# Patient Record
Sex: Female | Born: 1935 | Race: White | Hispanic: No | Smoking: Never smoker
Health system: Southern US, Community
[De-identification: ages and names within clinical notes are randomized; demographics above are authoritative.]

## PROBLEM LIST (undated history)

## (undated) DIAGNOSIS — I4891 Unspecified atrial fibrillation: Secondary | ICD-10-CM

## (undated) DIAGNOSIS — M1990 Unspecified osteoarthritis, unspecified site: Secondary | ICD-10-CM

## (undated) DIAGNOSIS — I499 Cardiac arrhythmia, unspecified: Secondary | ICD-10-CM

## (undated) DIAGNOSIS — Z96649 Presence of unspecified artificial hip joint: Secondary | ICD-10-CM

## (undated) DIAGNOSIS — E7800 Pure hypercholesterolemia, unspecified: Secondary | ICD-10-CM

## (undated) DIAGNOSIS — I890 Lymphedema, not elsewhere classified: Secondary | ICD-10-CM

## (undated) DIAGNOSIS — I639 Cerebral infarction, unspecified: Secondary | ICD-10-CM

## (undated) DIAGNOSIS — I509 Heart failure, unspecified: Secondary | ICD-10-CM

## (undated) DIAGNOSIS — B029 Zoster without complications: Secondary | ICD-10-CM

## (undated) DIAGNOSIS — I10 Essential (primary) hypertension: Secondary | ICD-10-CM

## (undated) HISTORY — DX: Essential (primary) hypertension: I10

## (undated) HISTORY — DX: Pure hypercholesterolemia, unspecified: E78.00

## (undated) HISTORY — DX: Cerebral infarction, unspecified: I63.9

## (undated) HISTORY — DX: Cardiac arrhythmia, unspecified: I49.9

## (undated) HISTORY — DX: Zoster without complications: B02.9

## (undated) HISTORY — DX: Unspecified osteoarthritis, unspecified site: M19.90

## (undated) HISTORY — DX: Lymphedema, not elsewhere classified: I89.0

---

## 2014-04-14 ENCOUNTER — Emergency Department: Payer: Self-pay

## 2014-04-14 LAB — BASIC METABOLIC PANEL
Anion Gap: 7 (ref 7–16)
BUN: 19 mg/dL — AB (ref 7–18)
CO2: 30 mmol/L (ref 21–32)
Calcium, Total: 9.2 mg/dL (ref 8.5–10.1)
Chloride: 102 mmol/L (ref 98–107)
Creatinine: 1.2 mg/dL (ref 0.60–1.30)
EGFR (African American): 47 — ABNORMAL LOW
EGFR (Non-African Amer.): 41 — ABNORMAL LOW
GLUCOSE: 131 mg/dL — AB (ref 65–99)
Osmolality: 282 (ref 275–301)
POTASSIUM: 3.7 mmol/L (ref 3.5–5.1)
Sodium: 139 mmol/L (ref 136–145)

## 2014-04-14 LAB — CBC
HCT: 44.8 % (ref 35.0–47.0)
HGB: 14.3 g/dL (ref 12.0–16.0)
MCH: 29 pg (ref 26.0–34.0)
MCHC: 32 g/dL (ref 32.0–36.0)
MCV: 90 fL (ref 80–100)
Platelet: 206 10*3/uL (ref 150–440)
RBC: 4.96 10*6/uL (ref 3.80–5.20)
RDW: 15.1 % — ABNORMAL HIGH (ref 11.5–14.5)
WBC: 7.4 10*3/uL (ref 3.6–11.0)

## 2014-04-14 LAB — TROPONIN I: Troponin-I: <0.02 ng/mL

## 2014-04-14 LAB — PRO B NATRIURETIC PEPTIDE: B-Type Natriuretic Peptide: 950 pg/mL — ABNORMAL HIGH (ref 0–450)

## 2014-04-14 LAB — PROTIME-INR
INR: 2.2
PROTHROMBIN TIME: 23.6 s — AB (ref 11.5–14.7)

## 2014-04-19 ENCOUNTER — Emergency Department: Payer: Self-pay | Admitting: Emergency Medicine

## 2014-04-19 LAB — BASIC METABOLIC PANEL
ANION GAP: 7 (ref 7–16)
BUN: 24 mg/dL — ABNORMAL HIGH (ref 7–18)
Calcium, Total: 9.1 mg/dL (ref 8.5–10.1)
Chloride: 103 mmol/L (ref 98–107)
Co2: 31 mmol/L (ref 21–32)
Creatinine: 1 mg/dL (ref 0.60–1.30)
EGFR (African American): 59 — ABNORMAL LOW
GFR CALC NON AF AMER: 51 — AB
Glucose: 104 mg/dL — ABNORMAL HIGH (ref 65–99)
OSMOLALITY: 286 (ref 275–301)
Potassium: 3.8 mmol/L (ref 3.5–5.1)
SODIUM: 141 mmol/L (ref 136–145)

## 2014-04-19 LAB — CBC
HCT: 44.8 % (ref 35.0–47.0)
HGB: 14.5 g/dL (ref 12.0–16.0)
MCH: 29.2 pg (ref 26.0–34.0)
MCHC: 32.3 g/dL (ref 32.0–36.0)
MCV: 91 fL (ref 80–100)
Platelet: 200 10*3/uL (ref 150–440)
RBC: 4.95 10*6/uL (ref 3.80–5.20)
RDW: 15.3 % — ABNORMAL HIGH (ref 11.5–14.5)
WBC: 7 10*3/uL (ref 3.6–11.0)

## 2014-04-19 LAB — PROTIME-INR
INR: 3
Prothrombin Time: 29.9 secs — ABNORMAL HIGH (ref 11.5–14.7)

## 2014-04-19 LAB — TROPONIN I

## 2014-04-24 ENCOUNTER — Ambulatory Visit: Payer: Self-pay | Admitting: Family Medicine

## 2014-09-29 ENCOUNTER — Observation Stay: Payer: Self-pay | Admitting: Specialist

## 2014-09-29 LAB — PROTIME-INR
INR: 2.4
Prothrombin Time: 25.9 secs — ABNORMAL HIGH (ref 11.5–14.7)

## 2014-09-29 LAB — BASIC METABOLIC PANEL
Anion Gap: 10 (ref 7–16)
BUN: 38 mg/dL — ABNORMAL HIGH (ref 7–18)
CREATININE: 1.32 mg/dL — AB (ref 0.60–1.30)
Calcium, Total: 9.1 mg/dL (ref 8.5–10.1)
Chloride: 100 mmol/L (ref 98–107)
Co2: 28 mmol/L (ref 21–32)
EGFR (Non-African Amer.): 40 — ABNORMAL LOW
GFR CALC AF AMER: 49 — AB
Glucose: 103 mg/dL — ABNORMAL HIGH (ref 65–99)
Osmolality: 285 (ref 275–301)
Potassium: 3.2 mmol/L — ABNORMAL LOW (ref 3.5–5.1)
SODIUM: 138 mmol/L (ref 136–145)

## 2014-09-29 LAB — CBC WITH DIFFERENTIAL/PLATELET
Basophil #: 0 10*3/uL (ref 0.0–0.1)
Basophil %: 0.6 %
EOS ABS: 0.1 10*3/uL (ref 0.0–0.7)
EOS PCT: 0.7 %
HCT: 43.4 % (ref 35.0–47.0)
HGB: 14.1 g/dL (ref 12.0–16.0)
LYMPHS PCT: 29.1 %
Lymphocyte #: 2.5 10*3/uL (ref 1.0–3.6)
MCH: 29.1 pg (ref 26.0–34.0)
MCHC: 32.3 g/dL (ref 32.0–36.0)
MCV: 90 fL (ref 80–100)
Monocyte #: 0.8 x10 3/mm (ref 0.2–0.9)
Monocyte %: 9 %
NEUTROS PCT: 60.6 %
Neutrophil #: 5.1 10*3/uL (ref 1.4–6.5)
Platelet: 233 10*3/uL (ref 150–440)
RBC: 4.83 10*6/uL (ref 3.80–5.20)
RDW: 14.7 % — ABNORMAL HIGH (ref 11.5–14.5)
WBC: 8.4 10*3/uL (ref 3.6–11.0)

## 2014-09-29 LAB — URINALYSIS, COMPLETE
BLOOD: NEGATIVE
Bilirubin,UR: NEGATIVE
Glucose,UR: NEGATIVE mg/dL (ref 0–75)
Leukocyte Esterase: NEGATIVE
NITRITE: POSITIVE
PROTEIN: NEGATIVE
Ph: 6 (ref 4.5–8.0)
RBC,UR: 3 /HPF (ref 0–5)
SPECIFIC GRAVITY: 1.011 (ref 1.003–1.030)

## 2014-09-29 LAB — CK TOTAL AND CKMB (NOT AT ARMC)
CK, TOTAL: 48 U/L (ref 26–192)
CK, Total: 44 U/L (ref 26–192)
CK, Total: 40 U/L (ref 26–192)
CK-MB: 1.7 ng/mL (ref 0.5–3.6)
CK-MB: 2.3 ng/mL (ref 0.5–3.6)
CK-MB: 2.1 ng/mL (ref 0.5–3.6)

## 2014-09-29 LAB — TROPONIN I
Troponin-I: <0.02 ng/mL
Troponin-I: <0.02 ng/mL
Troponin-I: 0.02 ng/mL

## 2014-09-30 LAB — BASIC METABOLIC PANEL
ANION GAP: 2 — AB (ref 7–16)
BUN: 26 mg/dL — AB (ref 7–18)
CHLORIDE: 108 mmol/L — AB (ref 98–107)
Calcium, Total: 8.6 mg/dL (ref 8.5–10.1)
Co2: 30 mmol/L (ref 21–32)
Creatinine: 1.07 mg/dL (ref 0.60–1.30)
GFR CALC NON AF AMER: 52 — AB
GLUCOSE: 83 mg/dL (ref 65–99)
OSMOLALITY: 283 (ref 275–301)
Potassium: 3.1 mmol/L — ABNORMAL LOW (ref 3.5–5.1)
Sodium: 140 mmol/L (ref 136–145)

## 2014-09-30 LAB — CBC WITH DIFFERENTIAL/PLATELET
BASOS ABS: 0 10*3/uL (ref 0.0–0.1)
Basophil %: 0.5 %
EOS ABS: 0.1 10*3/uL (ref 0.0–0.7)
Eosinophil %: 1.2 %
HCT: 38.3 % (ref 35.0–47.0)
HGB: 12.7 g/dL (ref 12.0–16.0)
Lymphocyte #: 1.5 10*3/uL (ref 1.0–3.6)
Lymphocyte %: 22 %
MCH: 29.4 pg (ref 26.0–34.0)
MCHC: 33.1 g/dL (ref 32.0–36.0)
MCV: 89 fL (ref 80–100)
MONO ABS: 0.7 x10 3/mm (ref 0.2–0.9)
Monocyte %: 10.6 %
Neutrophil #: 4.4 10*3/uL (ref 1.4–6.5)
Neutrophil %: 65.7 %
Platelet: 205 10*3/uL (ref 150–440)
RBC: 4.3 10*6/uL (ref 3.80–5.20)
RDW: 14.7 % — ABNORMAL HIGH (ref 11.5–14.5)
WBC: 6.7 10*3/uL (ref 3.6–11.0)

## 2014-09-30 LAB — PROTIME-INR
INR: 2.7
Prothrombin Time: 28.3 secs — ABNORMAL HIGH (ref 11.5–14.7)

## 2014-09-30 LAB — MAGNESIUM: Magnesium: 1.8 mg/dL

## 2014-10-01 LAB — POTASSIUM: POTASSIUM: 4.4 mmol/L (ref 3.5–5.1)

## 2014-10-01 LAB — PROTIME-INR
INR: 2.8
Prothrombin Time: 28.9 secs — ABNORMAL HIGH (ref 11.5–14.7)

## 2015-01-10 ENCOUNTER — Ambulatory Visit
Admit: 2015-01-10 | Disposition: A | Discharge status: Home / Self Care | Payer: Self-pay | Attending: Orthopedic Surgery | Admitting: Orthopedic Surgery

## 2015-01-10 ENCOUNTER — Ambulatory Visit: Payer: Self-pay

## 2015-01-10 LAB — CBC
HCT: 39.8 % (ref 35.0–47.0)
HGB: 12.8 g/dL (ref 12.0–16.0)
MCH: 29.3 pg (ref 26.0–34.0)
MCHC: 32.3 g/dL (ref 32.0–36.0)
MCV: 91 fL (ref 80–100)
Platelet: 230 10*3/uL (ref 150–440)
RBC: 4.38 10*6/uL (ref 3.80–5.20)
RDW: 16.3 % — ABNORMAL HIGH (ref 11.5–14.5)
WBC: 7.9 10*3/uL (ref 3.6–11.0)

## 2015-01-10 LAB — URINALYSIS, COMPLETE
BILIRUBIN, UR: NEGATIVE
Blood: NEGATIVE
Glucose,UR: NEGATIVE mg/dL (ref 0–75)
Ketone: NEGATIVE
Nitrite: POSITIVE
PH: 5 (ref 4.5–8.0)
Specific Gravity: 1.026 (ref 1.003–1.030)

## 2015-01-10 LAB — SEDIMENTATION RATE: ERYTHROCYTE SED RATE: 30 mm/h (ref 0–30)

## 2015-01-10 LAB — APTT: ACTIVATED PTT: 42.2 s — AB (ref 23.6–35.9)

## 2015-01-10 LAB — BASIC METABOLIC PANEL
ANION GAP: 11 (ref 7–16)
BUN: 30 mg/dL — ABNORMAL HIGH
CO2: 29 mmol/L
CREATININE: 1.25 mg/dL — AB
Calcium, Total: 9.3 mg/dL
Chloride: 98 mmol/L — ABNORMAL LOW
EGFR (Non-African Amer.): 39 — ABNORMAL LOW
GFR CALC AF AMER: 45 — AB
GLUCOSE: 159 mg/dL — AB
POTASSIUM: 4.1 mmol/L
Sodium: 138 mmol/L

## 2015-01-10 LAB — PROTIME-INR
INR: 1.6
PROTHROMBIN TIME: 19.4 s — AB

## 2015-01-10 LAB — MRSA PCR SCREENING

## 2015-01-21 NOTE — Discharge Summary (Signed)
PATIENT NAME:  Colleen Carson, Colleen M MR#:  [id number] DATE OF BIRTH:  October 09, 1924  DATE OF ADMISSION:  09/29/2014 DATE OF DISCHARGE:  10/01/2014  For a detailed note please the history and physical done on admission with Dr. Luberta MutterKonidena.   DIAGNOSES AT DISCHARGE: As follows, dizziness, likely secondary to orthostatic hypotension and symptomatic bradycardia, history of chronic atrial fibrillation, sinus bradycardia, hypertension, hypokalemia, osteoarthritis.   DIET: The patient is being discharged on a low-sodium diet.   ACTIVITY: As tolerated.   FOLLOWUPKandyce Rud:  Marcus [name], MD, in the next 1 to 2 weeks; also follow-up with Dalia HeadingKenneth A Fath, MD, from cardiology in the next 1 week.   DISCHARGE MEDICATIONS: Hydrochlorothiazide 25 mg daily, aspirin 81 mg daily, amlodipine 5 mg daily, warfarin 2 mg 2 tablets daily in the morning, Centrum multivitamin daily, chondroitin glucosamine supplements 1 capsule daily.   CONSULTANTS DURING THE HOSPITAL COURSE: None.   PERTINENT STUDIES DONE DURING THE HOSPITAL COURSE: Are as follows, a CT scan of the head done without contrast on admission showing age-indeterminate left occipital lobe infarct which is likely remote, no acute infarct, atherosclerosis. An MRI and MRA of the brain showing no acute infarct, chronic left occipital infarct with mild to moderate chronic small ischemic disease, mildly motion degraded MRA without evidence of major intracranial arterial occlusion, or high-grade proximal stenosis.   A carotid duplex done showing no evidence of any hemodynamically significant carotid artery stenosis.   HOSPITAL COURSE: This is an 81 year old female with medical problems as mentioned above, presented to the hospital due to dizziness.  Problem:  1.  Dizziness. On admission, the exact etiology of this dizziness was unclear. The patient underwent a neurologic work-up including a CT head and MRI and MRA of the brain which showed no evidence of acute cerebrovascular  accident, but an old occipital infarct. Her carotid duplex was also negative for any evidence of any hemodynamically significant carotid stenosis. The patient had orthostatic vital signs done which were positive. Therefore it was thought that this this was possibly causing her dizziness. The patient was hydrated with IV fluids and orthostatics were repeated and they have improved. The patient was also noted to be significantly bradycardic with heart rates in the 40s. She was on amiodarone, which was discontinued. The patient was also seen by physical therapy. They recommended home health physical therapy. The most likely cause of the patient's dizziness was a combination of the symptomatic bradycardia and orthostasis, which has improved and patient is being arranged for home health physical therapy services and being discharged home.  2.  Hypokalemia. This was secondary to the hydrochlorothiazide the patient was taking. The patient's potassium has been supplemented and this has resolved.  3.  Bradycardia. This was sinus bradycardia, patient was slightly symptomatic with it, but after getting some intravenous fluids and having her amiodarone discontinued, this has improved. For now the patient has been taken off taken off her amiodarone definitely, will follow up with a  cardiologist, Dr. Lady GaryFath, as an outpatient.  4.  History of chronic atrial fibrillation. The patient remained in sinus rhythm, more likely in sinus bradycardia; therefore, was taken off the amiodarone. She will continue her Coumadin her INR was therapeutic.  5.  History of previous cerebrovascular accident. The patient was maintained on her Coumadin, she will resume that.  6.  Hypertension. The patient will continue on Norvasc and hydrochlorothiazide. The patient is being discharged home with home health physical therapy services.   Time spent on discharge was 40 minutes.  ____________________________ Rolly Pancake. Cherlynn Kaiser,  MD vjs:nt D: 10/05/2014 12:21:32 ET T: 10/05/2014 18:35:55 ET JOB#: [id number]  cc: Rolly Pancake. Cherlynn Kaiser, MD, <Dictator> Kandyce Rud, MD Darlin Priestly. Lady Gary, MD Houston Siren MD ELECTRONICALLY SIGNED 10/17/2014 11:50

## 2015-01-21 NOTE — H&P (Signed)
PATIENT NAME:  Colleen Carson, OREBAUGH MR#:  [id number] DATE OF BIRTH:  1924-10-27  DATE OF ADMISSION:  09/29/2014  PRIMARY CARE PHYSICIAN: Kandyce Rud, MD  EMERGENCY ROOM PHYSICIAN: Su Ley, MD  CHIEF COMPLAINT: Dizziness.   HISTORY OF PRESENT ILLNESS: This patient is an 81 year old female patient with a history of chronic atrial fibrillation and hypertension, who comes in because of dizziness. The patient had a sudden episode of dizziness and she fell into the chair. She did not lose consciousness. The patient initially did not want to go to the hospital, but the daughter made her go Dr. America Brown office. At that office, the patient was shaking and had dizziness, so they decided to get her to the ER. The patient was in the ER triage and was noted to have dizziness and she was shaking, and the daughter was concerned if she had a seizure. Also at this time, she never lost consciousness. The patient was evaluated in the ER and, because of dizziness, she had an initial CAT scan of the head. Head CAT scan showed a left occipital infarct,  which was age indeterminate.   The patient denied any other complaints. Her dizziness is mainly positional. It is more when she moves her head, and she has imbalance because of the dizziness. Did not have weakness in hands or legs. No slurred speech. No headache. No nausea. No vomiting. The patient takes meclizine on and off for dizziness, but because of more worsening of dizziness, the family was concerned to see if she had any stroke.   PAST MEDICAL HISTORY: Significant for chronic atrial fibrillation and hypertension.   ALLERGIES: No known allergies.   SOCIAL HISTORY: No smoking. No drinking. No drugs.   PAST SURGICAL HISTORY: Significant for cataract surgery.   FAMILY HISTORY: Significant for strokes in her sisters.   MEDICATIONS: Amiodarone 200 mg p.o. once a day, amlodipine 5 mg p.o. b.i.d., aspirin 81 mg daily, chondroitin glucosamine 200/250 mg p.o.  daily, hydrochlorothiazide 25 mg p.o. daily, metoprolol succinate 100 p.o. a  half tablet b.i.d., Coumadin 2 tablets once a day. The patient usually takes Coumadin in the morning. She took this Coumadin this morning. She takes hydrochlorothiazide at night. The patient feels dizzy only during the nighttime, and this is according to the patient and her family.   REVIEW OF SYSTEMS:  CONSTITUTIONAL: No fever, but has a lot of chills today.  EYES: No blurred vision. No double vision.  ENT: No tinnitus. No epistaxis. No difficulty swallowing.  RESPIRATIONS: No cough. No wheezing.  CARDIOVASCULAR: No chest pain. No orthopnea. No PND.  GASTROINTESTINAL: No nausea. No vomiting. No abdominal pain.  GENITOURINARY: No dysuria or hematuria.  ENDOCRINE: No polyuria or nocturia.  HEMATOLOGIC: No anemia or easy bruising.  INTEGUMENTARY: No skin rash.  MUSCULOSKELETAL: No joint pain.  NEUROLOGIC: No numbness or weakness. The patient had dizziness today. The patient has no weakness in the hands or legs. The patient is no memory loss. No headache. No migraines. No CVA or TIA.  PSYCHIATRIC: No anxiety or insomnia.   PHYSICAL EXAMINATION:  VITAL SIGNS: Temperature 97.5, heart rate 76, blood pressure 189/93, saturations 99% on room air.  GENERAL: The patient is alert, awake, oriented, elderly female, not in distress, answering questions appropriately.  HEAD: Atraumatic, normocephalic.  EYES: Pupils equal, reacting to light. Extraocular movements are intact. No conjunctivitis. EARS, NOSE, AND THROAT: Hearing is intact. Tympanic membranes are clear. No pharyngeal erythema. Membranes are normal. Dentition is good.  NECK: Thyroid is  not enlarged. Supple. No JVD. No lymphadenopathy. No carotid bruit in the right or left sides.  RESPIRATORY: Clear to auscultation. No wheeze. No rales. Not using accessory muscles of respiration.  CARDIOVASCULAR: Rate is regular. No murmurs. The patient has good pedal pulses and femoral  pulses. No pedal edema.  ABDOMEN: Soft, nontender, nondistended. Bowel sounds present. No organomegaly. No hernias.  MUSCULOSKELETAL: Strength 5/5 in upper and lower extremities. No cyanosis. No kyphosis.  SKIN: No skin rashes. No erythema. No nodules. No induration.  LYMPH NODES: No lymphadenopathy in cervical or axillary regions.  NEUROLOGIC: Cranial nerves II through XII intact. DTRs intact. Sensory intact. The patient now has no dysarthria. No aphasia. No dysphasia. No contractures. Babinski's downgoing. Power 5/5 in upper and lower extremities.  PSYCHIATRIC: The patient is alert and oriented x 3. The patient is cooperative. Memory and judgement are intact.   LABORATORY DATA: CT head shows age-indeterminate left occipital lobe infarct, likely remote. The patient has no acute other infarcts. No acute focal ischemic lesions. The patient has chronic microvascular ischemic changes. WBC 8.4, hemoglobin 14.1, hematocrit 43.4, platelets 233,000. Electrolytes: Sodium is 138, potassium 3.2, chloride 100, bicarbonate 28, BUN 38, creatinine 1.32, glucose is 103. Troponin less than 0.02, INR 2.4. Urinalysis is hazy-colored urine with 9 WBCs, positive nitrates, leukocyte esterase negative, bacteria 3+. EKG shows sinus rhythm at 77 beats per minute.   ASSESSMENT AND PLAN:  1.  An 81 year old female patient with dizziness, likely secondary to possible cystitis. The patient's CT head showed age-undetermined stroke, but she is on Coumadin; unlikely it is acute stroke, but we will get MRI and MRA of the brain, carotid ultrasound, echocardiogram, and also continue neurological checks to evaluate for stroke.  2.  Chronic atrial fibrillation. INR is therapeutic. Continue her on amiodarone, beta blockers and Coumadin.  3.  Mild hypokalemia. Replace the potassium.  4.  Shaking and chills, likely secondary to possible developing infection with possible cystitis. The patient's urine cultures will be followed and   empirically she will be given Rocephin.   5.  The patient's dizziness. She can take meclizine as needed.  6.  If the stroke workup is negative, the patient can be discharged home in the morning.   I discussed this plan with the patient, the patient's daughter, and the patient's sister who are very involved in her care.   TIME SPENT: 55 minutes.    ____________________________ Katha HammingSnehalatha Kentaro Alewine, MD sk:MT D: 09/29/2014 16:30:15 ET T: 09/29/2014 19:24:39 ET JOB#: [id number]  cc: Katha HammingSnehalatha Baden Betsch, MD, <Dictator> Katha HammingSNEHALATHA Gregoire Bennis MD ELECTRONICALLY SIGNED 10/25/2014 17:45

## 2015-01-23 ENCOUNTER — Inpatient Hospital Stay: Payer: Medicare Other

## 2015-01-23 ENCOUNTER — Encounter
Admission: RE | Disposition: A | Discharge status: Skilled Nursing Facility | Payer: Self-pay | Source: Ambulatory Visit | Attending: Orthopedic Surgery

## 2015-01-23 ENCOUNTER — Encounter: Payer: Self-pay | Admitting: Orthopedic Surgery

## 2015-01-23 ENCOUNTER — Inpatient Hospital Stay: Payer: Medicare Other | Admitting: Anesthesiology

## 2015-01-23 ENCOUNTER — Inpatient Hospital Stay
Admission: RE | Admit: 2015-01-23 | Discharge: 2015-01-26 | DRG: 470 | Disposition: A | Discharge status: Skilled Nursing Facility | Payer: Medicare Other | Source: Ambulatory Visit | Attending: Orthopedic Surgery | Admitting: Orthopedic Surgery

## 2015-01-23 DIAGNOSIS — D62 Acute posthemorrhagic anemia: Secondary | ICD-10-CM | POA: Diagnosis not present

## 2015-01-23 DIAGNOSIS — E780 Pure hypercholesterolemia: Secondary | ICD-10-CM | POA: Diagnosis present

## 2015-01-23 DIAGNOSIS — I517 Cardiomegaly: Secondary | ICD-10-CM | POA: Diagnosis present

## 2015-01-23 DIAGNOSIS — Z96649 Presence of unspecified artificial hip joint: Secondary | ICD-10-CM

## 2015-01-23 DIAGNOSIS — Z8619 Personal history of other infectious and parasitic diseases: Secondary | ICD-10-CM | POA: Diagnosis not present

## 2015-01-23 DIAGNOSIS — Z8249 Family history of ischemic heart disease and other diseases of the circulatory system: Secondary | ICD-10-CM | POA: Diagnosis not present

## 2015-01-23 DIAGNOSIS — Z7951 Long term (current) use of inhaled steroids: Secondary | ICD-10-CM | POA: Diagnosis not present

## 2015-01-23 DIAGNOSIS — Z7982 Long term (current) use of aspirin: Secondary | ICD-10-CM

## 2015-01-23 DIAGNOSIS — M1612 Unilateral primary osteoarthritis, left hip: Principal | ICD-10-CM | POA: Diagnosis present

## 2015-01-23 DIAGNOSIS — I890 Lymphedema, not elsewhere classified: Secondary | ICD-10-CM | POA: Diagnosis present

## 2015-01-23 DIAGNOSIS — Z8673 Personal history of transient ischemic attack (TIA), and cerebral infarction without residual deficits: Secondary | ICD-10-CM | POA: Diagnosis not present

## 2015-01-23 DIAGNOSIS — Z791 Long term (current) use of non-steroidal anti-inflammatories (NSAID): Secondary | ICD-10-CM | POA: Diagnosis not present

## 2015-01-23 DIAGNOSIS — I10 Essential (primary) hypertension: Secondary | ICD-10-CM | POA: Diagnosis present

## 2015-01-23 DIAGNOSIS — Z7901 Long term (current) use of anticoagulants: Secondary | ICD-10-CM

## 2015-01-23 DIAGNOSIS — I480 Paroxysmal atrial fibrillation: Secondary | ICD-10-CM | POA: Diagnosis present

## 2015-01-23 DIAGNOSIS — Z09 Encounter for follow-up examination after completed treatment for conditions other than malignant neoplasm: Secondary | ICD-10-CM

## 2015-01-23 HISTORY — PX: TOTAL HIP ARTHROPLASTY: SHX124

## 2015-01-23 HISTORY — DX: Presence of unspecified artificial hip joint: Z96.649

## 2015-01-23 HISTORY — PX: JOINT REPLACEMENT: SHX530

## 2015-01-23 LAB — PROTIME-INR
INR: 0.92
PROTHROMBIN TIME: 12.6 s (ref 11.4–15.0)

## 2015-01-23 LAB — GLUCOSE, CAPILLARY
Glucose-Capillary: 103 mg/dL — ABNORMAL HIGH (ref 70–99)
Glucose-Capillary: 147 mg/dL — ABNORMAL HIGH (ref 70–99)
Glucose-Capillary: 151 mg/dL — ABNORMAL HIGH (ref 70–99)

## 2015-01-23 SURGERY — ARTHROPLASTY, HIP, TOTAL, ANTERIOR APPROACH
Anesthesia: Spinal | Site: Hip | Laterality: Left | Wound class: Clean

## 2015-01-23 MED ORDER — HYDROMORPHONE HCL 1 MG/ML IJ SOLN
INTRAMUSCULAR | Status: AC
  Filled 2015-01-23: qty 1

## 2015-01-23 MED ORDER — FLUTICASONE PROPIONATE 50 MCG/ACT NA SUSP
2.0000 | Freq: Every day | NASAL | Status: DC
  Administered 2015-01-25 – 2015-01-26 (×2): 2 via NASAL
  Filled 2015-01-23: qty 16

## 2015-01-23 MED ORDER — ASPIRIN 81 MG PO TABS
81.0000 mg | ORAL_TABLET | Freq: Every day | ORAL | Status: DC
  Filled 2015-01-23: qty 1

## 2015-01-23 MED ORDER — OXYCODONE HCL 5 MG PO TABS
5.0000 mg | ORAL_TABLET | ORAL | Status: DC | PRN
  Administered 2015-01-23 (×2): 10 mg via ORAL
  Administered 2015-01-23: 5 mg via ORAL
  Filled 2015-01-23: qty 1
  Filled 2015-01-23 (×2): qty 2

## 2015-01-23 MED ORDER — LIDOCAINE HCL (PF) 2 % IJ SOLN
INTRAMUSCULAR | Status: DC | PRN
Start: 2015-01-23 — End: 2015-01-23
  Administered 2015-01-23: 50 mg

## 2015-01-23 MED ORDER — ONDANSETRON HCL 4 MG/2ML IJ SOLN
4.0000 mg | Freq: Once | INTRAMUSCULAR | Status: AC | PRN
  Administered 2015-01-23: 4 mg via INTRAVENOUS

## 2015-01-23 MED ORDER — PROPOFOL INFUSION 10 MG/ML OPTIME
INTRAVENOUS | Status: DC | PRN
  Administered 2015-01-23: 50 ug/kg/min via INTRAVENOUS

## 2015-01-23 MED ORDER — FAMOTIDINE 20 MG PO TABS
20.0000 mg | ORAL_TABLET | Freq: Once | ORAL | Status: AC
  Administered 2015-01-23: 20 mg via ORAL

## 2015-01-23 MED ORDER — LACTATED RINGERS IV SOLN
INTRAVENOUS | Status: DC
  Administered 2015-01-23 (×2): via INTRAVENOUS

## 2015-01-23 MED ORDER — CEFAZOLIN SODIUM-DEXTROSE 2-3 GM-% IV SOLR
2.0000 g | INTRAVENOUS | Status: AC
  Administered 2015-01-23: 2 g via INTRAVENOUS

## 2015-01-23 MED ORDER — BUPIVACAINE-EPINEPHRINE 0.25% -1:200000 IJ SOLN
INTRAMUSCULAR | Status: DC | PRN
  Administered 2015-01-23: 30 mL

## 2015-01-23 MED ORDER — DEXTROSE-NACL 5-0.9 % IV SOLN
INTRAVENOUS | Status: DC
  Administered 2015-01-23 – 2015-01-25 (×4): via INTRAVENOUS

## 2015-01-23 MED ORDER — AMLODIPINE BESYLATE 5 MG PO TABS
5.0000 mg | ORAL_TABLET | Freq: Every day | ORAL | Status: DC
  Administered 2015-01-24 – 2015-01-26 (×3): 5 mg via ORAL
  Filled 2015-01-23 (×3): qty 1

## 2015-01-23 MED ORDER — SENNOSIDES-DOCUSATE SODIUM 8.6-50 MG PO TABS
1.0000 | ORAL_TABLET | Freq: Every evening | ORAL | Status: DC | PRN
  Administered 2015-01-24: 1 via ORAL
  Filled 2015-01-23: qty 1

## 2015-01-23 MED ORDER — FENTANYL CITRATE (PF) 100 MCG/2ML IJ SOLN
INTRAMUSCULAR | Status: DC | PRN
  Administered 2015-01-23: 50 ug via INTRAVENOUS

## 2015-01-23 MED ORDER — BUPIVACAINE-EPINEPHRINE (PF) 0.5% -1:200000 IJ SOLN
INTRAMUSCULAR | Status: DC | PRN
  Administered 2015-01-23: 3 mL

## 2015-01-23 MED ORDER — ONDANSETRON HCL 4 MG/2ML IJ SOLN
4.0000 mg | Freq: Four times a day (QID) | INTRAMUSCULAR | Status: DC | PRN
  Administered 2015-01-23 – 2015-01-24 (×3): 4 mg via INTRAVENOUS
  Filled 2015-01-23 (×3): qty 2

## 2015-01-23 MED ORDER — CEFAZOLIN SODIUM-DEXTROSE 2-3 GM-% IV SOLR
INTRAVENOUS | Status: AC
  Filled 2015-01-23: qty 50

## 2015-01-23 MED ORDER — FAMOTIDINE 20 MG PO TABS
ORAL_TABLET | ORAL | Status: AC
  Filled 2015-01-23: qty 1

## 2015-01-23 MED ORDER — CHLORHEXIDINE GLUCONATE 4 % EX LIQD: 60.0000 mL | Freq: Once | CUTANEOUS | Status: DC

## 2015-01-23 MED ORDER — ADULT MULTIVITAMIN W/MINERALS CH
ORAL_TABLET | Freq: Every day | ORAL | Status: DC
  Administered 2015-01-23 – 2015-01-26 (×4): 1 via ORAL
  Filled 2015-01-23 (×6): qty 1

## 2015-01-23 MED ORDER — WARFARIN SODIUM 3 MG PO TABS
3.0000 mg | ORAL_TABLET | Freq: Every day | ORAL | Status: DC
  Administered 2015-01-23 – 2015-01-26 (×4): 3 mg via ORAL
  Filled 2015-01-23 (×5): qty 1

## 2015-01-23 MED ORDER — LOSARTAN POTASSIUM 50 MG PO TABS
50.0000 mg | ORAL_TABLET | Freq: Every day | ORAL | Status: DC
  Administered 2015-01-24 – 2015-01-26 (×3): 50 mg via ORAL
  Filled 2015-01-23 (×3): qty 1

## 2015-01-23 MED ORDER — NEOMYCIN-POLYMYXIN B GU 40-200000 IR SOLN
Status: AC
  Filled 2015-01-23: qty 4

## 2015-01-23 MED ORDER — ONDANSETRON HCL 4 MG/2ML IJ SOLN
INTRAMUSCULAR | Status: AC
  Filled 2015-01-23: qty 2

## 2015-01-23 MED ORDER — FENTANYL CITRATE (PF) 100 MCG/2ML IJ SOLN: 25.0000 ug | INTRAMUSCULAR | Status: DC | PRN

## 2015-01-23 MED ORDER — BUPIVACAINE-EPINEPHRINE (PF) 0.25% -1:200000 IJ SOLN
INTRAMUSCULAR | Status: AC
  Filled 2015-01-23: qty 30

## 2015-01-23 MED ORDER — MORPHINE SULFATE 2 MG/ML IJ SOLN
2.0000 mg | INTRAMUSCULAR | Status: DC | PRN
  Administered 2015-01-23: 4 mg via INTRAVENOUS
  Filled 2015-01-23: qty 2

## 2015-01-23 MED ORDER — SIMVASTATIN 20 MG PO TABS
20.0000 mg | ORAL_TABLET | Freq: Every day | ORAL | Status: DC
  Administered 2015-01-23 – 2015-01-25 (×3): 20 mg via ORAL
  Filled 2015-01-23 (×3): qty 1

## 2015-01-23 MED ORDER — DOCUSATE SODIUM 100 MG PO CAPS
100.0000 mg | ORAL_CAPSULE | Freq: Two times a day (BID) | ORAL | Status: DC
  Administered 2015-01-23 – 2015-01-25 (×6): 100 mg via ORAL
  Filled 2015-01-23 (×6): qty 1

## 2015-01-23 MED ORDER — MORPHINE SULFATE 2 MG/ML IJ SOLN
2.0000 mg | INTRAMUSCULAR | Status: DC | PRN
  Administered 2015-01-23: 2 mg via INTRAVENOUS
  Filled 2015-01-23: qty 1

## 2015-01-23 MED ORDER — CEFAZOLIN SODIUM-DEXTROSE 2-3 GM-% IV SOLR
2.0000 g | Freq: Three times a day (TID) | INTRAVENOUS | Status: AC
  Administered 2015-01-23 – 2015-01-24 (×3): 2 g via INTRAVENOUS
  Filled 2015-01-23 (×3): qty 50

## 2015-01-23 MED ORDER — FLEET ENEMA 7-19 GM/118ML RE ENEM: 1.0000 | ENEMA | Freq: Once | RECTAL | Status: AC | PRN

## 2015-01-23 MED ORDER — TRIAMCINOLONE ACETONIDE 0.5 % EX CREA
1.0000 "application " | TOPICAL_CREAM | Freq: Two times a day (BID) | CUTANEOUS | Status: DC
  Administered 2015-01-25 – 2015-01-26 (×2): 1 via TOPICAL
  Filled 2015-01-23: qty 15

## 2015-01-23 MED ORDER — ASPIRIN EC 81 MG PO TBEC
81.0000 mg | DELAYED_RELEASE_TABLET | Freq: Every day | ORAL | Status: DC
  Administered 2015-01-23 – 2015-01-26 (×4): 81 mg via ORAL
  Filled 2015-01-23 (×3): qty 1

## 2015-01-23 MED ORDER — HYDROMORPHONE HCL 1 MG/ML IJ SOLN
0.5000 mg | Freq: Once | INTRAMUSCULAR | Status: AC
  Administered 2015-01-23: 0.5 mg via INTRAVENOUS

## 2015-01-23 MED ORDER — BISACODYL 10 MG RE SUPP
10.0000 mg | Freq: Every day | RECTAL | Status: DC | PRN
  Administered 2015-01-25: 10 mg via RECTAL
  Filled 2015-01-23: qty 1

## 2015-01-23 SURGICAL SUPPLY — 46 items
BLADE SAW 1/2 (BLADE) ×2 IMPLANT
BNDG COHESIVE 6X5 TAN STRL LF (GAUZE/BANDAGES/DRESSINGS) ×2 IMPLANT
CANISTER SUCT 1200ML W/VALVE (MISCELLANEOUS) ×2 IMPLANT
CANISTER SUCT 3000ML (MISCELLANEOUS) ×2 IMPLANT
CAPT HIP TOTAL 3 ×2 IMPLANT
CATH FOL LEG HOLDER (MISCELLANEOUS) ×2 IMPLANT
CATH TRAY 16F METER LATEX (MISCELLANEOUS) ×2 IMPLANT
CHLORAPREP W/TINT 26ML (MISCELLANEOUS) ×2 IMPLANT
DRAPE C-ARM XRAY 36X54 (DRAPES) ×2 IMPLANT
DRAPE INCISE IOBAN 66X60 STRL (DRAPES) ×2 IMPLANT
DRAPE POUCH INSTRU U-SHP 10X18 (DRAPES) ×2 IMPLANT
DRAPE SHEET LG 3/4 BI-LAMINATE (DRAPES) ×6 IMPLANT
DRAPE TABLE BACK 80X90 (DRAPES) ×2 IMPLANT
ELECT BLADE 6.5 EXT (BLADE) ×2 IMPLANT
GAUZE SPONGE 4X4 12PLY STRL (GAUZE/BANDAGES/DRESSINGS) ×2 IMPLANT
GAUZE XEROFORM 4X4 STRL (GAUZE/BANDAGES/DRESSINGS) ×2 IMPLANT
GLOVE BIOGEL PI IND STRL 9 (GLOVE) ×1 IMPLANT
GLOVE BIOGEL PI INDICATOR 9 (GLOVE) ×1
GLOVE SURG ORTHO 9.0 STRL STRW (GLOVE) ×2 IMPLANT
GOWN SPECIALTY ULTRA XL (MISCELLANEOUS) ×2 IMPLANT
GOWN STRL REUS W/ TWL LRG LVL3 (GOWN DISPOSABLE) ×1 IMPLANT
GOWN STRL REUS W/TWL LRG LVL3 (GOWN DISPOSABLE) ×1
HEMOVAC 400CC 10FR (MISCELLANEOUS) IMPLANT
HOOD PEEL AWAY FACE SHEILD DIS (HOOD) ×2 IMPLANT
MAT BLUE FLOOR 46X72 FLO (MISCELLANEOUS) ×2 IMPLANT
MEDACTA ×8 IMPLANT
NDL SAFETY 18GX1.5 (NEEDLE) ×2 IMPLANT
NEEDLE SPNL 18GX3.5 QUINCKE PK (NEEDLE) ×2 IMPLANT
NS IRRIG 1000ML POUR BTL (IV SOLUTION) ×2 IMPLANT
PACK HIP COMPR (MISCELLANEOUS) ×2 IMPLANT
PAD ABD DERMACEA PRESS 5X9 (GAUZE/BANDAGES/DRESSINGS) ×2 IMPLANT
STAPLER SKIN PROX 35W (STAPLE) ×2 IMPLANT
STRAP SAFETY BODY (MISCELLANEOUS) ×2 IMPLANT
SUT DVC 2 QUILL PDO  T11 36X36 (SUTURE) ×1
SUT DVC 2 QUILL PDO T11 36X36 (SUTURE) ×1 IMPLANT
SUT DVC QUILL MONODERM 30X30 (SUTURE) ×2 IMPLANT
SUT ETHIBOND NAB CT1 #1 30IN (SUTURE) ×2 IMPLANT
SUT SILK 0 (SUTURE)
SUT SILK 0 30XBRD TIE 6 (SUTURE) IMPLANT
SUT VIC AB 1 CT1 36 (SUTURE) ×2 IMPLANT
SYR 20CC LL (SYRINGE) ×2 IMPLANT
SYR 30ML LL (SYRINGE) ×2 IMPLANT
TAPE MICROFOAM 4IN (TAPE) ×2 IMPLANT
TUBE KAMVAC SUCTION (TUBING) IMPLANT
WATER STERILE IRR 1000ML POUR (IV SOLUTION) ×2 IMPLANT
WAVEGUIDE EIGR WIDE/ANG 102521 (MISCELLANEOUS) IMPLANT

## 2015-01-23 NOTE — H&P (Signed)
Reviewed paper H+P, will be scanned into chart.

## 2015-01-23 NOTE — Anesthesia Procedure Notes (Signed)
Spinal Patient location during procedure: OR Start time: 01/23/2015 7:50 AM End time: 01/23/2015 8:00 AM Staffing Anesthesiologist: Elijio MilesVAN STAVEREN, GIJSBERTUS F Resident/CRNA: Colleen Carson, Colleen Carson Performed by: resident/CRNA  Preanesthetic Checklist Completed: patient identified, site marked, surgical consent, pre-op evaluation, timeout performed, IV checked, risks and benefits discussed and monitors and equipment checked Spinal Block Patient position: sitting Prep: Betadine Patient monitoring: heart rate, continuous pulse ox and blood pressure Approach: midline Location: L3-4 Injection technique: single-shot Needle Needle type: Whitacre  Needle gauge: 24 G Needle length: 5 cm Assessment Sensory level: T6

## 2015-01-23 NOTE — Anesthesia Preprocedure Evaluation (Addendum)
Anesthesia Evaluation  Patient identified by MRN, date of birth, ID band  Reviewed: reviewed documented beta blocker date and time   History of Anesthesia Complications Negative for: history of anesthetic complications  Airway Mallampati: II       Dental no notable dental hx.    Pulmonary neg pulmonary ROS,    Pulmonary exam normal       Cardiovascular hypertension, Pt. on home beta blockers + dysrhythmias Atrial Fibrillation Rhythm:Irregular     Neuro/Psych negative neurological ROS  negative psych ROS   GI/Hepatic negative GI ROS, Neg liver ROS,   Endo/Other  negative endocrine ROS  Renal/GU negative Renal ROS  negative genitourinary   Musculoskeletal  (+) Arthritis -, Osteoarthritis,    Abdominal Normal abdominal exam  (+)   Peds negative pediatric ROS (+)  Hematology negative hematology ROS (+)   Anesthesia Other Findings   Reproductive/Obstetrics negative OB ROS                            Anesthesia Physical Anesthesia Plan  ASA: II  Anesthesia Plan: Spinal   Post-op Pain Management:    Induction: Intravenous  Airway Management Planned: Nasal Cannula  Additional Equipment:   Intra-op Plan:   Post-operative Plan:   Informed Consent: I have reviewed the patients History and Physical, chart, labs and discussed the procedure including the risks, benefits and alternatives for the proposed anesthesia with the patient or authorized representative who has indicated his/her understanding and acceptance.     Plan Discussed with: CRNA and Surgeon  Anesthesia Plan Comments:         Anesthesia Quick Evaluation

## 2015-01-23 NOTE — Anesthesia Postprocedure Evaluation (Signed)
  Anesthesia Post-op Note  Patient: Colleen Carson  Procedure(s) Performed: Procedure(s): TOTAL HIP ARTHROPLASTY ANTERIOR APPROACH (Left)  Anesthesia type:Spinal  Patient location: PACU  Post pain: Pain level controlled  Post assessment: Post-op Vital signs reviewed, Patient's Cardiovascular Status Stable, Respiratory Function Stable, Patent Airway and No signs of Nausea or vomiting  Post vital signs: Reviewed and stable  Last Vitals:  Filed Vitals:   01/23/15 0922  BP: 100/46  Pulse: 66  Temp: 35.9 C  Resp: 12    Level of consciousness: awake, alert  and patient cooperative  Complications: No apparent anesthesia complications

## 2015-01-23 NOTE — Transfer of Care (Signed)
Immediate Anesthesia Transfer of Care Note  Patient: Colleen Carson  Procedure(s) Performed: Procedure(s): TOTAL HIP ARTHROPLASTY ANTERIOR APPROACH (Left)  Patient Location: PACU  Anesthesia Type:Spinal  Level of Consciousness: awake, alert  and oriented  Airway & Oxygen Therapy: Patient connected to face mask oxygen  Post-op Assessment: Report given to RN and Post -op Vital signs reviewed and stable  Post vital signs: Reviewed and stable  Last Vitals:  Filed Vitals:   01/23/15 0922  BP: 100/46  Pulse: 66  Temp: 35.9 C  Resp: 12    Complications: No apparent anesthesia complications

## 2015-01-23 NOTE — Brief Op Note (Signed)
01/23/2015  9:39 AM  PATIENT:  Colleen Carson  81 y.o. female  PRE-OPERATIVE DIAGNOSIS:  OSTEOARTHRITIS LEFT HIP  POST-OPERATIVE DIAGNOSIS:  OSTEOARTHRITIS LEFT HIP  PROCEDURE:  Procedure(s): TOTAL HIP ARTHROPLASTY ANTERIOR APPROACH (Left)  SURGEON:  Surgeon(s) and Role:    * Kennedy BuckerMichael Cornel Werber, MD - Primary  PHYSICIAN ASSISTANT:   ASSISTANTS: none   ANESTHESIA:   spinal  EBL:  Total I/O In: 1100 [I.V.:1100] Out: 200 [Urine:100; Blood:100]  BLOOD ADMINISTERED:none  DRAINS: (2) Hemovact drain(s) in the thigh with  Suction Open   LOCAL MEDICATIONS USED:  MARCAINE     SPECIMEN:  Source of Specimen:  femoral head  DISPOSITION OF SPECIMEN:  PATHOLOGY  COUNTS:  YES  TOURNIQUET:  * No tourniquets in log *  DICTATION: .Dragon Dictation  PLAN OF CARE: Admit to inpatient   PATIENT DISPOSITION:  PACU - hemodynamically stable.   Delay start of Pharmacological VTE agent (>24hrs) due to surgical blood loss or risk of bleeding: no

## 2015-01-23 NOTE — Progress Notes (Signed)
Called Dr. Rosita KeaMenz for pain level 10/10  New orders received for dilaudid and morphine.  Telephone 5/3 16:30

## 2015-01-23 NOTE — Op Note (Signed)
01/23/2015  10:10 AM  PATIENT:  Colleen PrimeHilda M Shults  81 y.o. female  PRE-OPERATIVE DIAGNOSIS:  OSTEOARTHRITIS LEFT HIP  POST-OPERATIVE DIAGNOSIS:  OSTEOARTHRITIS LEFT HIP  PROCEDURE:  Procedure(s): TOTAL HIP ARTHROPLASTY ANTERIOR APPROACH (Left)  SURGEON:  Surgeon(s) and Role:    * Kennedy BuckerMichael Kaniyah Lisby, MD - Primary  PHYSICIAN ASSISTANT:   ASSISTANTS: none   ANESTHESIA:   spinal  EBL:  Total I/O In: 1100 [I.V.:1100] Out: 200 [Urine:100; Blood:100]  BLOOD ADMINISTERED:none  DRAINS: Hemovac  LOCAL MEDICATIONS USED:  MARCAINE     SPECIMEN:  Source of Specimen:  Femoral head  DISPOSITION OF SPECIMEN:  PATHOLOGY  COUNTS:  YES  TOURNIQUET:  * No tourniquets in log *  DICTATION: The patient was brought to the operating room and after spinal anesthesia was obtained she was placed on the operative table with the left foot and the Medacta attachment right foot on a well-padded table. C-arm was brought in and preop template x-ray taken. After prepping and draping in usual sterile fashion appropriate patient identification and timeout procedures were completed. Anterior approach to the hip was obtained and centered over the greater trochanter and TFL muscle. The subcutaneous tissue was incised hemostasis being achieved by electrocautery. TFL fascia was incised and the muscle retracted laterally deep retractor placed. The lateral femoral circumflex vessels were identified and ligated. The anterior capsule was exposed and a capsulotomy performed. The neck was identified and a femoral neck cut carried out with a saw. The head was removed without difficulty and showed sclerotic femoral head and acetabulum. Reaming was carried out to 50 mm and a 52 mm cup trial gave appropriate tightness to the acetabular component a 52 mm Mpact DM cup was impacted into position. The leg was then externally rotated and ischiofemoral and patellofemoral releases carried out. The femur was sequentially broached to a size 3 size  3 trials were placed and the final components chosen. The 3 AMIS stem was inserted along with a M 28 mm head and 52 mm liner. The hip was reduced and was stable the wound was thoroughly irrigated with a dilute Betadine solution. The deep fascia view. Using a heavy Quill after infiltration of 30 cc of quarter percent Sensorcaine with epinephrine. Subcutaneous drains were then inserted to a Quill to close the skin with skin staples Xeroform 4 x 4's ABDs and tape patient was sent to recovery in stable condition complications none specimen removed femoral head issue comes stable. iMplants: Medacta Amis 3 stem with an M 28 mm head, liner and Mpact 52 mm DM cup   PLAN OF CARE: Admit to inpatient   PATIENT DISPOSITION:  PACU - hemodynamically stable.   Delay start of Pharmacological VTE agent (>24hrs) due to surgical blood loss or risk of bleeding: no

## 2015-01-24 LAB — CBC
HEMATOCRIT: 30.3 % — AB (ref 35.0–47.0)
Hemoglobin: 9.9 g/dL — ABNORMAL LOW (ref 12.0–16.0)
MCH: 29.6 pg (ref 26.0–34.0)
MCHC: 32.5 g/dL (ref 32.0–36.0)
MCV: 91.1 fL (ref 80.0–100.0)
Platelets: 183 10*3/uL (ref 150–440)
RBC: 3.33 MIL/uL — ABNORMAL LOW (ref 3.80–5.20)
RDW: 15.8 % — AB (ref 11.5–14.5)
WBC: 9.8 10*3/uL (ref 3.6–11.0)

## 2015-01-24 LAB — BASIC METABOLIC PANEL
ANION GAP: 7 (ref 5–15)
BUN: 16 mg/dL (ref 6–20)
CALCIUM: 8.5 mg/dL — AB (ref 8.9–10.3)
CO2: 30 mmol/L (ref 22–32)
Chloride: 103 mmol/L (ref 101–111)
Creatinine, Ser: 0.84 mg/dL (ref 0.44–1.00)
GFR calc Af Amer: >60 mL/min (ref >60)
Glucose, Bld: 153 mg/dL — ABNORMAL HIGH (ref 65–99)
Potassium: 3.9 mmol/L (ref 3.5–5.1)
SODIUM: 140 mmol/L (ref 135–145)

## 2015-01-24 LAB — GLUCOSE, CAPILLARY
GLUCOSE-CAPILLARY: 152 mg/dL — AB (ref 70–99)
Glucose-Capillary: 145 mg/dL — ABNORMAL HIGH (ref 70–99)

## 2015-01-24 LAB — PROTIME-INR
INR: 0.97
PROTHROMBIN TIME: 13.1 s (ref 11.4–15.0)

## 2015-01-24 MED ORDER — FE FUMARATE-B12-VIT C-FA-IFC PO CAPS
1.0000 | ORAL_CAPSULE | Freq: Two times a day (BID) | ORAL | Status: DC
  Administered 2015-01-24 – 2015-01-26 (×5): 1 via ORAL
  Filled 2015-01-24 (×5): qty 1

## 2015-01-24 MED ORDER — HYDROCODONE-ACETAMINOPHEN 5-325 MG PO TABS
1.0000 | ORAL_TABLET | Freq: Four times a day (QID) | ORAL | Status: DC | PRN
  Administered 2015-01-24 – 2015-01-26 (×5): 1 via ORAL
  Filled 2015-01-24 (×5): qty 1

## 2015-01-24 MED ORDER — ONDANSETRON 4 MG PO TBDP
8.0000 mg | ORAL_TABLET | Freq: Three times a day (TID) | ORAL | Status: DC | PRN
  Administered 2015-01-24: 8 mg via ORAL
  Filled 2015-01-24: qty 2

## 2015-01-24 NOTE — Progress Notes (Signed)
Chaplain met with patient and her son, concern with life changes to patient's health were discussed.  Loralyn Freshwater D. Alroy Dust [phone] @ 11:am    01/24/15 1100  Clinical Encounter Type  Visited With Patient and family together  Visit Type Initial  Referral From (routine chaplain visit)  Consult/Referral To [facility] (Comment) (respect family choices)  Stress Factors  Patient Stress Factors Health changes  Family Stress Factors Health changes

## 2015-01-24 NOTE — Progress Notes (Signed)
Pt is confused and disoriented to situation, calling out for her daughter. Lethargic after pain medication administered. Agitated and nauseated. Medicated for nausea with some relief. MD paged, awaiting response. Daughter Colleen Carson was called and will come as soon as possible. VSS on 2L O2. IVF infusing without difficulty. Will continue to monitor.

## 2015-01-24 NOTE — Progress Notes (Addendum)
Dr. Menz notifRosita Keaied of glycemic control ssi protocol order, pts fingerstick in the 140 range. Per Dr. Rosita KeaMenz d/c protocol and keep on diabetic diet.

## 2015-01-24 NOTE — Progress Notes (Signed)
Physical Therapy Treatment Patient Details Name: Colleen DawleyHilda M Carson MRN: [id number] DOB: 11-01-1924 Today's Date: 01/24/2015    History of Present Illness Pt is a pleasant 81 year old female who was admitted for L THR.     PT Comments    Pt tolerated treatment well this session with improved mobility requiring less assist for ambulation. Pt able to follow commands for sequencing. Pt still limited by fatigue and pain at this time. Good endurance with hep. Pt performed all mobility while on room air with sats WNL. RN aware.  Follow Up Recommendations  SNF     Equipment Recommendations  None recommended by PT    Recommendations for Other Services       Precautions / Restrictions Precautions Precautions: Anterior Hip Precaution Booklet Issued: Yes (comment) Precaution Comments: anterior- educated patient/family Restrictions Weight Bearing Restrictions: Yes LLE Weight Bearing: Weight bearing as tolerated    Mobility  Bed Mobility Overal bed mobility: +2 for physical assistance Bed Mobility: Sit to Supine     Supine to sit: +2 for physical assistance Sit to supine: +2 for physical assistance   General bed mobility comments:  (assist for B UE/LE in bed and scooting up towards HOB)  Transfers Overall transfer level: Needs assistance Equipment used: Rolling walker (2 wheeled) Transfers: Sit to/from Stand Sit to Stand: +2 physical assistance         General transfer comment: Pt able to perform transfer with safe technique and min assist. Pt able to follow commands for correct technique.  Ambulation/Gait Ambulation/Gait assistance: Max assist;+2 physical assistance Ambulation Distance (Feet): 5 Feet Assistive device: Rolling walker (2 wheeled)       General Gait Details: slow, step to gait pattern performed with heavy cues for WB on L LE.    Stairs            Wheelchair Mobility    Modified Rankin (Stroke Patients Only)       Balance Overall balance  assessment: Needs assistance Sitting-balance support: Bilateral upper extremity supported Sitting balance-Leahy Scale: Fair     Standing balance support: Bilateral upper extremity supported Standing balance-Leahy Scale: Poor                      Cognition Arousal/Alertness: Awake/alert Behavior During Therapy: WFL for tasks assessed/performed;Flat affect Overall Cognitive Status: Within Functional Limits for tasks assessed                      Exercises Other Exercises Other Exercises: Pt performed supine ther-ex for L LE including ankle pumps, quad sets, glut sets, (supervision), SAQ with min assist, and hip abd/add with max assist. Further ther-ex limited secondary to pain. All ther-ex performed x 10 reps with cues for correct technique.    General Comments        Pertinent Vitals/Pain Pain Assessment: 0-10 Pain Score: 7  Pain Descriptors / Indicators: Aching Pain Intervention(s): Limited activity within patient's tolerance;Premedicated before session    Home Living Family/patient expects to be discharged to:: Skilled nursing facility Living Arrangements: Children Available Help at Discharge: Available 24 hours/day Type of Home: House Home Access: Stairs to enter Entrance Stairs-Rails: Left Home Layout: One level Home Equipment: Environmental consultantWalker - 2 wheels;Walker - 4 wheels;Toilet riser;Grab bars - toilet;Grab bars - tub/shower;Hospital bed      Prior Function Level of Independence: Independent          PT Goals (current goals can now be found in the care plan section)  Acute Rehab PT Goals Patient Stated Goal: to go to rehab PT Goal Formulation: With patient/family Time For Goal Achievement: 02/07/15 Potential to Achieve Goals: Good Progress towards PT goals: Progressing toward goals    Frequency  BID    PT Plan Current plan remains appropriate    Co-evaluation PT/OT/SLP Co-Evaluation/Treatment: Yes Reason for Co-Treatment: Complexity of the  patient's impairments (multi-system involvement);For patient/therapist safety PT goals addressed during session: Proper use of DME;Mobility/safety with mobility;Strengthening/ROM OT goals addressed during session: ADL's and self-care;Proper use of Adaptive equipment and DME     End of Session Equipment Utilized During Treatment: Gait belt Activity Tolerance: Patient limited by fatigue;Patient limited by lethargy;Patient limited by pain Patient left: in bed;with bed alarm set;with call bell/phone within reach;with family/visitor present     Time: [id number] PT Time Calculation (min) (ACUTE ONLY): 31 min  Charges:  $Gait Training: 8-22 mins $Therapeutic Exercise: 8-22 mins                    G Codes:      Colleen Carson 01/24/2015, 1:54 PM

## 2015-01-24 NOTE — Clinical Social Work Placement (Signed)
   CLINICAL SOCIAL WORK PLACEMENT  NOTE  Date:  01/24/2015  Patient Details  Name: Lynwood DawleyHilda M Morera MRN: [id number] Date of Birth: [birth year]  Clinical Social Work is seeking post-discharge placement for this patient at the Skilled  Nursing Facility level of care (*CSW will initial, date and re-position this form in  chart as items are completed):  Yes   Patient/family provided with [facility] Clinical Social Work Department's list of facilities offering this level of care within the geographic area requested by the patient (or if unable, by the patient's family).  Yes   Patient/family informed of their freedom to choose among providers that offer the needed level of care, that participate in Medicare, Medicaid or managed care program needed by the patient, have an available bed and are willing to accept the patient.  Yes   Patient/family informed of [facility]'s ownership interest in [facility] and [facility], as well as of the fact that they are under no obligation to receive care at these facilities.  PASRR submitted to EDS on 01/24/15     PASRR number received on 01/24/15     Existing PASRR number confirmed on       FL2 transmitted to all facilities in geographic area requested by pt/family on 01/24/15     FL2 transmitted to all facilities within larger geographic area on       Patient informed that his/her managed care company has contracts with or will negotiate with certain facilities, including the following:            Patient/family informed of bed offers received.  Patient chooses bed at       Physician recommends and patient chooses bed at      Patient to be transferred to   on  .  Patient to be transferred to facility by       Patient family notified on   of transfer.  Name of family member notified:        PHYSICIAN Please sign FL2     Additional Comment:    _______________________________________________ Haig ProphetMorgan, Christerpher Clos G, LCSW 01/24/2015, 11:19  AM

## 2015-01-24 NOTE — Discharge Instructions (Signed)

## 2015-01-24 NOTE — Discharge Summary (Signed)

## 2015-01-24 NOTE — Progress Notes (Signed)
RN spoke with Dr. Rosita KeaMenz via telephone regarding change in mental status. MD order to d/c morphine and oxycodone and to order norco 5/325mg  1-2 tabs Q6hrs PRN. Daughter and son in law at bedside. Will continue to monitor.

## 2015-01-24 NOTE — Progress Notes (Signed)
Daughter requested for foley to be removed. Per verbal order by Dr Rosita KeaMenz, ok to discontinue foley.

## 2015-01-24 NOTE — Evaluation (Signed)
Occupational Therapy Evaluation Patient Details Name: Colleen DawleyHilda M Carson MRN: [id number] DOB: September 04, 1925 Today's Date: 01/24/2015    History of Present Illness Pt is a pleasant 81 year old female who was admitted for L THR.    Clinical Impression   Pt is 81 year old woman s/p L THR(anterior)  who lives at home with her husband who has had a CVA and lives on the bottom floor of a 2 story house and her son and daughter in-law live upstairs.  Her son works from home and daughter in law does not work and are able to provide 24 hour supervision.  She is currently limited in functional ADLs due to pain, decreased ROM, nausea and vomiting.  She requires maximum assist and cues of 2 for bed mobility, sit to stand and transfers.  She was only able to take a few steps today and could not get to chair and needed chair to be brought to her.  She was independent in ADLs prior to surgery and would benefit from continued OT for education in assistive devices, functional mobility, and education for caregivers to allow patient to do as much for herself as possible.     Follow Up Recommendations  SNF    Equipment Recommendations  Tub/shower seat;Other (comment) (house is fully accessible per son and daughter in law and has grab bars by toilet and in shower.  Rec a shower chair )    Recommendations for Other Services PT consult     Precautions / Restrictions Precautions Precautions: Anterior Hip Precaution Booklet Issued: No Precaution Comments: anterior- educated patient/family Restrictions Weight Bearing Restrictions: Yes LLE Weight Bearing: Weight bearing as tolerated      Mobility Bed Mobility Overal bed mobility: +2 for physical assistance Bed Mobility: Supine to Sit     Supine to sit: +2 for physical assistance     General bed mobility comments: +2 assist for scooting towards EOB and supine->sit. Once seated at EOB, pt able to sit with cga. Pt complains of nausea with  activity.  Transfers Overall transfer level: Needs assistance Equipment used: Rolling walker (2 wheeled) Transfers: Sit to/from Stand Sit to Stand: +2 physical assistance         General transfer comment: Pt unable to fully WB on L LE with knee flexion noted. Heavy cues for WB on rw. Pt fatigues quickly with standing and becomes nauseated. Unable to ambulate at this time requiring increased assist from therapist. Recliner chair pulled under patient.    Balance Overall balance assessment: Needs assistance Sitting-balance support: Bilateral upper extremity supported Sitting balance-Leahy Scale: Fair     Standing balance support: Bilateral upper extremity supported Standing balance-Leahy Scale: Poor                              ADL Overall ADL's : Needs assistance/impaired Eating/Feeding: Independent   Grooming: Wash/dry hands;Wash/dry face;Oral care;Applying deodorant;Brushing hair;Minimal assistance   Upper Body Bathing: Minimal assitance           Lower Body Dressing: Maximal assistance Lower Body Dressing Details (indicate cue type and reason): Pt with dizziness and nauseau and lethargic and needs max assit for LB dressing currently Toilet Transfer: Maximal assistance;+2 for physical assistance;+2 for safety/equipment Toilet Transfer Details (indicate cue type and reason): using FWW           General ADL Comments:  (Pt requires max assist for ADLs currently due to nausea, pain and vomiting )  Vision     Perception     Praxis      Pertinent Vitals/Pain Pain Assessment: 0-10 Pain Score: 7  Pain Descriptors / Indicators: Sore Pain Intervention(s): Limited activity within patient's tolerance;Premedicated before session     Hand Dominance Right   Extremity/Trunk Assessment Upper Extremity Assessment Upper Extremity Assessment: Generalized weakness   Lower Extremity Assessment Lower Extremity Assessment: Generalized weakness;LLE  deficits/detail LLE Deficits / Details: Pt demonstrates 2/5 L LE strength LLE: Unable to fully assess due to pain       Communication Communication Communication: No difficulties   Cognition Arousal/Alertness: Lethargic Behavior During Therapy: WFL for tasks assessed/performed;Flat affect Overall Cognitive Status: Within Functional Limits for tasks assessed                     General Comments       Exercises   Other Exercises Other Exercises: Pt performed supine ther-ex for L LE including ankle pumps, quad sets, glut sets, (supervision) and hip abd/add with max assist. Further ther-ex limited secondary to pain. All ther-ex performed x 10 reps with cues for correct technique.   Shoulder Instructions      Home Living Family/patient expects to be discharged to:: Skilled nursing facility Living Arrangements: Children Available Help at Discharge: Available 24 hours/day Type of Home: House Home Access: Stairs to enter Entergy CorporationEntrance Stairs-Number of Steps: 3 Entrance Stairs-Rails: Left Home Layout: One level               Home Equipment: Walker - 2 wheels;Walker - 4 wheels;Toilet riser;Grab bars - toilet;Grab bars - tub/shower;Hospital bed          Prior Functioning/Environment Level of Independence: Independent             OT Diagnosis: Generalized weakness;Acute pain   OT Problem List: Decreased strength;Decreased range of motion;Pain   OT Treatment/Interventions: Self-care/ADL training;Therapeutic exercise    OT Goals(Current goals can be found in the care plan section) Acute Rehab OT Goals Patient Stated Goal: to go to rehab OT Goal Formulation: With patient/family Time For Goal Achievement: 02/07/15 Potential to Achieve Goals: Good  OT Frequency: Min 2X/week   Barriers to D/C:    none       Co-evaluation PT/OT/SLP Co-Evaluation/Treatment: Yes Reason for Co-Treatment: Complexity of the patient's impairments (multi-system involvement);For  patient/therapist safety PT goals addressed during session: Proper use of DME;Mobility/safety with mobility;Strengthening/ROM OT goals addressed during session: ADL's and self-care;Proper use of Adaptive equipment and DME      End of Session Equipment Utilized During Treatment: Gait belt;Rolling walker Nurse Communication: Other (comment) (nsg informed pt has emesis and was up in chair with alarm on)  Activity Tolerance: Patient limited by pain;Patient limited by fatigue Patient left: in chair;with call bell/phone within reach;with chair alarm set;with family/visitor present   Time: 1100-1140 OT Time Calculation (min): 40 min Charges:  OT General Charges $OT Visit: 1 Procedure OT Evaluation $Initial OT Evaluation Tier I: 1 Procedure OT Treatments $Self Care/Home Management : 23-37 mins G-Codes:    Colleen Carson 01/24/2015, 11:48 AM    Colleen Carson, OTR/L

## 2015-01-24 NOTE — Evaluation (Signed)
Physical Therapy Evaluation Patient Details Name: Colleen DawleyHilda M Carson MRN: [id number] DOB: 05/11/1925 Today's Date: 01/24/2015   History of Present Illness  Pt is a pleasant 81 year old female who was admitted for L THR.   Clinical Impression  Pt currently performed bed mobility/transfers with + 2 assist and rw. Pt limited secondary to fatigue/pain/mobility. Pt unable to ambulate at this time and requires heavy cues for correct WB status. Pt performed all mobility while on room air, however O2 sats decreased to 87%. RN notified and 2L of O2 reapplied. Pt with current deficits of balance/strength/ROM/ambulation. Pt would benefit from skilled PT services at this time to address above mentioned deficits. Recommend transition to STR for further therapy post DC from hospital.    Follow Up Recommendations SNF    Equipment Recommendations  None recommended by PT    Recommendations for Other Services       Precautions / Restrictions Precautions Precautions: Anterior Hip Precaution Booklet Issued: No Precaution Comments: anterior- educated patient/family Restrictions Weight Bearing Restrictions: Yes LLE Weight Bearing: Weight bearing as tolerated      Mobility  Bed Mobility Overal bed mobility: +2 for physical assistance;Needs Assistance Bed Mobility: Supine to Sit     Supine to sit: +2 for physical assistance;Max assist     General bed mobility comments: +2 assist for scooting towards EOB and supine->sit. Once seated at EOB, pt able to sit with cga. Pt complains of nausea with activity.  Transfers Overall transfer level: Needs assistance Equipment used: Rolling walker (2 wheeled) Transfers: Sit to/from Stand Sit to Stand: +2 physical assistance;Max assist         General transfer comment: Pt unable to fully WB on L LE with knee flexion noted. Heavy cues for WB on rw. Pt fatigues quickly with standing and becomes nauseated. Unable to ambulate at this time requiring increased assist  from therapist. Recliner chair pulled under patient.  Ambulation/Gait             General Gait Details: Unable to ambulate at this time.  Stairs            Wheelchair Mobility    Modified Rankin (Stroke Patients Only)       Balance Overall balance assessment: Needs assistance Sitting-balance support: Bilateral upper extremity supported Sitting balance-Leahy Scale: Fair     Standing balance support: Bilateral upper extremity supported Standing balance-Leahy Scale: Poor                               Pertinent Vitals/Pain Pain Assessment: 0-10 Pain Score: 7  Pain Descriptors / Indicators: Sore Pain Intervention(s): Limited activity within patient's tolerance;Premedicated before session    Home Living Family/patient expects to be discharged to:: Skilled nursing facility Living Arrangements: Children Available Help at Discharge: Available 24 hours/day Type of Home: House Home Access: Stairs to enter Entrance Stairs-Rails: Left Entrance Stairs-Number of Steps: 3 Home Layout: One level Home Equipment: Walker - 2 wheels;Walker - 4 wheels;Toilet riser;Grab bars - toilet;Grab bars - tub/shower;Hospital bed      Prior Function Level of Independence: Independent with assistive device(s)               Hand Dominance        Extremity/Trunk Assessment   Upper Extremity Assessment: Generalized weakness           Lower Extremity Assessment: Generalized weakness;LLE deficits/detail   LLE Deficits / Details: Pt demonstrates 2/5 L LE strength  Communication   Communication: No difficulties  Cognition Arousal/Alertness: Lethargic Behavior During Therapy: WFL for tasks assessed/performed Overall Cognitive Status: Within Functional Limits for tasks assessed                      General Comments      Exercises Other Exercises Other Exercises: Pt performed supine ther-ex for L LE including ankle pumps, quad sets, glut sets,  (supervision) and hip abd/add with max assist. Further ther-ex limited secondary to pain. All ther-ex performed x 10 reps with cues for correct technique.      Assessment/Plan    PT Assessment Patient needs continued PT services  PT Diagnosis Difficulty walking;Generalized weakness;Acute pain   PT Problem List Decreased strength;Decreased range of motion;Decreased mobility;Pain  PT Treatment Interventions DME instruction;Gait training;Therapeutic exercise   PT Goals (Current goals can be found in the Care Plan section) Acute Rehab PT Goals Patient Stated Goal: to go to rehab PT Goal Formulation: With patient/family Time For Goal Achievement: 02/07/15 Potential to Achieve Goals: Good    Frequency BID   Barriers to discharge   unsafe for current mobility status    Co-evaluation PT/OT/SLP Co-Evaluation/Treatment: Yes Reason for Co-Treatment: For patient/therapist safety;Complexity of the patient's impairments (multi-system involvement) PT goals addressed during session: Proper use of DME;Mobility/safety with mobility;Strengthening/ROM OT goals addressed during session: ADL's and self-care;Proper use of Adaptive equipment and DME       End of Session Equipment Utilized During Treatment: Gait belt Activity Tolerance: Patient limited by fatigue;Patient limited by lethargy;Patient limited by pain Patient left: in chair;with chair alarm set;with family/visitor present Nurse Communication: Mobility status         Time: 1000-1030 PT Time Calculation (min) (ACUTE ONLY): 30 min   Charges:   PT Evaluation $Initial PT Evaluation Tier I: 1 Procedure PT Treatments $Therapeutic Exercise: 8-22 mins   PT G Codes:        Avni Traore 01/24/2015, 11:10 AM

## 2015-01-24 NOTE — Progress Notes (Addendum)
  Subjective: 1 Day Post-Op Procedure(s) (LRB): TOTAL HIP ARTHROPLASTY ANTERIOR APPROACH (Left) Patient reports pain as mild.   Patient seen in rounds with Dr. Rosita KeaMenz. Patient is well, but has had some minor complaints of anxiety and nausea/vomiting Plan is to go Rehab after hospital stay.  Prefers [facility] Negative for chest pain and shortness of breath Fever: no Gastrointestinal:Positive for nausea and vomiting, but improving  Objective: Vital signs in last 24 hours: Temp:  [96.6 F (35.9 C)-98.6 F (37 C)] 98 F (36.7 C) (05/04 0401) Pulse Rate:  [54-84] 81 (05/04 0401) Resp:  [8-24] 18 (05/04 0401) BP: (100-160)/(46-79) 130/60 mmHg (05/04 0401) SpO2:  [91 %-100 %] 94 % (05/04 0401) Weight:  [65.772 kg (145 lb)] 65.772 kg (145 lb) (05/03 0618)  Intake/Output from previous day:  Intake/Output Summary (Last 24 hours) at 01/24/15 0611 Last data filed at 01/24/15 0405  Gross per 24 hour  Intake 1678.75 ml  Output   1717 ml  Net -38.25 ml    Intake/Output this shift: Total I/O In: -  Out: 1067 [Urine:1000; Drains:67]  Labs:  Recent Labs  01/24/15 0442  HGB 9.9*    Recent Labs  01/24/15 0442  WBC 9.8  RBC 3.33*  HCT 30.3*  PLT 183   No results for input(s): NA, K, CL, CO2, BUN, CREATININE, GLUCOSE, CALCIUM in the last 72 hours.  Recent Labs  01/23/15 0624 01/24/15 0442  INR 0.92 0.97     EXAM General - Patient is Alert and Oriented Extremity - Neurologically intact Dorsiflexion/Plantar flexion intact Compartment soft Dressing/Incision - clean Motor Function - intact, moving foot and toes well on exam. Able to move slightly without pain  Past Medical History  Diagnosis Date  . Hypertension   . Dysrhythmia     Afib  . Hypercholesteremia   . Lymphedema     lower extremities  . Stroke     noted ischemia on CT scan  . Shingles   . Arthritis     Assessment/Plan: 1 Day Post-Op Procedure(s) (LRB): TOTAL HIP ARTHROPLASTY ANTERIOR APPROACH  (Left) Active Problems:   Primary osteoarthritis of left hip  Estimated body mass index is 26.51 kg/(m^2) as calculated from the following:   Height as of this encounter: 5\' 2"  (1.575 m).   Weight as of this encounter: 65.772 kg (145 lb). Up with therapy Discharge to SNF on Friday/Saturday  DVT Prophylaxis - Aspirin, Coumadin, Foot Pumps and TED hose Weight-Bearing as tolerated to Left leg  Dedra Skeensodd Mundy, PA-C Orthopaedic Surgery 01/24/2015, 6:11 AM   Hgb 9.9, post op blood loss anemia Will start Fe supplement, recheck in am

## 2015-01-24 NOTE — Progress Notes (Signed)
Patient a&o, VSS. Sleepy at times. C/o nausea, medication administered with some relief. Family at bedside. Dressing dry and intact. TEDS and foot pumps in place. Pain controlled. Patient reports less nausea. Bilateral lower extremities swollen with 3+ edema, TEDS on. No complaints at this time. Progressing towards goals. Continue to monitor.

## 2015-01-24 NOTE — Care Management Note (Signed)
Case Management Note  Patient Details  Name: Colleen DawleyHilda M Ivens MRN: [id number] Date of Birth: August 29, 1925  Subjective/Objective:                  Patient awake and alert but nods off to sleep during conversation. Information provided by daughter and son-in-law. Patient [facility] her [facility] live with this daughter and son-in-law. Patient and family have previously arranged [facility] pending Physical Therapy evaluation. She has a front-wheeled walker available at home and a rollator. They would like to use [facility] if/when patient returns home. PT/INRs would be needed at discharge due to Coumadin use. [facility] [phone].   Action/Plan: RNCM to continue to follow along with CSW for discharge planning. CSW updated on patient information.   Expected Discharge Date:  01/26/15               Expected Discharge Plan:  Skilled Nursing Facility  In-House Referral:  Clinical Social Work  Discharge planning Services  CM Consult  Post Acute Care Choice:  Home Health Choice offered to:  Adult Children  DME Arranged:  N/A DME Agency:     HH Arranged:  NA HH Agency:  [facility]  Status of Service:  In process, will continue to follow  Medicare Important Message Given:  Yes Date Medicare IM Given:  01/24/15 Medicare IM give by:  Collie SiadAngela Kinzley Savell Date Additional Medicare IM Given:    Additional Medicare Important Message give by:     If discussed at Long Length of Stay Meetings, dates discussed:    Additional Comments:  [name], RN 01/24/2015, 9:47 AM

## 2015-01-24 NOTE — Hospital Discharge Follow-Up (Signed)
Spoke with Tasia Catchingsraig. He stated the patient was currently in the hospital and the issue was pain control last night. Education provided to make constant communication with the medical provider regarding the pain and if it is resolved. Tasia CatchingsCraig verbalized understanding of the education.

## 2015-01-24 NOTE — Clinical Social Work Note (Signed)
Clinical Social Work Assessment  Patient Details  Name: Colleen Carson MRN: [id number] Date of Birth: 08-25-1925  Date of referral:  01/24/15               Reason for consult:  Facility Placement                Permission sought to share information with:  Facility Sport and exercise psychologist, Family Supports Permission granted to share information::  Yes, Verbal Permission Granted  Name::      [place]::   [place]   Relationship::   Daughter Helayne [place] Information:   Almyra Free Daughter phone [phone]  Housing/Transportation Living arrangements for the past 2 months:  [place] of Information:  Patient, Adult Children Patient Interpreter Needed:  None Criminal Activity/Legal Involvement Pertinent to Current Situation/Hospitalization:  No - Comment as needed Significant Relationships:  Adult Children, Other Family Members Lives with:  Adult Children Do you feel safe going back to the place where you live?  Yes Need for family participation in patient care:  Yes (Comment)Patient lives with her daughter and son in [place]. They provide care for her in the home.   Care giving concerns: Patient lives with her daughter and son in law, who are her caregivers.    Social Worker assessment / plan: Holiday representative (CSW) met with patient to address SNF consult. Patient was sitting in the chair and had just worked with PT. Patient appeared tired and lethargic. Patient is familiar to CSW from [facility]. Patient reported that she lives with her daughter and son in law in [place]. Patient met with [facility] admissions coordinator atr [facility] prior to surgery. CSW contacted patient's daughter Almyra Free. Per Almyra Free she left the hospital at 3 am this morning and went home to rest. Daughter reported she and her husband are patient's primary caregivers. Daughter is agreeable to SNF search and prefers [facility].   FL2 complete and faxed out. Per  Seth Bake admissions coordinator at [facility] patient has been accepted and has a private room at no extra cost on Friday. CSW made daughter and patient aware of above. Plan is for patient to D/C to [facility] on Friday. CSW will continue to follow and assist as needed.    Employment status:  Retired Forensic scientist:  Medicare PT Recommendations:  [place] / Referral to community resources:  [place]  Patient/Family's Response to care: Patient and family are agreeable to [facility] at D/C.   Patient/Family's Understanding of and Emotional Response to Diagnosis, Current Treatment, and Prognosis:  This surgery was planned. Patient and daughter Almyra Free came to [facility] and toured [facility]. Patient and daughter thanked CSW for visit and assisting with placement. Patient moved here from [place] to live with her daughter and son in [place].   Emotional Assessment Appearance:  Appears stated age Attitude/Demeanor/Rapport:  Lethargic Affect (typically observed):  Appropriate, Quiet Orientation:  Oriented to Self, Oriented to Place, Oriented to  Time, Oriented to Situation Alcohol / Substance use:  Never Used Psych involvement (Current and /or in the community):  No (Comment)  Discharge Needs  Concerns to be addressed:  Other (Comment Required (Patient is going to a [place] ) Readmission within the last 30 days:  No Current discharge risk:  None Barriers to Discharge:  No Barriers Identified   Loralyn Freshwater, LCSW 01/24/2015, 11:23 AM

## 2015-01-25 ENCOUNTER — Encounter: Payer: Self-pay | Admitting: Orthopedic Surgery

## 2015-01-25 DIAGNOSIS — Z96649 Presence of unspecified artificial hip joint: Secondary | ICD-10-CM

## 2015-01-25 HISTORY — DX: Presence of unspecified artificial hip joint: Z96.649

## 2015-01-25 LAB — PROTIME-INR
INR: 1.09
Prothrombin Time: 14.3 seconds (ref 11.4–15.0)

## 2015-01-25 LAB — SURGICAL PATHOLOGY

## 2015-01-25 LAB — HEMOGLOBIN: HEMOGLOBIN: 8.9 g/dL — AB (ref 12.0–16.0)

## 2015-01-25 MED ORDER — CALCIUM CARBONATE ANTACID 500 MG PO CHEW: 1.0000 | CHEWABLE_TABLET | Freq: Two times a day (BID) | ORAL | Status: DC | PRN

## 2015-01-25 MED ORDER — ONDANSETRON 8 MG PO TBDP: 8.0000 mg | ORAL_TABLET | Freq: Three times a day (TID) | ORAL | Status: DC | PRN

## 2015-01-25 MED ORDER — HYDROCODONE-ACETAMINOPHEN 5-325 MG PO TABS: 1.0000 | ORAL_TABLET | Freq: Four times a day (QID) | ORAL | Status: DC | PRN

## 2015-01-25 MED ORDER — FE FUMARATE-B12-VIT C-FA-IFC PO CAPS: 1.0000 | ORAL_CAPSULE | Freq: Two times a day (BID) | ORAL | Status: DC

## 2015-01-25 MED ORDER — ASPIRIN 81 MG PO TBEC: 81.0000 mg | DELAYED_RELEASE_TABLET | Freq: Every day | ORAL | Status: DC

## 2015-01-25 MED ORDER — BISACODYL 10 MG RE SUPP: 10.0000 mg | Freq: Every day | RECTAL | Status: DC | PRN

## 2015-01-25 MED ORDER — TRIAMCINOLONE ACETONIDE 0.5 % EX CREA: 1.0000 "application " | TOPICAL_CREAM | Freq: Two times a day (BID) | CUTANEOUS | Status: DC

## 2015-01-25 MED ORDER — SENNOSIDES-DOCUSATE SODIUM 8.6-50 MG PO TABS: 1.0000 | ORAL_TABLET | Freq: Every evening | ORAL | Status: DC | PRN

## 2015-01-25 NOTE — Progress Notes (Signed)
   Subjective: 2 Days Post-Op Procedure(s) (LRB): TOTAL HIP ARTHROPLASTY ANTERIOR APPROACH (Left) Patient reports pain as 2 on 0-10 scale.   Patient is well, and has had no acute complaints or problems We will continue with physical therapy today Plan is to go Rehab after hospital stay.  Objective: Vital signs in last 24 hours: Temp:  [97.8 F (36.6 C)-99.5 F (37.5 C)] 99.5 F (37.5 C) (05/05 0413) Pulse Rate:  [65-76] 69 (05/05 0413) Resp:  [18] 18 (05/05 0413) BP: (107-132)/(42-55) 107/42 mmHg (05/05 0413) SpO2:  [96 %-98 %] 97 % (05/05 0413)  Intake/Output from previous day: 05/04 0701 - 05/05 0700 In: 1402.5 [P.O.:540; I.V.:862.5] Out: 650 [Urine:650] Intake/Output this shift: Total I/O In: -  Out: 175 [Urine:175]   Recent Labs  01/24/15 0442 01/25/15 0507  HGB 9.9* 8.9*    Recent Labs  01/24/15 0442  WBC 9.8  RBC 3.33*  HCT 30.3*  PLT 183    Recent Labs  01/24/15 0442  NA 140  K 3.9  CL 103  CO2 30  BUN 16  CREATININE 0.84  GLUCOSE 153*  CALCIUM 8.5*    Recent Labs  01/24/15 0442 01/25/15 0507  INR 0.97 1.09    EXAM General - Patient is Alert and Oriented Extremity - Neurologically intact Neurovascular intact Sensation intact distally Intact pulses distally Dorsiflexion/Plantar flexion intact Dressing - scant drainage, changed today Motor Function - intact, moving foot and toes well on exam.   Past Medical History  Diagnosis Date  . Hypertension   . Dysrhythmia     Afib  . Hypercholesteremia   . Lymphedema     lower extremities  . Stroke     noted ischemia on CT scan  . Shingles   . Arthritis     Assessment/Plan: 2 Days Post-Op Procedure(s) (LRB): TOTAL HIP ARTHROPLASTY ANTERIOR APPROACH (Left) Active Problems:   Primary osteoarthritis of left hip  Estimated body mass index is 26.51 kg/(m^2) as calculated from the following:   Height as of this encounter: 5\' 2"  (1.575 m).   Weight as of this encounter: 65.772 kg  (145 lb). Advance diet Up with therapy D/C IV fluids Plan for discharge tomorrow Discharge to SNF  Continue to follow INR  DVT Prophylaxis - Lovenox, Foot Pumps and TED hose Weight-Bearing as tolerated to left leg D/C O2 and Pulse OX and try on Room Air  T. [place] Neighborhris Ignacia Gentzler, PA-C [facility] 01/25/2015, 8:18 AM

## 2015-01-25 NOTE — Progress Notes (Signed)
Per PA patient will be ready for D/C tomorrow 01/26/15. [facility]. Clinical Social Worker (CSW) will continue to follow and assist as needed.   Jetta LoutBailey Morgan, LCSWA [phone]

## 2015-01-25 NOTE — Progress Notes (Signed)
Physical Therapy Treatment Patient Details Name: Colleen Carson MRN: [id number] DOB: 1925-06-07 Today's Date: 01/25/2015    History of Present Illness Pt is a pleasant 81 year old female who was admitted for L THR.     PT Comments    Pt with improved distance and quality of ambulation this p.m. Requires only 1 person assist now. Re educated and encouraged continued exercises.   Follow Up Recommendations  SNF     Equipment Recommendations       Recommendations for Other Services       Precautions / Restrictions Precautions Precautions: Anterior Hip Restrictions Weight Bearing Restrictions: Yes LLE Weight Bearing: Weight bearing as tolerated    Mobility  Bed Mobility Overal bed mobility: Needs Assistance Bed Mobility: Sit to Supine     Supine to sit: Mod assist;+2 for physical assistance Sit to supine: Mod assist   General bed mobility comments: Improved ability to scoot edge of bed  Transfers Overall transfer level: Needs assistance Equipment used: Rolling walker (2 wheeled) Transfers: Sit to/from Stand Sit to Stand: Min assist         General transfer comment: Requires cueing for proper hand placement.  Ambulation/Gait Ambulation/Gait assistance: Min assist Ambulation Distance (Feet): 20 Feet Assistive device: Rolling walker (2 wheeled) Gait Pattern/deviations: Step-to pattern;Decreased step length - right;Decreased stride length;Decreased dorsiflexion - right;Decreased dorsiflexion - left   Gait velocity interpretation: Below normal speed for age/gender General Gait Details:  (Cues for sequence, rw distance; tactile for LLE advance 25%)   Stairs            Wheelchair Mobility    Modified Rankin (Stroke Patients Only)       Balance                                    Cognition Arousal/Alertness: Awake/alert Behavior During Therapy: WFL for tasks assessed/performed Overall Cognitive Status: Within Functional Limits for tasks  assessed       Memory: Decreased short-term memory              Exercises Total Joint Exercises Ankle Circles/Pumps: AROM;Both;20 reps;Supine Quad Sets: Strengthening;Both;20 reps;Supine Gluteal Sets: Strengthening;20 reps;Supine;Both Towel Squeeze: Strengthening;Both;20 reps;Supine Short Arc Quad: AROM;Both;20 reps;Supine Heel Slides: AAROM;Both;20 reps;Supine Hip ABduction/ADduction: AAROM;Both;20 reps;Supine    General Comments        Pertinent Vitals/Pain Pain Assessment: 0-10 Pain Score: 3  Pain Location: LLE Pain Intervention(s): Premedicated before session    Home Living                      Prior Function            PT Goals (current goals can now be found in the care plan section) Progress towards PT goals: Progressing toward goals    Frequency  BID    PT Plan Current plan remains appropriate    Co-evaluation             End of Session Equipment Utilized During Treatment: Gait belt;Oxygen Activity Tolerance: Patient limited by pain Patient left: in bed;with call bell/phone within reach;with bed alarm set     Time: 1311-1340 PT Time Calculation (min) (ACUTE ONLY): 29 min  Charges:  $Gait Training: 8-22 mins $Therapeutic Exercise: 8-22 mins $Therapeutic Activity: 8-22 mins                    G Codes:  Elsie StainHeidi Elizabeth Bishop 01/25/2015, 1:59 PM

## 2015-01-25 NOTE — Progress Notes (Signed)
Physical Therapy Treatment Patient Details Name: Colleen DawleyHilda M Carson MRN: [id number] DOB: 04-27-1925 Today's Date: 01/25/2015    History of Present Illness Pt is a pleasant 81 year old female who was admitted for L THR.     PT Comments    Pt with improved functional mobility today requiring decreased level of assistance; however continue to recommend 2 assist for safety. Daughter/patient educated on supine/long sit exercises to be performed multiple times throughout the day. Pt/daughter educated on importance of increased time in sit position today, benefits of movement and incentive spirometer. Plan to see pt this p.m.  Follow Up Recommendations  SNF     Equipment Recommendations       Recommendations for Other Services       Precautions / Restrictions Precautions Precautions: Anterior Hip Restrictions Weight Bearing Restrictions: Yes LLE Weight Bearing: Weight bearing as tolerated    Mobility  Bed Mobility Overal bed mobility: +2 for physical assistance Bed Mobility: Sit to Supine     Supine to sit: Mod assist;+2 for physical assistance     General bed mobility comments: Improved ability to scoot edge of bed  Transfers Overall transfer level: Needs assistance Equipment used: Rolling walker (2 wheeled) Transfers: Sit to/from Stand (x2) Sit to Stand: Min assist;+2 physical assistance         General transfer comment: Requires cueing for proper hand placement.  Ambulation/Gait Ambulation/Gait assistance: Min assist;+2 physical assistance Ambulation Distance (Feet): 6 Feet Assistive device: Rolling walker (2 wheeled) Gait Pattern/deviations: Step-to pattern;Decreased step length - right;Decreased step length - left;Decreased dorsiflexion - right;Decreased dorsiflexion - left;Decreased weight shift to left   Gait velocity interpretation: Below normal speed for age/gender General Gait Details: 8 steps bed to chair; cueing for sequence and direction   Stairs             Wheelchair Mobility    Modified Rankin (Stroke Patients Only)       Balance                                    Cognition Arousal/Alertness: Awake/alert Behavior During Therapy: WFL for tasks assessed/performed Overall Cognitive Status: Within Functional Limits for tasks assessed       Memory: Decreased short-term memory              Exercises Total Joint Exercises Ankle Circles/Pumps: AROM;Both;20 reps;Supine Quad Sets: Strengthening;Both;20 reps;Supine Gluteal Sets: Strengthening;20 reps;Supine;Both Towel Squeeze: Strengthening;Both;20 reps;Supine Short Arc Quad: AROM;Both;20 reps;Supine Heel Slides: AAROM;Both;20 reps;Supine (more assist on L; limited range L) Hip ABduction/ADduction: AAROM;Both;20 reps;Supine (More assist needed on L)    General Comments        Pertinent Vitals/Pain Pain Assessment: 0-10 Pain Score: 6  Pain Location: LLE Pain Intervention(s): RN gave pain meds during session    Home Living                      Prior Function            PT Goals (current goals can now be found in the care plan section) Progress towards PT goals: Progressing toward goals    Frequency  BID    PT Plan Current plan remains appropriate    Co-evaluation             End of Session Equipment Utilized During Treatment: Oxygen Activity Tolerance: Patient limited by pain (Improved functional mobility; less assist) Patient left: in chair;with  chair alarm set;with family/visitor present     Time: 0931-1009 PT Time Calculation (min) (ACUTE ONLY): 38 min  Charges:  $Gait Training: 8-22 mins $Therapeutic Exercise: 8-22 mins $Therapeutic Activity: 8-22 mins                    G Codes:      Colleen MissHeidi Elizabeth Carson 01/25/2015, 10:31 AM

## 2015-01-25 NOTE — Discharge Summary (Addendum)
Physician Discharge Summary  Patient ID: Colleen Carson MRN: [id number] DOB/AGE: 02-18-1925 81 y.o.  Admit date: 01/23/2015 Discharge date: 01/26/15 Admission Diagnoses:  S/P total hip arthroplasty Left  Discharge Diagnoses:  Principal Problem:   S/P total hip arthroplasty Active Problems:   Primary osteoarthritis of left hip   Past Medical History  Diagnosis Date  . Hypertension   . Dysrhythmia     Afib  . Hypercholesteremia   . Lymphedema     lower extremities  . Stroke     noted ischemia on CT scan  . Shingles   . Arthritis   . S/P total hip arthroplasty 01/25/2015    Surgeries: Procedure(s): TOTAL HIP ARTHROPLASTY ANTERIOR APPROACH on 01/23/2015     Consultants (if any):    Discharged Condition: Improved  PRE-OPERATIVE DIAGNOSIS: OSTEOARTHRITIS LEFT HIP  POST-OPERATIVE DIAGNOSIS: OSTEOARTHRITIS LEFT HIP  PROCEDURE: Procedure(s): TOTAL HIP ARTHROPLASTY ANTERIOR APPROACH (Left)  SURGEON: Surgeon(s) and Role:  * Kennedy Bucker, MD - Primary  PHYSICIAN ASSISTANT:   ASSISTANTS: none   ANESTHESIA: spinal  EBL: Total I/O In: 1100 [I.V.:1100] Out: 200 [Urine:100; Blood:100]  BLOOD ADMINISTERED:none  DRAINS: Hemovac  LOCAL MEDICATIONS USED: MARCAINE   SPECIMEN: Source of Specimen: Femoral head  DISPOSITION OF SPECIMEN: PATHOLOGY   iMplants: Medacta Amis 3 stem with an M 28 mm head, liner and Mpact 52 mm DM cup      Hospital Course: [name] is an 81 y.o. female who was admitted 01/23/2015 with a diagnosis of S/P total hip arthroplasty and went to the operating room on 01/23/2015 and underwent the above named procedures.  Patient was brought to the ward for the PACU in stable condition. Postop day 1 patient had slow progress in physical therapy. Labs and vital signs are stable. She did have acute postoperative blood loss anemia and was treated with iron sulfate. Hemoglobin was stable. Vital signs are stable. On postoperative day 2 and 3  patient made progress with physical therapy. By postoperative day 3, patient was stable and ready for discharge to rehabilitation facility for continuation of physical therapy and occupational therapy.  She was given perioperative antibiotics:  Anti-infectives    Start     Dose/Rate Route Frequency Ordered Stop   01/23/15 1400  ceFAZolin (ANCEF) IVPB 2 g/50 mL premix     2 g 100 mL/hr over 30 Minutes Intravenous 3 times per day 01/23/15 0947 01/24/15 0618   01/23/15 0635  ceFAZolin (ANCEF) 2-3 GM-% IVPB SOLR    Comments:  Buck Mam: cabinet override      01/23/15 0635 01/23/15 1844   01/23/15 0622  ceFAZolin (ANCEF) IVPB 2 g/50 mL premix     2 g 100 mL/hr over 30 Minutes Intravenous On call to O.R. 01/23/15 1191 01/23/15 4782    .  She was given sequential compression devices, early ambulation, and Coumadin for DVT prophylaxis.  She benefited maximally from the hospital stay and there were no complications.    Recent vital signs:  Filed Vitals:   01/25/15 0827  BP: 131/40  Pulse: 79  Temp: 99.6 F (37.6 C)  Resp: 18    Recent laboratory studies:  Lab Results  Component Value Date   HGB 8.9* 01/25/2015   HGB 9.9* 01/24/2015   HGB 12.8 01/10/2015   Lab Results  Component Value Date   WBC 9.8 01/24/2015   PLT 183 01/24/2015   Lab Results  Component Value Date   INR 1.09 01/25/2015   Lab Results  Component  Value Date   NA 140 01/24/2015   K 3.9 01/24/2015   CL 103 01/24/2015   CO2 30 01/24/2015   BUN 16 01/24/2015   CREATININE 0.84 01/24/2015   GLUCOSE 153* 01/24/2015    Discharge Medications:     Medication List    TAKE these medications        amLODipine 5 MG tablet  Commonly known as:  NORVASC  Take 5 mg by mouth daily.     aspirin 81 MG tablet  Take 81 mg by mouth daily.     aspirin 81 MG EC tablet  Take 1 tablet (81 mg total) by mouth daily.     bisacodyl 10 MG suppository  Commonly known as:  DULCOLAX  Place 1 suppository (10 mg  total) rectally daily as needed for moderate constipation.     CENTRUM SILVER PO  Take 1 tablet by mouth daily.     ferrous fumarate-b12-vitamic C-folic acid capsule  Commonly known as:  TRINSICON / FOLTRIN  Take 1 capsule by mouth 2 (two) times daily after a meal.     fluticasone 50 MCG/ACT nasal spray  Commonly known as:  FLONASE  Place 2 sprays into both nostrils daily as needed for rhinitis.     HYDROcodone-acetaminophen 5-325 MG per tablet  Commonly known as:  NORCO/VICODIN  Take 1-2 tablets by mouth every 6 (six) hours as needed for moderate pain.     losartan 50 MG tablet  Commonly known as:  COZAAR  Take 50 mg by mouth daily.     ondansetron 8 MG disintegrating tablet  Commonly known as:  ZOFRAN-ODT  Take 1 tablet (8 mg total) by mouth every 8 (eight) hours as needed for nausea or vomiting (Try 4 mg initially, and increase to 8 mg if no improvement).     senna-docusate 8.6-50 MG per tablet  Commonly known as:  Senokot-S  Take 1 tablet by mouth at bedtime as needed for mild constipation.     simvastatin 20 MG tablet  Commonly known as:  ZOCOR  Take 20 mg by mouth at bedtime.     triamcinolone cream 0.5 %  Commonly known as:  KENALOG  Apply 1 application topically 2 (two) times daily.     warfarin 2 MG tablet  Commonly known as:  COUMADIN  Take 3 mg by mouth daily.        Diagnostic Studies: Dg C-arm 61-120 Min  01/23/2015   CLINICAL DATA:  Left hip replacement  EXAM: OPERATIVE LEFT HIP WITH PELVIS; DG C-ARM 61-120 MIN  COMPARISON:  None.  FLUOROSCOPY TIME:  Radiation Exposure Index (as provided by the fluoroscopic device): Not available  If the device does not provide the exposure index:  Fluoroscopy Time:  14 seconds  Number of Acquired Images:  1  FINDINGS: A left hip prosthesis is noted in adequate position.  IMPRESSION: Status post left hip prosthesis.   Electronically Signed   By: Alcide CleverMark  Lukens M.D.   On: 01/23/2015 09:31   Dg Hip Operative Unilat With Pelvis  Left  01/23/2015   CLINICAL DATA:  Left hip replacement  EXAM: OPERATIVE LEFT HIP WITH PELVIS; DG C-ARM 61-120 MIN  COMPARISON:  None.  FLUOROSCOPY TIME:  Radiation Exposure Index (as provided by the fluoroscopic device): Not available  If the device does not provide the exposure index:  Fluoroscopy Time:  14 seconds  Number of Acquired Images:  1  FINDINGS: A left hip prosthesis is noted in adequate position.  IMPRESSION: Status  post left hip prosthesis.   Electronically Signed   By: Alcide CleverMark  Lukens M.D.   On: 01/23/2015 09:31   Dg Hip Unilat With Pelvis 2-3 Views Left  01/23/2015   CLINICAL DATA:  Hardware placement.  EXAM: LEFT HIP (WITH PELVIS) 2-3 VIEWS  COMPARISON:  None.  FINDINGS: LEFT total hip arthroplasty satisfactory position alignment. No adverse features. Surgical drain good position.  IMPRESSION: Satisfactory appearance LEFT THR.   Electronically Signed   By: Davonna BellingJohn  Curnes M.D.   On: 01/23/2015 10:55    Disposition: Final discharge disposition not confirmed      Discharge Instructions    Change dressing (specify)    Complete by:  As directed   As needed     Diet - low sodium heart healthy    Complete by:  As directed      Discharge instructions    Complete by:  As directed   Please check INR daily     Increase activity slowly    Complete by:  As directed      Leave dressing on - Keep it clean, dry, and intact until clinic visit    Complete by:  As directed            Follow-up Information    Follow up with Angelik Walls, MD. Call in 2 weeks.   Specialty:  Orthopedic Surgery   Why:  For staple removal and skin check   Contact information:   [address] [facility] [place] [address] [phone]        Signed: Patience MuscaGAINES, THOMAS CHRISTOPHER 01/25/2015, 8:40 AM

## 2015-01-25 NOTE — Plan of Care (Signed)
Problem: Phase I Progression Outcomes Goal: Initial discharge plan identified Outcome: Progressing Patient to discharge to snf for rehab  Problem: Phase II Progression Outcomes Goal: Ambulates Outcome: Not Progressing Patient a 2 person assist to ambulate Goal: Discharge plan established Outcome: Completed/Met Date Met:  01/25/15 Patient has a bed at [facility]

## 2015-01-25 NOTE — Progress Notes (Signed)
Patient medicated for pain x1 with relief. Patient with some confusion after waking up during the night but was able to be re-oriented. Fluids infusing without difficulty. Voiding on the bedpan. Free from falls and injury this shift.

## 2015-01-26 LAB — PROTIME-INR
INR: 0.99
Prothrombin Time: 13.3 s (ref 11.4–15.0)

## 2015-01-26 NOTE — Progress Notes (Signed)
Pt transported to [facility] via [facility] daughter in attendance . Pt sent with honeycomb dressing per nurse request , report called to cheryl , skin intact , incision anterior left hip intact on transfer

## 2015-01-26 NOTE — Progress Notes (Signed)
   Subjective: 3 Days Post-Op Procedure(s) (LRB): TOTAL HIP ARTHROPLASTY ANTERIOR APPROACH (Left) Patient reports pain as mild.   Patient is well, and has had no acute complaints or problems We will continue with physical therapy today  Plan is to go to rehab today, [facility]  Objective: Vital signs in last 24 hours: Temp:  [97.9 F (36.6 C)-99.6 F (37.6 C)] 97.9 F (36.6 C) (05/06 0425) Pulse Rate:  [74-82] 74 (05/06 0425) Resp:  [18] 18 (05/06 0425) BP: (129-147)/(40-63) 129/44 mmHg (05/06 0425) SpO2:  [90 %-95 %] 90 % (05/06 0425)  Intake/Output from previous day: 05/05 0701 - 05/06 0700 In: 720 [P.O.:720] Out: 977 [Urine:975; Stool:2] Intake/Output this shift:     Recent Labs  01/24/15 0442 01/25/15 0507  HGB 9.9* 8.9*    Recent Labs  01/24/15 0442  WBC 9.8  RBC 3.33*  HCT 30.3*  PLT 183    Recent Labs  01/24/15 0442  NA 140  K 3.9  CL 103  CO2 30  BUN 16  CREATININE 0.84  GLUCOSE 153*  CALCIUM 8.5*    Recent Labs  01/25/15 0507 01/26/15 0548  INR 1.09 0.99    EXAM General - Patient is Alert and Oriented Extremity - Neurologically intact Neurovascular intact Sensation intact distally Intact pulses distally Dorsiflexion/Plantar flexion intact No cellulitis present Dressing - dressing C/D/I Motor Function - intact, moving foot and toes well on exam.   Past Medical History  Diagnosis Date  . Hypertension   . Dysrhythmia     Afib  . Hypercholesteremia   . Lymphedema     lower extremities  . Stroke     noted ischemia on CT scan  . Shingles   . Arthritis   . S/P total hip arthroplasty 01/25/2015    Assessment/Plan:   3 Days Post-Op Procedure(s) (LRB): TOTAL HIP ARTHROPLASTY ANTERIOR APPROACH (Left) Principal Problem:   S/P total hip arthroplasty Active Problems:   Primary osteoarthritis of left hip  Estimated body mass index is 26.51 kg/(m^2) as calculated from the following:   Height as of this encounter: 5\' 2"  (1.575  m).   Weight as of this encounter: 145 lb (65.772 kg). Discharge to SNF  DVT Prophylaxis - Coumadin Weight-Bearing as tolerated to left leg D/C O2 and Pulse OX and try on Room Air Follow up with [facility] in 2 weeks  T. Cranston Neighborhris Gaines, PA-C [facility] 01/26/2015, 7:56 AM

## 2015-01-26 NOTE — Progress Notes (Signed)
Patient is medically stable for D/C to [facility] today. Per Tree surgeonCrystal social worker at [facility] patient is going to private room 317. RN will call report at [phone] and arrange EMS for transport. Clinical Child psychotherapistocial Worker (CSW) prepared D/C packet and sent D/C Summary and Ortho D/C Instructions to [facility] yesterday. CSW sent follow up appointments to [facility] via packet and carefinder. Patient is aware of above. CSW contacted patient's daughter [name] and made her aware of above. Please reconsult if future social work needs arise. CSW signing off.   Jetta LoutBailey Morgan, LCSWA [phone]

## 2015-01-26 NOTE — Progress Notes (Signed)
Pt remaining alert and oriented. Able to move bowels this shift. Pain controled with oral pain medication. Up to bedside commode with assistance. Dressing dry and intact. neuro checks wdl.

## 2015-01-26 NOTE — Progress Notes (Signed)
Occupational Therapy Treatment Patient Details Name: Colleen DawleyHilda M Iman MRN: [id number] DOB: 1925/09/17 Today's Date: 01/26/2015    History of present illness Pt is a pleasant 81 year old female who was admitted for L THR.    OT comments  Pt seen for ADL training using reacher and sock aid with daughter in law in room after practicing and answered questions she had about how much therapy pt would be receiving at [facility].  Pt is motivated to regain independence in ADLs and return to PLOF.  She is supposed to DC to [facility].  Reviewed rec assistive devices and equipment at home with pt and daughter in law.   Follow Up Recommendations       Equipment Recommendations  Other (comment) (reacher and sock aid)    Recommendations for Other Services      Precautions / Restrictions Precautions Precautions: Anterior Hip Restrictions Weight Bearing Restrictions: Yes LLE Weight Bearing: Weight bearing as tolerated       Mobility Bed Mobility Overal bed mobility: Needs Assistance Bed Mobility: Supine to Sit     Supine to sit: Mod assist     General bed mobility comments: assist for LE management and truncal elevation  Transfers Overall transfer level: Needs assistance Equipment used: Rolling walker (2 wheeled) Transfers: Sit to/from Stand Sit to Stand: Mod assist         General transfer comment: cuing for hand placement; very slow, effortful movement transition.  Maintains L LE anterior to BOS with all movement transitions    Balance                                   ADL Overall ADL's : Needs assistance/impaired Eating/Feeding: Independent                   Lower Body Dressing: Moderate assistance;Cueing for safety;With adaptive equipment Lower Body Dressing Details (indicate cue type and reason): pt seated in chair and needed moderate assist and cues to use adaptive equipment/reacher and sock aid with limitations due mainly to pain               General ADL Comments: pt making good progress with ADLs using asisitive devices and no longer with nausea or vomiiting      Vision                     Perception     Praxis      Cognition   Behavior During Therapy: [facility] for tasks assessed/performed Overall Cognitive Status: Within Functional Limits for tasks assessed       Memory: Decreased short-term memory               Extremity/Trunk Assessment               Exercises Other Exercises Other Exercises: Toilet transfer, SPT with RW, mod assist for sit/stand, SPT and standing balance.   Other Exercises: Seated LE therex, 1x10-15, act assist ROM: ankle pumps, LAQs, marching, iso hip adduct, hip abduct.  Moderate difficulty with isolated activation of L LE musculature; limited by pain.   Shoulder Instructions       General Comments      Pertinent Vitals/ Pain       Pain Assessment: 0-10 Pain Score: 4  Pain Location: L LE Pain Descriptors / Indicators: Aching;Constant Pain Intervention(s): Limited activity within patient's tolerance;Premedicated before session;Monitored during session  Home Living                                          Prior Functioning/Environment              Frequency       Progress Toward Goals  OT Goals(current goals can now be found in the care plan section)  Progress towards OT goals: Progressing toward goals  Acute Rehab OT Goals Patient Stated Goal: to go to [facility] OT Goal Formulation: With family Time For Goal Achievement: 02/08/15 Potential to Achieve Goals: Good  Plan Discharge plan remains appropriate    Co-evaluation                 End of Session     Activity Tolerance Patient limited by pain   Patient Left in chair;with call bell/phone within reach;with chair alarm set;with family/visitor present   Nurse Communication          Time: [id number] OT Time Calculation (min): 22 min  Charges:  OT General Charges $OT Visit: 1 Procedure OT Treatments $Self Care/Home Management : 8-22 mins  Lynley Killilea 01/26/2015, 12:03 PM    Susanne BordersSusan Fleda Pagel, OTR/L

## 2015-01-26 NOTE — Clinical Social Work Placement (Signed)
   CLINICAL SOCIAL WORK PLACEMENT  NOTE  Date:  01/26/2015  Patient Details  Name: Colleen Carson MRN: [id number] Date of Birth: Oct 29, 1924  Clinical Social Work is seeking post-discharge placement for this patient at the Skilled  Nursing Facility level of care (*CSW will initial, date and re-position this form in  chart as items are completed):  Yes   Patient/family provided with [facility] Clinical Social Work Department's list of facilities offering this level of care within the geographic area requested by the patient (or if unable, by the patient's family).  Yes   Patient/family informed of their freedom to choose among providers that offer the needed level of care, that participate in Medicare, Medicaid or managed care program needed by the patient, have an available bed and are willing to accept the patient.  Yes   Patient/family informed of [facility]'s ownership interest in [facility] and [facility], as well as of the fact that they are under no obligation to receive care at these facilities.  PASRR submitted to EDS on 01/24/15     PASRR number received on 01/24/15     Existing PASRR number confirmed on       FL2 transmitted to all facilities in geographic area requested by pt/family on 01/24/15     FL2 transmitted to all facilities within larger geographic area on       Patient informed that his/her managed care company has contracts with or will negotiate with certain facilities, including the following:            Patient/family informed of bed offers received.  Patient chooses bed at   [facility]  Physician recommends and patient chooses bed at      Patient to be [facility] to   [facility] on   01/26/15.   Patient [facility] be [facility] facility [facility]   [facility]    Patient family notified on   01/26/15 of transfer.  Name of family member notified:    Patient's daughter [name] was contacted via telephone [phone]    PHYSICIAN Please sign FL2      Additional Comment:    _______________________________________________ Haig ProphetMorgan, Haadi Santellan G, LCSW 01/26/2015, 10:07 AM

## 2015-01-26 NOTE — Progress Notes (Signed)
Physical Therapy Treatment Patient Details Name: Colleen Carson MRN: [id number] DOB: 08-07-1925 Today's Date: 01/26/2015    History of Present Illness Pt is a pleasant 81 year old female who was admitted for L THR.     PT Comments    Patient on room air upon arrival to session with sats in low to mid-80s; requiring re-application of 2L for recovery to >92%.  RN informed/aware.  SaO2 93%, HR 97 end of session on 2L supplemental O2. Patient very slow and guarded with all functional activities; rates pain in L LE at 4/10 at rest, 10/10 with activity (meds received during session).  Unable to tolerate gait distance noted yesterday, tolerating only 3-4' with RW, mod assist +1 (chair follow for safety) this date.  Requires manual assist from therapist for L LE advancement due to pain.  On second attempt, patient spontaneously sitting; requiring mod assist from therapist to control and safely lower to chair.  Follow Up Recommendations  SNF     Equipment Recommendations  None recommended by PT    Recommendations for Other Services       Precautions / Restrictions Precautions Precautions: Anterior Hip Restrictions Weight Bearing Restrictions: Yes LLE Weight Bearing: Weight bearing as tolerated    Mobility  Bed Mobility Overal bed mobility: Needs Assistance Bed Mobility: Supine to Sit     Supine to sit: Mod assist     General bed mobility comments: assist for LE management and truncal elevation  Transfers Overall transfer level: Needs assistance Equipment used: Rolling walker (2 wheeled) Transfers: Sit to/from Stand Sit to Stand: Mod assist         General transfer comment: cuing for hand placement; very slow, effortful movement transition.  Maintains L LE anterior to BOS with all movement transitions  Ambulation/Gait Ambulation/Gait assistance: Mod assist;+2 safety/equipment Ambulation Distance (Feet): 3 Feet Assistive device: Rolling walker (2 wheeled)       General  Gait Details: 3' x2 trials with RW, mod assist this date.  3 point step to gait pattern, requiring manual assist from therapist for L LE advancement this date.  Poor L LE stance time and weight acceptance, often placing weight through L forefoot only (not tolerating foot flat).  Very slow and effortful; limited by pain.  Spontaneously sitting after 3 steps during second trial of gait; mod assist to control and ensure safety on chair.   Stairs            Wheelchair Mobility    Modified Rankin (Stroke Patients Only)       Balance                                    Cognition                            Exercises Other Exercises Other Exercises: Toilet transfer, SPT with RW, mod assist for sit/stand, SPT and standing balance.   Other Exercises: Seated LE therex, 1x10-15, act assist ROM: ankle pumps, LAQs, marching, iso hip adduct, hip abduct.  Moderate difficulty with isolated activation of L LE musculature; limited by pain.    General Comments        Pertinent Vitals/Pain Pain Assessment: 0-10 Pain Score: 4  Pain Location: L LE Pain Descriptors / Indicators: Aching;Constant Pain Intervention(s):  (rest periods as needed; pain meds issued priro to session)    Home  Living                      Prior Function            PT Goals (current goals can now be found in the care plan section) Acute Rehab PT Goals Patient Stated Goal: to go to rehab PT Goal Formulation: With patient/family Time For Goal Achievement: 02/07/15 Potential to Achieve Goals: Good Progress towards PT goals: Progressing toward goals    Frequency  BID    PT Plan Current plan remains appropriate    Co-evaluation             End of Session Equipment Utilized During Treatment: Gait belt;Oxygen Activity Tolerance: Patient limited by pain Patient left: in chair;with call bell/phone within reach;with chair alarm set     Time: 0842-0920    Charges:  $Gait  Training: 8-22 mins $Therapeutic Exercise: 8-22 mins $Therapeutic Activity: 8-22 mins                    G Codes:      Drevon Plog H. Manson PasseyBrown, PT, DPT 01/26/2015 9:18 AM [phone]   Laury AxonKristen H Chaylee Ehrsam 01/26/2015, 9:13 AM

## 2015-01-29 ENCOUNTER — Encounter: Payer: Self-pay | Admitting: Orthopedic Surgery

## 2015-01-29 DIAGNOSIS — I10 Essential (primary) hypertension: Secondary | ICD-10-CM | POA: Diagnosis not present

## 2015-01-29 DIAGNOSIS — M1612 Unilateral primary osteoarthritis, left hip: Secondary | ICD-10-CM | POA: Diagnosis not present

## 2015-01-29 DIAGNOSIS — I480 Paroxysmal atrial fibrillation: Secondary | ICD-10-CM | POA: Diagnosis not present

## 2015-01-29 DIAGNOSIS — I890 Lymphedema, not elsewhere classified: Secondary | ICD-10-CM | POA: Diagnosis not present

## 2015-05-05 ENCOUNTER — Emergency Department: Payer: Medicare Other

## 2015-05-05 ENCOUNTER — Encounter: Payer: Self-pay | Admitting: Emergency Medicine

## 2015-05-05 ENCOUNTER — Observation Stay
Admission: EM | Admit: 2015-05-05 | Discharge: 2015-05-07 | Disposition: A | Discharge status: Home / Self Care | Payer: Medicare Other | Attending: Internal Medicine | Admitting: Internal Medicine

## 2015-05-05 DIAGNOSIS — Z8673 Personal history of transient ischemic attack (TIA), and cerebral infarction without residual deficits: Secondary | ICD-10-CM | POA: Insufficient documentation

## 2015-05-05 DIAGNOSIS — Z66 Do not resuscitate: Secondary | ICD-10-CM | POA: Diagnosis not present

## 2015-05-05 DIAGNOSIS — B029 Zoster without complications: Secondary | ICD-10-CM | POA: Insufficient documentation

## 2015-05-05 DIAGNOSIS — Z9889 Other specified postprocedural states: Secondary | ICD-10-CM | POA: Diagnosis not present

## 2015-05-05 DIAGNOSIS — E780 Pure hypercholesterolemia: Secondary | ICD-10-CM | POA: Diagnosis not present

## 2015-05-05 DIAGNOSIS — Z79899 Other long term (current) drug therapy: Secondary | ICD-10-CM | POA: Diagnosis not present

## 2015-05-05 DIAGNOSIS — I517 Cardiomegaly: Secondary | ICD-10-CM | POA: Insufficient documentation

## 2015-05-05 DIAGNOSIS — I890 Lymphedema, not elsewhere classified: Secondary | ICD-10-CM | POA: Diagnosis not present

## 2015-05-05 DIAGNOSIS — I10 Essential (primary) hypertension: Secondary | ICD-10-CM | POA: Diagnosis not present

## 2015-05-05 DIAGNOSIS — N179 Acute kidney failure, unspecified: Secondary | ICD-10-CM | POA: Diagnosis not present

## 2015-05-05 DIAGNOSIS — M79601 Pain in right arm: Secondary | ICD-10-CM | POA: Insufficient documentation

## 2015-05-05 DIAGNOSIS — E785 Hyperlipidemia, unspecified: Secondary | ICD-10-CM | POA: Diagnosis not present

## 2015-05-05 DIAGNOSIS — R0989 Other specified symptoms and signs involving the circulatory and respiratory systems: Secondary | ICD-10-CM | POA: Insufficient documentation

## 2015-05-05 DIAGNOSIS — I4891 Unspecified atrial fibrillation: Secondary | ICD-10-CM | POA: Diagnosis not present

## 2015-05-05 DIAGNOSIS — Z7982 Long term (current) use of aspirin: Secondary | ICD-10-CM | POA: Diagnosis not present

## 2015-05-05 DIAGNOSIS — R0600 Dyspnea, unspecified: Secondary | ICD-10-CM | POA: Insufficient documentation

## 2015-05-05 DIAGNOSIS — Z7951 Long term (current) use of inhaled steroids: Secondary | ICD-10-CM | POA: Diagnosis not present

## 2015-05-05 DIAGNOSIS — I447 Left bundle-branch block, unspecified: Secondary | ICD-10-CM | POA: Diagnosis not present

## 2015-05-05 DIAGNOSIS — Z96642 Presence of left artificial hip joint: Secondary | ICD-10-CM | POA: Diagnosis not present

## 2015-05-05 DIAGNOSIS — M542 Cervicalgia: Secondary | ICD-10-CM | POA: Diagnosis not present

## 2015-05-05 DIAGNOSIS — Z7901 Long term (current) use of anticoagulants: Secondary | ICD-10-CM | POA: Insufficient documentation

## 2015-05-05 DIAGNOSIS — E860 Dehydration: Secondary | ICD-10-CM | POA: Diagnosis not present

## 2015-05-05 DIAGNOSIS — M25511 Pain in right shoulder: Secondary | ICD-10-CM | POA: Insufficient documentation

## 2015-05-05 DIAGNOSIS — R079 Chest pain, unspecified: Secondary | ICD-10-CM | POA: Diagnosis not present

## 2015-05-05 LAB — COMPREHENSIVE METABOLIC PANEL
ALT: 33 U/L (ref 14–54)
AST: 37 U/L (ref 15–41)
Albumin: 4 g/dL (ref 3.5–5.0)
Alkaline Phosphatase: 199 U/L — ABNORMAL HIGH (ref 38–126)
Anion gap: 11 (ref 5–15)
BILIRUBIN TOTAL: 0.5 mg/dL (ref 0.3–1.2)
BUN: 34 mg/dL — AB (ref 6–20)
CALCIUM: 9.5 mg/dL (ref 8.9–10.3)
CO2: 29 mmol/L (ref 22–32)
CREATININE: 1.21 mg/dL — AB (ref 0.44–1.00)
Chloride: 99 mmol/L — ABNORMAL LOW (ref 101–111)
GFR calc non Af Amer: 39 mL/min — ABNORMAL LOW (ref >60)
GFR, EST AFRICAN AMERICAN: 45 mL/min — AB (ref >60)
Glucose, Bld: 117 mg/dL — ABNORMAL HIGH (ref 65–99)
Potassium: 4.1 mmol/L (ref 3.5–5.1)
SODIUM: 139 mmol/L (ref 135–145)
Total Protein: 6.9 g/dL (ref 6.5–8.1)

## 2015-05-05 LAB — CBC WITH DIFFERENTIAL/PLATELET
BASOS ABS: 0.1 10*3/uL (ref 0–0.1)
Basophils Relative: 1 %
Eosinophils Absolute: 0.1 10*3/uL (ref 0–0.7)
Eosinophils Relative: 2 %
HCT: 38.6 % (ref 35.0–47.0)
HEMOGLOBIN: 12.7 g/dL (ref 12.0–16.0)
LYMPHS ABS: 2.4 10*3/uL (ref 1.0–3.6)
LYMPHS PCT: 29 %
MCH: 29 pg (ref 26.0–34.0)
MCHC: 33 g/dL (ref 32.0–36.0)
MCV: 87.9 fL (ref 80.0–100.0)
Monocytes Absolute: 0.7 10*3/uL (ref 0.2–0.9)
Monocytes Relative: 8 %
NEUTROS ABS: 5.1 10*3/uL (ref 1.4–6.5)
NEUTROS PCT: 60 %
Platelets: 175 10*3/uL (ref 150–440)
RBC: 4.39 MIL/uL (ref 3.80–5.20)
RDW: 14.5 % (ref 11.5–14.5)
WBC: 8.5 10*3/uL (ref 3.6–11.0)

## 2015-05-05 LAB — TROPONIN I: Troponin I: 0.03 ng/mL (ref <0.031)

## 2015-05-05 LAB — PROTIME-INR
INR: 2.11
Prothrombin Time: 23.8 seconds — ABNORMAL HIGH (ref 11.4–15.0)

## 2015-05-05 MED ORDER — TRAMADOL HCL 50 MG PO TABS
50.0000 mg | ORAL_TABLET | Freq: Once | ORAL | Status: AC
  Administered 2015-05-05: 50 mg via ORAL
  Filled 2015-05-05: qty 1

## 2015-05-05 MED ORDER — NAPROXEN 500 MG PO TABS
250.0000 mg | ORAL_TABLET | Freq: Once | ORAL | Status: AC
  Administered 2015-05-05: 250 mg via ORAL
  Filled 2015-05-05: qty 1

## 2015-05-05 NOTE — ED Provider Notes (Signed)
[facility] Emergency Department Provider Note  ____________________________________________  Time seen: Seen upon arrival to the emergency department  I have reviewed the triage vital signs and the nursing notes.   HISTORY  Chief Complaint Arm Pain and Hypertension    HPI FOTINI Carson is a 81 y.o. female with a history of A. fib on Coumadin and hypertension who presents today with right shoulder and chest pain. She said she woke up with it this morning and has been aching and constant. It has been mild to moderate and worse with movement of the right shoulder. Says that she has minimal to no pain when not moving her right arm. Says that she thinks that it could be from muscle strain secondary to walking without shoes with her walker. She says that she recently had a hip replacement this past May and her legs are now unequal length. She fears that she is suffering from overuse of the right arm muscle secondary to the unevenness of her gait. Denies any nausea, vomiting, chest pain, shortness of breath or diaphoresis. Took 3 aspirins prior to arrival as well as using BenGay without any relief.The chest pain is mild at this time without any radiation. It is to the right side.   Past Medical History  Diagnosis Date  . Hypertension   . Dysrhythmia     Afib  . Hypercholesteremia   . Lymphedema     lower extremities  . Stroke     noted ischemia on CT scan  . Shingles   . Arthritis   . S/P total hip arthroplasty 01/25/2015    Patient Active Problem List   Diagnosis Date Noted  . S/P total hip arthroplasty 01/25/2015  . Primary osteoarthritis of left hip 01/23/2015    Past Surgical History  Procedure Laterality Date  . Joint replacement  01/23/15    Left THR Dr. Rosita Kea   . Total hip arthroplasty Left 01/23/2015    Procedure: TOTAL HIP ARTHROPLASTY ANTERIOR APPROACH;  Surgeon: Kennedy Bucker, MD;  Location: [facility] ORS;  Service: Orthopedics;  Laterality: Left;     Current Outpatient Rx  Name  Route  Sig  Dispense  Refill  . aspirin EC 81 MG EC tablet   Oral   Take 1 tablet (81 mg total) by mouth daily.   30 tablet   0   . fluticasone (FLONASE) 50 MCG/ACT nasal spray   Each Nare   Place 2 sprays into both nostrils daily as needed for rhinitis.          Marland Kitchen bisacodyl (DULCOLAX) 10 MG suppository   Rectal   Place 1 suppository (10 mg total) rectally daily as needed for moderate constipation.   12 suppository   0   . ferrous fumarate-b12-vitamic C-folic acid (TRINSICON / FOLTRIN) capsule   Oral   Take 1 capsule by mouth 2 (two) times daily after a meal.   30 capsule   0   . HYDROcodone-acetaminophen (NORCO/VICODIN) 5-325 MG per tablet   Oral   Take 1-2 tablets by mouth every 6 (six) hours as needed for moderate pain.   60 tablet   0   . ondansetron (ZOFRAN-ODT) 8 MG disintegrating tablet   Oral   Take 1 tablet (8 mg total) by mouth every 8 (eight) hours as needed for nausea or vomiting (Try 4 mg initially, and increase to 8 mg if no improvement).   20 tablet   0   . senna-docusate (SENOKOT-S) 8.6-50 MG per tablet  Oral   Take 1 tablet by mouth at bedtime as needed for mild constipation.   30 tablet   0   . triamcinolone cream (KENALOG) 0.5 %   Topical   Apply 1 application topically 2 (two) times daily.   30 g   0     Allergies Morphine and related  History reviewed. No pertinent family history.  Social History Social History  Substance Use Topics  . Smoking status: Never Smoker   . Smokeless tobacco: Never Used  . Alcohol Use: No    Review of Systems Constitutional: No fever/chills Eyes: No visual changes. ENT: No sore throat. Cardiovascular: Denies chest pain. Respiratory: Denies shortness of breath. Gastrointestinal: No abdominal pain.  No nausea, no vomiting.  No diarrhea.  No constipation. Genitourinary: Negative for dysuria. Musculoskeletal: Negative for back pain. Skin: Negative for  rash. Neurological: Negative for headaches, focal weakness or numbness.  10-point ROS otherwise negative.  ____________________________________________   PHYSICAL EXAM:  VITAL SIGNS: ED Triage Vitals  Enc Vitals Group     BP 05/05/15 2036 159/83 mmHg     Pulse Rate 05/05/15 2036 81     Resp 05/05/15 2036 16     Temp 05/05/15 2036 97.9 F (36.6 C)     Temp Source 05/05/15 2036 Oral     SpO2 05/05/15 2036 97 %     Weight 05/05/15 2036 150 lb (68.04 kg)     Height 05/05/15 2036 5\' 1"  (1.549 m)     Head Cir --      Peak Flow --      Pain Score 05/05/15 2042 0     Pain Loc --      Pain Edu? --      Excl. in GC? --     Constitutional: Alert and oriented. Well appearing and in no acute distress. Eyes: Conjunctivae are normal. PERRL. EOMI. Head: Atraumatic. Nose: No congestion/rhinnorhea. Mouth/Throat: Mucous membranes are moist.  Oropharynx non-erythematous. Neck: No stridor.   Cardiovascular: Normal rate, regular rhythm. Grossly normal heart sounds.  Good peripheral circulation. Chest pain to right side is reproducible to palpation. Respiratory: Normal respiratory effort.  No retractions. Lungs CTAB. Gastrointestinal: Soft and nontender. No distention. No abdominal bruits. No CVA tenderness. Musculoskeletal: Lateral lower extremity edema which the patient says is chronic and unchanged. Reduced range of motion of the right shoulder secondary to pain anteriorly. Sensate to light touch. Bilateral radial pulses which are equal. Strength distal to the right shoulder is 5 out of 5. Neurologic:  Normal speech and language. No gross focal neurologic deficits are appreciated. No gait instability. Skin:  Skin is warm, dry and intact. No rash noted. Psychiatric: Mood and affect are normal. Speech and behavior are normal.  ____________________________________________   LABS (all labs ordered are listed, but only abnormal results are displayed)  Labs Reviewed  COMPREHENSIVE METABOLIC  PANEL - Abnormal; Notable for the following:    Chloride 99 (*)    Glucose, Bld 117 (*)    BUN 34 (*)    Creatinine, Ser 1.21 (*)    Alkaline Phosphatase 199 (*)    GFR calc non Af Amer 39 (*)    GFR calc Af Amer 45 (*)    All other components within normal limits  PROTIME-INR - Abnormal; Notable for the following:    Prothrombin Time 23.8 (*)    All other components within normal limits  CBC WITH DIFFERENTIAL/PLATELET  TROPONIN I  TROPONIN I   ____________________________________________  EKG  ED  ECG REPORT I, Arelia Longest, the attending physician, personally viewed and interpreted this ECG.   Date: 05/05/2015  EKG Time: 2046  Rate: 75  Rhythm: normal sinus rhythm  Axis: Left axis deviation  Intervals:left bundle branch block  ST&T Change: T-wave inversion in aVL.  Biphasic T waves in V5 and V6. No ST elevations or depressions.  Left bundle branch block is seen on multiple old EKGs. Biphasic T wave morphology similar to EKG done on 04/19/2014.   ____________________________________________  RADIOLOGY  Cardiomegaly with mild pulmonary vascular congestion similar to prior exam. I personally reviewed these images. ____________________________________________   PROCEDURES   ____________________________________________   INITIAL IMPRESSION / ASSESSMENT AND PLAN / ED COURSE  Pertinent labs & imaging results that were available during my care of the patient were reviewed by me and considered in my medical decision making (see chart for details).  ----------------------------------------- 10 PM on 05/05/2015 -----------------------------------------  Patient reassessed and still with pain and right shoulder after Naprosyn. Will dose with tramadol. Patient still without any shortness of breath or worsening of her chest pain. Initial troponin of 0.03. We'll repeat. Last troponin done in January was at 0.02. Very atypical pain for cardiac pain. Resting  comfortable at this time and still without any pain unless she moves her right arm.. If second troponin is equal less than the initial then she'll be discharged home to follow up with orthopedist. Signed out to Dr. Carollee Massed area ____________________________________________   FINAL CLINICAL IMPRESSION(S) / ED DIAGNOSES  Final diagnoses:  Shoulder pain, acute, right   acute right-sided chest pain.    Myrna Blazer, MD 05/06/15 0040

## 2015-05-05 NOTE — ED Notes (Signed)
Pt to rm 24 via EMS from home, c/o right arm/shoulder/neck pain since this AM, and hypertension SBP in 180s this PM.  Pt reports hx afib, w/ ems left BBB.  EMS reports HR 80s, 97% RA, 161/95.  Pt NAD upon arrival.

## 2015-05-06 ENCOUNTER — Observation Stay
Admit: 2015-05-06 | Discharge: 2015-05-06 | Disposition: A | Discharge status: Home / Self Care | Payer: Medicare Other | Attending: Internal Medicine | Admitting: Internal Medicine

## 2015-05-06 DIAGNOSIS — N179 Acute kidney failure, unspecified: Secondary | ICD-10-CM | POA: Diagnosis present

## 2015-05-06 LAB — HEMOGLOBIN A1C: Hgb A1c MFr Bld: 5.7 % (ref 4.0–6.0)

## 2015-05-06 LAB — TROPONIN I
Troponin I: 0.16 ng/mL — ABNORMAL HIGH (ref <0.031)
Troponin I: 0.21 ng/mL — ABNORMAL HIGH (ref <0.031)

## 2015-05-06 LAB — TSH: TSH: 3.4 u[IU]/mL (ref 0.350–4.500)

## 2015-05-06 MED ORDER — PREDNISONE 20 MG PO TABS
40.0000 mg | ORAL_TABLET | Freq: Once | ORAL | Status: AC
  Administered 2015-05-06: 40 mg via ORAL
  Filled 2015-05-06: qty 2

## 2015-05-06 MED ORDER — FLUTICASONE PROPIONATE 50 MCG/ACT NA SUSP: 2.0000 | Freq: Every day | NASAL | Status: DC | PRN

## 2015-05-06 MED ORDER — SODIUM CHLORIDE 0.9 % IV SOLN
INTRAVENOUS | Status: DC
  Administered 2015-05-06 (×3): via INTRAVENOUS

## 2015-05-06 MED ORDER — HYDROCHLOROTHIAZIDE 25 MG PO TABS
12.5000 mg | ORAL_TABLET | Freq: Every day | ORAL | Status: DC
  Administered 2015-05-06: 12.5 mg via ORAL
  Filled 2015-05-06: qty 1

## 2015-05-06 MED ORDER — LOSARTAN POTASSIUM 50 MG PO TABS
50.0000 mg | ORAL_TABLET | Freq: Every day | ORAL | Status: DC
  Administered 2015-05-06: 50 mg via ORAL
  Filled 2015-05-06: qty 1

## 2015-05-06 MED ORDER — WARFARIN SODIUM 4 MG PO TABS
4.0000 mg | ORAL_TABLET | Freq: Every day | ORAL | Status: DC
  Administered 2015-05-06 – 2015-05-07 (×2): 4 mg via ORAL
  Filled 2015-05-06 (×2): qty 1

## 2015-05-06 MED ORDER — ACETAMINOPHEN 650 MG RE SUPP: 650.0000 mg | Freq: Four times a day (QID) | RECTAL | Status: DC | PRN

## 2015-05-06 MED ORDER — SIMVASTATIN 20 MG PO TABS
20.0000 mg | ORAL_TABLET | Freq: Every evening | ORAL | Status: DC
  Administered 2015-05-06: 20 mg via ORAL
  Filled 2015-05-06: qty 1

## 2015-05-06 MED ORDER — SODIUM CHLORIDE 0.9 % IJ SOLN: 3.0000 mL | INTRAMUSCULAR | Status: DC | PRN

## 2015-05-06 MED ORDER — ADULT MULTIVITAMIN W/MINERALS CH
1.0000 | ORAL_TABLET | Freq: Every day | ORAL | Status: DC
  Administered 2015-05-06 – 2015-05-07 (×2): 1 via ORAL
  Filled 2015-05-06 (×3): qty 1

## 2015-05-06 MED ORDER — LABETALOL HCL 5 MG/ML IV SOLN
5.0000 mg | Freq: Once | INTRAVENOUS | Status: AC
  Administered 2015-05-06: 5 mg via INTRAVENOUS
  Filled 2015-05-06: qty 4

## 2015-05-06 MED ORDER — DOCUSATE SODIUM 100 MG PO CAPS
100.0000 mg | ORAL_CAPSULE | Freq: Two times a day (BID) | ORAL | Status: DC
  Administered 2015-05-06 – 2015-05-07 (×3): 100 mg via ORAL
  Filled 2015-05-06 (×3): qty 1

## 2015-05-06 MED ORDER — SODIUM CHLORIDE 0.9 % IJ SOLN: 3.0000 mL | Freq: Two times a day (BID) | INTRAMUSCULAR | Status: DC

## 2015-05-06 MED ORDER — LOSARTAN POTASSIUM 50 MG PO TABS
100.0000 mg | ORAL_TABLET | Freq: Every day | ORAL | Status: DC
  Administered 2015-05-07: 100 mg via ORAL
  Filled 2015-05-06: qty 2

## 2015-05-06 MED ORDER — ASPIRIN EC 81 MG PO TBEC
81.0000 mg | DELAYED_RELEASE_TABLET | Freq: Every day | ORAL | Status: DC
  Administered 2015-05-06 – 2015-05-07 (×2): 81 mg via ORAL
  Filled 2015-05-06 (×2): qty 1

## 2015-05-06 MED ORDER — ACETAMINOPHEN 325 MG PO TABS: 650.0000 mg | ORAL_TABLET | Freq: Four times a day (QID) | ORAL | Status: DC | PRN

## 2015-05-06 NOTE — Progress Notes (Signed)
*  PRELIMINARY RESULTS* Echocardiogram 2D Echocardiogram has been performed.  Garrel Ridgel Stills 05/06/2015, 2:22 PM

## 2015-05-06 NOTE — Progress Notes (Addendum)
Received update from Ty in ECHO that ECHO with bubble study will be postponed until tomorrow d/t need of special procedures RN who does not come in on the weekend.   Update 9:15 AM: Dr. Allena Katz notified and per dr patel, patient does not need. He will cancel and order a diet for patient.

## 2015-05-06 NOTE — Consult Note (Signed)
[facility] CLINIC CARDIOLOGY [facility]  CARDIOLOGY CONSULT NOTE  Patient ID: Colleen Carson MRN: [id number] DOB/AGE: [birth year] 79 y.o.  Admit date: 05/05/2015 Referring Physician patel Primary Physician  Primary Cardiologist Thaer Miyoshi Reason for Consultation  Atypical chest pain  HPI: Patient is an 81 year old female with history of hypertension, atrial fibrillation, lymphedema with previous CVA in the past admitted with complaints of right arm pain.  She denies any chest pain.  Electrocardiogram revealed no ischemia.  Her serum troponin was minimally elevated at 0.21. She denies an exertional component but complains of right arm pain which again to occur when using a walker after injuring her left hip.  She feels somewhat better since her admission.  ROS Review of Systems - History obtained from chart review and the patient General ROS: positive for  - Right arm pain Respiratory ROS: no cough, shortness of breath, or wheezing Cardiovascular ROS: positive for - chest pain Gastrointestinal ROS: no abdominal pain, change in bowel habits, or black or bloody stools Musculoskeletal ROS: positive for - muscle pain Neurological ROS: no TIA or stroke symptoms   Past Medical History  Diagnosis Date  . Hypertension   . Dysrhythmia     Afib  . Hypercholesteremia   . Lymphedema     lower extremities  . Stroke     noted ischemia on CT scan  . Shingles   . Arthritis   . S/P total hip arthroplasty 01/25/2015    History reviewed. No pertinent family history.  Social History   Social History  . Marital Status: Married    Spouse Name: N/A  . Number of Children: N/A  . Years of Education: N/A   Occupational History  . Not on file.   Social History Main Topics  . Smoking status: Never Smoker   . Smokeless tobacco: Never Used  . Alcohol Use: No  . Drug Use: No  . Sexual Activity: No   Other Topics Concern  . Not on file   Social History Narrative    Past Surgical History   Procedure Laterality Date  . Joint replacement  01/23/15    Left THR Dr. Rosita Kea   . Total hip arthroplasty Left 01/23/2015    Procedure: TOTAL HIP ARTHROPLASTY ANTERIOR APPROACH;  Surgeon: Kennedy Bucker, MD;  Location: [facility] ORS;  Service: Orthopedics;  Laterality: Left;     Prescriptions prior to admission  Medication Sig Dispense Refill Last Dose  . aspirin EC 81 MG EC tablet Take 1 tablet (81 mg total) by mouth daily. 30 tablet 0 05/05/2015 at Unknown time  . fluticasone (FLONASE) 50 MCG/ACT nasal spray Place 2 sprays into both nostrils daily as needed for rhinitis.    Past Week at Unknown time  . hydrochlorothiazide (HYDRODIURIL) 12.5 MG tablet Take 1 tablet by mouth daily.   05/05/2015 at Unknown time  . losartan (COZAAR) 50 MG tablet Take 1 tablet by mouth daily.   05/05/2015 at Unknown time  . Multiple Vitamins-Minerals (CENTRUM SILVER PO) Take 1 tablet by mouth daily.   05/05/2015 at Unknown time  . simvastatin (ZOCOR) 20 MG tablet Take 1 tablet by mouth every evening.   05/04/2015 at Unknown time  . warfarin (COUMADIN) 2 MG tablet Take 2 tablets by mouth daily.   05/05/2015 at Unknown time  . ferrous fumarate-b12-vitamic C-folic acid (TRINSICON / FOLTRIN) capsule Take 1 capsule by mouth 2 (two) times daily after a meal. (Patient not taking: Reported on 05/06/2015) 30 capsule 0   . HYDROcodone-acetaminophen (NORCO/VICODIN)  5-325 MG per tablet Take 1-2 tablets by mouth every 6 (six) hours as needed for moderate pain. (Patient not taking: Reported on 05/06/2015) 60 tablet 0   . ondansetron (ZOFRAN-ODT) 8 MG disintegrating tablet Take 1 tablet (8 mg total) by mouth every 8 (eight) hours as needed for nausea or vomiting (Try 4 mg initially, and increase to 8 mg if no improvement). (Patient not taking: Reported on 05/06/2015) 20 tablet 0   . senna-docusate (SENOKOT-S) 8.6-50 MG per tablet Take 1 tablet by mouth at bedtime as needed for mild constipation. (Patient not taking: Reported on 05/06/2015) 30 tablet 0    . triamcinolone cream (KENALOG) 0.5 % Apply 1 application topically 2 (two) times daily. (Patient not taking: Reported on 05/06/2015) 30 g 0     Physical Exam: Blood pressure 163/72, pulse 58, temperature 97.5 F (36.4 C), temperature source Oral, resp. rate 14, height 5\' 1"  (1.549 m), weight 68.539 kg (151 lb 1.6 oz), SpO2 94 %.    General appearance: alert and cooperative Resp: clear to auscultation bilaterally Cardio: regular rate and rhythm, S1, S2 normal, no murmur, click, rub or gallop GI: soft, non-tender; bowel sounds normal; no masses,  no organomegaly Extremities: extremities normal, atraumatic, no cyanosis or edema Neurologic: Grossly normal Labs:   Lab Results  Component Value Date   WBC 8.5 05/05/2015   HGB 12.7 05/05/2015   HCT 38.6 05/05/2015   MCV 87.9 05/05/2015   PLT 175 05/05/2015    Recent Labs Lab 05/05/15 2049  NA 139  K 4.1  CL 99*  CO2 29  BUN 34*  CREATININE 1.21*  CALCIUM 9.5  PROT 6.9  BILITOT 0.5  ALKPHOS 199*  ALT 33  AST 37  GLUCOSE 117*   Lab Results  Component Value Date   TROPONINI 0.21* 05/06/2015      Radiology:   No acute process EKG:  Sinus rhythm with left bundle-branch block  ASSESSMENT AND PLAN:   Patient's history of atrial fibrillation in was admitted with chest and right arm pain.  She had a borderline troponin elevation.  Her symptoms are very atypical for angina.  Would continue follow on current regimen.  Would not proceed with cardiac catheterization or functional study.  If patient is stable morning would consider ambulation and discharge to home with outpatient follow-up. Signed: Dalia Heading MD, [facility] 05/06/2015, 7:20 PM

## 2015-05-06 NOTE — H&P (Signed)
Colleen Carson is an 81 y.o. female.   Chief Complaint: Right arm pain HPI: Patient presents to the hospital complaining of pain in her right arm. She denies injuring the arm. Didn't laboratory evaluation emergency department revealed a significant elevation in troponin on the second assessment. The patient denies chest pain, shortness of breath, nausea, vomiting or diaphoresis she admits that she had a strange sensation in the middle of her chest substernally that felt like "bubbles" as if she may need to burp. The patient's arm specifically hurts with shoulder flexion. Due to her increase in troponin emergency department staff called for admission  Past Medical History  Diagnosis Date  . Hypertension   . Dysrhythmia     Afib  . Hypercholesteremia   . Lymphedema     lower extremities  . Stroke     noted ischemia on CT scan  . Shingles   . Arthritis   . S/P total hip arthroplasty 01/25/2015    Past Surgical History  Procedure Laterality Date  . Joint replacement  01/23/15    Left THR Dr. Rudene Christians   . Total hip arthroplasty Left 01/23/2015    Procedure: TOTAL HIP ARTHROPLASTY ANTERIOR APPROACH;  Surgeon: Hessie Knows, MD;  Location: [facility] ORS;  Service: Orthopedics;  Laterality: Left;    History reviewed. No pertinent family history. Social History:  reports that she has never smoked. She has never used smokeless tobacco. She reports that she does not drink alcohol or use illicit drugs.  Allergies:  Allergies  Allergen Reactions  . Morphine And Related Nausea Only    Medications Prior to Admission  Medication Sig Dispense Refill  . aspirin EC 81 MG EC tablet Take 1 tablet (81 mg total) by mouth daily. 30 tablet 0  . fluticasone (FLONASE) 50 MCG/ACT nasal spray Place 2 sprays into both nostrils daily as needed for rhinitis.     . hydrochlorothiazide (HYDRODIURIL) 12.5 MG tablet Take 1 tablet by mouth daily.    Marland Kitchen losartan (COZAAR) 50 MG tablet Take 1 tablet by mouth daily.    . Multiple  Vitamins-Minerals (CENTRUM SILVER PO) Take 1 tablet by mouth daily.    . simvastatin (ZOCOR) 20 MG tablet Take 1 tablet by mouth every evening.    . warfarin (COUMADIN) 2 MG tablet Take 2 tablets by mouth daily.    . ferrous [id number]-[id number] C-folic acid (TRINSICON / FOLTRIN) capsule Take 1 capsule by mouth 2 (two) times daily after a meal. (Patient not taking: Reported on 05/06/2015) 30 capsule 0  . HYDROcodone-acetaminophen (NORCO/VICODIN) 5-325 MG per tablet Take 1-2 tablets by mouth every 6 (six) hours as needed for moderate pain. (Patient not taking: Reported on 05/06/2015) 60 tablet 0  . ondansetron (ZOFRAN-ODT) 8 MG disintegrating tablet Take 1 tablet (8 mg total) by mouth every 8 (eight) hours as needed for nausea or vomiting (Try 4 mg initially, and increase to 8 mg if no improvement). (Patient not taking: Reported on 05/06/2015) 20 tablet 0  . senna-docusate (SENOKOT-S) 8.6-50 MG per tablet Take 1 tablet by mouth at bedtime as needed for mild constipation. (Patient not taking: Reported on 05/06/2015) 30 tablet 0  . triamcinolone cream (KENALOG) 0.5 % Apply 1 application topically 2 (two) times daily. (Patient not taking: Reported on 05/06/2015) 30 g 0    Results for orders placed or performed during the hospital encounter of 05/05/15 (from the past 48 hour(s))  CBC with Differential     Status: None   Collection Time: 05/05/15  8:49  PM  Result Value Ref Range   WBC 8.5 3.6 - 11.0 K/uL   RBC 4.39 3.80 - 5.20 MIL/uL   Hemoglobin 12.7 12.0 - 16.0 g/dL   HCT 38.6 35.0 - 47.0 %   MCV 87.9 80.0 - 100.0 fL   MCH 29.0 26.0 - 34.0 pg   MCHC 33.0 32.0 - 36.0 g/dL   RDW 14.5 11.5 - 14.5 %   Platelets 175 150 - 440 K/uL   Neutrophils Relative % 60 %   Neutro Abs 5.1 1.4 - 6.5 K/uL   Lymphocytes Relative 29 %   Lymphs Abs 2.4 1.0 - 3.6 K/uL   Monocytes Relative 8 %   Monocytes Absolute 0.7 0.2 - 0.9 K/uL   Eosinophils Relative 2 %   Eosinophils Absolute 0.1 0 - 0.7 K/uL   Basophils  Relative 1 %   Basophils Absolute 0.1 0 - 0.1 K/uL  Comprehensive metabolic panel     Status: Abnormal   Collection Time: 05/05/15  8:49 PM  Result Value Ref Range   Sodium 139 135 - 145 mmol/L   Potassium 4.1 3.5 - 5.1 mmol/L   Chloride 99 (L) 101 - 111 mmol/L   CO2 29 22 - 32 mmol/L   Glucose, Bld 117 (H) 65 - 99 mg/dL   BUN 34 (H) 6 - 20 mg/dL   Creatinine, Ser 1.21 (H) 0.44 - 1.00 mg/dL   Calcium 9.5 8.9 - 10.3 mg/dL   Total Protein 6.9 6.5 - 8.1 g/dL   Albumin 4.0 3.5 - 5.0 g/dL   AST 37 15 - 41 U/L   ALT 33 14 - 54 U/L   Alkaline Phosphatase 199 (H) 38 - 126 U/L   Total Bilirubin 0.5 0.3 - 1.2 mg/dL   GFR calc non Af Amer 39 (L) >60 mL/min   GFR calc Af Amer 45 (L) >60 mL/min    Comment: (NOTE) The eGFR has been calculated using the CKD EPI equation. This calculation has not been validated in all clinical situations. eGFR's persistently <60 mL/min signify possible Chronic Kidney Disease.    Anion gap 11 5 - 15  Troponin I     Status: None   Collection Time: 05/05/15  8:49 PM  Result Value Ref Range   Troponin I 0.03 <0.031 ng/mL    Comment:        NO INDICATION OF MYOCARDIAL INJURY.   Protime-INR     Status: Abnormal   Collection Time: 05/05/15  8:49 PM  Result Value Ref Range   Prothrombin Time 23.8 (H) 11.4 - 15.0 seconds   INR 2.11   TSH     Status: None   Collection Time: 05/05/15  8:49 PM  Result Value Ref Range   TSH 3.400 0.350 - 4.500 uIU/mL  Troponin I     Status: Abnormal   Collection Time: 05/06/15 12:30 AM  Result Value Ref Range   Troponin I 0.16 (H) <0.031 ng/mL    Comment: READ BACK AND VERIFIED WITH CHRISTINE KIM AT 0102 05/06/15.PMH        PERSISTENTLY INCREASED TROPONIN VALUES IN THE RANGE OF 0.04-0.49 ng/mL CAN BE SEEN IN:       -UNSTABLE ANGINA       -CONGESTIVE HEART FAILURE       -MYOCARDITIS       -CHEST TRAUMA       -ARRYHTHMIAS       -LATE PRESENTING MYOCARDIAL INFARCTION       -COPD   CLINICAL FOLLOW-UP RECOMMENDED.  Dg  Chest 1 View  05/05/2015   CLINICAL DATA:  Right arm shoulder and neck pain.  Chest pain.  EXAM: CHEST  1 VIEW  COMPARISON:  One-view chest x-ray 04/19/2014  FINDINGS: The heart is mildly enlarged. Mild pulmonary vascular congestion is evident without frank edema. A granuloma is again noted at the right base. Atherosclerotic calcifications are present in the aortic arch.  IMPRESSION: 1. Cardiomegaly and mild pulmonary vascular congestion, similar to the prior exam. 2. No other acute cardiopulmonary disease.   Electronically Signed   By: San Morelle M.D.   On: 05/05/2015 21:11    Review of Systems  Constitutional: Negative for fever and chills.  HENT: Negative for sore throat and tinnitus.   Eyes: Negative for blurred vision and redness.  Respiratory: Negative for cough and shortness of breath.   Cardiovascular: Negative for chest pain, palpitations, orthopnea and PND.  Gastrointestinal: Negative for nausea, vomiting, abdominal pain and diarrhea.  Genitourinary: Negative for dysuria, urgency and frequency.  Musculoskeletal: Negative for myalgias and joint pain.       Right arm pain  Skin: Negative for rash.       No lesions  Neurological: Positive for dizziness. Negative for speech change, focal weakness and weakness.  Endo/Heme/Allergies: Does not bruise/bleed easily.       No temperature intolerance  Psychiatric/Behavioral: Negative for depression and suicidal ideas.    Blood pressure 156/76, pulse 66, temperature 97.7 F (36.5 C), temperature source Oral, resp. rate 18, height 5' 1" (1.549 m), weight 68.539 kg (151 lb 1.6 oz), SpO2 94 %. Physical Exam  Nursing note and vitals reviewed. Constitutional: She is oriented to person, place, and time. She appears well-developed and well-nourished. No distress.  HENT:  Head: Normocephalic and atraumatic.  Mouth/Throat: Oropharynx is clear and moist.  Eyes: Conjunctivae and EOM are normal. Pupils are equal, round, and reactive to  light. No scleral icterus.  Neck: Normal range of motion. Neck supple. No JVD present. No tracheal deviation present. No thyromegaly present.  No carotid bruits  Cardiovascular: Normal rate, regular rhythm and normal heart sounds.  Exam reveals no gallop and no friction rub.   No murmur heard. Respiratory: Effort normal and breath sounds normal.  GI: Soft. Bowel sounds are normal. She exhibits no distension. There is no tenderness.  Genitourinary:  Deferred  Musculoskeletal: She exhibits edema (RLE>LLE (chronic)).       Arms: Lymphadenopathy:    She has no cervical adenopathy.  Neurological: She is alert and oriented to person, place, and time. No cranial nerve deficit. She exhibits normal muscle tone.  Skin: Skin is warm and dry. No rash noted. No erythema.  Psychiatric: She has a normal mood and affect. Her behavior is normal. Judgment and thought content normal.     Assessment/Plan This is a 81 year old Caucasian female admitted for acute kidney injury as well as elevated troponin. 1. Acute kidney injury: Dehydration. We will aggressively hydrate the patient with intravenous fluid and avoid nephrotoxic drugs. Nephrology consultation at the discretion of primary team 2. Elevated troponin: Likely secondary to decreased clearance. EKG without changes. (Note, there is an old left bundle branch block). Continue follow cardiac enzymes. Cardiology consult placed. 3. Essential hypertension: Continue home regimen 4. Right upper extremity pain: Extremely atypical for cardiac chest pain. Pain is reproducible indicating musculoskeletal strain 5. DVT prophylaxis: Heparin 6. GI prophylaxis: None The patient is a DO NOT RESUSCITATE. Time spent on admission orders and patient care approximately 35 minutes  Marcille Blanco,  Norva Riffle 05/06/2015, 3:22 AM

## 2015-05-06 NOTE — Progress Notes (Signed)
[facility]                                                                                                                                                                                            Patient Demographics   Colleen Carson, is a 81 y.o. female, DOB - 01-13-1925, WUJ:[id number]  Admit date - 05/05/2015   Admitting Physician Arnaldo Natal, MD  Outpatient Primary MD for the patient is [name], Lavada Mesi, MD   LOS -   Subjective: Patient admitted with right arm pain and heaviness also some substernal chest discomfort and noted to have elevated creatinine and elevated troponin. Feels better     Review of Systems:   CONSTITUTIONAL: No documented fever. No fatigue, weakness. No weight gain, no weight loss.  EYES: No blurry or double vision.  ENT: No tinnitus. No postnasal drip. No redness of the oropharynx.  RESPIRATORY: No cough, no wheeze, no hemoptysis. No dyspnea.  CARDIOVASCULAR: No chest pain. No orthopnea. No palpitations. No syncope.  GASTROINTESTINAL: No nausea, no vomiting or diarrhea. No abdominal pain. No melena or hematochezia.  GENITOURINARY: No dysuria or hematuria.  ENDOCRINE: No polyuria or nocturia. No heat or cold intolerance.  HEMATOLOGY: No anemia. No bruising. No bleeding.  INTEGUMENTARY: No rashes. No lesions.  MUSCULOSKELETAL: No arthritis. No swelling. No gout.  NEUROLOGIC: No numbness, tingling, or ataxia. No seizure-type activity.  PSYCHIATRIC: No anxiety. No insomnia. No ADD.    Vitals:   Filed Vitals:   05/06/15 0130 05/06/15 0200 05/06/15 0243 05/06/15 1202  BP: 151/75 161/87 156/76 163/72  Pulse: 68 70 66 58  Temp:   97.7 F (36.5 C) 97.5 F (36.4 C)  TempSrc:   Oral Oral  Resp: 22 15 18 14   Height:      Weight:   68.539 kg (151 lb 1.6 oz)   SpO2: 97% 95% 94% 94%    Wt Readings from Last 3 Encounters:  05/06/15 68.539 kg (151 lb 1.6 oz)  01/23/15 65.772 kg (145 lb)  01/10/15 68.947  kg (152 lb)     Intake/Output Summary (Last 24 hours) at 05/06/15 1240 Last data filed at 05/06/15 1234  Gross per 24 hour  Intake      0 ml  Output   1000 ml  Net  -1000 ml    Physical Exam:   GENERAL: Pleasant-appearing in no apparent distress.  HEAD, EYES, EARS, NOSE AND THROAT: Atraumatic, normocephalic. Extraocular muscles are intact. Pupils equal and reactive to light. Sclerae anicteric. No conjunctival injection. No oro-pharyngeal erythema.  NECK: Supple. There is no jugular venous distention.  No bruits, no lymphadenopathy, no thyromegaly.  HEART: Regular rate and rhythm,. No murmurs, no rubs, no clicks.  LUNGS: Clear to auscultation bilaterally. No rales or rhonchi. No wheezes.  ABDOMEN: Soft, flat, nontender, nondistended. Has good bowel sounds. No hepatosplenomegaly appreciated.  EXTREMITIES: No evidence of any cyanosis, clubbing, or peripheral edema.  +2 pedal and radial pulses bilaterally.  NEUROLOGIC: The patient is alert, awake, and oriented x3 with no focal motor or sensory deficits appreciated bilaterally.  SKIN: Moist and warm with no rashes appreciated.  Psych: Not anxious, depressed LN: No inguinal LN enlargement    Antibiotics   Anti-infectives    None      Medications   Scheduled Meds: . aspirin EC  81 mg Oral Daily  . docusate sodium  100 mg Oral BID  . losartan  50 mg Oral Daily  . multivitamin with minerals  1 tablet Oral Daily  . simvastatin  20 mg Oral QPM  . warfarin  4 mg Oral Daily   Continuous Infusions: . sodium chloride 100 mL/hr at 05/06/15 1236   PRN Meds:.acetaminophen **OR** acetaminophen, fluticasone, sodium chloride   Data Review:   Micro Results No results found for this or any previous visit (from the past 240 hour(s)).  Radiology Reports Dg Chest 1 View  05/05/2015   CLINICAL DATA:  Right arm shoulder and neck pain.  Chest pain.  EXAM: CHEST  1 VIEW  COMPARISON:  One-view chest x-ray 04/19/2014  FINDINGS: The heart is  mildly enlarged. Mild pulmonary vascular congestion is evident without frank edema. A granuloma is again noted at the right base. Atherosclerotic calcifications are present in the aortic arch.  IMPRESSION: 1. Cardiomegaly and mild pulmonary vascular congestion, similar to the prior exam. 2. No other acute cardiopulmonary disease.   Electronically Signed   By: Marin Roberts M.D.   On: 05/05/2015 21:11     CBC  Recent Labs Lab 05/05/15 2049  WBC 8.5  HGB 12.7  HCT 38.6  PLT 175  MCV 87.9  MCH 29.0  MCHC 33.0  RDW 14.5  LYMPHSABS 2.4  MONOABS 0.7  EOSABS 0.1  BASOSABS 0.1    Chemistries   Recent Labs Lab 05/05/15 2049  NA 139  K 4.1  CL 99*  CO2 29  GLUCOSE 117*  BUN 34*  CREATININE 1.21*  CALCIUM 9.5  AST 37  ALT 33  ALKPHOS 199*  BILITOT 0.5   ------------------------------------------------------------------------------------------------------------------ estimated creatinine clearance is 28.5 mL/min (by C-G formula based on Cr of 1.21). ------------------------------------------------------------------------------------------------------------------ No results for input(s): HGBA1C in the last 72 hours. ------------------------------------------------------------------------------------------------------------------ No results for input(s): CHOL, HDL, LDLCALC, TRIG, CHOLHDL, LDLDIRECT in the last 72 hours. ------------------------------------------------------------------------------------------------------------------  Recent Labs  05/05/15 2049  TSH 3.400   ------------------------------------------------------------------------------------------------------------------ No results for input(s): VITAMINB12, FOLATE, FERRITIN, TIBC, IRON, RETICCTPCT in the last 72 hours.  Coagulation profile  Recent Labs Lab 05/05/15 2049  INR 2.11    No results for input(s): DDIMER in the last 72 hours.  Cardiac Enzymes  Recent Labs Lab 05/05/15 2049  05/06/15 0030 05/06/15 0633  TROPONINI 0.03 0.16* 0.21*   ------------------------------------------------------------------------------------------------------------------ Invalid input(s): POCBNP    Assessment & Plan   This is a 81 year old Caucasian female admitted for acute kidney injury as well as elevated troponin. 1. Acute kidney injury: Dehydration.  Discontinue HCTZ and monitor renal function 2. Elevated troponin: Likely secondary to decreased clearance. EKG without changes.  Her primary cardiologist has been consulted echocardiogram also has been ordered. Continue aspirin  3. Essential  hypertension poorly controlled I will increase her Cozaar 4. Right upper extremity pain: Extremely atypical for cardiac chest pain. Pain is reproducible indicating musculoskeletal strain possible rotator cuff, bursitis try prednisone 5. DVT prophylaxis: Heparin 6. GI prophylaxis: None The patient is a DO NOT RESUSCITATE. Time spent on admission orders and patient care approximately 35 minutes      Code Status Orders        Start     Ordered   05/06/15 0302  Do not attempt resuscitation (DNR)   Continuous    Question Answer Comment  In the event of cardiac or respiratory ARREST Do not call a "code blue"   In the event of cardiac or respiratory ARREST Do not perform Intubation, CPR, defibrillation or ACLS   In the event of cardiac or respiratory ARREST Use medication by any route, position, wound care, and other measures to relive pain and suffering. May use oxygen, suction and manual treatment of airway obstruction as needed for comfort.      05/06/15 0301    Advance Directive Documentation        Most Recent Value   Type of Advance Directive  Living will   Pre-existing out of facility DNR order (yellow form or pink MOST form)     "MOST" Form in Place?             Consults  cardiology DVT Prophylaxis aspirin  Lab Results  Component Value Date   PLT 175 05/05/2015      Time Spent in minutes  45 minutes .   Auburn Bilberry M.D on 05/06/2015 at 12:40 PM  Between 7am to 6pm - Pager - [phone]  After 6pm go to [email or web address] - password [email or web address] [facility]  [facility]   Office  [phone]

## 2015-05-06 NOTE — ED Provider Notes (Signed)
-----------------------------------------   1:16 AM on 05/06/2015 -----------------------------------------  I accepted signout from Dr. Langston Masker for this 81 year old female with a history of A. fib and hypertension who is been having right shoulder pain and chest pain.  The plan was to check a serial troponin level. Her initial troponin was 0.03. Her troponin level collected at 12:30 as elevated to 0.16.  I have reevaluated the patient. She is alert, pleasant, and in no acute distress. She seems slightly tangential or mildly confused as she focuses on pain in her right leg up into her right hip and pain in her right shoulder. She reports some sensation of burping or pressure in her chest, but otherwise denies any chest pain.  I have reviewed the lab results with her and the need for further cardiac evaluation. I've also discussed this with her family who is present in the room.  I have spoken with Dr. Sheryle Hail to have the patient further evaluated for admission to the hospital.  Final diagnosis: Abnormal troponin, chest pain, right shoulder pain  Darien Ramus, MD 05/06/15 [phone]

## 2015-05-07 DIAGNOSIS — N179 Acute kidney failure, unspecified: Secondary | ICD-10-CM | POA: Diagnosis not present

## 2015-05-07 LAB — BASIC METABOLIC PANEL
Anion gap: 9 (ref 5–15)
BUN: 24 mg/dL — AB (ref 6–20)
CALCIUM: 8.8 mg/dL — AB (ref 8.9–10.3)
CHLORIDE: 106 mmol/L (ref 101–111)
CO2: 25 mmol/L (ref 22–32)
CREATININE: 0.87 mg/dL (ref 0.44–1.00)
GFR, EST NON AFRICAN AMERICAN: 58 mL/min — AB (ref >60)
Glucose, Bld: 115 mg/dL — ABNORMAL HIGH (ref 65–99)
Potassium: 4.4 mmol/L (ref 3.5–5.1)
SODIUM: 140 mmol/L (ref 135–145)

## 2015-05-07 LAB — PROTIME-INR
INR: 2.38
Prothrombin Time: 26.1 seconds — ABNORMAL HIGH (ref 11.4–15.0)

## 2015-05-07 MED ORDER — WARFARIN - PHYSICIAN DOSING INPATIENT: Freq: Every day | Status: DC

## 2015-05-07 MED ORDER — LOSARTAN POTASSIUM 50 MG PO TABS: 75.0000 mg | ORAL_TABLET | Freq: Every day | ORAL | Status: DC

## 2015-05-07 NOTE — Progress Notes (Signed)
Patient is discharge in a stable condition, pain free at time of discharge, summary and f/u care given to daughter and pt, verbalized understanding, left with daughter

## 2015-05-07 NOTE — Discharge Instructions (Signed)
°  DIET:  °Cardiac diet ° °DISCHARGE CONDITION:  °Good ° °ACTIVITY:  °Activity as tolerated ° °OXYGEN:  °Home Oxygen: No. °  °Oxygen Delivery: room air ° °DISCHARGE LOCATION:  °home  ° ° °ADDITIONAL DISCHARGE INSTRUCTION: ° ° °If you experience worsening of your admission symptoms, develop shortness of breath, life threatening emergency, suicidal or homicidal thoughts you must seek medical attention immediately by calling 911 or calling your MD immediately  if symptoms less severe. ° °You Must read complete instructions/literature along with all the possible adverse reactions/side effects for all the Medicines you take and that have been prescribed to you. Take any new Medicines after you have completely understood and accpet all the possible adverse reactions/side effects.  ° °Please note ° °You were cared for by a hospitalist during your hospital stay. If you have any questions about your discharge medications or the care you received while you were in the hospital after you are discharged, you can call the unit and asked to speak with the hospitalist on call if the hospitalist that took care of you is not available. Once you are discharged, your primary care physician will handle any further medical issues. Please note that NO REFILLS for any discharge medications will be authorized once you are discharged, as it is imperative that you return to your primary care physician (or establish a relationship with a primary care physician if you do not have one) for your aftercare needs so that they can reassess your need for medications and monitor your lab values. ° ° °

## 2015-05-07 NOTE — Discharge Summary (Signed)
Colleen Carson, 81 y.o., DOB [birth year], MRN [id number]. Admission date: 05/05/2015 Discharge Date 05/07/2015 Primary MD [name], Lavada Mesi, MD Admitting Physician Arnaldo Natal, MD  Admission Diagnosis  Shoulder pain, acute, right [M25.511]  Discharge Diagnosis   Active Problems:   AKI (acute kidney injury)   right shoulder pain noncardiac musculoskeletal in nature Atrial fibrillation  Hyperlipidemia Lymphedema CVA [facility] Course Patient presents to the hospital complaining of pain in her right arm. She denies injuring the arm. Patient also had a elevated troponin and was noted to have acute kidney injury. Patient was admitted for IV fluids and serial cardiac enzymes were done. For her arm pain she was treated with prednisone with resolution of the pain. She was seen by cardiology who felt that her right arm pain was noncardiac. They recommended outpatient follow-up. At this time she is doing much better and denies any symptoms and is stable for discharge              Consults  cardiology  Significant Tests:  See full reports for all details    Dg Chest 1 View  05/05/2015   CLINICAL DATA:  Right arm shoulder and neck pain.  Chest pain.  EXAM: CHEST  1 VIEW  COMPARISON:  One-view chest x-ray 04/19/2014  FINDINGS: The heart is mildly enlarged. Mild pulmonary vascular congestion is evident without frank edema. A granuloma is again noted at the right base. Atherosclerotic calcifications are present in the aortic arch.  IMPRESSION: 1. Cardiomegaly and mild pulmonary vascular congestion, similar to the prior exam. 2. No other acute cardiopulmonary disease.   Electronically Signed   By: Marin Roberts M.D.   On: 05/05/2015 21:11       Today   Subjective:   Colleen Carson  feels okay denies any chest pain or shortness of breath  Objective:   Blood pressure 132/71, pulse 63, temperature 97.6 F (36.4 C), temperature source Oral, resp. rate 16, height   (1.549 m), weight 69.491 kg (153 lb 3.2 oz), SpO2 97 %.  .  Intake/Output Summary (Last 24 hours) at 05/07/15 1455 Last data filed at 05/07/15 0941  Gross per 24 hour  Intake 1608.33 ml  Output    700 ml  Net 908.33 ml    Exam VITAL SIGNS: Blood pressure 132/71, pulse 63, temperature 97.6 F (36.4 C), temperature source Oral, resp. rate 16, height  (1.549 m), weight 69.491 kg (153 lb 3.2 oz), SpO2 97 %.  GENERAL:  81 y.o.-year-old patient lying in the bed with no acute distress.  EYES: Pupils equal, round, reactive to light and accommodation. No scleral icterus. Extraocular muscles intact.  HEENT: Head atraumatic, normocephalic. Oropharynx and nasopharynx clear.  NECK:  Supple, no jugular venous distention. No thyroid enlargement, no tenderness.  LUNGS: Normal breath sounds bilaterally, no wheezing, rales,rhonchi or crepitation. No use of accessory muscles of respiration.  CARDIOVASCULAR: S1, S2 normal. No murmurs, rubs, or gallops.  ABDOMEN: Soft, nontender, nondistended. Bowel sounds present. No organomegaly or mass.  EXTREMITIES: No pedal edema, cyanosis, or clubbing.  NEUROLOGIC: Cranial nerves II through XII are intact. Muscle strength 5/5 in all extremities. Sensation intact. Gait not checked.  PSYCHIATRIC: The patient is alert and oriented x 3.  SKIN: No obvious rash, lesion, or ulcer.   Data Review     CBC w Diff: Lab Results  Component Value Date   WBC 8.5 05/05/2015   WBC 7.9 01/10/2015   HGB 12.7 05/05/2015   HGB  12.8 01/10/2015   HCT 38.6 05/05/2015   HCT 39.8 01/10/2015   PLT 175 05/05/2015   PLT 230 01/10/2015   LYMPHOPCT 29 05/05/2015   LYMPHOPCT 22.0 09/30/2014   MONOPCT 8 05/05/2015   MONOPCT 10.6 09/30/2014   EOSPCT 2 05/05/2015   EOSPCT 1.2 09/30/2014   BASOPCT 1 05/05/2015   BASOPCT 0.5 09/30/2014   CMP: Lab Results  Component Value Date   NA 140 05/07/2015   NA 138 01/10/2015   K 4.4 05/07/2015   K 4.1 01/10/2015   CL 106 05/07/2015    CL 98* 01/10/2015   CO2 25 05/07/2015   CO2 29 01/10/2015   BUN 24* 05/07/2015   BUN 30* 01/10/2015   CREATININE 0.87 05/07/2015   CREATININE 1.25* 01/10/2015   PROT 6.9 05/05/2015   ALBUMIN 4.0 05/05/2015   BILITOT 0.5 05/05/2015   ALKPHOS 199* 05/05/2015   AST 37 05/05/2015   ALT 33 05/05/2015  .  Micro Results No results found for this or any previous visit (from the past 240 hour(s)).      Code Status Orders        Start     Ordered   05/06/15 0302  Do not attempt resuscitation (DNR)   Continuous    Question Answer Comment  In the event of cardiac or respiratory ARREST Do not call a "code blue"   In the event of cardiac or respiratory ARREST Do not perform Intubation, CPR, defibrillation or ACLS   In the event of cardiac or respiratory ARREST Use medication by any route, position, wound care, and other measures to relive pain and suffering. May use oxygen, suction and manual treatment of airway obstruction as needed for comfort.      05/06/15 0301    Advance Directive Documentation        Most Recent Value   Type of Advance Directive  Living will   Pre-existing out of facility DNR order (yellow form or pink MOST form)     "MOST" Form in Place?            Follow-up Information    Follow up with MENZ,MICHAEL, MD. Schedule an appointment as soon as possible for a visit in 3 days.   Specialty:  Orthopedic Surgery   Why:  Wednesday, August 24th at 330pm, ccs   Contact information:   [address] [place] [address] [phone]       Follow up with [name], Lavada Mesi, MD In 7 days.   Specialty:  Family Medicine   Why:  Monday, August 22nd at 230pm, ccs   Contact information:   [address] [place] [address] [phone]       Discharge Medications     Medication List    TAKE these medications        aspirin 81 MG EC tablet  Take 1 tablet (81 mg total) by mouth daily.     CENTRUM SILVER PO  Take 1 tablet by mouth daily.      ferrous fumarate-b12-vitamic C-folic acid capsule  Commonly known as:  TRINSICON / FOLTRIN  Take 1 capsule by mouth 2 (two) times daily after a meal.     fluticasone 50 MCG/ACT nasal spray  Commonly known as:  FLONASE  Place 2 sprays into both nostrils daily as needed for rhinitis.     hydrochlorothiazide 12.5 MG tablet  Commonly known as:  HYDRODIURIL  Take 1 tablet by mouth daily.     HYDROcodone-acetaminophen 5-325 MG  per tablet  Commonly known as:  NORCO/VICODIN  Take 1-2 tablets by mouth every 6 (six) hours as needed for moderate pain.     losartan 50 MG tablet  Commonly known as:  COZAAR  Take 1.5 tablets (75 mg total) by mouth daily.     ondansetron 8 MG disintegrating tablet  Commonly known as:  ZOFRAN-ODT  Take 1 tablet (8 mg total) by mouth every 8 (eight) hours as needed for nausea or vomiting (Try 4 mg initially, and increase to 8 mg if no improvement).     senna-docusate 8.6-50 MG per tablet  Commonly known as:  Senokot-S  Take 1 tablet by mouth at bedtime as needed for mild constipation.     simvastatin 20 MG tablet  Commonly known as:  ZOCOR  Take 1 tablet by mouth every evening.     triamcinolone cream 0.5 %  Commonly known as:  KENALOG  Apply 1 application topically 2 (two) times daily.     warfarin 2 MG tablet  Commonly known as:  COUMADIN  Take 2 tablets by mouth daily.           Total Time in preparing paper work, data evaluation and todays exam - 35 minutes  Auburn Bilberry M.D on 05/07/2015 at 2:55 PM  [facility]   Office  [phone]

## 2015-05-07 NOTE — Care Management (Signed)
Patient admitted from home with AKI.  Patient lives at home with daughter.  Daughter at bedside during assessment.  Patient states that she uses Psychologist, forensic on [address].  Patient states that she was a walker and a cane in the home, and uses the walker at times post surgery in May.  Patient and daughter express no home needs at the time of discharge.  RN CM signing off

## 2015-09-22 ENCOUNTER — Emergency Department
Admission: EM | Admit: 2015-09-22 | Discharge: 2015-09-22 | Disposition: A | Discharge status: Home / Self Care | Payer: Medicare Other | Attending: Emergency Medicine | Admitting: Emergency Medicine

## 2015-09-22 ENCOUNTER — Encounter: Payer: Self-pay | Admitting: Emergency Medicine

## 2015-09-22 DIAGNOSIS — Z7982 Long term (current) use of aspirin: Secondary | ICD-10-CM | POA: Insufficient documentation

## 2015-09-22 DIAGNOSIS — I10 Essential (primary) hypertension: Secondary | ICD-10-CM | POA: Insufficient documentation

## 2015-09-22 DIAGNOSIS — Z79899 Other long term (current) drug therapy: Secondary | ICD-10-CM | POA: Diagnosis not present

## 2015-09-22 DIAGNOSIS — I480 Paroxysmal atrial fibrillation: Secondary | ICD-10-CM | POA: Diagnosis not present

## 2015-09-22 DIAGNOSIS — R002 Palpitations: Secondary | ICD-10-CM | POA: Diagnosis present

## 2015-09-22 DIAGNOSIS — I890 Lymphedema, not elsewhere classified: Secondary | ICD-10-CM | POA: Insufficient documentation

## 2015-09-22 DIAGNOSIS — R197 Diarrhea, unspecified: Secondary | ICD-10-CM | POA: Diagnosis not present

## 2015-09-22 DIAGNOSIS — Z7901 Long term (current) use of anticoagulants: Secondary | ICD-10-CM | POA: Insufficient documentation

## 2015-09-22 HISTORY — DX: Unspecified atrial fibrillation: I48.91

## 2015-09-22 LAB — BASIC METABOLIC PANEL
ANION GAP: 9 (ref 5–15)
BUN: 30 mg/dL — ABNORMAL HIGH (ref 6–20)
CHLORIDE: 101 mmol/L (ref 101–111)
CO2: 30 mmol/L (ref 22–32)
Calcium: 9.5 mg/dL (ref 8.9–10.3)
Creatinine, Ser: 0.92 mg/dL (ref 0.44–1.00)
GFR calc non Af Amer: 54 mL/min — ABNORMAL LOW (ref >60)
GLUCOSE: 229 mg/dL — AB (ref 65–99)
Potassium: 3.5 mmol/L (ref 3.5–5.1)
Sodium: 140 mmol/L (ref 135–145)

## 2015-09-22 LAB — CBC
HEMATOCRIT: 42.2 % (ref 35.0–47.0)
HEMOGLOBIN: 14 g/dL (ref 12.0–16.0)
MCH: 29.6 pg (ref 26.0–34.0)
MCHC: 33.1 g/dL (ref 32.0–36.0)
MCV: 89.5 fL (ref 80.0–100.0)
Platelets: 195 10*3/uL (ref 150–440)
RBC: 4.71 MIL/uL (ref 3.80–5.20)
RDW: 14.7 % — ABNORMAL HIGH (ref 11.5–14.5)
WBC: 6.1 10*3/uL (ref 3.6–11.0)

## 2015-09-22 LAB — PROTIME-INR
INR: 2.43
PROTHROMBIN TIME: 26.1 s — AB (ref 11.4–15.0)

## 2015-09-22 LAB — TROPONIN I

## 2015-09-22 MED ORDER — METOPROLOL TARTRATE 25 MG PO TABS
25.0000 mg | ORAL_TABLET | Freq: Once | ORAL | Status: AC
  Administered 2015-09-22: 25 mg via ORAL
  Filled 2015-09-22: qty 1

## 2015-09-22 MED ORDER — METOPROLOL TARTRATE 25 MG PO TABS: ORAL_TABLET | ORAL | Status: DC

## 2015-09-22 NOTE — ED Notes (Signed)
States sensation of "heart pumping hard" since 1 pm, noted BP and HR elevated at home. Denies chest pain, SOB or dizzyness.

## 2015-09-22 NOTE — ED Notes (Signed)
Pt reports feeling shaky and feels like she is havign palpitations.

## 2015-09-22 NOTE — ED Provider Notes (Signed)
[facility] Emergency Department Provider Note   ____________________________________________  Time seen: Approximately 7:40 PM I have reviewed the triage vital signs and the triage nursing note.  HISTORY  Chief Complaint Palpitations   Historian Patient, son-in-law with whom she lives  HPI Colleen Carson is a 81 y.o. female who sees Dr. Lady Gary cardiology, for history of paroxysmal A. fib, is here for evaluation of racing heart beat. Patient states she started to feel around 1 PM, but did not tell her son-in-law until this evening. Denies any chest pain, trouble breathing, shortness breath, fever. She did have one episode of diarrhea. She also reports being under somewhat stress due to her husband having medical issues related to a stroke.  She has not had an episode of A. fib for several months. She is not on any rhythm control medication. She has recently had changes in her Coumadin dosing.    Past Medical History  Diagnosis Date  . Hypertension   . Dysrhythmia     Afib  . Hypercholesteremia   . Lymphedema     lower extremities  . Stroke [facility])     noted ischemia on CT scan  . Shingles   . Arthritis   . S/P total hip arthroplasty 01/25/2015  . Atrial fibrillation [facility])     Patient Active Problem List   Diagnosis Date Noted  . AKI (acute kidney injury) (HCC) 05/06/2015  . S/P total hip arthroplasty 01/25/2015  . Primary osteoarthritis of left hip 01/23/2015    Past Surgical History  Procedure Laterality Date  . Joint replacement  01/23/15    Left THR Dr. Rosita Kea   . Total hip arthroplasty Left 01/23/2015    Procedure: TOTAL HIP ARTHROPLASTY ANTERIOR APPROACH;  Surgeon: Kennedy Bucker, MD;  Location: [facility] ORS;  Service: Orthopedics;  Laterality: Left;    Current Outpatient Rx  Name  Route  Sig  Dispense  Refill  . aspirin EC 81 MG EC tablet   Oral   Take 1 tablet (81 mg total) by mouth daily.   30 tablet   0   . ferrous fumarate-b12-vitamic  C-folic acid (TRINSICON / FOLTRIN) capsule   Oral   Take 1 capsule by mouth 2 (two) times daily after a meal. Patient not taking: Reported on 05/06/2015   30 capsule   0   . fluticasone (FLONASE) 50 MCG/ACT nasal spray   Each Nare   Place 2 sprays into both nostrils daily as needed for rhinitis.          . hydrochlorothiazide (HYDRODIURIL) 12.5 MG tablet   Oral   Take 1 tablet by mouth daily.         Marland Kitchen HYDROcodone-acetaminophen (NORCO/VICODIN) 5-325 MG per tablet   Oral   Take 1-2 tablets by mouth every 6 (six) hours as needed for moderate pain. Patient not taking: Reported on 05/06/2015   60 tablet   0   . losartan (COZAAR) 50 MG tablet   Oral   Take 1.5 tablets (75 mg total) by mouth daily.   30 tablet   0   . Multiple Vitamins-Minerals (CENTRUM SILVER PO)   Oral   Take 1 tablet by mouth daily.         . ondansetron (ZOFRAN-ODT) 8 MG disintegrating tablet   Oral   Take 1 tablet (8 mg total) by mouth every 8 (eight) hours as needed for nausea or vomiting (Try 4 mg initially, and increase to 8 mg if no improvement). Patient not taking:  Reported on 05/06/2015   20 tablet   0   . senna-docusate (SENOKOT-S) 8.6-50 MG per tablet   Oral   Take 1 tablet by mouth at bedtime as needed for mild constipation. Patient not taking: Reported on 05/06/2015   30 tablet   0   . simvastatin (ZOCOR) 20 MG tablet   Oral   Take 1 tablet by mouth every evening.         . triamcinolone cream (KENALOG) 0.5 %   Topical   Apply 1 application topically 2 (two) times daily. Patient not taking: Reported on 05/06/2015   30 g   0   . warfarin (COUMADIN) 2 MG tablet   Oral   Take 2 tablets by mouth daily.           Allergies Morphine and related  No family history on file.  Social History Social History  Substance Use Topics  . Smoking status: Never Smoker   . Smokeless tobacco: Never Used  . Alcohol Use: No    Review of Systems  Constitutional: Negative for  fever. Eyes: Negative for visual changes. ENT: Negative for sore throat. Cardiovascular: Negative for chest pain. Respiratory: Negative for shortness of breath. Gastrointestinal: One episode of diarrhea. Genitourinary: Negative for dysuria. Musculoskeletal: Negative for back pain. Skin: Negative for rash. Neurological: Negative for headache. 10 point Review of Systems otherwise negative ____________________________________________   PHYSICAL EXAM:  VITAL SIGNS: ED Triage Vitals  Enc Vitals Group     BP 09/22/15 1817 184/103 mmHg     Pulse Rate 09/22/15 1817 123     Resp 09/22/15 1817 20     Temp 09/22/15 1817 97.5 F (36.4 C)     Temp Source 09/22/15 1817 Oral     SpO2 09/22/15 1817 99 %     Weight 09/22/15 1817 139 lb (63.05 kg)     Height 09/22/15 1817  (1.549 m)     Head Cir --      Peak Flow --      Pain Score 09/22/15 1901 0     Pain Loc --      Pain Edu? --      Excl. in GC? --      Constitutional: Alert and oriented. Well appearing and in no distress. Eyes: Conjunctivae are normal. PERRL. Normal extraocular movements. ENT   Head: Normocephalic and atraumatic.   Nose: No congestion/rhinnorhea.   Mouth/Throat: Mucous membranes are moist.   Neck: No stridor. Cardiovascular/Chest: Normal rate, regular rhythm.  No murmurs, rubs, or gallops. Respiratory: Normal respiratory effort without tachypnea nor retractions. Breath sounds are clear and equal bilaterally. No wheezes/rales/rhonchi. Gastrointestinal: Soft. No distention, no guarding, no rebound. Nontender   Genitourinary/rectal:Deferred Musculoskeletal: Nontender with normal range of motion in all extremities. No joint effusions.  No lower extremity tenderness.  Mild lymphedema. Neurologic:  Normal speech and language. No gross or focal neurologic deficits are appreciated. Skin:  Skin is warm, dry and intact. No rash noted. Psychiatric: Mood and affect are normal. Speech and behavior are normal.  Patient exhibits appropriate insight and judgment.  ____________________________________________   EKG I, Governor Rooks, MD, the attending physician have personally viewed and interpreted all ECGs.  42 68-year-old flutter with 21 block. Left bundle branch block. Left axis deviation. ____________________________________________  LABS (pertinent positives/negatives)  Glucose 229, BUN 30, otherwise a O is within normal limits. White blood count 6.1, hemoglobin 14.0 and platelet count 195 INR 2.43 Troponin less than 0.03  ____________________________________________  RADIOLOGY All  Xrays were viewed by me. Imaging interpreted by Radiologist.  None __________________________________________  PROCEDURES  Procedure(s) performed: None  Critical Care performed: None  ____________________________________________   ED COURSE / ASSESSMENT AND PLAN  CONSULTATIONS: Phone consultation with cardiologist Dr. Lady GaryFath, recommends when necessary by mouth metoprolol, including 1 dose now. Follow-up in the office.  Pertinent labs & imaging results that were available during my care of the patient were reviewed by me and considered in my medical decision making (see chart for details).  Patient has a history of paroxysmal A. fib, but usually is normal sinus rhythm. She is had this episode since approximately 1 PM. After arrival here in the emergency department room, she did spontaneously convert back to normal sinus rhythm. Patient was given 1 dose of by mouth metoprolol. I discussed at length with patient and her caregiver which is her son-in-law that they wanted prescription for metoprolol to take if she has another episode of rapid A. fib, without any concerning symptoms such as dizziness, confusion, weakness, syncope, or chest pain.    Patient / Family / Caregiver informed of clinical course, medical decision-making process, and agree with plan.   I discussed return precautions, follow-up  instructions, and discharged instructions with patient and/or family.  ___________________________________________   FINAL CLINICAL IMPRESSION(S) / ED DIAGNOSES   Final diagnoses:  Paroxysmal atrial fibrillation (HCC)       Governor Rooksebecca Caz Weaver, MD 09/27/15 [phone]

## 2015-09-22 NOTE — Discharge Instructions (Signed)
You were evaluated for racing heart beat, and found to have an episode of atrial fibrillation, which spontaneously reverted to normal sinus rhythm here in the emergency department. Your exam and evaluation are reassuring tonight. You were given one dose of metoprolol to help keep the heart from racing.  We discussed, if you have an episode of heart rate over 100, you may try one tablet of metoprolol and wait 30-60 minutes to see if your heart rate results. Always come to the emergency room for evaluation if you have fast or irregular heartbeat associated with any symptoms such as dizziness or passing out, chest pain, trouble breathing or shortness of breath.   Atrial Fibrillation Atrial fibrillation is a type of heartbeat that is irregular or fast (rapid). If you have this condition, your heart keeps quivering in a weird (chaotic) way. This condition can make it so your heart cannot pump blood normally. Having this condition gives a person more risk for stroke, heart failure, and other heart problems. There are different types of atrial fibrillation. Talk with your doctor to learn about the type that you have. HOME CARE  Take over-the-counter and prescription medicines only as told by your doctor.  If your doctor prescribed a blood-thinning medicine, take it exactly as told. Taking too much of it can cause bleeding. If you do not take enough of it, you will not have the protection that you need against stroke and other problems.  Do not use any tobacco products. These include cigarettes, chewing tobacco, and e-cigarettes. If you need help quitting, ask your doctor.  If you have apnea (obstructive sleep apnea), manage it as told by your doctor.  Do not drink alcohol.  Do not drink beverages that have caffeine. These include coffee, soda, and tea.  Maintain a healthy weight. Do not use diet pills unless your doctor says they are safe for you. Diet pills may make heart problems worse.  Follow  diet instructions as told by your doctor.  Exercise regularly as told by your doctor.  Keep all follow-up visits as told by your doctor. This is important. GET HELP IF:  You notice a change in the speed, rhythm, or strength of your heartbeat.  You are taking a blood-thinning medicine and you notice more bruising.  You get tired more easily when you move or exercise. GET HELP RIGHT AWAY IF:  You have pain in your chest or your belly (abdomen).  You have sweating or weakness.  You feel sick to your stomach (nauseous).  You notice blood in your throw up (vomit), poop (stool), or pee (urine).  You are short of breath.  You suddenly have swollen feet and ankles.  You feel dizzy.  Your suddenly get weak or numb in your face, arms, or legs, especially if it happens on one side of your body.  You have trouble talking, trouble understanding, or both.  Your face or your eyelid droops on one side. These symptoms may be an emergency. Do not wait to see if the symptoms will go away. Get medical help right away. Call your local emergency services (911 in the [place] Do not drive yourself to the hospital.   This information is not intended to replace advice given to you by your health care provider. Make sure you discuss any questions you have with your health care provider.   Document Released: 06/17/2008 Document Revised: 05/30/2015 Document Reviewed: 01/03/2015 Elsevier Interactive Patient Education [employer]2016 [employer] Inc[employer]

## 2015-09-23 HISTORY — PX: PACEMAKER INSERTION: SHX728

## 2016-03-04 ENCOUNTER — Emergency Department: Payer: Medicare Other

## 2016-03-04 ENCOUNTER — Encounter: Payer: Self-pay | Admitting: Emergency Medicine

## 2016-03-04 ENCOUNTER — Emergency Department
Admission: EM | Admit: 2016-03-04 | Discharge: 2016-03-04 | Disposition: A | Discharge status: Home / Self Care | Payer: Medicare Other | Attending: Emergency Medicine | Admitting: Emergency Medicine

## 2016-03-04 DIAGNOSIS — I4891 Unspecified atrial fibrillation: Secondary | ICD-10-CM

## 2016-03-04 DIAGNOSIS — Z792 Long term (current) use of antibiotics: Secondary | ICD-10-CM | POA: Diagnosis not present

## 2016-03-04 DIAGNOSIS — Z8669 Personal history of other diseases of the nervous system and sense organs: Secondary | ICD-10-CM | POA: Diagnosis not present

## 2016-03-04 DIAGNOSIS — Z79899 Other long term (current) drug therapy: Secondary | ICD-10-CM | POA: Diagnosis not present

## 2016-03-04 DIAGNOSIS — M1990 Unspecified osteoarthritis, unspecified site: Secondary | ICD-10-CM | POA: Diagnosis not present

## 2016-03-04 DIAGNOSIS — R002 Palpitations: Secondary | ICD-10-CM | POA: Diagnosis present

## 2016-03-04 DIAGNOSIS — I10 Essential (primary) hypertension: Secondary | ICD-10-CM | POA: Insufficient documentation

## 2016-03-04 DIAGNOSIS — I480 Paroxysmal atrial fibrillation: Secondary | ICD-10-CM | POA: Diagnosis not present

## 2016-03-04 LAB — COMPREHENSIVE METABOLIC PANEL
ALBUMIN: 3.9 g/dL (ref 3.5–5.0)
ALK PHOS: 154 U/L — AB (ref 38–126)
ALT: 18 U/L (ref 14–54)
ANION GAP: 9 (ref 5–15)
AST: 28 U/L (ref 15–41)
BILIRUBIN TOTAL: 0.7 mg/dL (ref 0.3–1.2)
BUN: 25 mg/dL — AB (ref 6–20)
CALCIUM: 9.3 mg/dL (ref 8.9–10.3)
CO2: 27 mmol/L (ref 22–32)
CREATININE: 0.83 mg/dL (ref 0.44–1.00)
Chloride: 104 mmol/L (ref 101–111)
GFR calc Af Amer: >60 mL/min (ref >60)
GFR calc non Af Amer: >60 mL/min (ref >60)
GLUCOSE: 113 mg/dL — AB (ref 65–99)
Potassium: 3.8 mmol/L (ref 3.5–5.1)
SODIUM: 140 mmol/L (ref 135–145)
Total Protein: 6.4 g/dL — ABNORMAL LOW (ref 6.5–8.1)

## 2016-03-04 LAB — CBC
HEMATOCRIT: 41 % (ref 35.0–47.0)
HEMOGLOBIN: 13.7 g/dL (ref 12.0–16.0)
MCH: 29.4 pg (ref 26.0–34.0)
MCHC: 33.4 g/dL (ref 32.0–36.0)
MCV: 88.1 fL (ref 80.0–100.0)
Platelets: 166 10*3/uL (ref 150–440)
RBC: 4.66 MIL/uL (ref 3.80–5.20)
RDW: 14.2 % (ref 11.5–14.5)
WBC: 7.1 10*3/uL (ref 3.6–11.0)

## 2016-03-04 LAB — TROPONIN I

## 2016-03-04 MED ORDER — SODIUM CHLORIDE 0.9 % IV BOLUS (SEPSIS)
1000.0000 mL | Freq: Once | INTRAVENOUS | Status: AC
  Administered 2016-03-04: 1000 mL via INTRAVENOUS

## 2016-03-04 MED ORDER — METOPROLOL TARTRATE 25 MG PO TABS
25.0000 mg | ORAL_TABLET | Freq: Once | ORAL | Status: AC
  Administered 2016-03-04: 25 mg via ORAL
  Filled 2016-03-04: qty 1

## 2016-03-04 MED ORDER — SODIUM CHLORIDE 0.9 % IV BOLUS (SEPSIS): 1000.0000 mL | Freq: Once | INTRAVENOUS | Status: DC

## 2016-03-04 MED ORDER — METOPROLOL TARTRATE 5 MG/5ML IV SOLN
5.0000 mg | Freq: Once | INTRAVENOUS | Status: AC
Start: 2016-03-04 — End: 2016-03-04
  Administered 2016-03-04: 5 mg via INTRAVENOUS
  Filled 2016-03-04: qty 5

## 2016-03-04 NOTE — ED Notes (Signed)
Pt here from home via [facility] with c/o heart palpitations; reports hx of afib. Pt states she woke up at 3am and felt her heart racing, took a metoprolol. Pt reports around 0630 am she got up to use the bathroom and it was worse. Pt reports taking her eliquis this morning, denies pain at present.

## 2016-03-04 NOTE — ED Notes (Signed)
Patient ambulated in hallway. Tolerated well. Denies any pain or palpitations at this time.

## 2016-03-04 NOTE — ED Provider Notes (Signed)
[facility] Emergency Department Provider Note  ____________________________________________    I have reviewed the triage vital signs and the nursing notes.   HISTORY  Chief Complaint Palpitations    HPI Colleen Carson is a 82 y.o. female who presents with complaints of a racing heart. Patient has a history of paroxysmal atrial fibrillation, she is on eliquis. She reports she got up to use the restroom last night at approximately 1 or 2 in the morning and developed palpitations. She took one of her metoprolol  and lied back down She follows with Dr. Lady GaryFath, and takes metoprolol 25 mg every 12 hours. No chest pain, no shortness of breath.      Past Medical History  Diagnosis Date  . Hypertension   . Dysrhythmia     Afib  . Hypercholesteremia   . Lymphedema     lower extremities  . Stroke [facility](HCC)     noted ischemia on CT scan  . Shingles   . Arthritis   . S/P total hip arthroplasty 01/25/2015  . Atrial fibrillation [facility](HCC)     Patient Active Problem List   Diagnosis Date Noted  . AKI (acute kidney injury) (HCC) 05/06/2015  . S/P total hip arthroplasty 01/25/2015  . Primary osteoarthritis of left hip 01/23/2015    Past Surgical History  Procedure Laterality Date  . Joint replacement  01/23/15    Left THR Dr. Rosita KeaMenz   . Total hip arthroplasty Left 01/23/2015    Procedure: TOTAL HIP ARTHROPLASTY ANTERIOR APPROACH;  Surgeon: Kennedy BuckerMichael Menz, MD;  Location: [facility] ORS;  Service: Orthopedics;  Laterality: Left;    Current Outpatient Rx  Name  Route  Sig  Dispense  Refill  . aspirin EC 81 MG EC tablet   Oral   Take 1 tablet (81 mg total) by mouth daily.   30 tablet   0   . ferrous fumarate-b12-vitamic C-folic acid (TRINSICON / FOLTRIN) capsule   Oral   Take 1 capsule by mouth 2 (two) times daily after a meal. Patient not taking: Reported on 05/06/2015   30 capsule   0   . fluticasone (FLONASE) 50 MCG/ACT nasal spray   Each Nare   Place 2 sprays into  both nostrils daily as needed for rhinitis.          . hydrochlorothiazide (HYDRODIURIL) 12.5 MG tablet   Oral   Take 1 tablet by mouth daily.         Marland Kitchen. HYDROcodone-acetaminophen (NORCO/VICODIN) 5-325 MG per tablet   Oral   Take 1-2 tablets by mouth every 6 (six) hours as needed for moderate pain. Patient not taking: Reported on 05/06/2015   60 tablet   0   . losartan (COZAAR) 50 MG tablet   Oral   Take 1.5 tablets (75 mg total) by mouth daily.   30 tablet   0   . metoprolol tartrate (LOPRESSOR) 25 MG tablet      One tab every 12 hours prn heart rate over 110   30 tablet   0   . Multiple Vitamins-Minerals (CENTRUM SILVER PO)   Oral   Take 1 tablet by mouth daily.         . ondansetron (ZOFRAN-ODT) 8 MG disintegrating tablet   Oral   Take 1 tablet (8 mg total) by mouth every 8 (eight) hours as needed for nausea or vomiting (Try 4 mg initially, and increase to 8 mg if no improvement). Patient not taking: Reported on 05/06/2015   20  tablet   0   . senna-docusate (SENOKOT-S) 8.6-50 MG per tablet   Oral   Take 1 tablet by mouth at bedtime as needed for mild constipation. Patient not taking: Reported on 05/06/2015   30 tablet   0   . simvastatin (ZOCOR) 20 MG tablet   Oral   Take 1 tablet by mouth every evening.         . triamcinolone cream (KENALOG) 0.5 %   Topical   Apply 1 application topically 2 (two) times daily. Patient not taking: Reported on 05/06/2015   30 g   0   . warfarin (COUMADIN) 2 MG tablet   Oral   Take 2 tablets by mouth daily.           Allergies Morphine and related  No family history on file.  Social History Social History  Substance Use Topics  . Smoking status: Never Smoker   . Smokeless tobacco: Never Used  . Alcohol Use: No    Review of Systems  Constitutional: Negative for fever. Eyes: Negative for redness ENT: Negative for sore throat Cardiovascular: Negative for chest pain Respiratory: Negative for shortness  of breath. Gastrointestinal: Negative for abdominal pain Genitourinary: Negative for dysuria. Musculoskeletal: Negative for back pain. Skin: Negative for rash. Neurological: Negative for focal weakness Psychiatric: no anxiety    ____________________________________________   PHYSICAL EXAM:  VITAL SIGNS: ED Triage Vitals  Enc Vitals Group     BP 03/04/16 0726 134/94 mmHg     Pulse Rate 03/04/16 0726 127     Resp 03/04/16 0726 16     Temp 03/04/16 0726 97.7 F (36.5 C)     Temp Source 03/04/16 0726 Oral     SpO2 03/04/16 0726 100 %     Weight 03/04/16 0726 146 lb (66.225 kg)     Height 03/04/16 0726  (1.549 m)     Head Cir --      Peak Flow --      Pain Score --      Pain Loc --      Pain Edu? --      Excl. in GC? --      Constitutional: Alert and oriented. Well appearing and in no distress.  Eyes: Conjunctivae are normal. No erythema or injection ENT   Head: Normocephalic and atraumatic.   Mouth/Throat: Mucous membranes are moist. Cardiovascular: Tachycardia, Irregularly irregular rhythm. Normal and symmetric distal pulses are present in the upper extremities.  Respiratory: Normal respiratory effort without tachypnea nor retractions. Breath sounds are clear and equal bilaterally.  Gastrointestinal: Soft and non-tender in all quadrants. No distention. There is no CVA tenderness. Genitourinary: deferred Musculoskeletal: Nontender with normal range of motion in all extremities. No lower extremity tenderness nor edema. Neurologic:  Normal speech and language. No gross focal neurologic deficits are appreciated. Skin:  Skin is warm, dry and intact. No rash noted. Psychiatric: Mood and affect are normal. Patient exhibits appropriate insight and judgment.  ____________________________________________    LABS (pertinent positives/negatives)  Labs Reviewed  CBC  COMPREHENSIVE METABOLIC PANEL  TROPONIN I     ____________________________________________   EKG  ED ECG REPORT I, Jene Every, the attending physician, personally viewed and interpreted this ECG.   Date: 03/04/2016  EKG Time: 7:25 AM  Rate: 122  Rhythm: atrial fibrillation, rate 122  Axis: Left axis  Intervals:left bundle branch block  ST&T Change: Nonspecific  #2  ED ECG REPORT I, Jene Every, the attending physician, personally viewed and interpreted  this ECG.  Date: 03/04/2016 EKG Time: 1:53 PM Rate: 59 Rhythm: normal sinus rhythm QRS Axis: normal Intervals: normal ST/T Wave abnormalities: normal Conduction Disturbances: Left bundle branch block Narrative Interpretation: unremarkable    ____________________________________________    RADIOLOGY  X-ray normal  ____________________________________________   PROCEDURES  Procedure(s) performed: none  Critical Care performed: yes  CRITICAL CARE Performed by: Jene Every   Total critical care time: 30 minutes  Critical care time was exclusive of separately billable procedures and treating other patients.  Critical care was necessary to treat or prevent imminent or life-threatening deterioration.  Critical care was time spent personally by me on the following activities: development of treatment plan with patient and/or surrogate as well as nursing, discussions with consultants, evaluation of patient's response to treatment, examination of patient, obtaining history from patient or surrogate, ordering and performing treatments and interventions, ordering and review of laboratory studies, ordering and review of radiographic studies, pulse oximetry and re-evaluation of patient's condition.   ____________________________________________   INITIAL IMPRESSION / ASSESSMENT AND PLAN / ED COURSE  Pertinent labs & imaging results that were available during my care of the patient were reviewed by me and considered in my medical decision making  (see chart for details).     ----------------------------------------- 7:55 AM on 03/04/2016 -----------------------------------------  Patient complains of feeling dizzy and warm. Her heart rate has crept up into the 130s, her blood pressure is 140/70, we will give 5 of IV metoprolol to see if this improves her symptoms, she is already receiving normal saline.  ----------------------------------------- 8:55 AM on 03/04/2016 -----------------------------------------  Patient reports her symptoms improved after IV metoprolol. She feels well currently. Discussed with Dr. Gwen Pounds of cardiology, he recommends 25 of by mouth metoprolol and if she feels better likely discharge  ----------------------------------------- 1:53 PM on 03/04/2016 -----------------------------------------  Second troponin is normal. Patient just converted to normal sinus rhythm. She feels well. He ambulated without difficulty. Okay for discharge with follow-up with Dr. Lady Gary  ____________________________________________   FINAL CLINICAL IMPRESSION(S) / ED DIAGNOSES  Final diagnoses:  Atrial fibrillation with rapid ventricular response (HCC)          Jene Every, MD 03/04/16 1358

## 2016-03-04 NOTE — Discharge Instructions (Signed)

## 2016-03-11 ENCOUNTER — Inpatient Hospital Stay
Admission: EM | Admit: 2016-03-11 | Discharge: 2016-03-13 | DRG: 310 | Disposition: A | Discharge status: Home / Self Care | Payer: Medicare Other | Attending: Specialist | Admitting: Specialist

## 2016-03-11 ENCOUNTER — Emergency Department: Payer: Medicare Other

## 2016-03-11 ENCOUNTER — Encounter: Payer: Self-pay | Admitting: Emergency Medicine

## 2016-03-11 ENCOUNTER — Other Ambulatory Visit: Payer: Self-pay

## 2016-03-11 DIAGNOSIS — I480 Paroxysmal atrial fibrillation: Secondary | ICD-10-CM | POA: Diagnosis present

## 2016-03-11 DIAGNOSIS — Z79899 Other long term (current) drug therapy: Secondary | ICD-10-CM

## 2016-03-11 DIAGNOSIS — E0590 Thyrotoxicosis, unspecified without thyrotoxic crisis or storm: Secondary | ICD-10-CM | POA: Diagnosis present

## 2016-03-11 DIAGNOSIS — E7800 Pure hypercholesterolemia, unspecified: Secondary | ICD-10-CM | POA: Diagnosis present

## 2016-03-11 DIAGNOSIS — M1990 Unspecified osteoarthritis, unspecified site: Secondary | ICD-10-CM | POA: Diagnosis present

## 2016-03-11 DIAGNOSIS — Z8673 Personal history of transient ischemic attack (TIA), and cerebral infarction without residual deficits: Secondary | ICD-10-CM | POA: Diagnosis not present

## 2016-03-11 DIAGNOSIS — Z96642 Presence of left artificial hip joint: Secondary | ICD-10-CM | POA: Diagnosis present

## 2016-03-11 DIAGNOSIS — E785 Hyperlipidemia, unspecified: Secondary | ICD-10-CM | POA: Diagnosis present

## 2016-03-11 DIAGNOSIS — I4891 Unspecified atrial fibrillation: Secondary | ICD-10-CM | POA: Diagnosis present

## 2016-03-11 DIAGNOSIS — I10 Essential (primary) hypertension: Secondary | ICD-10-CM | POA: Diagnosis present

## 2016-03-11 DIAGNOSIS — Z7901 Long term (current) use of anticoagulants: Secondary | ICD-10-CM | POA: Diagnosis not present

## 2016-03-11 DIAGNOSIS — Z8249 Family history of ischemic heart disease and other diseases of the circulatory system: Secondary | ICD-10-CM

## 2016-03-11 DIAGNOSIS — R531 Weakness: Secondary | ICD-10-CM

## 2016-03-11 LAB — BASIC METABOLIC PANEL
Anion gap: 8 (ref 5–15)
BUN: 24 mg/dL — AB (ref 6–20)
CO2: 29 mmol/L (ref 22–32)
CREATININE: 1.12 mg/dL — AB (ref 0.44–1.00)
Calcium: 9.7 mg/dL (ref 8.9–10.3)
Chloride: 104 mmol/L (ref 101–111)
GFR calc Af Amer: 49 mL/min — ABNORMAL LOW (ref >60)
GFR, EST NON AFRICAN AMERICAN: 42 mL/min — AB (ref >60)
Glucose, Bld: 129 mg/dL — ABNORMAL HIGH (ref 65–99)
POTASSIUM: 3.8 mmol/L (ref 3.5–5.1)
SODIUM: 141 mmol/L (ref 135–145)

## 2016-03-11 LAB — CBC
HEMATOCRIT: 42.5 % (ref 35.0–47.0)
Hemoglobin: 14.3 g/dL (ref 12.0–16.0)
MCH: 29.7 pg (ref 26.0–34.0)
MCHC: 33.6 g/dL (ref 32.0–36.0)
MCV: 88.6 fL (ref 80.0–100.0)
PLATELETS: 183 10*3/uL (ref 150–440)
RBC: 4.79 MIL/uL (ref 3.80–5.20)
RDW: 14.4 % (ref 11.5–14.5)
WBC: 8.6 10*3/uL (ref 3.6–11.0)

## 2016-03-11 LAB — MAGNESIUM: MAGNESIUM: 2 mg/dL (ref 1.7–2.4)

## 2016-03-11 LAB — T4, FREE: Free T4: 1.79 ng/dL — ABNORMAL HIGH (ref 0.61–1.12)

## 2016-03-11 LAB — TROPONIN I

## 2016-03-11 LAB — TSH: TSH: 0.05 u[IU]/mL — ABNORMAL LOW (ref 0.350–4.500)

## 2016-03-11 LAB — BRAIN NATRIURETIC PEPTIDE: B NATRIURETIC PEPTIDE 5: 365 pg/mL — AB (ref 0.0–100.0)

## 2016-03-11 MED ORDER — ACETAMINOPHEN 325 MG PO TABS
650.0000 mg | ORAL_TABLET | Freq: Four times a day (QID) | ORAL | Status: DC | PRN
  Administered 2016-03-11 – 2016-03-12 (×3): 650 mg via ORAL
  Filled 2016-03-11 (×3): qty 2

## 2016-03-11 MED ORDER — FLUTICASONE PROPIONATE 50 MCG/ACT NA SUSP
2.0000 | Freq: Every day | NASAL | Status: DC | PRN
  Filled 2016-03-11: qty 16

## 2016-03-11 MED ORDER — ACETAMINOPHEN 650 MG RE SUPP: 650.0000 mg | Freq: Four times a day (QID) | RECTAL | Status: DC | PRN

## 2016-03-11 MED ORDER — DILTIAZEM HCL 25 MG/5ML IV SOLN
INTRAVENOUS | Status: AC
  Filled 2016-03-11: qty 5

## 2016-03-11 MED ORDER — ONDANSETRON HCL 4 MG/2ML IJ SOLN: 4.0000 mg | Freq: Four times a day (QID) | INTRAMUSCULAR | Status: DC | PRN

## 2016-03-11 MED ORDER — SIMVASTATIN 20 MG PO TABS
20.0000 mg | ORAL_TABLET | Freq: Every evening | ORAL | Status: DC
  Administered 2016-03-11 – 2016-03-12 (×2): 20 mg via ORAL
  Filled 2016-03-11 (×2): qty 1

## 2016-03-11 MED ORDER — ADULT MULTIVITAMIN W/MINERALS CH
1.0000 | ORAL_TABLET | Freq: Every day | ORAL | Status: DC
  Administered 2016-03-11 – 2016-03-13 (×3): 1 via ORAL
  Filled 2016-03-11 (×3): qty 1

## 2016-03-11 MED ORDER — METOPROLOL TARTRATE 25 MG PO TABS
37.5000 mg | ORAL_TABLET | Freq: Two times a day (BID) | ORAL | Status: DC
  Administered 2016-03-11: 37.5 mg via ORAL
  Filled 2016-03-11: qty 2

## 2016-03-11 MED ORDER — DILTIAZEM HCL 100 MG IV SOLR
5.0000 mg/h | Freq: Once | INTRAVENOUS | Status: AC
  Administered 2016-03-11: 5 mg/h via INTRAVENOUS
  Filled 2016-03-11: qty 100

## 2016-03-11 MED ORDER — SODIUM CHLORIDE 0.9% FLUSH
3.0000 mL | Freq: Two times a day (BID) | INTRAVENOUS | Status: DC
  Administered 2016-03-11 – 2016-03-13 (×4): 3 mL via INTRAVENOUS

## 2016-03-11 MED ORDER — APIXABAN 5 MG PO TABS
5.0000 mg | ORAL_TABLET | Freq: Two times a day (BID) | ORAL | Status: DC
  Administered 2016-03-11 – 2016-03-13 (×5): 5 mg via ORAL
  Filled 2016-03-11 (×5): qty 1

## 2016-03-11 MED ORDER — ONDANSETRON HCL 4 MG PO TABS: 4.0000 mg | ORAL_TABLET | Freq: Four times a day (QID) | ORAL | Status: DC | PRN

## 2016-03-11 MED ORDER — METOPROLOL TARTRATE 5 MG/5ML IV SOLN
5.0000 mg | Freq: Once | INTRAVENOUS | Status: AC
  Administered 2016-03-11: 5 mg via INTRAVENOUS
  Filled 2016-03-11: qty 5

## 2016-03-11 MED ORDER — DEXTROSE 5 % IV SOLN
5.0000 mg/h | INTRAVENOUS | Status: DC
  Administered 2016-03-11: 5 mg/h via INTRAVENOUS
  Filled 2016-03-11: qty 100

## 2016-03-11 MED ORDER — DILTIAZEM HCL 25 MG/5ML IV SOLN
10.0000 mg | Freq: Once | INTRAVENOUS | Status: AC
  Administered 2016-03-11: 10 mg via INTRAVENOUS

## 2016-03-11 NOTE — ED Provider Notes (Signed)
Time Seen: Approximately 0 625 I have reviewed the triage notes  Chief Complaint: Atrial Fibrillation   History of Present Illness: Colleen Carson is a 82 y.o. female who has a history of atrial fibrillation and states sudden onset of feeling that her heart was racing this morning. She is currently taking 37.5 mg of metipranolol twice a day. Patient states that she took her normal morning dosage early today. She was just recently increased to this amount per her cardiologist. Patient is on L Oquist and denies any complications such as rectal bleeding etc. Patient denies any chest pain. She describes generalized weakness.   Past Medical History  Diagnosis Date  . Hypertension   . Dysrhythmia     Afib  . Hypercholesteremia   . Lymphedema     lower extremities  . Stroke [facility](HCC)     noted ischemia on CT scan  . Shingles   . Arthritis   . S/P total hip arthroplasty 01/25/2015  . Atrial fibrillation [facility](HCC)     Patient Active Problem List   Diagnosis Date Noted  . AKI (acute kidney injury) (HCC) 05/06/2015  . S/P total hip arthroplasty 01/25/2015  . Primary osteoarthritis of left hip 01/23/2015    Past Surgical History  Procedure Laterality Date  . Joint replacement  01/23/15    Left THR Dr. Rosita KeaMenz   . Total hip arthroplasty Left 01/23/2015    Procedure: TOTAL HIP ARTHROPLASTY ANTERIOR APPROACH;  Surgeon: Kennedy BuckerMichael Menz, MD;  Location: [facility] ORS;  Service: Orthopedics;  Laterality: Left;    Past Surgical History  Procedure Laterality Date  . Joint replacement  01/23/15    Left THR Dr. Rosita KeaMenz   . Total hip arthroplasty Left 01/23/2015    Procedure: TOTAL HIP ARTHROPLASTY ANTERIOR APPROACH;  Surgeon: Kennedy BuckerMichael Menz, MD;  Location: [facility] ORS;  Service: Orthopedics;  Laterality: Left;    Current Outpatient Rx  Name  Route  Sig  Dispense  Refill  . apixaban (ELIQUIS) 5 MG TABS tablet   Oral   Take 5 mg by mouth 2 (two) times daily.         . cephALEXin (KEFLEX) 500 MG capsule   Oral   Take  2,000 mg by mouth once. 1 hour prior to dental appointment.         . fluticasone (FLONASE) 50 MCG/ACT nasal spray   Each Nare   Place 2 sprays into both nostrils daily as needed for rhinitis.          . hydrochlorothiazide (HYDRODIURIL) 12.5 MG tablet   Oral   Take 1 tablet by mouth daily.         . metoprolol tartrate (LOPRESSOR) 25 MG tablet      One tab every 12 hours prn heart rate over 110   30 tablet   0   . metoprolol tartrate (LOPRESSOR) 25 MG tablet   Oral   Take 25 mg by mouth 2 (two) times daily.         . Multiple Vitamins-Minerals (CENTRUM SILVER PO)   Oral   Take 1 tablet by mouth daily.         . simvastatin (ZOCOR) 20 MG tablet   Oral   Take 1 tablet by mouth every evening.           Allergies:  Morphine and related  Family History: History reviewed. No pertinent family history.  Social History: Social History  Substance Use Topics  . Smoking status: Never Smoker   .  Smokeless tobacco: Never Used  . Alcohol Use: No     Review of Systems:   10 point review of systems was performed and was otherwise negative:  Constitutional: No fever Eyes: No visual disturbances ENT: No sore throat, ear pain Cardiac: No chest pain Respiratory: No shortness of breath, wheezing, or stridor Abdomen: No abdominal pain, no vomiting, No diarrhea Endocrine: No weight loss, No night sweats Extremities: No peripheral edema, cyanosis Skin: No rashes, easy bruising Neurologic: No focal weakness, trouble with speech or swollowing Urologic: No dysuria, Hematuria, or urinary frequency   Physical Exam:  ED Triage Vitals  Enc Vitals Group     BP 03/11/16 0618 137/122 mmHg     Pulse Rate 03/11/16 0618 68     Resp 03/11/16 0618 18     Temp 03/11/16 0618 97.5 F (36.4 C)     Temp Source 03/11/16 0618 Oral     SpO2 03/11/16 0618 97 %     Weight 03/11/16 0618 147 lb (66.679 kg)     Height 03/11/16 0618  (1.575 m)     Head Cir --      Peak Flow --       Pain Score 03/11/16 0619 0     Pain Loc --      Pain Edu? --      Excl. in GC? --     General: Awake , Alert , and Oriented times 3; GCS 15 Head: Normal cephalic , atraumatic Eyes: Pupils equal , round, reactive to light Nose/Throat: No nasal drainage, patent upper airway without erythema or exudate.  Neck: Supple, Full range of motion, No anterior adenopathy or palpable thyroid masses Lungs: Clear to ascultation without wheezes , rhonchi, or rales Heart: Irregular rate, irregular rhythm without murmurs , gallops , or rubs Abdomen: Soft, non tender without rebound, guarding , or rigidity; bowel sounds positive and symmetric in all 4 quadrants. No organomegaly .        Extremities: 2 plus symmetric pulses. No edema, clubbing or cyanosis Neurologic: normal ambulation, Motor symmetric without deficits, sensory intact Skin: warm, dry, no rashes   Labs:   All laboratory work was reviewed including any pertinent negatives or positives listed below:  Labs Reviewed  BASIC METABOLIC PANEL  CBC  TROPONIN I  MAGNESIUM  BRAIN NATRIURETIC PEPTIDE    EKG:   ED ECG REPORT I, Jennye Moccasin, the attending physician, personally viewed and interpreted this ECG.  Date: 03/11/2016 EKG Time: 0635 Rate: *125 Rhythm: Atrial fibrillation with a rapid ventricular rate QRS Axis: normal Intervals: Left bundle branch block ST/T Wave abnormalities: normal Conduction Disturbances: none Narrative Interpretation: unremarkable   Radiology: *Pending at time of dictation I personally reviewed the radiologic studies     ED Course: * Patient's stay here last fall was uneventful she appears to have non-complicating atrial fibrillation with a rapid ventricular rate. The patient will be given IV metipranolol to start since this is a dose of her normal regular medications. Is laboratory work is pending at this time and will likely require further observation here in emergency department. She was  placed on a continuous cardiac monitor, serial blood pressures, etc.    Assessment: Paroxysmal atrial fibrillation     Plan:  Patient will be monitored by my colleague later this morning to follow laboratory work and progression. She may require further dosing of metoprolol and likely consultation with her cardiologist           Jennye Moccasin,  MD 03/11/16 1610

## 2016-03-11 NOTE — ED Provider Notes (Addendum)
Progress note  8:32 AM Pt's heart rate continued to be in the 120s to 130s despite an additional dose of metoprolol by patient and 5 of Lopressor IV. When patient got up to walk her heart rate went into the 160s. It was decided at that time to give the patient IV Cardizem bolus and start her on IV Cardizem drip. Dr. Clint GuyHower, the hospitalist, was consulted for admission.  CRITICAL CARE Performed by: Leona Carryaylor,  Sharae Zappulla M   Total critical care time: 35 minutes  Critical care time was exclusive of separately billable procedures and treating other patients.  Critical care was necessary to treat or prevent imminent or life-threatening deterioration.  Critical care was time spent personally by me on the following activities: development of treatment plan with patient and/or surrogate as well as nursing, discussions with consultants, evaluation of patient's response to treatment, examination of patient, obtaining history from patient or surrogate, ordering and performing treatments and interventions, ordering and review of laboratory studies, ordering and review of radiographic studies, pulse oximetry and re-evaluation of patient's condition.   Leona CarryLinda M Elisabetta Mishra, MD 03/11/16 [id number]  Leona CarryLinda M Jeiden Daughtridge, MD 03/11/16 [phone]

## 2016-03-11 NOTE — H&P (Signed)
[facility] [place] [facility]   PATIENT NAME: Colleen Carson    MR#:  [id number]  DATE OF BIRTH:  [birth year][facility]   DATE OF ADMISSION:  03/11/2016  PRIMARY CARE PHYSICIAN: [name], Lavada MesiMARC E, MD   REQUESTING/REFERRING PHYSICIAN: taylor  CHIEF COMPLAINT:   Chief Complaint  Patient presents with  . Atrial Fibrillation    HISTORY OF PRESENT ILLNESS:  Colleen Carson  is a 82 y.o. female with a known history of chronic atrial fibrillation presenting with palpations. She awoke around 3am with dizziness - lightheadedness, followed by palpitations. Denies chest pain. She took extra dose of her metoprolol this morning without resolution of symptoms thus presented to Hospital further workup and evaluation. On arrival noted to be in atrial fibrillation rapid ventricular response heart rate up to the 160s at maximum she received extra dose of IV beta blocker without change and bolused with Cardizem bolus and Cardizem drip. Symptoms have improved  PAST MEDICAL HISTORY:   Past Medical History  Diagnosis Date  . Hypertension   . Dysrhythmia     Afib  . Hypercholesteremia   . Lymphedema     lower extremities  . Stroke [facility](HCC)     noted ischemia on CT scan  . Shingles   . Arthritis   . S/P total hip arthroplasty 01/25/2015  . Atrial fibrillation (HCC)     PAST SURGICAL HISTORY:   Past Surgical History  Procedure Laterality Date  . Joint replacement  01/23/15    Left THR Dr. Rosita KeaMenz   . Total hip arthroplasty Left 01/23/2015    Procedure: TOTAL HIP ARTHROPLASTY ANTERIOR APPROACH;  Surgeon: Kennedy BuckerMichael Menz, MD;  Location: [facility] ORS;  Service: Orthopedics;  Laterality: Left;    SOCIAL HISTORY:   Social History  Substance Use Topics  . Smoking status: Never Smoker   . Smokeless tobacco: Never Used  . Alcohol Use: No    FAMILY HISTORY:   Family History  Problem Relation Age of Onset  . Hypertension Other     DRUG ALLERGIES:   Allergies  Allergen Reactions  . Morphine  And Related Nausea And Vomiting    REVIEW OF SYSTEMS:  REVIEW OF SYSTEMS:  CONSTITUTIONAL: Denies fevers, chills, fatigue, weakness.  EYES: Denies blurred vision, double vision, or eye pain.  EARS, NOSE, THROAT: Denies tinnitus, ear pain, hearing loss.  RESPIRATORY: denies cough, shortness of breath, wheezing  CARDIOVASCULAR: Denies chest pain, Positive palpitations, denies edema.  GASTROINTESTINAL: Denies nausea, vomiting, diarrhea, abdominal pain.  GENITOURINARY: Denies dysuria, hematuria.  ENDOCRINE: Denies nocturia or thyroid problems. HEMATOLOGIC AND LYMPHATIC: Denies easy bruising or bleeding.  SKIN: Denies rash or lesions.  MUSCULOSKELETAL: Denies pain in neck, back, shoulder, knees, hips, or further arthritic symptoms.  NEUROLOGIC: Denies paralysis, paresthesias.  PSYCHIATRIC: Denies anxiety or depressive symptoms. Otherwise full review of systems performed by me is negative.   MEDICATIONS AT HOME:   Prior to Admission medications   Medication Sig Start Date End Date Taking? Authorizing Provider  apixaban (ELIQUIS) 5 MG TABS tablet Take 5 mg by mouth 2 (two) times daily.   Yes Historical Provider, MD  cephALEXin (KEFLEX) 500 MG capsule Take 2,000 mg by mouth daily as needed (1 hour prior to dental appointment).    Yes Historical Provider, MD  fluticasone (FLONASE) 50 MCG/ACT nasal spray Place 2 sprays into both nostrils daily as needed for rhinitis.    Yes Historical Provider, MD  hydrochlorothiazide (HYDRODIURIL) 12.5 MG tablet Take 12.5 mg by mouth daily.  Yes Historical Provider, MD  metoprolol tartrate (LOPRESSOR) 25 MG tablet One tab every 12 hours prn heart rate over 110 Patient taking differently: Take 25 mg by mouth every 12 (twelve) hours as needed (for heart rate over 110).  09/22/15 09/21/16 Yes Governor Rooks, MD  metoprolol tartrate (LOPRESSOR) 25 MG tablet Take 37.5 mg by mouth 2 (two) times daily.    Yes Historical Provider, MD  Multiple Vitamins-Minerals  (CENTRUM SILVER PO) Take 1 tablet by mouth daily.   Yes Historical Provider, MD  simvastatin (ZOCOR) 20 MG tablet Take 20 mg by mouth every evening.    Yes Historical Provider, MD      VITAL SIGNS:  Blood pressure 126/99, pulse 113, temperature 97.5 F (36.4 C), temperature source Oral, resp. rate 23, height  (1.575 m), weight 147 lb (66.679 kg), SpO2 95 %.  PHYSICAL EXAMINATION:  VITAL SIGNS: Filed Vitals:   03/11/16 0830 03/11/16 0845  BP: 126/99   Pulse: 113 113  Temp:    Resp: 15 23   GENERAL:82 y.o.female currently in no acute distress.  HEAD: Normocephalic, atraumatic.  EYES: Pupils equal, round, reactive to light. Extraocular muscles intact. No scleral icterus.  MOUTH: Moist mucosal membrane. Dentition intact. No abscess noted.  EAR, NOSE, THROAT: Clear without exudates. No external lesions.  NECK: Supple. No thyromegaly. No nodules. No JVD.  PULMONARY: Clear to ascultation, without wheeze rails or rhonci. No use of accessory muscles, Good respiratory effort. good air entry bilaterally CHEST: Nontender to palpation.  CARDIOVASCULAR: S1 and S2. irregular rate and rhythm. No murmurs, rubs, or gallops. No pitting edema chronic lymphedema . Pedal pulses 2+ bilaterally.  GASTROINTESTINAL: Soft, nontender, nondistended. No masses. Positive bowel sounds. No hepatosplenomegaly.  MUSCULOSKELETAL: No swelling, clubbing, or edema. Range of motion full in all extremities.  NEUROLOGIC: Cranial nerves II through XII are intact. No gross focal neurological deficits. Sensation intact. Reflexes intact.  SKIN: No ulceration, lesions, rashes, or cyanosis. Skin warm and dry. Turgor intact.  PSYCHIATRIC: Mood, affect within normal limits. The patient is awake, alert and oriented x 3. Insight, judgment intact.    LABORATORY PANEL:   CBC  Recent Labs Lab 03/11/16 0635  WBC 8.6  HGB 14.3  HCT 42.5  PLT 183    ------------------------------------------------------------------------------------------------------------------  Chemistries   Recent Labs Lab 03/11/16 0635  NA 141  K 3.8  CL 104  CO2 29  GLUCOSE 129*  BUN 24*  CREATININE 1.12*  CALCIUM 9.7  MG 2.0   ------------------------------------------------------------------------------------------------------------------  Cardiac Enzymes  Recent Labs Lab 03/11/16 0635  TROPONINI <0.03   ------------------------------------------------------------------------------------------------------------------  RADIOLOGY:  Dg Chest Port 1 View  03/11/2016  CLINICAL DATA:  Acute onset the sensation of heart racing this morning. EXAM: PORTABLE CHEST 1 VIEW COMPARISON:  Single-view of the chest 03/04/2016 and 05/05/2015. FINDINGS: There is cardiomegaly without edema. Small focus of linear atelectasis left lung base is noted. Small calcified granuloma right lung base is again seen. No pneumothorax or pleural effusion. IMPRESSION: No acute disease. Electronically Signed   By: Drusilla Kanner M.D.   On: 03/11/2016 07:08    EKG:   Orders placed or performed during the hospital encounter of 03/11/16  . ED EKG within 10 minutes  . ED EKG within 10 minutes    IMPRESSION AND PLAN:   82 year old Caucasian female history of chronic atrial fibrillation followed by Dr. Lady Gary of [facility] clinic presenting with palpitations  1. Atrial fibrillation rapid ventricular response: Patient took extra dose of metoprolol without relief still  uncontrolled and emergency department, placed her on Cardizem drip with better control anticipate being able to take her off of the trip later today hopefully, check TSH troponins consult cardiology. Atrial fibrillation has been a recurring problem with her and it seems that she has been difficult to control in the past.: Continue eliquis 2. Essential hypertension: Hold hydrochlorothiazide continue with her Lopressor 3.  Hyperlipidemia unspecified Zocor 4. Venous thrombi embolism prophylactic: Therapeutic eliquis    All the records are reviewed and case discussed with ED provider. Management plans discussed with the patient, family and they are in agreement.  CODE STATUS: dnr  TOTAL TIME TAKING CARE OF THIS PATIENT: 33 minutes.    Saphyre Cillo,  Mardi Mainland.D on 03/11/2016 at 9:13 AM  Between 7am to 6pm - Pager - [phone]  After 6pm: House Pager: - [phone]  [facility]  Office  [phone]  CC: Primary care physician; [name], Lavada Mesi, MD

## 2016-03-11 NOTE — Progress Notes (Signed)
Patients heart rate dropping into th 50's dr hower notified orders to DC cardizem drip received.

## 2016-03-11 NOTE — ED Notes (Signed)
Pt presents to ED with report that she is in A-fib. Pt states she was seen in this ED 6/13 for the same and was medicated and dent home. Pt denies chest pain or sob. Pt reports that she woke up feeling like her heart was pounding in her chest. Seen by Dr. Lady GaryFath on 6/14 and medication was increased

## 2016-03-12 LAB — TROPONIN I

## 2016-03-12 MED ORDER — METOPROLOL TARTRATE 25 MG PO TABS: 12.5000 mg | ORAL_TABLET | Freq: Four times a day (QID) | ORAL | Status: DC | PRN

## 2016-03-12 MED ORDER — GI COCKTAIL ~~LOC~~: 30.0000 mL | Freq: Once | ORAL | Status: DC

## 2016-03-12 MED ORDER — SOTALOL HCL 80 MG PO TABS
80.0000 mg | ORAL_TABLET | Freq: Two times a day (BID) | ORAL | Status: DC
  Administered 2016-03-12 – 2016-03-13 (×3): 80 mg via ORAL
  Filled 2016-03-12 (×3): qty 1

## 2016-03-12 MED ORDER — METHIMAZOLE 5 MG PO TABS
5.0000 mg | ORAL_TABLET | Freq: Every day | ORAL | Status: DC
  Administered 2016-03-12 – 2016-03-13 (×2): 5 mg via ORAL
  Filled 2016-03-12 (×2): qty 1

## 2016-03-12 NOTE — Consult Note (Signed)
Reason for Consult: Atrial fibrillation paroxysmal palpitations Referring Physician: Dr. Valentino Nose hospitalist, Dr. Baldemar Lenis primary  Colleen Carson is an 82 y.o. female.  HPI: Patient known atrial fibrillation paroxysmal rate control with diltiazem and metoprolol patient also anticoagulation she's had multiple episodes in the last few weeks. Mostly palpitations or tachycardia no blackout spells or syncope. Patient was recently admitted a few weeks ago and now returns with recurrent episodes of palpitations or tachycardia. Patient's had heart rates up to 140. Patient denies any chest pain has persistent leg edema from lymphedema. Now here for follow-up evaluation and therapy. Patient scheduled for EPas an outpatient but came back to the emergency room for recurrent A. Fib. Patient denies any significant bleeding while on Eliquis.  Past Medical History  Diagnosis Date  . Hypertension   . Dysrhythmia     Afib  . Hypercholesteremia   . Lymphedema     lower extremities  . Stroke [facility])     noted ischemia on CT scan  . Shingles   . Arthritis   . S/P total hip arthroplasty 01/25/2015  . Atrial fibrillation [facility])     Past Surgical History  Procedure Laterality Date  . Joint replacement  01/23/15    Left THR Dr. Rudene Christians   . Total hip arthroplasty Left 01/23/2015    Procedure: TOTAL HIP ARTHROPLASTY ANTERIOR APPROACH;  Surgeon: Hessie Knows, MD;  Location: [facility] ORS;  Service: Orthopedics;  Laterality: Left;    Family History  Problem Relation Age of Onset  . Hypertension Other     Social History:  reports that she has never smoked. She has never used smokeless tobacco. She reports that she does not drink alcohol or use illicit drugs.  Allergies:  Allergies  Allergen Reactions  . Morphine And Related Nausea And Vomiting    Medications: I have reviewed the patient's current medications.  Results for orders placed or performed during the hospital encounter of 03/11/16 (from the past 48  hour(s))  Basic metabolic panel     Status: Abnormal   Collection Time: 03/11/16  6:35 AM  Result Value Ref Range   Sodium 141 135 - 145 mmol/L   Potassium 3.8 3.5 - 5.1 mmol/L   Chloride 104 101 - 111 mmol/L   CO2 29 22 - 32 mmol/L   Glucose, Bld 129 (H) 65 - 99 mg/dL   BUN 24 (H) 6 - 20 mg/dL   Creatinine, Ser 1.12 (H) 0.44 - 1.00 mg/dL   Calcium 9.7 8.9 - 10.3 mg/dL   GFR calc non Af Amer 42 (L) >60 mL/min   GFR calc Af Amer 49 (L) >60 mL/min    Comment: (NOTE) The eGFR has been calculated using the CKD EPI equation. This calculation has not been validated in all clinical situations. eGFR's persistently <60 mL/min signify possible Chronic Kidney Disease.    Anion gap 8 5 - 15  CBC     Status: None   Collection Time: 03/11/16  6:35 AM  Result Value Ref Range   WBC 8.6 3.6 - 11.0 K/uL   RBC 4.79 3.80 - 5.20 MIL/uL   Hemoglobin 14.3 12.0 - 16.0 g/dL   HCT 42.5 35.0 - 47.0 %   MCV 88.6 80.0 - 100.0 fL   MCH 29.7 26.0 - 34.0 pg   MCHC 33.6 32.0 - 36.0 g/dL   RDW 14.4 11.5 - 14.5 %   Platelets 183 150 - 440 K/uL  Troponin I     Status: None   Collection  Time: 03/11/16  6:35 AM  Result Value Ref Range   Troponin I <0.03 <0.031 ng/mL    Comment:        NO INDICATION OF MYOCARDIAL INJURY.   Magnesium     Status: None   Collection Time: 03/11/16  6:35 AM  Result Value Ref Range   Magnesium 2.0 1.7 - 2.4 mg/dL  Brain natriuretic peptide     Status: Abnormal   Collection Time: 03/11/16  6:35 AM  Result Value Ref Range   B Natriuretic Peptide 365.0 (H) 0.0 - 100.0 pg/mL  TSH     Status: Abnormal   Collection Time: 03/11/16  6:35 AM  Result Value Ref Range   TSH 0.050 (L) 0.350 - 4.500 uIU/mL  Troponin I (q 6hr x 3)     Status: None   Collection Time: 03/11/16 12:25 PM  Result Value Ref Range   Troponin I <0.03 <0.031 ng/mL    Comment:        NO INDICATION OF MYOCARDIAL INJURY.   Troponin I (q 6hr x 3)     Status: None   Collection Time: 03/11/16  6:08 PM  Result  Value Ref Range   Troponin I <0.03 <0.031 ng/mL    Comment:        NO INDICATION OF MYOCARDIAL INJURY.   T4, free     Status: Abnormal   Collection Time: 03/11/16  6:08 PM  Result Value Ref Range   Free T4 1.79 (H) 0.61 - 1.12 ng/dL    Comment: (NOTE) Biotin ingestion may interfere with free T4 tests. If the results are inconsistent with the TSH level, previous test results, or the clinical presentation, then consider biotin interference. If needed, order repeat testing after stopping biotin.   Troponin I (q 6hr x 3)     Status: None   Collection Time: 03/11/16 11:57 PM  Result Value Ref Range   Troponin I <0.03 <0.031 ng/mL    Comment:        NO INDICATION OF MYOCARDIAL INJURY.     Dg Chest Port 1 View  03/11/2016  CLINICAL DATA:  Acute onset the sensation of heart racing this morning. EXAM: PORTABLE CHEST 1 VIEW COMPARISON:  Single-view of the chest 03/04/2016 and 05/05/2015. FINDINGS: There is cardiomegaly without edema. Small focus of linear atelectasis left lung base is noted. Small calcified granuloma right lung base is again seen. No pneumothorax or pleural effusion. IMPRESSION: No acute disease. Electronically Signed   By: Inge Rise M.D.   On: 03/11/2016 07:08    Review of Systems  Constitutional: Positive for malaise/fatigue.  HENT: Negative.   Eyes: Negative.   Respiratory: Positive for shortness of breath.   Cardiovascular: Positive for palpitations and leg swelling.  Gastrointestinal: Negative.   Genitourinary: Negative.   Musculoskeletal: Negative.   Skin: Negative.   Neurological: Positive for weakness.  Endo/Heme/Allergies: Negative.   Psychiatric/Behavioral: Negative.    Blood pressure 129/61, pulse 64, temperature 98.4 F (36.9 C), temperature source Oral, resp. rate 18, height _0  (1.575 m), weight 69.99 kg (154 lb 4.8 oz), SpO2 96 %. Physical Exam  Nursing note and vitals reviewed. Constitutional: She is oriented to person, place, and time.  She appears well-developed and well-nourished.  HENT:  Head: Normocephalic and atraumatic.  Eyes: Conjunctivae and EOM are normal. Pupils are equal, round, and reactive to light.  Neck: Normal range of motion. Neck supple.  Cardiovascular: Normal pulses.  An irregularly irregular rhythm present. Tachycardia present.   Murmur  heard.  Systolic murmur is present with a grade of 2/6  Respiratory: Effort normal and breath sounds normal.  GI: Soft. Bowel sounds are normal.  Musculoskeletal: Normal range of motion.  Neurological: She is alert and oriented to person, place, and time. She has normal reflexes.  Skin: Skin is warm and dry.  Psychiatric: She has a normal mood and affect.    Assessment/Plan: Atrial fibrillation paroxysmal Hypertension  Hyperlipidemia Lymphedema CVA by history  DJD . PLAN Agree with telemetry Continue Eliquis anticoagulation Rate control with diltiazem and metoprolol Start sotalol 80 mg twice a day for antiarrhythmic control Continue simvastatin for hyperlipidemia Consider support stockings for lymphedema Increase activity with ambulation Consider reducing Lopressor since sotalol as been started Agree with Flonase for sinus trouble Recommend EP evaluation as an outpatient CALLWOOD,DWAYNE D. 03/12/2016, 8:45 AM

## 2016-03-12 NOTE — Progress Notes (Signed)
[facility] [place] [facility]   PATIENT NAME: Colleen MoundHilda Carson    MRN#:  [id number]  DATE OF BIRTH:  05-Mar-1925  SUBJECTIVE:  Hospital Day: 1 day Colleen Carson is a 82 y.o. female presenting with Atrial Fibrillation .   Overnight events: No overnight events, I discontinued Cardizem drip yesterday Interval Events: No complaints this morning  REVIEW OF SYSTEMS:  CONSTITUTIONAL: No fever, fatigue or weakness.  EYES: No blurred or double vision.  EARS, NOSE, AND THROAT: No tinnitus or ear pain.  RESPIRATORY: No cough, shortness of breath, wheezing or hemoptysis.  CARDIOVASCULAR: No chest pain, orthopnea, edema.  GASTROINTESTINAL: No nausea, vomiting, diarrhea or abdominal pain.  GENITOURINARY: No dysuria, hematuria.  ENDOCRINE: No polyuria, nocturia,  HEMATOLOGY: No anemia, easy bruising or bleeding SKIN: No rash or lesion. MUSCULOSKELETAL: No joint pain or arthritis.   NEUROLOGIC: No tingling, numbness, weakness.  PSYCHIATRY: No anxiety or depression.   DRUG ALLERGIES:   Allergies  Allergen Reactions  . Morphine And Related Nausea And Vomiting    VITALS:  Blood pressure 151/65, pulse 55, temperature 97.9 F (36.6 C), temperature source Oral, resp. rate 16, height 5\' 2"  (1.575 m), weight 154 lb 4.8 oz (69.99 kg), SpO2 96 %.  PHYSICAL EXAMINATION:  VITAL SIGNS: Filed Vitals:   03/12/16 0504 03/12/16 1108  BP: 129/61 151/65  Pulse: 64 55  Temp: 98.4 F (36.9 C) 97.9 F (36.6 C)  Resp: 18 16   GENERAL:82 y.o.female currently in no acute distress.  HEAD: Normocephalic, atraumatic.  EYES: Pupils equal, round, reactive to light. Extraocular muscles intact. No scleral icterus.  MOUTH: Moist mucosal membrane. Dentition intact. No abscess noted.  EAR, NOSE, THROAT: Clear without exudates. No external lesions.  NECK: Supple. No thyromegaly. No nodules. No JVD.  PULMONARY: Clear to ascultation, without wheeze rails or rhonci. No use of accessory  muscles, Good respiratory effort. good air entry bilaterally CHEST: Nontender to palpation.  CARDIOVASCULAR: S1 and S2.Irregular rate and rhythm. No murmurs, rubs, or gallops. No edema. Pedal pulses 2+ bilaterally.  GASTROINTESTINAL: Soft, nontender, nondistended. No masses. Positive bowel sounds. No hepatosplenomegaly.  MUSCULOSKELETAL: No swelling, clubbing, or edema. Range of motion full in all extremities.  NEUROLOGIC: Cranial nerves II through XII are intact. No gross focal neurological deficits. Sensation intact. Reflexes intact.  SKIN: No ulceration, lesions, rashes, or cyanosis. Skin warm and dry. Turgor intact.  PSYCHIATRIC: Mood, affect within normal limits. The patient is awake, alert and oriented x 3. Insight, judgment intact.      LABORATORY PANEL:   CBC  Recent Labs Lab 03/11/16 0635  WBC 8.6  HGB 14.3  HCT 42.5  PLT 183   ------------------------------------------------------------------------------------------------------------------  Chemistries   Recent Labs Lab 03/11/16 0635  NA 141  K 3.8  CL 104  CO2 29  GLUCOSE 129*  BUN 24*  CREATININE 1.12*  CALCIUM 9.7  MG 2.0   ------------------------------------------------------------------------------------------------------------------  Cardiac Enzymes  Recent Labs Lab 03/11/16 2357  TROPONINI <0.03   ------------------------------------------------------------------------------------------------------------------  RADIOLOGY:  Dg Chest Port 1 View  03/11/2016  CLINICAL DATA:  Acute onset the sensation of heart racing this morning. EXAM: PORTABLE CHEST 1 VIEW COMPARISON:  Single-view of the chest 03/04/2016 and 05/05/2015. FINDINGS: There is cardiomegaly without edema. Small focus of linear atelectasis left lung base is noted. Small calcified granuloma right lung base is again seen. No pneumothorax or pleural effusion. IMPRESSION: No acute disease. Electronically Signed   By: Drusilla Kannerhomas  Dalessio M.D.    On: 03/11/2016 07:08  EKG:   Orders placed or performed during the hospital encounter of 03/11/16  . ED EKG within 10 minutes  . ED EKG within 10 minutes  . ED EKG  . ED EKG    ASSESSMENT AND PLAN:   Colleen Carson is a 82 y.o. female presenting with Atrial Fibrillation . Admitted 03/11/2016 : Day #: 1 day 1. Atrial fibrillation rapid ventricular response: Cardiology input appreciated, heart rate better controlled today Switched to sotalol-followed telemetry plan discharge tomorrow 2. Abnormal thyroid function tests: Elevated free T4 decreased TSH will consult endocrinology for their input as this may have a factor in A. fib 3. Essential hypertension Lopressor 4. Hyperlipidemia unspecified Zocor   All the records are reviewed and case discussed with Care Management/Social Workerr. Management plans discussed with the patient, family and they are in agreement.  CODE STATUS: dnr TOTAL TIME TAKING CARE OF THIS PATIENT: 33 minutes.   POSSIBLE D/C IN 1DAYS, DEPENDING ON CLINICAL CONDITION.   [name],  Mardi Mainland.D on 03/12/2016 at 2:04 PM  Between 7am to 6pm - Pager - [phone]  After 6pm: House Pager: - [phone]  [facility]  Office  [phone]  CC: Primary care physician; Rozanna Box, MD

## 2016-03-12 NOTE — Progress Notes (Signed)
Alert and oriented. Vitals remain stable. Heart rate in the low 60's and high 50's at times. Complained of a headache once, relieved with tylenol. Given education on new medications today and family updated with plan of care. Patient will most likely discharge tomorrow.

## 2016-03-12 NOTE — Care Management (Signed)
Presents with exacerbation of her chronic atrial fib.  On chronic Eliquis.  requiring medication adjustments.  Lives with her daughter. Independent in all adls, denies issues accessing medical care, obtaining medications or with transportation. Uses walker when she goes out on walks in her neighborhood due to uneven sidewalks.  Current with her PCP.  No discharge needs identified at present by care manager or members of care team

## 2016-03-12 NOTE — Progress Notes (Signed)
Patient reports she had an episode where she couldn't get her words out and her lip quivered some. Patient states "It's like I just couldn't get my words together." Patient states she has never done this before. Dr. Clint GuyHower on unit and notified. No new orders at this time. Instructed patient to inform staff if this happens again.

## 2016-03-12 NOTE — Consult Note (Signed)
Endocrine Initial Consult Note Date of Consult: 03/12/2016  Consulting Service: [facility] Endocrinology  Service Requesting Consult: Dr. Clint Guy  SUBJECTIVE: Reason for Consultation: low TSH, high free T4  History of Present Illness: Colleen Carson is a 82 y.o. female with past medical history of atrial fibrillation who presented with palpitation and was admitted in A. fib with rapid ventricular rate.  She was hospitalized just 1 week prior after a similar episode.  Endocrinology has been consulted due to a low TSH and elevated free T4 level detected during her evaluation.  She has no history of thyroid disease and is not aware of any family history either.  She has had atrial fibrillation for at least 5 years now.  She denies any history of amiodarone use.  Over the past 2 or 3 years she has lost 20+ pounds.  More recently, she has noticed increased palpitations, heat intolerance, shakiness, fatigue, and poor concentration.   Patient Active Problem List   Diagnosis Date Noted  . Atrial fibrillation with rapid ventricular response (HCC) 03/11/2016  . AKI (acute kidney injury) (HCC) 05/06/2015  . S/P total hip arthroplasty 01/25/2015  . Primary osteoarthritis of left hip 01/23/2015     Past Medical History  Diagnosis Date  . Hypertension   . Dysrhythmia     Afib  . Hypercholesteremia   . Lymphedema     lower extremities  . Stroke [facility])     noted ischemia on CT scan  . Shingles   . Arthritis   . S/P total hip arthroplasty 01/25/2015  . Atrial fibrillation [facility])    Past Surgical History  Procedure Laterality Date  . Joint replacement  01/23/15    Left THR Dr. Rosita Kea   . Total hip arthroplasty Left 01/23/2015    Procedure: TOTAL HIP ARTHROPLASTY ANTERIOR APPROACH;  Surgeon: Kennedy Bucker, MD;  Location: [facility] ORS;  Service: Orthopedics;  Laterality: Left;   Family History  Problem Relation Age of Onset  . Hypertension Other     Social History:  Social History  Substance Use  Topics  . Smoking status: Never Smoker   . Smokeless tobacco: Never Used  . Alcohol Use: No      Allergies  Allergen Reactions  . Morphine And Related Nausea And Vomiting     Medications:  Scheduled Meds: . apixaban  5 mg Oral BID  . gi cocktail  30 mL Oral Once  .      . multivitamin with minerals  1 tablet Oral Daily  . simvastatin  20 mg Oral QPM  . sodium chloride flush  3 mL Intravenous Q12H  . sotalol  80 mg Oral Q12H   Continuous Infusions:  PRN Meds:.acetaminophen **OR** acetaminophen, fluticasone, metoprolol tartrate, ondansetron **OR** ondansetron (ZOFRAN) IV Review of Systems: As in HPI. Otherwise a 10 pt ROS was negative.  OBJECTIVE: Temp:  [97.7 F (36.5 C)-98.4 F (36.9 C)] 97.9 F (36.6 C) (06/21 1108) Pulse Rate:  [55-86] 55 (06/21 1108) Resp:  [16-18] 16 (06/21 1108) BP: (124-157)/(57-84) 151/65 mmHg (06/21 1108) SpO2:  [96 %] 96 % (06/21 1108)  Temp (24hrs), Avg:98 F (36.7 C), Min:97.7 F (36.5 C), Max:98.4 F (36.9 C)  Weight: 69.99 kg (154 lb 4.8 oz)  Physical Exam: Gen: no acute distress, well-nourished, well-appearing HEENT: [place]/AT, eyes anicteric, EOMI, mucous membranes moist, no oropharyngeal lesions Neck: no thyroid enlargement or nodules noted, no cervical lymphadenopathy CAD: regular rate, regular rhythm, s1, s2 PULM: clear to ausculation, no wheezes, rhonchi or rales. GI:  soft, non tender, non distended. EXT: mild non-pitting edema bilaterally, varicose veins, 2+ patellar reflexes bilaterally Skin: warm, dry, no rash Neuro: having some difficulty finding her words but speech is clear, no slurring. No facial assymetry, CN II-XII in tact, 4/5 strength in the extremities bilaterally, equal sensation to light touch on the face and extremities bilaterally, gait not tested, alert and oriented x 3, appropriately answering questions   BMP Latest Ref Rng 03/11/2016 03/04/2016 09/22/2015  Glucose 65 - 99 mg/dL 098(J129(H) 191(Y113(H) 782(N229(H)  BUN 6 - 20  mg/dL 56(O24(H) 13(Y25(H) 86(V30(H)  Creatinine 0.44 - 1.00 mg/dL 7.84(O1.12(H) 9.620.83 9.520.92  Sodium 135 - 145 mmol/L 141 140 140  Potassium 3.5 - 5.1 mmol/L 3.8 3.8 3.5  Chloride 101 - 111 mmol/L 104 104 101  CO2 22 - 32 mmol/L 29 27 30   Calcium 8.9 - 10.3 mg/dL 9.7 9.3 9.5   CBC Latest Ref Rng 03/11/2016 03/04/2016 09/22/2015  WBC 3.6 - 11.0 K/uL 8.6 7.1 6.1  Hemoglobin 12.0 - 16.0 g/dL 84.114.3 32.413.7 40.114.0  Hematocrit 35.0 - 47.0 % 42.5 41.0 42.2  Platelets 150 - 440 K/uL 183 166 195    ASSESSMENT:  Hyperthyroidism -  DDx includes toxic nodular goiter, Graves' disease A. Fib with RVR - improving  RECOMMENDATIONS:   Check free T3 level Start methimazole 5 mg daily Beta blockers per Cardiology Pt will see me outpatient at 4 weeks post-discharge Thank you for this consult.  Doylene CanningAbby Demetruis Depaul, MD [facility] Endocrinology

## 2016-03-13 LAB — T3, FREE: T3 FREE: 6.1 pg/mL — AB (ref 2.0–4.4)

## 2016-03-13 MED ORDER — METHIMAZOLE 5 MG PO TABS
5.0000 mg | ORAL_TABLET | Freq: Every day | ORAL | Status: DC
Start: 2016-03-13 — End: 2016-08-29

## 2016-03-13 MED ORDER — SOTALOL HCL 80 MG PO TABS: 80.0000 mg | ORAL_TABLET | Freq: Two times a day (BID) | ORAL | Status: DC

## 2016-03-13 NOTE — Care Management Important Message (Signed)
Important Message  Patient Details  Name: Colleen DawleyHilda M Wollin MRN: [id number] Date of Birth: August 20, 1925   Medicare Important Message Given:  Yes    Olegario MessierKathy A Davielle Lingelbach 03/13/2016, 10:05 AM

## 2016-03-13 NOTE — Progress Notes (Signed)
Patient alert and oriented.  VSS.  No complaints of pain.  HR in low 60s.    Rx called into [facility].  Family at bedside.  No questions at this time.  Escorted out of hospital via wheelchair by volunteers.

## 2016-03-13 NOTE — Discharge Summary (Signed)
[facility] [place] [facility]   PATIENT NAME: Colleen Carson    MR#:  [id number]  DATE OF BIRTH:  06-Aug-1925  DATE OF ADMISSION:  03/11/2016 ADMITTING PHYSICIAN: Wyatt Haste, MD  DATE OF DISCHARGE: 03/13/2016  1:09 PM  PRIMARY CARE PHYSICIAN: [name], [name], MD    ADMISSION DIAGNOSIS:  Generalized weakness [R53.1] Atrial fibrillation with RVR (HCC) [I48.91]  DISCHARGE DIAGNOSIS:  Active Problems:   Atrial fibrillation with rapid ventricular response (HCC)   SECONDARY DIAGNOSIS:   Past Medical History  Diagnosis Date  . Hypertension   . Dysrhythmia     Afib  . Hypercholesteremia   . Lymphedema     lower extremities  . Stroke [facility])     noted ischemia on CT scan  . Shingles   . Arthritis   . S/P total hip arthroplasty 01/25/2015  . Atrial fibrillation [facility])     HOSPITAL COURSE:   82 year old female with past medical history of previous CVA, atrial fibrillation, shingles, osteoarthritis, hyperlipidemia presented to the hospital due to atrial fibrillation with rapid ventricular response.  1. Atrial fibrillation with rapid ventricular response-patient was initially admitted to the hospital and started on a Cardizem drip. She was shortly after tapered off the drip and started on oral sotalol. -Patient's heart rates have been significantly well controlled on oral sotalol. She will be discharged on a sotalol and also as needed metoprolol if her heart rates were increased. -She will continue her anticoagulation with Eliquis  2. Subclinical hyperthyroidism-patient was noted to have a low TSH, with elevated free T3 and total T4. -Endocrinology consult obtained. Patient was started on methimazole which is being prescribed for her upon discharge -She will follow-up with endocrinology for follow-up blood tests and for workup of possible Graves' disease/hyperthyroidism.  3. Hyperlipidemia-patient will resume her simvastatin.  4 essential hypertension patient  will resume her HCTZ.  DISCHARGE CONDITIONS:   Stable  CONSULTS OBTAINED:  Treatment Team:  Alwyn Pea, MD Abby Lanetta Inch, MD  DRUG ALLERGIES:   Allergies  Allergen Reactions  . Morphine And Related Nausea And Vomiting    DISCHARGE MEDICATIONS:   Discharge Medication List as of 03/13/2016 12:42 PM    START taking these medications   Details  methimazole (TAPAZOLE) 5 MG tablet Take 1 tablet (5 mg total) by mouth daily., Starting 03/13/2016, Until Discontinued, Print    sotalol (BETAPACE) 80 MG tablet Take 1 tablet (80 mg total) by mouth every 12 (twelve) hours., Starting 03/13/2016, Until Discontinued, Print      CONTINUE these medications which have NOT CHANGED   Details  apixaban (ELIQUIS) 5 MG TABS tablet Take 5 mg by mouth 2 (two) times daily., Until Discontinued, Historical Med    cephALEXin (KEFLEX) 500 MG capsule Take 2,000 mg by mouth daily as needed (1 hour prior to dental appointment). , Until Discontinued, Historical Med    fluticasone (FLONASE) 50 MCG/ACT nasal spray Place 2 sprays into both nostrils daily as needed for rhinitis. , Until Discontinued, Historical Med    hydrochlorothiazide (HYDRODIURIL) 12.5 MG tablet Take 12.5 mg by mouth daily. , Until Discontinued, Historical Med    metoprolol tartrate (LOPRESSOR) 25 MG tablet One tab every 12 hours prn heart rate over 110, Print    Multiple Vitamins-Minerals (CENTRUM SILVER PO) Take 1 tablet by mouth daily., Until Discontinued, Historical Med    simvastatin (ZOCOR) 20 MG tablet Take 20 mg by mouth every evening. , Until Discontinued, Historical Med  DISCHARGE INSTRUCTIONS:   DIET:  Cardiac diet  DISCHARGE CONDITION:  Stable  ACTIVITY:  Activity as tolerated  OXYGEN:  Home Oxygen: No.   Oxygen Delivery: room air  DISCHARGE LOCATION:  home   If you experience worsening of your admission symptoms, develop shortness of breath, life threatening emergency, suicidal or  homicidal thoughts you must seek medical attention immediately by calling 911 or calling your MD immediately  if symptoms less severe.  You Must read complete instructions/literature along with all the possible adverse reactions/side effects for all the Medicines you take and that have been prescribed to you. Take any new Medicines after you have completely understood and accpet all the possible adverse reactions/side effects.   Please note  You were cared for by a hospitalist during your hospital stay. If you have any questions about your discharge medications or the care you received while you were in the hospital after you are discharged, you can call the unit and asked to speak with the hospitalist on call if the hospitalist that took care of you is not available. Once you are discharged, your primary care physician will handle any further medical issues. Please note that NO REFILLS for any discharge medications will be authorized once you are discharged, as it is imperative that you return to your primary care physician (or establish a relationship with a primary care physician if you do not have one) for your aftercare needs so that they can reassess your need for medications and monitor your lab values.     Today   No palpitations. Heart rate stable. No chest pain, shortness of breath. Wants to go home.  VITAL SIGNS:  Blood pressure 138/58, pulse 49, temperature 97.7 F (36.5 C), temperature source Oral, resp. rate 20, height 5\' 2"  (1.575 m), weight 69.99 kg (154 lb 4.8 oz), SpO2 93 %.  I/O:   Intake/Output Summary (Last 24 hours) at 03/13/16 1605 Last data filed at 03/13/16 0900  Gross per 24 hour  Intake    480 ml  Output    250 ml  Net    230 ml    PHYSICAL EXAMINATION:  GENERAL:  82 y.o.-year-old patient lying in the bed with no acute distress.  EYES: Pupils equal, round, reactive to light and accommodation. No scleral icterus. Extraocular muscles intact.  HEENT: Head  atraumatic, normocephalic. Oropharynx and nasopharynx clear.  NECK:  Supple, no jugular venous distention. No thyroid enlargement, no tenderness.  LUNGS: Normal breath sounds bilaterally, no wheezing, rales,rhonchi. No use of accessory muscles of respiration.  CARDIOVASCULAR: S1, S2 normal. No murmurs, rubs, or gallops.  ABDOMEN: Soft, non-tender, non-distended. Bowel sounds present. No organomegaly or mass.  EXTREMITIES: No pedal edema, cyanosis, or clubbing.  NEUROLOGIC: Cranial nerves II through XII are intact. No focal motor or sensory defecits b/l.  PSYCHIATRIC: The patient is alert and oriented x 3. Good affect.  SKIN: No obvious rash, lesion, or ulcer.   DATA REVIEW:   CBC  Recent Labs Lab 03/11/16 0635  WBC 8.6  HGB 14.3  HCT 42.5  PLT 183    Chemistries   Recent Labs Lab 03/11/16 0635  NA 141  K 3.8  CL 104  CO2 29  GLUCOSE 129*  BUN 24*  CREATININE 1.12*  CALCIUM 9.7  MG 2.0    Cardiac Enzymes  Recent Labs Lab 03/11/16 2357  TROPONINI <0.03    Microbiology Results  Results for orders placed or performed during the hospital encounter of 01/10/15  MRSA PCR  Screening     Status: None   Collection Time: 01/10/15  3:19 PM  Result Value Ref Range Status   Micro Text Report   Final       COMMENT                   NEGATIVE - MRSA target DNA not detected   ANTIBIOTIC                                                        RADIOLOGY:  No results found.    Management plans discussed with the patient, family and they are in agreement.  CODE STATUS:     Code Status Orders        Start     Ordered   03/11/16 0840  Do not attempt resuscitation (DNR)   Continuous    Question Answer Comment  In the event of cardiac or respiratory ARREST Do not call a "code blue"   In the event of cardiac or respiratory ARREST Do not perform Intubation, CPR, defibrillation or ACLS   In the event of cardiac or respiratory ARREST Use medication by any route,  position, wound care, and other measures to relive pain and suffering. May use oxygen, suction and manual treatment of airway obstruction as needed for comfort.      03/11/16 0839    Code Status History    Date Active Date Inactive Code Status Order ID Comments User Context   03/11/2016  8:39 AM 03/11/2016  8:39 AM Full Code [id number]  Wyatt Hasteavid K Hower, MD ED   05/06/2015  3:01 AM 05/07/2015  3:02 PM DNR [id number]  Arnaldo NatalMichael S Diamond, MD Inpatient   05/06/2015  1:57 AM 05/06/2015  3:01 AM Full Code [id number]  Arnaldo NatalMichael S Diamond, MD ED   01/23/2015 11:21 AM 01/26/2015  4:25 PM Full Code [id number]  Kennedy BuckerMichael Menz, MD Inpatient      TOTAL TIME TAKING CARE OF THIS PATIENT: 40 minutes.    [place] SirenSAINANI,VIVEK J M.D on 03/13/2016 at 4:05 PM  Between 7am to 6pm - Pager - [phone]  After 6pm go to [email or web address] - password [email or web address] [facility]  [facility]  Office  [phone]  CC: Primary care physician; [name], Lavada MesiMARC E, MD

## 2016-03-18 ENCOUNTER — Emergency Department: Payer: Medicare Other

## 2016-03-18 ENCOUNTER — Encounter: Payer: Self-pay | Admitting: Emergency Medicine

## 2016-03-18 ENCOUNTER — Emergency Department
Admission: EM | Admit: 2016-03-18 | Discharge: 2016-03-18 | Disposition: A | Discharge status: Home / Self Care | Payer: Medicare Other | Attending: Emergency Medicine | Admitting: Emergency Medicine

## 2016-03-18 DIAGNOSIS — Z8673 Personal history of transient ischemic attack (TIA), and cerebral infarction without residual deficits: Secondary | ICD-10-CM | POA: Insufficient documentation

## 2016-03-18 DIAGNOSIS — Z7951 Long term (current) use of inhaled steroids: Secondary | ICD-10-CM | POA: Insufficient documentation

## 2016-03-18 DIAGNOSIS — R000 Tachycardia, unspecified: Secondary | ICD-10-CM | POA: Diagnosis present

## 2016-03-18 DIAGNOSIS — I10 Essential (primary) hypertension: Secondary | ICD-10-CM | POA: Diagnosis not present

## 2016-03-18 DIAGNOSIS — Z792 Long term (current) use of antibiotics: Secondary | ICD-10-CM | POA: Diagnosis not present

## 2016-03-18 DIAGNOSIS — M1990 Unspecified osteoarthritis, unspecified site: Secondary | ICD-10-CM | POA: Insufficient documentation

## 2016-03-18 DIAGNOSIS — Z79899 Other long term (current) drug therapy: Secondary | ICD-10-CM | POA: Diagnosis not present

## 2016-03-18 DIAGNOSIS — I480 Paroxysmal atrial fibrillation: Secondary | ICD-10-CM | POA: Diagnosis not present

## 2016-03-18 DIAGNOSIS — I4891 Unspecified atrial fibrillation: Secondary | ICD-10-CM

## 2016-03-18 LAB — URINALYSIS COMPLETE WITH MICROSCOPIC (ARMC ONLY)
Bacteria, UA: NONE SEEN
Bilirubin Urine: NEGATIVE
GLUCOSE, UA: NEGATIVE mg/dL
Hgb urine dipstick: NEGATIVE
KETONES UR: NEGATIVE mg/dL
Leukocytes, UA: NEGATIVE
NITRITE: NEGATIVE
PROTEIN: NEGATIVE mg/dL
SPECIFIC GRAVITY, URINE: 1.009 (ref 1.005–1.030)
pH: 7 (ref 5.0–8.0)

## 2016-03-18 LAB — BASIC METABOLIC PANEL
Anion gap: 9 (ref 5–15)
BUN: 33 mg/dL — ABNORMAL HIGH (ref 6–20)
CHLORIDE: 102 mmol/L (ref 101–111)
CO2: 29 mmol/L (ref 22–32)
CREATININE: 0.93 mg/dL (ref 0.44–1.00)
Calcium: 9.7 mg/dL (ref 8.9–10.3)
GFR, EST NON AFRICAN AMERICAN: 53 mL/min — AB (ref >60)
Glucose, Bld: 102 mg/dL — ABNORMAL HIGH (ref 65–99)
POTASSIUM: 3.7 mmol/L (ref 3.5–5.1)
Sodium: 140 mmol/L (ref 135–145)

## 2016-03-18 LAB — CBC
HEMATOCRIT: 40.6 % (ref 35.0–47.0)
Hemoglobin: 13.9 g/dL (ref 12.0–16.0)
MCH: 30 pg (ref 26.0–34.0)
MCHC: 34.3 g/dL (ref 32.0–36.0)
MCV: 87.7 fL (ref 80.0–100.0)
PLATELETS: 179 10*3/uL (ref 150–440)
RBC: 4.62 MIL/uL (ref 3.80–5.20)
RDW: 14 % (ref 11.5–14.5)
WBC: 6.7 10*3/uL (ref 3.6–11.0)

## 2016-03-18 LAB — TROPONIN I

## 2016-03-18 LAB — TSH: TSH: 0.031 u[IU]/mL — ABNORMAL LOW (ref 0.350–4.500)

## 2016-03-18 LAB — T4, FREE: Free T4: 2.03 ng/dL — ABNORMAL HIGH (ref 0.61–1.12)

## 2016-03-18 MED ORDER — SODIUM CHLORIDE 0.9 % IV BOLUS (SEPSIS)
500.0000 mL | Freq: Once | INTRAVENOUS | Status: AC
Start: 2016-03-18 — End: 2016-03-18
  Administered 2016-03-18: 500 mL via INTRAVENOUS

## 2016-03-18 MED ORDER — DILTIAZEM HCL 25 MG/5ML IV SOLN
10.0000 mg | Freq: Once | INTRAVENOUS | Status: AC
  Administered 2016-03-18: 10 mg via INTRAVENOUS
  Filled 2016-03-18: qty 5

## 2016-03-18 NOTE — ED Provider Notes (Signed)
[facility] Emergency Department Provider Note   ____________________________________________  Time seen: Approximately1:45pm I have reviewed the triage vital signs and the triage nursing note.  HISTORY  Chief Complaint Tachycardia   Historian Patient and daughter (she lives with)  HPI Colleen Carson is a 82 y.o. female with history of paroxysmal afib, on metoprolol, recent hospitalization for afib rvr and had added sotolol and made po metoprolol 25mg  prn hr >110, and started on thyroid medication for hyperthyroid - here for fast hr.  Felt racing this am-- hr in 105-130s.  Took extra 25mg  toprol several hours after morning meds, and no improvement.  No chest pain, but palpitations and fatigue.  Occasional mild sob.  Denies being dehydrated, but states maybe some decreased amount of urination.    Past Medical History  Diagnosis Date  . Hypertension   . Dysrhythmia     Afib  . Hypercholesteremia   . Lymphedema     lower extremities  . Stroke [facility](HCC)     noted ischemia on CT scan  . Shingles   . Arthritis   . S/P total hip arthroplasty 01/25/2015  . Atrial fibrillation [facility](HCC)     Patient Active Problem List   Diagnosis Date Noted  . Atrial fibrillation with rapid ventricular response (HCC) 03/11/2016  . AKI (acute kidney injury) (HCC) 05/06/2015  . S/P total hip arthroplasty 01/25/2015  . Primary osteoarthritis of left hip 01/23/2015    Past Surgical History  Procedure Laterality Date  . Joint replacement  01/23/15    Left THR Dr. Rosita KeaMenz   . Total hip arthroplasty Left 01/23/2015    Procedure: TOTAL HIP ARTHROPLASTY ANTERIOR APPROACH;  Surgeon: Kennedy BuckerMichael Menz, MD;  Location: [facility] ORS;  Service: Orthopedics;  Laterality: Left;    Current Outpatient Rx  Name  Route  Sig  Dispense  Refill  . apixaban (ELIQUIS) 5 MG TABS tablet   Oral   Take 5 mg by mouth 2 (two) times daily.         . cephALEXin (KEFLEX) 500 MG capsule   Oral   Take 2,000 mg by mouth  daily as needed (1 hour prior to dental appointment).          . fluticasone (FLONASE) 50 MCG/ACT nasal spray   Each Nare   Place 2 sprays into both nostrils daily as needed for rhinitis.          . hydrochlorothiazide (HYDRODIURIL) 12.5 MG tablet   Oral   Take 12.5 mg by mouth daily.          . methimazole (TAPAZOLE) 5 MG tablet   Oral   Take 1 tablet (5 mg total) by mouth daily.   60 tablet   0   . metoprolol tartrate (LOPRESSOR) 25 MG tablet      One tab every 12 hours prn heart rate over 110 Patient taking differently: Take 25 mg by mouth every 12 (twelve) hours as needed (for heart rate over 110).    30 tablet   0   . Multiple Vitamins-Minerals (CENTRUM SILVER PO)   Oral   Take 1 tablet by mouth daily.         . simvastatin (ZOCOR) 20 MG tablet   Oral   Take 20 mg by mouth every evening.          . sotalol (BETAPACE) 80 MG tablet   Oral   Take 1 tablet (80 mg total) by mouth every 12 (twelve) hours.   60 tablet  1     Allergies Morphine and related  Family History  Problem Relation Age of Onset  . Hypertension Other     Social History Social History  Substance Use Topics  . Smoking status: Never Smoker   . Smokeless tobacco: Never Used  . Alcohol Use: No    Review of Systems  Constitutional: Negative for fever. Eyes: Negative for visual changes. ENT: Negative for sore throat. Cardiovascular: Negative for chest pain. Respiratory: Negative for cough or trouble breathing. Gastrointestinal: Negative for abdominal pain, vomiting and diarrhea. Genitourinary: Negative for dysuria. Musculoskeletal: Negative for back pain. Skin: Negative for rash. Neurological: Negative for headache. 10 point Review of Systems otherwise negative ____________________________________________   PHYSICAL EXAM:  VITAL SIGNS: ED Triage Vitals  Enc Vitals Group     BP 03/18/16 1339 141/91 mmHg     Pulse Rate 03/18/16 1339 92     Resp 03/18/16 1339 20      Temp 03/18/16 1339 97.8 F (36.6 C)     Temp Source 03/18/16 1339 Oral     SpO2 03/18/16 1339 98 %     Weight 03/18/16 1339 148 lb (67.132 kg)     Height 03/18/16 1339 5\' 2"  (1.575 m)     Head Cir --      Peak Flow --      Pain Score 03/18/16 1339 0     Pain Loc --      Pain Edu? --      Excl. in GC? --      Constitutional: Alert and oriented. Well appearing and in no distress. HEENT   Head: Normocephalic and atraumatic.      Eyes: Conjunctivae are normal. PERRL. Normal extraocular movements.      Ears:         Nose: No congestion/rhinnorhea.   Mouth/Throat: Mucous membranes are moist.   Neck: No stridor. Cardiovascular/Chest: Irregularly irregular and tachycardic.  No murmurs, rubs, or gallops. Respiratory: Normal respiratory effort without tachypnea nor retractions. Breath sounds are clear and equal bilaterally. No wheezes/rales/rhonchi. Gastrointestinal: Soft. No distention, no guarding, no rebound. Nontender.   Genitourinary/rectal:Deferred Musculoskeletal: Nontender with normal range of motion in all extremities. No joint effusions.  No lower extremity tenderness.  Trace edema ble. Neurologic:  Normal speech and language. No gross or focal neurologic deficits are appreciated. Skin:  Skin is warm, dry and intact. No rash noted. Psychiatric: Mood and affect are normal. Speech and behavior are normal. Patient exhibits appropriate insight and judgment.  ____________________________________________   EKG I, Governor Rooksebecca Aman Batley, MD, the attending physician have personally viewed and interpreted all ECGs.  111 bpm. Atrial fibrillation with rapid ventricular response. Left axis deviation. Left bundle branch block. ____________________________________________  LABS (pertinent positives/negatives)  Labs Reviewed  BASIC METABOLIC PANEL - Abnormal; Notable for the following:    Glucose, Bld 102 (*)    BUN 33 (*)    GFR calc non Af Amer 53 (*)    All other components within  normal limits  URINALYSIS COMPLETEWITH MICROSCOPIC ([facility] ONLY) - Abnormal; Notable for the following:    Color, Urine STRAW (*)    APPearance CLEAR (*)    Squamous Epithelial / LPF 0-5 (*)    All other components within normal limits  TSH - Abnormal; Notable for the following:    TSH 0.031 (*)    All other components within normal limits  T4, FREE - Abnormal; Notable for the following:    Free T4 2.03 (*)    All other  components within normal limits  CBC  TROPONIN I    ____________________________________________  RADIOLOGY All Xrays were viewed by me. Imaging interpreted by Radiologist.  Chest: mild cardiomegaly.  Aortic atherosclerosis. __________________________________________  PROCEDURES  Procedure(s) performed: None  Critical Care performed: CRITICAL CARE Performed by: Governor Rooks   Total critical care time: 30 minutes  Critical care time was exclusive of separately billable procedures and treating other patients.  Critical care was necessary to treat or prevent imminent or life-threatening deterioration.  Critical care was time spent personally by me on the following activities: development of treatment plan with patient and/or surrogate as well as nursing, discussions with consultants, evaluation of patient's response to treatment, examination of patient, obtaining history from patient or surrogate, ordering and performing treatments and interventions, ordering and review of laboratory studies, ordering and review of radiographic studies, pulse oximetry and re-evaluation of patient's condition.   ____________________________________________   ED COURSE / ASSESSMENT AND PLAN  Pertinent labs & imaging results that were available during my care of the patient were reviewed by me and considered in my medical decision making (see chart for details).   Patient has had recent episodes of atrial fibrillation with rapid ventricular response, recent increase in  metoprolol and found to be hyperthyroid.  She is on a increased dose of metoprolol, 1.5 tablets of 25 mg metoprolol twice daily, and an extra 25 mg tablet if she is experiencing symptoms of fast A. fib which she took this morning prior to arrival without resolution or improvement.  She is stable with blood pressure and no chest pain, I did treat with bolus of diltiazem.  HR at rest down to 80-105.  I spoke with Dr. Lady Gary, patient's cardiologist - recommends continue sotolol  and schedule metoprolol  with extra  prn hr >100.  I discussed observation admission vs home with medication adjustments given stability, labs reassuring, and hr improved after bolus here.   CONSULTATIONS:   Phone call with Dr. Lady Gary.   Patient / Family / Caregiver informed of clinical course, medical decision-making process, and agree with plan.   I discussed return precautions, follow-up instructions, and discharged instructions with patient and/or family.   ___________________________________________   FINAL CLINICAL IMPRESSION(S) / ED DIAGNOSES   Final diagnoses:  Atrial fibrillation with rapid ventricular response (HCC)              Note: This dictation was prepared with Dragon dictation. Any transcriptional errors that result from this process are unintentional   Governor Rooks, MD 03/18/16 1800

## 2016-03-18 NOTE — ED Notes (Addendum)
Pt to ED with her "heart racing".  Pt has history of atrial fib and is currently on metoprolol.  Pt denies dizziness or falling.

## 2016-03-18 NOTE — ED Notes (Signed)
This RN ambulated pt, pts HR increased to 120s upon walking

## 2016-03-18 NOTE — Discharge Instructions (Signed)
You were evaluated for rapid atrial fibrillation and after a dose of IV diltiazem here emergently, and your heart rate is regular. The rest of the exam and evaluation are reassuring today. After discussion with your cardiologist, he recommended to continue the sotalol at 80 mg twice a day at the same dose. He recommended taking the metoprolol 25 mg twice per day now and you may still take an additional 25 mg metoprolol tablet if your heart rate is greater than 110.  Called Dr. America BrownFath's office tomorrow to make an appointment for tomorrow the next day. Next and return to the emergency department immediately for any worsening symptoms including chest pain or trouble breathing, dizziness or passing out, weakness or numbness, confusion or altered mental status.   Atrial Fibrillation Atrial fibrillation is a type of heartbeat that is irregular or fast (rapid). If you have this condition, your heart keeps quivering in a weird (chaotic) way. This condition can make it so your heart cannot pump blood normally. Having this condition gives a person more risk for stroke, heart failure, and other heart problems. There are different types of atrial fibrillation. Talk with your doctor to learn about the type that you have. HOME CARE  Take over-the-counter and prescription medicines only as told by your doctor.  If your doctor prescribed a blood-thinning medicine, take it exactly as told. Taking too much of it can cause bleeding. If you do not take enough of it, you will not have the protection that you need against stroke and other problems.  Do not use any tobacco products. These include cigarettes, chewing tobacco, and e-cigarettes. If you need help quitting, ask your doctor.  If you have apnea (obstructive sleep apnea), manage it as told by your doctor.  Do not drink alcohol.  Do not drink beverages that have caffeine. These include coffee, soda, and tea.  Maintain a healthy weight. Do not use diet pills  unless your doctor says they are safe for you. Diet pills may make heart problems worse.  Follow diet instructions as told by your doctor.  Exercise regularly as told by your doctor.  Keep all follow-up visits as told by your doctor. This is important. GET HELP IF:  You notice a change in the speed, rhythm, or strength of your heartbeat.  You are taking a blood-thinning medicine and you notice more bruising.  You get tired more easily when you move or exercise. GET HELP RIGHT AWAY IF:  You have pain in your chest or your belly (abdomen).  You have sweating or weakness.  You feel sick to your stomach (nauseous).  You notice blood in your throw up (vomit), poop (stool), or pee (urine).  You are short of breath.  You suddenly have swollen feet and ankles.  You feel dizzy.  Your suddenly get weak or numb in your face, arms, or legs, especially if it happens on one side of your body.  You have trouble talking, trouble understanding, or both.  Your face or your eyelid droops on one side. These symptoms may be an emergency. Do not wait to see if the symptoms will go away. Get medical help right away. Call your local emergency services (911 in the [place] Do not drive yourself to the hospital.   This information is not intended to replace advice given to you by your health care provider. Make sure you discuss any questions you have with your health care provider.   Document Released: 06/17/2008 Document Revised: 05/30/2015 Document Reviewed: 01/03/2015  Elsevier Interactive Patient Education ©2016 [employer] ° °

## 2016-03-18 NOTE — ED Notes (Signed)
Patient transported to X-ray 

## 2016-05-14 ENCOUNTER — Emergency Department: Payer: Medicare Other

## 2016-05-14 ENCOUNTER — Encounter: Payer: Self-pay | Admitting: Emergency Medicine

## 2016-05-14 ENCOUNTER — Observation Stay
Admission: EM | Admit: 2016-05-14 | Discharge: 2016-05-15 | Disposition: A | Discharge status: Home / Self Care | Payer: Medicare Other | Attending: Internal Medicine | Admitting: Internal Medicine

## 2016-05-14 DIAGNOSIS — Z7901 Long term (current) use of anticoagulants: Secondary | ICD-10-CM | POA: Insufficient documentation

## 2016-05-14 DIAGNOSIS — Z96649 Presence of unspecified artificial hip joint: Secondary | ICD-10-CM | POA: Diagnosis not present

## 2016-05-14 DIAGNOSIS — E785 Hyperlipidemia, unspecified: Secondary | ICD-10-CM | POA: Diagnosis not present

## 2016-05-14 DIAGNOSIS — I119 Hypertensive heart disease without heart failure: Secondary | ICD-10-CM | POA: Diagnosis not present

## 2016-05-14 DIAGNOSIS — R001 Bradycardia, unspecified: Secondary | ICD-10-CM | POA: Diagnosis present

## 2016-05-14 DIAGNOSIS — Z8673 Personal history of transient ischemic attack (TIA), and cerebral infarction without residual deficits: Secondary | ICD-10-CM | POA: Diagnosis not present

## 2016-05-14 DIAGNOSIS — Z66 Do not resuscitate: Secondary | ICD-10-CM | POA: Diagnosis not present

## 2016-05-14 DIAGNOSIS — I517 Cardiomegaly: Secondary | ICD-10-CM | POA: Diagnosis not present

## 2016-05-14 DIAGNOSIS — T462X1A Poisoning by other antidysrhythmic drugs, accidental (unintentional), initial encounter: Secondary | ICD-10-CM

## 2016-05-14 DIAGNOSIS — I4891 Unspecified atrial fibrillation: Secondary | ICD-10-CM | POA: Diagnosis present

## 2016-05-14 DIAGNOSIS — I480 Paroxysmal atrial fibrillation: Secondary | ICD-10-CM | POA: Diagnosis present

## 2016-05-14 DIAGNOSIS — I700 Atherosclerosis of aorta: Secondary | ICD-10-CM | POA: Diagnosis not present

## 2016-05-14 DIAGNOSIS — I10 Essential (primary) hypertension: Secondary | ICD-10-CM | POA: Diagnosis present

## 2016-05-14 LAB — CBC WITH DIFFERENTIAL/PLATELET
BASOS ABS: 0.1 10*3/uL (ref 0–0.1)
BASOS PCT: 1 %
EOS ABS: 0.2 10*3/uL (ref 0–0.7)
EOS PCT: 3 %
HEMATOCRIT: 44.7 % (ref 35.0–47.0)
Hemoglobin: 14.9 g/dL (ref 12.0–16.0)
Lymphocytes Relative: 30 %
Lymphs Abs: 2.6 10*3/uL (ref 1.0–3.6)
MCH: 29.1 pg (ref 26.0–34.0)
MCHC: 33.2 g/dL (ref 32.0–36.0)
MCV: 87.6 fL (ref 80.0–100.0)
MONO ABS: 0.7 10*3/uL (ref 0.2–0.9)
Monocytes Relative: 9 %
NEUTROS ABS: 5 10*3/uL (ref 1.4–6.5)
Neutrophils Relative %: 57 %
PLATELETS: 207 10*3/uL (ref 150–440)
RBC: 5.1 MIL/uL (ref 3.80–5.20)
RDW: 14 % (ref 11.5–14.5)
WBC: 8.6 10*3/uL (ref 3.6–11.0)

## 2016-05-14 LAB — COMPREHENSIVE METABOLIC PANEL
ALBUMIN: 3.9 g/dL (ref 3.5–5.0)
ALT: 16 U/L (ref 14–54)
ANION GAP: 10 (ref 5–15)
AST: 31 U/L (ref 15–41)
Alkaline Phosphatase: 180 U/L — ABNORMAL HIGH (ref 38–126)
BUN: 24 mg/dL — AB (ref 6–20)
CHLORIDE: 104 mmol/L (ref 101–111)
CO2: 27 mmol/L (ref 22–32)
Calcium: 9.6 mg/dL (ref 8.9–10.3)
Creatinine, Ser: 1 mg/dL (ref 0.44–1.00)
GFR calc Af Amer: 56 mL/min — ABNORMAL LOW (ref >60)
GFR calc non Af Amer: 48 mL/min — ABNORMAL LOW (ref >60)
GLUCOSE: 156 mg/dL — AB (ref 65–99)
POTASSIUM: 3.7 mmol/L (ref 3.5–5.1)
SODIUM: 141 mmol/L (ref 135–145)
Total Bilirubin: 0.4 mg/dL (ref 0.3–1.2)
Total Protein: 6.9 g/dL (ref 6.5–8.1)

## 2016-05-14 LAB — TROPONIN I: Troponin I: <0.03 ng/mL (ref <0.03)

## 2016-05-14 MED ORDER — ATROPINE SULFATE 1 MG/10ML IJ SOSY
PREFILLED_SYRINGE | INTRAMUSCULAR | Status: AC
  Administered 2016-05-14: 0.5 mg via INTRAVENOUS
  Filled 2016-05-14: qty 10

## 2016-05-14 MED ORDER — ATROPINE SULFATE 1 MG/10ML IJ SOSY
0.5000 mg | PREFILLED_SYRINGE | Freq: Once | INTRAMUSCULAR | Status: AC
  Administered 2016-05-14: 0.5 mg via INTRAVENOUS

## 2016-05-14 MED ORDER — SODIUM CHLORIDE 0.9 % IV BOLUS (SEPSIS)
1000.0000 mL | Freq: Once | INTRAVENOUS | Status: AC
  Administered 2016-05-15: 1000 mL via INTRAVENOUS

## 2016-05-14 NOTE — ED Triage Notes (Addendum)
Pt to ED via EMS from home for uncontrolled A-fib all day. Pt states she has taken a total of 100mg  of metoprolol with no relief. Per EMS pt HR has been 25-120s en route, BP 138/78, 95% RA. Pt A&O, sitting up, conversing with nurses. states of some drowsiness. Denies CP or LOC . MD at bedside

## 2016-05-14 NOTE — ED Provider Notes (Signed)
[facility] Emergency Department Provider Note   ____________________________________________   First MD Initiated Contact with Patient 05/14/16 2311     (approximate)  I have reviewed the triage vital signs and the nursing notes.   HISTORY  Chief Complaint Atrial Fibrillation and Bradycardia    HPI Colleen Carson is a 82 y.o. female who presents to the ED from home via EMS with the chief complaint of low heart rate. Patient has a history of atrial fibrillation, on metoprolol and Eliquis who states she had been feeling like she was in rapid A. fib all day. She is supposed to take metoprolol 25 mg bid. However, she has been instructed to take extra metoprolol to break her rapid heart rate. Over the course of the day, patient took 100 mg of metoprolol. Her son-in-law had been checking her vital signs and states her blood pressure heart rate were fine until her heart rate dropped to the 20s. Patient states she was feeling lightheaded and woozy. Denies recent fever, chills, chest pain, shortness of breath, abdominal pain, nausea, vomiting, diarrhea. Denies recent travel or trauma. Nothing makes her symptoms better or worse.   Past Medical History:  Diagnosis Date  . Arthritis   . Atrial fibrillation (HCC)   . Dysrhythmia    Afib  . Hypercholesteremia   . Hypertension   . Lymphedema    lower extremities  . S/P total hip arthroplasty 01/25/2015  . Shingles   . Stroke [facility])    noted ischemia on CT scan    Patient Active Problem List   Diagnosis Date Noted  . Symptomatic bradycardia 05/15/2016  . HTN (hypertension) 05/15/2016  . HLD (hyperlipidemia) 05/15/2016  . Atrial fibrillation (HCC) 03/11/2016  . AKI (acute kidney injury) (HCC) 05/06/2015  . S/P total hip arthroplasty 01/25/2015  . Primary osteoarthritis of left hip 01/23/2015    Past Surgical History:  Procedure Laterality Date  . JOINT REPLACEMENT  01/23/15   Left THR Dr. Rosita Kea   . TOTAL HIP  ARTHROPLASTY Left 01/23/2015   Procedure: TOTAL HIP ARTHROPLASTY ANTERIOR APPROACH;  Surgeon: Kennedy Bucker, MD;  Location: [facility] ORS;  Service: Orthopedics;  Laterality: Left;    Prior to Admission medications   Medication Sig Start Date End Date Taking? Authorizing Provider  apixaban (ELIQUIS) 5 MG TABS tablet Take 5 mg by mouth 2 (two) times daily.   Yes Historical Provider, MD  fluticasone (FLONASE) 50 MCG/ACT nasal spray Place 2 sprays into both nostrils daily as needed for rhinitis.    Yes Historical Provider, MD  hydrochlorothiazide (HYDRODIURIL) 12.5 MG tablet Take 12.5 mg by mouth daily.    Yes Historical Provider, MD  methimazole (TAPAZOLE) 5 MG tablet Take 1 tablet (5 mg total) by mouth daily. 03/13/16  Yes Houston Siren, MD  metoprolol tartrate (LOPRESSOR) 25 MG tablet One tab every 12 hours prn heart rate over 110 Patient taking differently: Take 25 mg by mouth every 12 (twelve) hours as needed (for heart rate over 110).  09/22/15 09/21/16 Yes Governor Rooks, MD  Multiple Vitamins-Minerals (CENTRUM SILVER PO) Take 1 tablet by mouth daily.   Yes Historical Provider, MD  simvastatin (ZOCOR) 20 MG tablet Take 20 mg by mouth every evening.    Yes Historical Provider, MD  sotalol (BETAPACE) 80 MG tablet Take 1 tablet (80 mg total) by mouth every 12 (twelve) hours. 03/13/16  Yes Houston Siren, MD    Allergies Morphine and related  Family History  Problem Relation Age of  Onset  . Hypertension Other     Social History Social History  Substance Use Topics  . Smoking status: Never Smoker  . Smokeless tobacco: Never Used  . Alcohol use No    Review of Systems  Constitutional: No fever/chills. Eyes: No visual changes. ENT: No sore throat. Cardiovascular: Positive for low heart rate. Denies chest pain. Respiratory: Denies shortness of breath. Gastrointestinal: No abdominal pain.  No nausea, no vomiting.  No diarrhea.  No constipation. Genitourinary: Negative for  dysuria. Musculoskeletal: Negative for back pain. Skin: Negative for rash. Neurological: Positive for lightheadedness. Negative for headaches, focal weakness or numbness.  10-point ROS otherwise negative.  ____________________________________________   PHYSICAL EXAM:  VITAL SIGNS: ED Triage Vitals  Enc Vitals Group     BP 05/14/16 2242 (!) 158/82     Pulse Rate 05/14/16 2242 (!) 29     Resp 05/14/16 2242 12     Temp --      Temp src --      SpO2 05/14/16 2242 99 %     Weight 05/14/16 2308 141 lb (64 kg)     Height 05/14/16 2308 5\' 2"  (1.575 m)     Head Circumference --      Peak Flow --      Pain Score 05/14/16 2243 0     Pain Loc --      Pain Edu? --      Excl. in GC? --     Constitutional: Alert and oriented. Well appearing and in mild acute distress. Eyes: Conjunctivae are normal. PERRL. EOMI. Head: Atraumatic. Nose: No congestion/rhinnorhea. Mouth/Throat: Mucous membranes are moist.  Oropharynx non-erythematous. Neck: No stridor.   Cardiovascular: Bradycardic rate, regular rhythm. Grossly normal heart sounds.  Good peripheral circulation. Respiratory: Normal respiratory effort.  No retractions. Lungs CTAB. Gastrointestinal: Soft and nontender. No distention. No abdominal bruits. No CVA tenderness. Musculoskeletal: No lower extremity tenderness nor edema.  No joint effusions. Neurologic:  Normal speech and language. No gross focal neurologic deficits are appreciated.  Skin:  Skin is warm, dry and intact. No rash noted. Psychiatric: Mood and affect are normal. Speech and behavior are normal.  ____________________________________________   LABS (all labs ordered are listed, but only abnormal results are displayed)  Labs Reviewed  COMPREHENSIVE METABOLIC PANEL - Abnormal; Notable for the following:       Result Value   Glucose, Bld 156 (*)    BUN 24 (*)    Alkaline Phosphatase 180 (*)    GFR calc non Af Amer 48 (*)    GFR calc Af Amer 56 (*)    All other  components within normal limits  GLUCOSE, CAPILLARY - Abnormal; Notable for the following:    Glucose-Capillary 126 (*)    All other components within normal limits  CBC WITH DIFFERENTIAL/PLATELET  TROPONIN I   ____________________________________________  EKG  ED ECG REPORT I, Davion Meara J, the attending physician, personally viewed and interpreted this ECG.   Date: 05/14/2016  EKG Time: 2247  Rate: 35  Rhythm: sinus bradycardia  Axis: Normal  Intervals:none  ST&T Change: Nonspecific  ____________________________________________  RADIOLOGY  Chest x-ray (viewed by me, interpreted per Dr. Manus GunningEhinger): 1. Stable cardiomegaly. No acute abnormality.  2. Thoracic aortic atherosclerosis.   ____________________________________________   PROCEDURES  Procedure(s) performed: None  Procedures  Critical Care performed:   CRITICAL CARE Performed by: Irean HongSUNG,Bethann Qualley J   Total critical care time: 30 minutes  Critical care time was exclusive of separately billable procedures and treating other  patients.  Critical care was necessary to treat or prevent imminent or life-threatening deterioration.  Critical care was time spent personally by me on the following activities: development of treatment plan with patient and/or surrogate as well as nursing, discussions with consultants, evaluation of patient's response to treatment, examination of patient, obtaining history from patient or surrogate, ordering and performing treatments and interventions, ordering and review of laboratory studies, ordering and review of radiographic studies, pulse oximetry and re-evaluation of patient's condition.  ____________________________________________   INITIAL IMPRESSION / ASSESSMENT AND PLAN / ED COURSE  Pertinent labs & imaging results that were available during my care of the patient were reviewed by me and considered in my medical decision making (see chart for details).  82 year old female with  a history of atrial fibrillation on metoprolol and Eliquis who presents with an unintentional overdose of metoprolol causing symptomatic bradycardia. Patient had a heart rate of 35 with normal blood pressure on her arrival to the emergency department. She was given atropine with good effect. Currently her heart rate is 58 with a blood pressure of 148/77. Discussed with hospitalist to evaluate patient in the emergency department for admission.  Clinical Course  Comment By Time  Patient had transient episode of bradycardia to a heart rate of 20s. She had a several second pause noted on her monitor strip. She maintained her blood pressure but felt generally unwell. Heart rate rebounded to the 60s without intervention. IV glucagon given with good symptomatic effect. Irean HongJade J Carline Dura, MD 08/24 0020     ____________________________________________   FINAL CLINICAL IMPRESSION(S) / ED DIAGNOSES  Final diagnoses:  Overdose of cardiac medication, accidental or unintentional, initial encounter  Symptomatic bradycardia  Paroxysmal atrial fibrillation (HCC)      NEW MEDICATIONS STARTED DURING THIS VISIT:  Current Discharge Medication List       Note:  This document was prepared using Dragon voice recognition software and may include unintentional dictation errors.    Irean HongJade J Deshondra Worst, MD 05/15/16 [id number]

## 2016-05-15 DIAGNOSIS — I10 Essential (primary) hypertension: Secondary | ICD-10-CM | POA: Diagnosis present

## 2016-05-15 DIAGNOSIS — I480 Paroxysmal atrial fibrillation: Secondary | ICD-10-CM | POA: Diagnosis not present

## 2016-05-15 DIAGNOSIS — R001 Bradycardia, unspecified: Secondary | ICD-10-CM | POA: Diagnosis present

## 2016-05-15 DIAGNOSIS — E785 Hyperlipidemia, unspecified: Secondary | ICD-10-CM | POA: Diagnosis present

## 2016-05-15 LAB — GLUCOSE, CAPILLARY: GLUCOSE-CAPILLARY: 126 mg/dL — AB (ref 65–99)

## 2016-05-15 MED ORDER — ACETAMINOPHEN 650 MG RE SUPP: 650.0000 mg | Freq: Four times a day (QID) | RECTAL | Status: DC | PRN

## 2016-05-15 MED ORDER — SODIUM CHLORIDE 0.9% FLUSH
3.0000 mL | Freq: Two times a day (BID) | INTRAVENOUS | Status: DC
  Administered 2016-05-15 (×2): 3 mL via INTRAVENOUS

## 2016-05-15 MED ORDER — APIXABAN 5 MG PO TABS
5.0000 mg | ORAL_TABLET | Freq: Two times a day (BID) | ORAL | Status: DC
  Administered 2016-05-15 (×2): 5 mg via ORAL
  Filled 2016-05-15: qty 1

## 2016-05-15 MED ORDER — ONDANSETRON HCL 4 MG PO TABS: 4.0000 mg | ORAL_TABLET | Freq: Four times a day (QID) | ORAL | Status: DC | PRN

## 2016-05-15 MED ORDER — SIMVASTATIN 20 MG PO TABS: 20.0000 mg | ORAL_TABLET | Freq: Every evening | ORAL | Status: DC

## 2016-05-15 MED ORDER — PROCHLORPERAZINE EDISYLATE 5 MG/ML IJ SOLN
10.0000 mg | Freq: Four times a day (QID) | INTRAMUSCULAR | Status: DC | PRN
  Administered 2016-05-15: 10 mg via INTRAVENOUS
  Filled 2016-05-15 (×2): qty 2

## 2016-05-15 MED ORDER — GLUCAGON HCL (RDNA) 1 MG IJ SOLR
3.0000 mg | Freq: Once | INTRAMUSCULAR | Status: AC
  Administered 2016-05-15: 3 mg via INTRAVENOUS

## 2016-05-15 MED ORDER — GLUCAGON HCL RDNA (DIAGNOSTIC) 1 MG IJ SOLR
INTRAMUSCULAR | Status: AC
  Administered 2016-05-15: 3 mg via INTRAVENOUS
  Filled 2016-05-15: qty 3

## 2016-05-15 MED ORDER — ONDANSETRON HCL 4 MG/2ML IJ SOLN
4.0000 mg | Freq: Four times a day (QID) | INTRAMUSCULAR | Status: DC | PRN
  Administered 2016-05-15: 4 mg via INTRAVENOUS
  Filled 2016-05-15: qty 2

## 2016-05-15 MED ORDER — GLUCAGON HCL RDNA (DIAGNOSTIC) 1 MG IJ SOLR
3.0000 mg/h | INTRAVENOUS | Status: DC
  Administered 2016-05-15 (×2): 3 mg/h via INTRAVENOUS
  Filled 2016-05-15 (×3): qty 5

## 2016-05-15 MED ORDER — ACETAMINOPHEN 325 MG PO TABS: 650.0000 mg | ORAL_TABLET | Freq: Four times a day (QID) | ORAL | Status: DC | PRN

## 2016-05-15 NOTE — ED Notes (Signed)
Pt reports feeling better, states, "I'm just cold" then laughs. Helped pull blankets up around pt's shoulders. This RN explained to pt that she had been assigned to a room and that I would be calling report. Asked if I could do anything for her, pt declined needing anything. Pt's HR in the 50s with frequent PVCs, pt declines any chest pain or SOB. Pt's skin pink, warm and dry, respirations even and unlabored.

## 2016-05-15 NOTE — Progress Notes (Signed)
A&O. Up with standby assist. Admitted with bradycardia. No complaints. VSS at this time. On RA.

## 2016-05-15 NOTE — Care Management Obs Status (Signed)
MEDICARE OBSERVATION STATUS NOTIFICATION   Patient Details  Name: Colleen Carson MRN: [id number] Date of Birth: 10-Feb-1925   Medicare Observation Status Notification Given:  Yes    Eber HongGreene, Corin Tilly R, RN 05/15/2016, 11:27 AM

## 2016-05-15 NOTE — ED Notes (Signed)
Report given to floor RN. Pt taken to floor via stretcher. Vital signs stable prior to transport.  

## 2016-05-15 NOTE — ED Notes (Signed)
This RN in room hanging fluids and doing repeat EKG. Right after hanging fluids and doing some documenting, just prior to leaving room, pt's HR dropped into 20s and 30s, then came back up into 50s and 60s. Pt initially reported not really feeling any different, then started to look pale and closed her eyes and started to take some deep breaths. This RN asked pt if she was ok. Pt stated, "I just feel different." Pt looking restless on stretcher, taking sighing breaths occasionally. Pt's HR in the 40s and 50s, then had long pause. MD called to bedside. Pt continuing to report that she doesn't feel right but can't pinpoint what's wrong, but does state, "I feel like I can't get my breath." Pt's O2 sats 100% on 3L. Orders received for glucagon.

## 2016-05-15 NOTE — Progress Notes (Signed)
Patient accidentally pulled the IV out of her left hand. Glucagon was continuously infusing. Dr. Sherryll BurgerShah paged and informed that patient's heart rate has been in the 60's and she is asymptomatic at this point from her admission complaint. MD stated not necessary to continue glucagon anymore and okay to leave IV out since patient may discharge. After speaking to MD, discovered patient has a second IV site in her right AC. Will leave IV in, but discontinue glucagon drip.

## 2016-05-15 NOTE — H&P (Signed)
[facility] - [place] at [facility]   PATIENT NAME: Colleen MoundHilda Brinkman    MR#:  [id number]  DATE OF BIRTH:  02-21-1925  DATE OF ADMISSION:  05/14/2016  PRIMARY CARE PHYSICIAN: [name], Lavada MesiMARC E, MD   REQUESTING/REFERRING PHYSICIAN: Dolores FrameSung, MD  CHIEF COMPLAINT:   Chief Complaint  Patient presents with  . Atrial Fibrillation  . Bradycardia    HISTORY OF PRESENT ILLNESS:  Colleen MoundHilda Votta  is a 82 y.o. female who presents with Symptomatic bradycardia. Patient has a history of A. fib controlled at home with sotalol and Lopressor. Lopressor as prescribed so that she can take an extra dose when she needs to for heart rate control. She states that her heart rate was fast earlier today, so throughout the day she took a few extra half doses of her Lopressor. She reports that her total dose of Lopressor today was probably between 87.5 and 100 mg. In the afternoon she developed bradycardia with symptoms. She came to the ED for evaluation. She is initially given atropine for heart rate in the 20s and 30s with good response. She then had another episode in the ED where her blood pressure dropped to the 20s and she had a significant several second cause with discomfort. She was given glucagon and responded well with heart rate came back up to the 60s. Hospitalists were called for admission. Of note, patient states that she is scheduled for pacemaker insertion at the end of September.  PAST MEDICAL HISTORY:   Past Medical History:  Diagnosis Date  . Arthritis   . Atrial fibrillation (HCC)   . Dysrhythmia    Afib  . Hypercholesteremia   . Hypertension   . Lymphedema    lower extremities  . S/P total hip arthroplasty 01/25/2015  . Shingles   . Stroke [facility](HCC)    noted ischemia on CT scan    PAST SURGICAL HISTORY:   Past Surgical History:  Procedure Laterality Date  . JOINT REPLACEMENT  01/23/15   Left THR Dr. Rosita KeaMenz   . TOTAL HIP ARTHROPLASTY Left 01/23/2015   Procedure: TOTAL HIP  ARTHROPLASTY ANTERIOR APPROACH;  Surgeon: Kennedy BuckerMichael Menz, MD;  Location: [facility] ORS;  Service: Orthopedics;  Laterality: Left;    SOCIAL HISTORY:   Social History  Substance Use Topics  . Smoking status: Never Smoker  . Smokeless tobacco: Never Used  . Alcohol use No    FAMILY HISTORY:   Family History  Problem Relation Age of Onset  . Hypertension Other     DRUG ALLERGIES:   Allergies  Allergen Reactions  . Morphine And Related Nausea And Vomiting    MEDICATIONS AT HOME:   Prior to Admission medications   Medication Sig Start Date End Date Taking? Authorizing Provider  apixaban (ELIQUIS) 5 MG TABS tablet Take 5 mg by mouth 2 (two) times daily.   Yes Historical Provider, MD  fluticasone (FLONASE) 50 MCG/ACT nasal spray Place 2 sprays into both nostrils daily as needed for rhinitis.    Yes Historical Provider, MD  hydrochlorothiazide (HYDRODIURIL) 12.5 MG tablet Take 12.5 mg by mouth daily.    Yes Historical Provider, MD  methimazole (TAPAZOLE) 5 MG tablet Take 1 tablet (5 mg total) by mouth daily. 03/13/16  Yes Houston SirenVivek J Sainani, MD  metoprolol tartrate (LOPRESSOR) 25 MG tablet One tab every 12 hours prn heart rate over 110 Patient taking differently: Take 25 mg by mouth every 12 (twelve) hours as needed (for heart rate over 110).  09/22/15 09/21/16 Yes  Governor Rooksebecca Lord, MD  Multiple Vitamins-Minerals (CENTRUM SILVER PO) Take 1 tablet by mouth daily.   Yes Historical Provider, MD  simvastatin (ZOCOR) 20 MG tablet Take 20 mg by mouth every evening.    Yes Historical Provider, MD  sotalol (BETAPACE) 80 MG tablet Take 1 tablet (80 mg total) by mouth every 12 (twelve) hours. 03/13/16  Yes Houston SirenVivek J Sainani, MD    REVIEW OF SYSTEMS:  Review of Systems  Constitutional: Negative for chills, fever, malaise/fatigue and weight loss.  HENT: Negative for ear pain, hearing loss and tinnitus.   Eyes: Negative for blurred vision, double vision, pain and redness.  Respiratory: Negative for cough,  hemoptysis and shortness of breath.   Cardiovascular: Negative for chest pain, palpitations, orthopnea and leg swelling.       Bradycardia  Gastrointestinal: Negative for abdominal pain, constipation, diarrhea, nausea and vomiting.  Genitourinary: Negative for dysuria, frequency and hematuria.  Musculoskeletal: Negative for back pain, joint pain and neck pain.  Skin:       No acne, rash, or lesions  Neurological: Negative for dizziness, tremors, focal weakness and weakness.  Endo/Heme/Allergies: Negative for polydipsia. Does not bruise/bleed easily.  Psychiatric/Behavioral: Negative for depression. The patient is not nervous/anxious and does not have insomnia.      VITAL SIGNS:   Vitals:   05/14/16 2350 05/15/16 0000 05/15/16 0010 05/15/16 0020  BP: (!) 99/58 (!) 148/77 (!) 149/80 (!) 139/100  Pulse: (!) 56 (!) 57 (!) 58 60  Resp: (!) 24 15 (!) 30 (!) 26  SpO2: 100% 100% 100% 100%  Weight:      Height:       Wt Readings from Last 3 Encounters:  05/14/16 64 kg (141 lb)  03/18/16 67.1 kg (148 lb)  03/11/16 70 kg (154 lb 4.8 oz)    PHYSICAL EXAMINATION:  Physical Exam  Vitals reviewed. Constitutional: She is oriented to person, place, and time. She appears well-developed and well-nourished. No distress.  HENT:  Head: Normocephalic and atraumatic.  Mouth/Throat: Oropharynx is clear and moist.  Eyes: Conjunctivae and EOM are normal. Pupils are equal, round, and reactive to light. No scleral icterus.  Neck: Normal range of motion. Neck supple. No JVD present. No thyromegaly present.  Cardiovascular: Regular rhythm and intact distal pulses.  Exam reveals no gallop and no friction rub.   No murmur heard. Bradycardic  Respiratory: Effort normal and breath sounds normal. No respiratory distress. She has no wheezes. She has no rales.  GI: Soft. Bowel sounds are normal. She exhibits no distension. There is no tenderness.  Musculoskeletal: Normal range of motion. She exhibits no edema.   No arthritis, no gout  Lymphadenopathy:    She has no cervical adenopathy.  Neurological: She is alert and oriented to person, place, and time. No cranial nerve deficit.  No dysarthria, no aphasia  Skin: Skin is warm and dry. No rash noted. No erythema.  Psychiatric: She has a normal mood and affect. Her behavior is normal. Judgment and thought content normal.    LABORATORY PANEL:   CBC  Recent Labs Lab 05/14/16 2251  WBC 8.6  HGB 14.9  HCT 44.7  PLT 207   ------------------------------------------------------------------------------------------------------------------  Chemistries   Recent Labs Lab 05/14/16 2251  NA 141  K 3.7  CL 104  CO2 27  GLUCOSE 156*  BUN 24*  CREATININE 1.00  CALCIUM 9.6  AST 31  ALT 16  ALKPHOS 180*  BILITOT 0.4   ------------------------------------------------------------------------------------------------------------------  Cardiac Enzymes  Recent Labs Lab  05/14/16 2251  TROPONINI <0.03   ------------------------------------------------------------------------------------------------------------------  RADIOLOGY:  Dg Chest Port 1 View  Result Date: 05/14/2016 CLINICAL DATA:  Bradycardia. EXAM: PORTABLE CHEST 1 VIEW COMPARISON:  03/18/2016 FINDINGS: Stable cardiomegaly. Unchanged atherosclerosis and tortuosity of the thoracic aorta. Normal pulmonary vascularity. No focal airspace disease, pleural effusion or pneumothorax. Overlying monitoring devices partially obscure evaluation of the mid chest. No acute osseous abnormality is seen. IMPRESSION: 1. Stable cardiomegaly.  No acute abnormality. 2. Thoracic aortic atherosclerosis. Electronically Signed   By: Rubye Oaks M.D.   On: 05/14/2016 23:36    EKG:   Orders placed or performed during the hospital encounter of 05/14/16  . EKG 12-Lead  . EKG 12-Lead  . EKG 12-Lead  . EKG 12-Lead    IMPRESSION AND PLAN:  Principal Problem:   Symptomatic bradycardia - suspect  possible beta blocker effect here. Patient responded initially to atropine and glucagon. We will admit her to telemetry with glucagon infusion for her bradycardia. Cardiology consult Active Problems:   Atrial fibrillation (HCC) - currently in sinus rhythm, continue anticoagulation, other treatment including cardiology consult as above   HTN (hypertension) - currently stable, continue home meds except for those might affect her heart rate   HLD (hyperlipidemia) - continue home meds  All the records are reviewed and case discussed with ED provider. Management plans discussed with the patient and/or family.  DVT PROPHYLAXIS: Systemic anticoagulation  GI PROPHYLAXIS: None  ADMISSION STATUS: Observation  CODE STATUS: DNR Code Status History    Date Active Date Inactive Code Status Order ID Comments User Context   03/11/2016  8:39 AM 03/13/2016  4:09 PM DNR [id number]  Wyatt Haste, MD ED   03/11/2016  8:39 AM 03/11/2016  8:39 AM Full Code [id number]  Wyatt Haste, MD ED   05/06/2015  3:01 AM 05/07/2015  3:02 PM DNR [id number]  Arnaldo Natal, MD Inpatient   05/06/2015  1:57 AM 05/06/2015  3:01 AM Full Code [id number]  Arnaldo Natal, MD ED   01/23/2015 11:21 AM 01/26/2015  4:25 PM Full Code [id number]  Kennedy Bucker, MD Inpatient    Questions for Most Recent Historical Code Status (Order [id number])    Question Answer Comment   In the event of cardiac or respiratory ARREST Do not call a "code blue"    In the event of cardiac or respiratory ARREST Do not perform Intubation, CPR, defibrillation or ACLS    In the event of cardiac or respiratory ARREST Use medication by any route, position, wound care, and other measures to relive pain and suffering. May use oxygen, suction and manual treatment of airway obstruction as needed for comfort.         Advance Directive Documentation   Flowsheet Row Most Recent Value  Type of Advance Directive  Living will  Pre-existing out of facility DNR order (yellow  form or pink MOST form)  No data  "MOST" Form in Place?  No data      TOTAL TIME TAKING CARE OF THIS PATIENT: 40 minutes.    Talbert Trembath FIELDING 05/15/2016, 12:45 AM  [facility]  Office  [phone]  CC: Primary care physician; Rozanna Box, MD

## 2016-05-15 NOTE — Progress Notes (Signed)
Discharge instructions given. IV and tele removed. Education given on afib, bradycardia, and metoprolol. Reviewed medications with patient and instructed to only take metoprolol if heart rate goes higher than 110. Dr. Lady GaryFath listed for consult on the patient, but Dr. Sherryll BurgerShah stated he does not need to do a full consultation on the patient since they have already decided to discontinue metoprolol. [facility] Fath did come in and speak with the patient and has been in contact with Dr. Maisie Fushomas about patient's pacemaker placement scheduled at the end of September. Questions were answered and patient verbalized understanding. Portable DNR signed by Dr. Sherryll BurgerShah and given to patient.

## 2016-05-15 NOTE — Discharge Instructions (Signed)
Bradycardia  Bradycardia is a slower-than-normal heart rate. A normal resting heart rate for an adult ranges from 60 to 100 beats per minute. With bradycardia, the resting heart rate is less than 60 beats per minute.  Bradycardia is a problem if your heart cannot pump enough oxygen-rich blood through your body. Bradycardia is not a problem for everyone. For some healthy adults, a slow resting heart rate is normal.   CAUSES   Bradycardia may be caused by:  · A problem with the heart's electrical system, such as heart block.  · A problem with the heart's natural pacemaker (sinus node).  · Heart disease, damage, or infection.  · Certain medicines that treat heart conditions.  · Certain conditions, such as hypothyroidism and obstructive sleep apnea.  RISK FACTORS   Risk factors include:  · Being 65 or older.  · Having high blood pressure (hypertension), high cholesterol (hyperlipidemia), or diabetes.  · Drinking heavily, using tobacco products, or using drugs.  · Being stressed.  SIGNS AND SYMPTOMS   Signs and symptoms include:  · Light-headedness.  · Fainting or near fainting.  · Fatigue and weakness.  · Shortness of breath.  · Chest pain (angina).  · Drowsiness.  · Confusion.  · Dizziness.  DIAGNOSIS   Diagnosis of bradycardia may include:  · A physical exam.  · An electrocardiogram (ECG).  · Blood tests.  TREATMENT   Treatment for bradycardia may include:  · Treatment of an underlying condition.  · Pacemaker placement. A pacemaker is a small, battery-powered device that is placed under the skin and is programmed to sense your heartbeats. If your heart rate is lower than the programmed rate, the pacemaker will pace your heart.  · Changing your medicines or dosages.  HOME CARE INSTRUCTIONS  · Take medicines only as directed by your health care provider.  · Manage any health conditions that contribute to bradycardia as directed by your health care provider.  · Follow a heart-healthy diet. A dietitian can help educate  you on healthy food options and changes.  · Follow an exercise program approved by your health care provider.  · Maintain a healthy weight. Lose weight as approved by your health care provider.  · Do not use tobacco products, including cigarettes, chewing tobacco, or electronic cigarettes. If you need help quitting, ask your health care provider.  · Do not use illegal drugs.  · Limit alcohol intake to no more than 1 drink per day for nonpregnant women and 2 drinks per day for men. One drink equals 12 ounces of beer, 5 ounces of wine, or 1½ ounces of hard liquor.  · Keep all follow-up visits as directed by your health care provider. This is important.  SEEK MEDICAL CARE IF:  · You feel light-headed or dizzy.  · You almost faint.  · You feel weak or are easily fatigued during physical activity.  · You experience confusion or have memory problems.  SEEK IMMEDIATE MEDICAL CARE IF:   · You faint.  · You have an irregular heartbeat.  · You have chest pain.  · You have trouble breathing.  MAKE SURE YOU:   · Understand these instructions.  · Will watch your condition.  · Will get help right away if you are not doing well or get worse.     This information is not intended to replace advice given to you by your health care provider. Make sure you discuss any questions you have with your health care provider.       Document Released: 05/31/2002 Document Revised: 09/29/2014 Document Reviewed: 12/14/2013  Elsevier Interactive Patient Education ©2016 Elsevier Inc.

## 2016-05-15 NOTE — Care Management (Signed)
Patient presents from home.  Placed in observation due to syncope which most likely was due to effects of beta blocker.  Attending to discharge home today.  Patient was already scheduled for pacemaker in September.  Independent in all adls, denies issues accessing medical care, obtaining medications or with transportation.  Current with her PCP.  No discharge needs identified at present by care manager or members of care team

## 2016-05-18 NOTE — Discharge Summary (Signed)
[facility] - [place] at [facility]   PATIENT NAME: Colleen Carson    MR#:  [id number]  DATE OF BIRTH:  [birth year]  DATE OF ADMISSION:  05/14/2016   ADMITTING PHYSICIAN: Oralia Manis, MD  DATE OF DISCHARGE: 05/15/2016 12:19 PM  PRIMARY CARE PHYSICIAN: [name], [name], MD   ADMISSION DIAGNOSIS:  Paroxysmal atrial fibrillation (HCC) [I48.0] Symptomatic bradycardia [R00.1] Overdose of cardiac medication, accidental or unintentional, initial encounter [T46.2X1A] DISCHARGE DIAGNOSIS:  Principal Problem:   Symptomatic bradycardia Active Problems:   Atrial fibrillation (HCC)   HTN (hypertension)   HLD (hyperlipidemia)  SECONDARY DIAGNOSIS:   Past Medical History:  Diagnosis Date  . Arthritis   . Atrial fibrillation (HCC)   . Dysrhythmia    Afib  . Hypercholesteremia   . Hypertension   . Lymphedema    lower extremities  . S/P total hip arthroplasty 01/25/2015  . Shingles   . Stroke [facility])    noted ischemia on CT scan   HOSPITAL COURSE:  82 y.o. female who presents with Symptomatic bradycardia. Patient has a history of A. fib controlled at home with sotalol and Lopressor.  * Symptomatic bradycardia - Likely due to beta blocker effect. Patient responded initially to atropine and glucagon. Her HR remained in 60s off metoprolol.   * Atrial fibrillation (HCC) - remained in sinus rhythm, continue anticoagulation  She was instructed to stay off metoprolol and use it 12.5 mg (1/2 dose) as need for HR above 110. If HR Remains low, she was also instructed to hold sotalol. She was also instructed to make earlier appt with her EP physician at [facility] as she is scheduled to have ablation/pacer there at the end of September. DISCHARGE CONDITIONS:  STABLE CONSULTS OBTAINED:  Treatment Team:  Dalia Heading, MD DRUG ALLERGIES:   Allergies  Allergen Reactions  . Morphine And Related Nausea And Vomiting   DISCHARGE MEDICATIONS:     Medication List    STOP  taking these medications   metoprolol tartrate 25 MG tablet Commonly known as:  LOPRESSOR     TAKE these medications   CENTRUM SILVER PO Take 1 tablet by mouth daily.   ELIQUIS 5 MG Tabs tablet Generic drug:  apixaban Take 5 mg by mouth 2 (two) times daily.   fluticasone 50 MCG/ACT nasal spray Commonly known as:  FLONASE Place 2 sprays into both nostrils daily as needed for rhinitis.   hydrochlorothiazide 12.5 MG tablet Commonly known as:  HYDRODIURIL Take 12.5 mg by mouth daily.   methimazole 5 MG tablet Commonly known as:  TAPAZOLE Take 1 tablet (5 mg total) by mouth daily.   simvastatin 20 MG tablet Commonly known as:  ZOCOR Take 20 mg by mouth every evening.   sotalol 80 MG tablet Commonly known as:  BETAPACE Take 1 tablet (80 mg total) by mouth every 12 (twelve) hours.        DISCHARGE INSTRUCTIONS:   DIET:  Cardiac diet DISCHARGE CONDITION:  Good ACTIVITY:  Activity as tolerated OXYGEN:  Home Oxygen: No.  Oxygen Delivery: room air DISCHARGE LOCATION:  home   If you experience worsening of your admission symptoms, develop shortness of breath, life threatening emergency, suicidal or homicidal thoughts you must seek medical attention immediately by calling 911 or calling your MD immediately  if symptoms less severe.  You Must read complete instructions/literature along with all the possible adverse reactions/side effects for all the Medicines you take and that have been prescribed to you. Take any  new Medicines after you have completely understood and accpet all the possible adverse reactions/side effects.   Please note  You were cared for by a hospitalist during your hospital stay. If you have any questions about your discharge medications or the care you received while you were in the hospital after you are discharged, you can call the unit and asked to speak with the hospitalist on call if the hospitalist that took care of you is not available. Once you  are discharged, your primary care physician will handle any further medical issues. Please note that NO REFILLS for any discharge medications will be authorized once you are discharged, as it is imperative that you return to your primary care physician (or establish a relationship with a primary care physician if you do not have one) for your aftercare needs so that they can reassess your need for medications and monitor your lab values.    On the day of Discharge:  VITAL SIGNS:  Blood pressure (!) 147/57, pulse (!) 56, temperature 97.6 F (36.4 C), temperature source Oral, resp. rate 20, height 5\' 2"  (1.575 m), weight 66.4 kg (146 lb 6.4 oz), SpO2 96 %. PHYSICAL EXAMINATION:  GENERAL:  82 y.o.-year-old patient lying in the bed with no acute distress.  EYES: Pupils equal, round, reactive to light and accommodation. No scleral icterus. Extraocular muscles intact.  HEENT: Head atraumatic, normocephalic. Oropharynx and nasopharynx clear.  NECK:  Supple, no jugular venous distention. No thyroid enlargement, no tenderness.  LUNGS: Normal breath sounds bilaterally, no wheezing, rales,rhonchi or crepitation. No use of accessory muscles of respiration.  CARDIOVASCULAR: S1, S2 normal. No murmurs, rubs, or gallops.  ABDOMEN: Soft, non-tender, non-distended. Bowel sounds present. No organomegaly or mass.  EXTREMITIES: No pedal edema, cyanosis, or clubbing.  NEUROLOGIC: Cranial nerves II through XII are intact. Muscle strength 5/5 in all extremities. Sensation intact. Gait not checked.  PSYCHIATRIC: The patient is alert and oriented x 3.  SKIN: No obvious rash, lesion, or ulcer.  DATA REVIEW:   CBC  Recent Labs Lab 05/14/16 2251  WBC 8.6  HGB 14.9  HCT 44.7  PLT 207    Chemistries   Recent Labs Lab 05/14/16 2251  NA 141  K 3.7  CL 104  CO2 27  GLUCOSE 156*  BUN 24*  CREATININE 1.00  CALCIUM 9.6  AST 31  ALT 16  ALKPHOS 180*  BILITOT 0.4    Follow-up Information    [name],  [name], MD. Schedule an appointment as soon as possible for a visit in 2 week(s).   Specialty:  Family Medicine Why:  Monday, September 12th at 415pm, ccs Contact information: [address] [facility] [place] [address] [phone]        Dalia HeadingFATH,KENNETH A., MD. Schedule an appointment as soon as possible for a visit in 1 week(s).   Specialty:  Cardiology Why:  Thursday, September 7th at 845am, ccs Contact information: [address] [place] [address] [phone]           Management plans discussed with the patient, family and they are in agreement.  CODE STATUS: DNR  TOTAL TIME TAKING CARE OF THIS PATIENT: 45 minutes.    [facility] Hospital[facility]H, Meosha Castanon M.D on 05/18/2016 at 1:53 PM  Between 7am to 6pm - Pager - [phone]  After 6pm go to [email or web address] - Social research officer, governmentpassword [email or web address]  [employer] [place] [facility]  Office  [phone]  CC: Primary care physician; [name], Lavada MesiMARC E, MD  Note: This dictation was prepared with Dragon dictation along with smaller phrase technology. Any transcriptional errors that result from this process are unintentional.

## 2016-06-05 ENCOUNTER — Encounter: Payer: Self-pay | Admitting: Emergency Medicine

## 2016-06-05 ENCOUNTER — Emergency Department: Payer: Medicare Other

## 2016-06-05 ENCOUNTER — Emergency Department
Admission: EM | Admit: 2016-06-05 | Discharge: 2016-06-05 | Disposition: A | Discharge status: Home / Self Care | Payer: Medicare Other | Attending: Emergency Medicine | Admitting: Emergency Medicine

## 2016-06-05 DIAGNOSIS — R202 Paresthesia of skin: Secondary | ICD-10-CM | POA: Diagnosis not present

## 2016-06-05 DIAGNOSIS — I10 Essential (primary) hypertension: Secondary | ICD-10-CM | POA: Diagnosis present

## 2016-06-05 DIAGNOSIS — Z79899 Other long term (current) drug therapy: Secondary | ICD-10-CM | POA: Insufficient documentation

## 2016-06-05 LAB — TROPONIN I

## 2016-06-05 LAB — CBC
HCT: 41.4 % (ref 35.0–47.0)
HEMOGLOBIN: 14.1 g/dL (ref 12.0–16.0)
MCH: 29.7 pg (ref 26.0–34.0)
MCHC: 34 g/dL (ref 32.0–36.0)
MCV: 87.5 fL (ref 80.0–100.0)
PLATELETS: 183 10*3/uL (ref 150–440)
RBC: 4.74 MIL/uL (ref 3.80–5.20)
RDW: 15 % — ABNORMAL HIGH (ref 11.5–14.5)
WBC: 5.9 10*3/uL (ref 3.6–11.0)

## 2016-06-05 LAB — BASIC METABOLIC PANEL
ANION GAP: 6 (ref 5–15)
BUN: 20 mg/dL (ref 6–20)
CALCIUM: 9.3 mg/dL (ref 8.9–10.3)
CO2: 31 mmol/L (ref 22–32)
CREATININE: 0.83 mg/dL (ref 0.44–1.00)
Chloride: 101 mmol/L (ref 101–111)
Glucose, Bld: 132 mg/dL — ABNORMAL HIGH (ref 65–99)
Potassium: 3.7 mmol/L (ref 3.5–5.1)
SODIUM: 138 mmol/L (ref 135–145)

## 2016-06-05 NOTE — ED Provider Notes (Signed)
[facility] Emergency Department Provider Note  Time seen: 8:18 PM  I have reviewed the triage vital signs and the nursing notes.   HISTORY  Chief Complaint Hypertension and Numbness    HPI Colleen Carson is a 82 y.o. female With a past medical history of arthritis, atrial fibrillation, hypertension, hyperlipidemia, CVA who presents to the emergency department with high blood pressure, and right arm numbness. According to the patient her blood pressures been elevated all day today which has been concerning to the patient. She has checked it multiple times. She also states over the past several days she's had a tingling/numbness sensation in the right upper extremity. Denies any facial involvement contrary to triage note. Denies any leg involvement. Denies any weakness. Denies any confusion, slurred speech. The patient does state today she has had several episodes of feeling like she might pass out. Denies any chest pain at any point. Denies any headache.  Past Medical History:  Diagnosis Date  . Arthritis   . Atrial fibrillation (HCC)   . Dysrhythmia    Afib  . Hypercholesteremia   . Hypertension   . Lymphedema    lower extremities  . S/P total hip arthroplasty 01/25/2015  . Shingles   . Stroke [facility])    noted ischemia on CT scan    Patient Active Problem List   Diagnosis Date Noted  . Symptomatic bradycardia 05/15/2016  . HTN (hypertension) 05/15/2016  . HLD (hyperlipidemia) 05/15/2016  . Atrial fibrillation (HCC) 03/11/2016  . AKI (acute kidney injury) (HCC) 05/06/2015  . S/P total hip arthroplasty 01/25/2015  . Primary osteoarthritis of left hip 01/23/2015    Past Surgical History:  Procedure Laterality Date  . JOINT REPLACEMENT  01/23/15   Left THR Dr. Rosita Kea   . TOTAL HIP ARTHROPLASTY Left 01/23/2015   Procedure: TOTAL HIP ARTHROPLASTY ANTERIOR APPROACH;  Surgeon: Kennedy Bucker, MD;  Location: [facility] ORS;  Service: Orthopedics;  Laterality: Left;     Prior to Admission medications   Medication Sig Start Date End Date Taking? Authorizing Provider  apixaban (ELIQUIS) 5 MG TABS tablet Take 5 mg by mouth 2 (two) times daily.    Historical Provider, MD  fluticasone (FLONASE) 50 MCG/ACT nasal spray Place 2 sprays into both nostrils daily as needed for rhinitis.     Historical Provider, MD  hydrochlorothiazide (HYDRODIURIL) 12.5 MG tablet Take 12.5 mg by mouth daily.     Historical Provider, MD  methimazole (TAPAZOLE) 5 MG tablet Take 1 tablet (5 mg total) by mouth daily. 03/13/16   Houston Siren, MD  Multiple Vitamins-Minerals (CENTRUM SILVER PO) Take 1 tablet by mouth daily.    Historical Provider, MD  simvastatin (ZOCOR) 20 MG tablet Take 20 mg by mouth every evening.     Historical Provider, MD  sotalol (BETAPACE) 80 MG tablet Take 1 tablet (80 mg total) by mouth every 12 (twelve) hours. 03/13/16   Houston Siren, MD    Allergies  Allergen Reactions  . Morphine And Related Nausea And Vomiting    Family History  Problem Relation Age of Onset  . Hypertension Other     Social History Social History  Substance Use Topics  . Smoking status: Never Smoker  . Smokeless tobacco: Never Used  . Alcohol use No    Review of Systems Constitutional: Negative for fever. Cardiovascular: Negative for chest pain. Respiratory: Negative for shortness of breath. Gastrointestinal: Negative for abdominal pain Musculoskeletal: Negative for back pain. Neurological: Negative for Headache. Right arm  tingling/numbness. 10-point ROS otherwise negative.  ____________________________________________   PHYSICAL EXAM:  VITAL SIGNS: ED Triage Vitals [06/05/16 1806]  Enc Vitals Group     BP (!) 202/108     Pulse Rate (!) 53     Resp 18     Temp 97.6 F (36.4 C)     Temp Source Oral     SpO2 99 %     Weight 140 lb (63.5 kg)     Height 5\' 2"  (1.575 m)     Head Circumference      Peak Flow      Pain Score 0     Pain Loc      Pain Edu?       Excl. in GC?     Constitutional: Alert and oriented. Well appearing and in no distress. Eyes: Normal exam ENT   Head: Normocephalic and atraumatic.   Mouth/Throat: Mucous membranes are moist. Cardiovascular: Normal rate, regular rhythm. No murmur Respiratory: Normal respiratory effort without tachypnea nor retractions. Breath sounds are clear  Gastrointestinal: Soft and nontender. No distention.   Musculoskeletal: Nontender with normal range of motion in all extremities.  Neurologic:  Normal speech and language. No gross focal neurologic deficits. Sensation equal bilaterally in upper and lower extremities. Motor equal bilaterally in upper and lower extremity is. No pronator drift. Cranial nerves intact. No facial droop. Skin:  Skin is warm, dry and intact.  Psychiatric: Mood and affect are normal.   ____________________________________________    EKG  EKG reviewed and interpreted by my so shows normal sinus rhythm at 57 bpm, slightly widened QRS, left axis deviation, nonspecific ST changes. No ST elevation.  ____________________________________________    RADIOLOGY  CT scan shows no acute abnormality.  ____________________________________________   INITIAL IMPRESSION / ASSESSMENT AND PLAN / ED COURSE  Pertinent labs & imaging results that were available during my care of the patient were reviewed by me and considered in my medical decision making (see chart for details).  The patient presents emergency department with an elevated blood pressure in right arm tingling. She states her right arm tingling has been present for several days, but elevated blood pressure she since today. Saw her doctor yesterday but normal blood pressure. Patient's main concern is her elevated blood pressure. Denies any headache. Denies any weakness. On physical examination the patient appears well, no distress, normal physical exam. Patient has normal grip strengths and equal grip strength  bilaterally. Sinuses 5 motor in all extremities. Patient has no subjective numbness on exam. Cranial nerves intact without droop.  CT shows no acute abnormality. Patient states her symptoms are 100% resolved. Denies any right upper extremity paresthesias or numbness. Patient's blood pressure currently 156 systolic, she states she feels much better now that her blood pressure is gone down. States that was her main concern. Patient is currently taking Eliquis. As the patient had a normal neurologic exam with a normal CT, labs are largely within normal limits and she is asymptomatic already taking a blood thinner and discussed with the patient PCP follow-up as soon as possible, and immediate return to her to department for any numbness, weakness, slurred speech or confusion. The patient and daughter are agreeable to this plan.  ____________________________________________   FINAL CLINICAL IMPRESSION(S) / ED DIAGNOSES  Paresthesia Hypertension    Minna AntisKevin Joselyn Edling, MD 06/05/16 2205

## 2016-06-05 NOTE — ED Triage Notes (Signed)
Pt presents to ED with c/o hypertension, and numbness/tingling to R arm. Pt states numbness and tingling is restricted to her face. Facial symmetry intact, grip strengths equal bilaterally, noted to be weak on assessment. Pt is alert and oriented. Pt denies SHOB, C/P at this time.

## 2016-06-23 DIAGNOSIS — Z950 Presence of cardiac pacemaker: Secondary | ICD-10-CM | POA: Insufficient documentation

## 2016-08-15 DIAGNOSIS — J948 Other specified pleural conditions: Secondary | ICD-10-CM | POA: Insufficient documentation

## 2016-08-29 ENCOUNTER — Emergency Department: Payer: Medicare Other

## 2016-08-29 ENCOUNTER — Emergency Department
Admission: EM | Admit: 2016-08-29 | Discharge: 2016-08-29 | Disposition: A | Discharge status: Home / Self Care | Payer: Medicare Other | Attending: Emergency Medicine | Admitting: Emergency Medicine

## 2016-08-29 ENCOUNTER — Encounter: Payer: Self-pay | Admitting: Emergency Medicine

## 2016-08-29 DIAGNOSIS — Z79899 Other long term (current) drug therapy: Secondary | ICD-10-CM | POA: Diagnosis not present

## 2016-08-29 DIAGNOSIS — R0789 Other chest pain: Secondary | ICD-10-CM

## 2016-08-29 DIAGNOSIS — I10 Essential (primary) hypertension: Secondary | ICD-10-CM | POA: Diagnosis not present

## 2016-08-29 DIAGNOSIS — Z950 Presence of cardiac pacemaker: Secondary | ICD-10-CM | POA: Insufficient documentation

## 2016-08-29 LAB — CBC
HEMATOCRIT: 43 % (ref 35.0–47.0)
HEMOGLOBIN: 14.6 g/dL (ref 12.0–16.0)
MCH: 30.6 pg (ref 26.0–34.0)
MCHC: 33.9 g/dL (ref 32.0–36.0)
MCV: 90.2 fL (ref 80.0–100.0)
Platelets: 169 10*3/uL (ref 150–440)
RBC: 4.77 MIL/uL (ref 3.80–5.20)
RDW: 14.8 % — AB (ref 11.5–14.5)
WBC: 7.5 10*3/uL (ref 3.6–11.0)

## 2016-08-29 LAB — BASIC METABOLIC PANEL
Anion gap: 8 (ref 5–15)
BUN: 28 mg/dL — AB (ref 6–20)
CO2: 28 mmol/L (ref 22–32)
Calcium: 9.4 mg/dL (ref 8.9–10.3)
Chloride: 100 mmol/L — ABNORMAL LOW (ref 101–111)
Creatinine, Ser: 1.23 mg/dL — ABNORMAL HIGH (ref 0.44–1.00)
GFR calc Af Amer: 44 mL/min — ABNORMAL LOW (ref >60)
GFR, EST NON AFRICAN AMERICAN: 38 mL/min — AB (ref >60)
GLUCOSE: 130 mg/dL — AB (ref 65–99)
POTASSIUM: 3.8 mmol/L (ref 3.5–5.1)
Sodium: 136 mmol/L (ref 135–145)

## 2016-08-29 LAB — TROPONIN I: Troponin I: <0.03 ng/mL (ref <0.03)

## 2016-08-29 MED ORDER — IOPAMIDOL (ISOVUE-300) INJECTION 61%
60.0000 mL | Freq: Once | INTRAVENOUS | Status: AC | PRN
  Administered 2016-08-29: 60 mL via INTRAVENOUS

## 2016-08-29 MED ORDER — SODIUM CHLORIDE 0.9 % IV BOLUS (SEPSIS)
500.0000 mL | Freq: Once | INTRAVENOUS | Status: AC
  Administered 2016-08-29: 500 mL via INTRAVENOUS

## 2016-08-29 NOTE — ED Notes (Signed)
Pt is in good condition, discharge instructions reviewed, follow up care and home care reviewed; pt verbalized understanding; pt is ambulatory and went home with family member

## 2016-08-29 NOTE — ED Notes (Signed)
Patient to CT at this time

## 2016-08-29 NOTE — Discharge Instructions (Signed)
Please continue with over-the-counter Tylenol etc. for pain. Please contact her cardiologist and primary physician for further outpatient follow-up.  Please return immediately if condition worsens. Please contact her primary physician or the physician you were given for referral. If you have any specialist physicians involved in her treatment and plan please also contact them. Thank you for using [facility].

## 2016-08-29 NOTE — ED Triage Notes (Signed)
Pt with chest pain that started yesterday, central chest that radiated to back and down right arm. Pt with hx afib, pacemaker.

## 2016-08-29 NOTE — ED Provider Notes (Signed)
Time Seen: Approximately 1821  I have reviewed the triage notes  Chief Complaint: Chest Pain   History of Present Illness: Colleen Carson is a 82 y.o. female who has a history of recent pacemaker placement. She describes some intermittent chest discomfort especially with pushing herself up in the chairthat started yesterday. She states her pain started in the mid back region and also seemed to be related to movement. She denies any focal weakness. She denies anyradiation of pain into the arm or jaw area. She denies any trauma is concerned about her pacemaker whether or not this is creating her discomfort. She denies any er, chills, productive cough etc. She states that when she received her pacemaker she had developed a small pneumothorax at that time that took. Time to resolve.   Past Medical History:  Diagnosis Date  . Arthritis   . Atrial fibrillation (HCC)   . Dysrhythmia    Afib  . Hypercholesteremia   . Hypertension   . Lymphedema    lower extremities  . S/P total hip arthroplasty 01/25/2015  . Shingles   . Stroke [facility])    noted ischemia on CT scan    Patient Active Problem List   Diagnosis Date Noted  . Symptomatic bradycardia 05/15/2016  . HTN (hypertension) 05/15/2016  . HLD (hyperlipidemia) 05/15/2016  . Atrial fibrillation (HCC) 03/11/2016  . AKI (acute kidney injury) (HCC) 05/06/2015  . S/P total hip arthroplasty 01/25/2015  . Primary osteoarthritis of left hip 01/23/2015    Past Surgical History:  Procedure Laterality Date  . JOINT REPLACEMENT  01/23/15   Left THR Dr. Rosita Kea   . TOTAL HIP ARTHROPLASTY Left 01/23/2015   Procedure: TOTAL HIP ARTHROPLASTY ANTERIOR APPROACH;  Surgeon: Kennedy Bucker, MD;  Location: [facility] ORS;  Service: Orthopedics;  Laterality: Left;    Past Surgical History:  Procedure Laterality Date  . JOINT REPLACEMENT  01/23/15   Left THR Dr. Rosita Kea   . TOTAL HIP ARTHROPLASTY Left 01/23/2015   Procedure: TOTAL HIP ARTHROPLASTY ANTERIOR APPROACH;   Surgeon: Kennedy Bucker, MD;  Location: [facility] ORS;  Service: Orthopedics;  Laterality: Left;    Current Outpatient Rx  . Order #: [id number] Class: Historical Med  . Order #: [id number] Class: Historical Med  . Order #: [id number] Class: Historical Med  . Order #: [id number] Class: Historical Med  . Order #: [id number] Class: Historical Med  . Order #: [id number] Class: Historical Med  . Order #: [id number] Class: Historical Med    Allergies:  Morphine and related  Family History: Family History  Problem Relation Age of Onset  . Hypertension Other     Social History: Social History  Substance Use Topics  . Smoking status: Never Smoker  . Smokeless tobacco: Never Used  . Alcohol use No     Review of Systems:   10 point review of systems was performed and was otherwise negative:  Constitutional: No fever Eyes: No visual disturbances ENT: No sore throat, ear pain Cardiac: occasional sternall chest pain most likely with movement. Respiratory: No shortness of breath, wheezing, or stridor Abdomen: No abdominal pain, no vomiting, No diarrhea Endocrine: No weight loss, No night sweats Extremities: No peripheral edema, cyanosis Skin: No rashes, easy bruising Neurologic: No focal weakness, trouble with speech or swollowing Urologic: No dysuria, Hematuria, or urinary frequency   Physical Exam:  ED Triage Vitals  Enc Vitals Group     BP 08/29/16 1759 (!) 178/94     Pulse Rate 08/29/16 1757 69     Resp 08/29/16 1900 16  Temp 08/29/16 1757 97.7 F (36.5 C)     Temp Source 08/29/16 1757 Oral     SpO2 08/29/16 1757 99 %     Weight 08/29/16 1758 140 lb (63.5 kg)     Height 08/29/16 1758 4\' 11"  (1.499 m)     Head Circumference --      Peak Flow --      Pain Score 08/29/16 1802 6     Pain Loc --      Pain Edu? --      Excl. in GC? --     General: Awake , Alert , and Oriented times 3; GCS 15 Head: Normal cephalic , atraumatic Eyes: Pupils equal , round, reactive to  light Nose/Throat: No nasal drainage, patent upper airway without erythema or exudate.  Neck: Supple, Full range of motion, No anterior adenopathy or palpable thyroid masses Lungs: Clear to ascultation without wheezes , rhonchi, or rales Heart: Regular rate, regular rhythm without murmurs , gallops , or rubs Abdomen: Soft, non tender without rebound, guarding , or rigidity; bowel sounds positive and symmetric in all 4 quadrants. No organomegaly .        Extremities: 2 plus symmetric pulses. No edema, clubbing or cyanosis Neurologic: normal ambulation, Motor symmetric without deficits, sensory intact Skin: warm, dry, no rashes Pacemaker left upper chest wall region  Labs:   All laboratory work was reviewed including any pertinent negatives or positives listed below:  Labs Reviewed  BASIC METABOLIC PANEL - Abnormal; Notable for the following:       Result Value   Chloride 100 (*)    Glucose, Bld 130 (*)    BUN 28 (*)    Creatinine, Ser 1.23 (*)    GFR calc non Af Amer 38 (*)    GFR calc Af Amer 44 (*)    All other components within normal limits  CBC - Abnormal; Notable for the following:    RDW 14.8 (*)    All other components within normal limits  TROPONIN I  patient has some mild renal insufficiency but otherwise no abnormal findings.  EKG:  ED ECG REPORT I, Jennye MoccasinBrian S Quigley, the attending physician, personally viewed and interpreted this ECG.  Date: 08/29/2016 EKG Time: 1759 Rate: 80 Rhythm: AV sequential pacemaker QRS Axis: normal Intervals: normal ST/T Wave abnormalities: normal Conduction Disturbances: none Narrative Interpretation: unremarkable    Radiology: * "Dg Chest 2 View  Result Date: 08/29/2016 CLINICAL DATA:  Chest pain and shortness of Breath starting yesterday. EXAM: CHEST  2 VIEW COMPARISON:  06/05/2016 FINDINGS: Prominent atherosclerotic calcification of the aortic arch and descending thoracic aorta. Heart size within normal limits. Dual lead pacer is  new compared to the prior exam but appear satisfactorily positioned. No pneumothorax. Diffuse bony demineralization. Thoracic spondylosis. No blunting of the costophrenic angles or significant airspace opacity aside from mild lingular scarring. IMPRESSION: 1. Interval pacer placement, leads appear satisfactorily positioned, no pneumothorax or complicating feature. 2. Prominent atherosclerotic calcification of the thoracic aorta. 3. Minimal lingular scarring. 4. Bony demineralization. Electronically Signed   By: Gaylyn RongWalter  Liebkemann M.D.   On: 08/29/2016 18:34   Ct Chest W Contrast  Result Date: 08/29/2016 CLINICAL DATA:  Chest and back pain, onset yesterday. EXAM: CT CHEST WITH CONTRAST TECHNIQUE: Multidetector CT imaging of the chest was performed during intravenous contrast administration. CONTRAST:  60mL ISOVUE-300 IOPAMIDOL (ISOVUE-300) INJECTION 61% COMPARISON:  Radiograph earlier today. FINDINGS: Cardiovascular: 4 cm aneurysm ascending aorta, no dissection. Thoracic aorta is densely calcified with  mild noncalcified plaque as well. Descending thoracic aorta is tortuous. There are coronary artery calcifications. Cardiac pacemaker with tips in the right atrium and ventricle. No pericardial effusion. Mediastinum/Nodes: Calcified right hilar and subcarinal lymph nodes. No noncalcified mediastinal or hilar adenopathy. No axillary adenopathy. Probable left thyroidectomy with heterogeneous right thyroid lobe containing multiple nodules. The esophagus is decompressed. Trachea is midline. Lungs/Pleura: Calcified granuloma in the right middle and lower lobe. Noncalcified subpleural nodule in the right lower lobe measures 5 mm series 3, image 85. Tiny punctate and right upper lobe nodule series 3, image 69. Scattered linear atelectasis or scarring. No consolidation to suggest pneumonia. No pulmonary edema. No pleural fluid. Upper Abdomen: Splenic and hepatic granulomas. No acute abnormality. Musculoskeletal: The bones  appear under mineralized. There are no acute or suspicious osseous abnormalities. IMPRESSION: 1. No acute abnormality. 2. Coronary artery calcifications as well as advanced thoracic atherosclerosis. Ascending aortic aneurysm, maximal dimension 4.0 cm. Recommend annual imaging followup by CTA. This recommendation follows 2010 ACCF/AHA/AATS/ACR/ASA/SCA/SCAI/SIR/STS/SVM Guidelines for the Diagnosis and Management of Patients with Thoracic Aortic Disease. Circulation. 2010; 121: [id number] 3. Sequela of prior granulomatous disease with calcified right lung nodules and calcified nodes. There are small noncalcified nodules in the right lung, largest measuring 5 mm. These are likely secondary to prior granulomatous disease. These can be followed up on chest CTA for aneurysm follow-up. Electronically Signed   By: Rubye OaksMelanie  Ehinger M.D.   On: 08/29/2016 20:02  "  I personally reviewed the radiologic studies    ED Course: The patient was advised to results of her chest CAT scan . Patient was advised to follow up with her primary physician and cardiologist on Monday.I felt her presentation was unlikely to be from a life-threatening etiologies such as acute coronary syndrome, aortic  Pneumothorax, pulmonary embolism etc. She has what appears to be overall negative for objective studies and troponin is negative with a greater than 24r history of chest pain.her pain seems to be have started in the back and is worse with movement especially when she pushes herself up in a chair. Clinical Course      Assessment: Atypical chest pain Chest wall discomfort   Final Clinical Impression: *  Final diagnoses:  Chest wall pain     Plan:  Outpatient Patient was advised to return immediately if condition worsens. Patient was advised to follow up with their primary care physician or other specialized physicians involved in their outpatient care. The patient and/or family member/power of attorney had laboratory results  reviewed at the bedside. All questions and concerns were addressed and appropriate discharge instructions were distributed by the nursing staff.             Jennye MoccasinBrian S Quigley, MD 08/29/16 [phone]

## 2016-10-01 ENCOUNTER — Encounter: Payer: Self-pay | Admitting: *Deleted

## 2016-10-01 ENCOUNTER — Inpatient Hospital Stay
Admission: EM | Admit: 2016-10-01 | Discharge: 2016-10-02 | DRG: 069 | Disposition: A | Discharge status: Home Health | Payer: Medicare Other | Attending: Internal Medicine | Admitting: Internal Medicine

## 2016-10-01 ENCOUNTER — Emergency Department: Payer: Medicare Other

## 2016-10-01 DIAGNOSIS — Z885 Allergy status to narcotic agent status: Secondary | ICD-10-CM | POA: Diagnosis not present

## 2016-10-01 DIAGNOSIS — Z8673 Personal history of transient ischemic attack (TIA), and cerebral infarction without residual deficits: Secondary | ICD-10-CM

## 2016-10-01 DIAGNOSIS — Z79899 Other long term (current) drug therapy: Secondary | ICD-10-CM

## 2016-10-01 DIAGNOSIS — E785 Hyperlipidemia, unspecified: Secondary | ICD-10-CM | POA: Diagnosis present

## 2016-10-01 DIAGNOSIS — G459 Transient cerebral ischemic attack, unspecified: Secondary | ICD-10-CM | POA: Diagnosis present

## 2016-10-01 DIAGNOSIS — R2681 Unsteadiness on feet: Secondary | ICD-10-CM

## 2016-10-01 DIAGNOSIS — R4182 Altered mental status, unspecified: Secondary | ICD-10-CM | POA: Diagnosis present

## 2016-10-01 DIAGNOSIS — G9340 Encephalopathy, unspecified: Secondary | ICD-10-CM | POA: Diagnosis present

## 2016-10-01 DIAGNOSIS — Z96642 Presence of left artificial hip joint: Secondary | ICD-10-CM | POA: Diagnosis present

## 2016-10-01 DIAGNOSIS — E7800 Pure hypercholesterolemia, unspecified: Secondary | ICD-10-CM | POA: Diagnosis present

## 2016-10-01 DIAGNOSIS — Z7901 Long term (current) use of anticoagulants: Secondary | ICD-10-CM | POA: Diagnosis not present

## 2016-10-01 DIAGNOSIS — I4891 Unspecified atrial fibrillation: Secondary | ICD-10-CM | POA: Diagnosis present

## 2016-10-01 DIAGNOSIS — I10 Essential (primary) hypertension: Secondary | ICD-10-CM | POA: Diagnosis present

## 2016-10-01 DIAGNOSIS — Z66 Do not resuscitate: Secondary | ICD-10-CM | POA: Diagnosis present

## 2016-10-01 DIAGNOSIS — Z8249 Family history of ischemic heart disease and other diseases of the circulatory system: Secondary | ICD-10-CM | POA: Diagnosis not present

## 2016-10-01 DIAGNOSIS — Z950 Presence of cardiac pacemaker: Secondary | ICD-10-CM

## 2016-10-01 DIAGNOSIS — R29709 NIHSS score 9: Secondary | ICD-10-CM | POA: Diagnosis present

## 2016-10-01 LAB — DIFFERENTIAL
BASOS ABS: 0.1 10*3/uL (ref 0–0.1)
Basophils Relative: 1 %
EOS ABS: 0.1 10*3/uL (ref 0–0.7)
Eosinophils Relative: 2 %
LYMPHS ABS: 3.5 10*3/uL (ref 1.0–3.6)
Lymphocytes Relative: 48 %
Monocytes Absolute: 0.7 10*3/uL (ref 0.2–0.9)
Monocytes Relative: 10 %
NEUTROS ABS: 2.9 10*3/uL (ref 1.4–6.5)
Neutrophils Relative %: 39 %

## 2016-10-01 LAB — CBC
HCT: 44.8 % (ref 35.0–47.0)
HEMOGLOBIN: 14.8 g/dL (ref 12.0–16.0)
MCH: 30 pg (ref 26.0–34.0)
MCHC: 32.9 g/dL (ref 32.0–36.0)
MCV: 91.1 fL (ref 80.0–100.0)
Platelets: 180 10*3/uL (ref 150–440)
RBC: 4.92 MIL/uL (ref 3.80–5.20)
RDW: 14.6 % — AB (ref 11.5–14.5)
WBC: 7.2 10*3/uL (ref 3.6–11.0)

## 2016-10-01 LAB — PROTIME-INR
INR: 1.2
PROTHROMBIN TIME: 15.3 s — AB (ref 11.4–15.2)

## 2016-10-01 LAB — APTT: aPTT: 37 seconds — ABNORMAL HIGH (ref 24–36)

## 2016-10-01 NOTE — ED Triage Notes (Signed)
Pt brought to triage via wheelchair.  Pt is from home.  soninlaw reports pt had period of confusion that started at 1940 tonight.  Pt stood up and stared off into space and could not remember.  No h/a.  Pt taken to ct scan from triage with rn.   No slurred speech at this time.

## 2016-10-01 NOTE — H&P (Signed)
[facility] [place] [facility]   PATIENT NAME: Colleen MoundHilda Vecchione    MR#:  [id number]  DATE OF BIRTH:  Oct 21, 1924  DATE OF ADMISSION:  10/01/2016  PRIMARY CARE PHYSICIAN: [name], Lavada MesiMARC E, MD   REQUESTING/REFERRING PHYSICIAN: Roxan Hockeyobinson, MD  CHIEF COMPLAINT:   Chief Complaint  Patient presents with  . Altered Mental Status    HISTORY OF PRESENT ILLNESS:  Colleen MoundHilda Carson  is a 83 y.o. female who presents with An episode of acute encephalopathy. She was in her home and her son found her standing in the living room, not looking at TV, staring off, and his was very preoccupied that she could not figure out how to use the remote. After about 5 minutes of this interaction they brought her to the ED for evaluation. This occurred at about 8 PM today. Workup here was largely negative upfront, however she did have some persistent confusion which slowly cleared here in the ED. Telemetry neurology saw the patient and recommended admission and workup. Hospitalists were called for the same.  PAST MEDICAL HISTORY:   Past Medical History:  Diagnosis Date  . Arthritis   . Atrial fibrillation (HCC)   . Dysrhythmia    Afib  . Hypercholesteremia   . Hypertension   . Lymphedema    lower extremities  . S/P total hip arthroplasty 01/25/2015  . Shingles   . Stroke [facility](HCC)    noted ischemia on CT scan    PAST SURGICAL HISTORY:   Past Surgical History:  Procedure Laterality Date  . JOINT REPLACEMENT  01/23/15   Left THR Dr. Rosita KeaMenz   . PACEMAKER INSERTION  2017  . TOTAL HIP ARTHROPLASTY Left 01/23/2015   Procedure: TOTAL HIP ARTHROPLASTY ANTERIOR APPROACH;  Surgeon: Kennedy BuckerMichael Menz, MD;  Location: [facility] ORS;  Service: Orthopedics;  Laterality: Left;    SOCIAL HISTORY:   Social History  Substance Use Topics  . Smoking status: Never Smoker  . Smokeless tobacco: Never Used  . Alcohol use No    FAMILY HISTORY:   Family History  Problem Relation Age of Onset  . Hypertension Other      DRUG ALLERGIES:   Allergies  Allergen Reactions  . Morphine And Related Nausea And Vomiting    MEDICATIONS AT HOME:   Prior to Admission medications   Medication Sig Start Date End Date Taking? Authorizing Provider  apixaban (ELIQUIS) 5 MG TABS tablet Take 5 mg by mouth 2 (two) times daily.    Historical Provider, MD  fluticasone (FLONASE) 50 MCG/ACT nasal spray Place 2 sprays into both nostrils daily as needed for rhinitis.     Historical Provider, MD  hydrochlorothiazide (HYDRODIURIL) 12.5 MG tablet Take 12.5 mg by mouth daily.     Historical Provider, MD  methimazole (TAPAZOLE) 5 MG tablet Take 1 tablet by mouth daily. 07/11/16   Historical Provider, MD  Multiple Vitamins-Minerals (CENTRUM SILVER PO) Take 1 tablet by mouth daily.    Historical Provider, MD  oxyCODONE (OXY IR/ROXICODONE) 5 MG immediate release tablet  06/25/16   Historical Provider, MD  simvastatin (ZOCOR) 20 MG tablet Take 20 mg by mouth every evening.     Historical Provider, MD    REVIEW OF SYSTEMS:  Review of Systems  Constitutional: Negative for chills, fever, malaise/fatigue and weight loss.  HENT: Negative for ear pain, hearing loss and tinnitus.   Eyes: Negative for blurred vision, double vision, pain and redness.  Respiratory: Negative for cough, hemoptysis and shortness of breath.   Cardiovascular:  Negative for chest pain, palpitations, orthopnea and leg swelling.  Gastrointestinal: Negative for abdominal pain, constipation, diarrhea, nausea and vomiting.  Genitourinary: Negative for dysuria, frequency and hematuria.  Musculoskeletal: Negative for back pain, joint pain and neck pain.  Skin:       No acne, rash, or lesions  Neurological: Negative for dizziness, tremors, focal weakness and weakness.       Confusion  Endo/Heme/Allergies: Negative for polydipsia. Does not bruise/bleed easily.  Psychiatric/Behavioral: Negative for depression. The patient is not nervous/anxious and does not have  insomnia.      VITAL SIGNS:   Vitals:   10/01/16 2002 10/01/16 2005 10/01/16 2015  BP:  (!) 180/81   Pulse: 64    Resp: 18    Temp: 97.8 F (36.6 C)    TempSrc: Oral    SpO2: 98%    Weight:   54.4 kg (120 lb)  Height:   5\' 4"  (1.626 m)   Wt Readings from Last 3 Encounters:  10/01/16 54.4 kg (120 lb)  08/29/16 63.5 kg (140 lb)  06/05/16 63.5 kg (140 lb)    PHYSICAL EXAMINATION:  Physical Exam  Vitals reviewed. Constitutional: She is oriented to person, place, and time. She appears well-developed and well-nourished. No distress.  HENT:  Head: Normocephalic and atraumatic.  Mouth/Throat: Oropharynx is clear and moist.  Eyes: Conjunctivae and EOM are normal. Pupils are equal, round, and reactive to light. No scleral icterus.  Neck: Normal range of motion. Neck supple. No JVD present. No thyromegaly present.  Cardiovascular: Normal rate, regular rhythm and intact distal pulses.  Exam reveals no gallop and no friction rub.   No murmur heard. Respiratory: Effort normal and breath sounds normal. No respiratory distress. She has no wheezes. She has no rales.  GI: Soft. Bowel sounds are normal. She exhibits no distension. There is no tenderness.  Musculoskeletal: Normal range of motion. She exhibits no edema.  No arthritis, no gout  Lymphadenopathy:    She has no cervical adenopathy.  Neurological: She is alert and oriented to person, place, and time. No cranial nerve deficit.  Neurologic: Cranial nerves II-XII intact, Sensation intact to light touch/pinprick, 5/5 strength in all extremities, no dysarthria, no aphasia, no dysphagia, memory intact, no pronator drift   Skin: Skin is warm and dry. No rash noted. No erythema.  Psychiatric: She has a normal mood and affect. Her behavior is normal. Judgment and thought content normal.    LABORATORY PANEL:   CBC  Recent Labs Lab 10/01/16 2020  WBC 7.2  HGB 14.8  HCT 44.8  PLT 180    ------------------------------------------------------------------------------------------------------------------  Chemistries  No results for input(s): NA, K, CL, CO2, GLUCOSE, BUN, CREATININE, CALCIUM, MG, AST, ALT, ALKPHOS, BILITOT in the last 168 hours.  Invalid input(s): GFRCGP ------------------------------------------------------------------------------------------------------------------  Cardiac Enzymes No results for input(s): TROPONINI in the last 168 hours. ------------------------------------------------------------------------------------------------------------------  RADIOLOGY:  Ct Head Code Stroke W/o Cm  Result Date: 10/01/2016 CLINICAL DATA:  Code stroke. Sudden onset weakness and altered mental status. EXAM: CT HEAD WITHOUT CONTRAST TECHNIQUE: Contiguous axial images were obtained from the base of the skull through the vertex without intravenous contrast. COMPARISON:  Head CT 06/05/2016 FINDINGS: Brain: There is no evidence of acute cortical infarct, intracranial hemorrhage, mass, midline shift, or extra-axial fluid collection. A chronic left occipital infarct is unchanged. Cerebral volume is otherwise within normal limits for age. Scattered cerebral white matter hypodensities are similar to the prior study and nonspecific but compatible with very mild chronic small vessel  ischemic disease. Vascular: Calcified atherosclerosis at the skullbase. No hyperdense vessel. Skull: No fracture or focal osseous lesion. Sinuses/Orbits: Visualized paranasal sinuses and mastoid air cells are clear. Visualized orbits are unremarkable. Other: None. ASPECTS [facility] Stroke Program Early CT Score) - Ganglionic level infarction (caudate, lentiform nuclei, internal capsule, insula, M1-M3 cortex): 7 - Supraganglionic infarction (M4-M6 cortex): 3 Total score (0-10 with 10 being normal): 10 IMPRESSION: 1. No evidence of acute intracranial abnormality. 2. ASPECTS is 10. 3. Chronic left occipital lobe  infarct. These results were called by telephone at the time of interpretation on 10/01/2016 at 8:21 pm to Dr. Willy Eddy , who verbally acknowledged these results. Electronically Signed   By: Sebastian Ache M.D.   On: 10/01/2016 20:22    EKG:   Orders placed or performed during the hospital encounter of 10/01/16  . ED EKG  . ED EKG  . EKG 12-Lead  . EKG 12-Lead    IMPRESSION AND PLAN:  Principal Problem:   Acute encephalopathy - patient has significant risk factors for stroke, and there is obviously concern that she may have had a stroke. See history of present illness for description of the neurologic deficit. We will admit her with stroke order set orders, including appropriate imaging with MRI (patient's pacemaker was placed last year and is likely MRI compatible), neurology consult, and other appropriate labs and consults. Active Problems:   Atrial fibrillation (HCC) - paced rhythm, continue home meds including anticoagulation   HTN (hypertension) - permissive hypertension, goal is less than 220/120 for the first 24 hours   HLD (hyperlipidemia) - continue home meds  All the records are reviewed and case discussed with ED provider. Management plans discussed with the patient and/or family.  DVT PROPHYLAXIS: Systemic anticoagulation  GI PROPHYLAXIS: None  ADMISSION STATUS: Inpatient  CODE STATUS: DNR Code Status History    Date Active Date Inactive Code Status Order ID Comments User Context   05/15/2016  2:19 AM 05/15/2016  3:19 PM DNR [id number]  Oralia Manis, MD Inpatient   03/11/2016  8:39 AM 03/13/2016  4:09 PM DNR [id number]  Wyatt Haste, MD ED   03/11/2016  8:39 AM 03/11/2016  8:39 AM Full Code [id number]  Wyatt Haste, MD ED   05/06/2015  3:01 AM 05/07/2015  3:02 PM DNR [id number]  Arnaldo Natal, MD Inpatient   05/06/2015  1:57 AM 05/06/2015  3:01 AM Full Code [id number]  Arnaldo Natal, MD ED   01/23/2015 11:21 AM 01/26/2015  4:25 PM Full Code [id number]  Kennedy Bucker, MD  Inpatient    Questions for Most Recent Historical Code Status (Order [id number])    Question Answer Comment   In the event of cardiac or respiratory ARREST Do not call a "code blue"    In the event of cardiac or respiratory ARREST Do not perform Intubation, CPR, defibrillation or ACLS    In the event of cardiac or respiratory ARREST Use medication by any route, position, wound care, and other measures to relive pain and suffering. May use oxygen, suction and manual treatment of airway obstruction as needed for comfort.       TOTAL TIME TAKING CARE OF THIS PATIENT: 45 minutes.    Jackelyn Illingworth FIELDING 10/01/2016, 9:24 PM  [facility]  Office  [phone]  CC: Primary care physician; Rozanna Box, MD

## 2016-10-01 NOTE — ED Provider Notes (Signed)
[facility] Emergency Department Provider Note    First MD Initiated Contact with Patient 10/01/16 2014     (approximate)  I have reviewed the triage vital signs and the nursing notes.   HISTORY  Chief Complaint Altered Mental Status    HPI HALLEL DENHERDER is a 83 y.o. female presents with difficulty using a remote control tonight roughly 1 hour prior to arrival. Sotalol was with the patient states that she was otherwise acting normal throughout the day. Walk in the room and found her standing staring at the TV and could not find her words or remember what she was doing. She is on Eliquis and has a pacemaker. Denies any chest pain. No nausea or vomiting. No recent fevers. No dysuria.   Past Medical History:  Diagnosis Date  . Arthritis   . Atrial fibrillation (HCC)   . Dysrhythmia    Afib  . Hypercholesteremia   . Hypertension   . Lymphedema    lower extremities  . S/P total hip arthroplasty 01/25/2015  . Shingles   . Stroke [facility])    noted ischemia on CT scan   Family History  Problem Relation Age of Onset  . Hypertension Other    Past Surgical History:  Procedure Laterality Date  . JOINT REPLACEMENT  01/23/15   Left THR Dr. Rosita Kea   . PACEMAKER INSERTION  2017  . TOTAL HIP ARTHROPLASTY Left 01/23/2015   Procedure: TOTAL HIP ARTHROPLASTY ANTERIOR APPROACH;  Surgeon: Kennedy Bucker, MD;  Location: [facility] ORS;  Service: Orthopedics;  Laterality: Left;   Patient Active Problem List   Diagnosis Date Noted  . Acute encephalopathy 10/01/2016  . Symptomatic bradycardia 05/15/2016  . HTN (hypertension) 05/15/2016  . HLD (hyperlipidemia) 05/15/2016  . Atrial fibrillation (HCC) 03/11/2016  . AKI (acute kidney injury) (HCC) 05/06/2015  . S/P total hip arthroplasty 01/25/2015  . Primary osteoarthritis of left hip 01/23/2015      Prior to Admission medications   Medication Sig Start Date End Date Taking? Authorizing Provider  apixaban (ELIQUIS) 5 MG TABS  tablet Take 5 mg by mouth 2 (two) times daily.   Yes Historical Provider, MD  fluticasone (FLONASE) 50 MCG/ACT nasal spray Place 2 sprays into both nostrils daily as needed for rhinitis.    Yes Historical Provider, MD  hydrochlorothiazide (HYDRODIURIL) 12.5 MG tablet Take 12.5 mg by mouth daily.    Yes Historical Provider, MD  methimazole (TAPAZOLE) 5 MG tablet Take 1 tablet by mouth daily. 07/11/16  Yes Historical Provider, MD  Multiple Vitamins-Minerals (CENTRUM SILVER PO) Take 1 tablet by mouth daily.   Yes Historical Provider, MD  simvastatin (ZOCOR) 20 MG tablet Take 20 mg by mouth every evening.    Yes Historical Provider, MD    Allergies Morphine and related    Social History Social History  Substance Use Topics  . Smoking status: Never Smoker  . Smokeless tobacco: Never Used  . Alcohol use No    Review of Systems Patient denies headaches, rhinorrhea, blurry vision, numbness, shortness of breath, chest pain, edema, cough, abdominal pain, nausea, vomiting, diarrhea, dysuria, fevers, rashes or hallucinations unless otherwise stated above in HPI. ____________________________________________   PHYSICAL EXAM:  VITAL SIGNS: Vitals:   10/01/16 2331 10/02/16 0000  BP:  (!) 170/80  Pulse:  65  Resp:  17  Temp: 97.8 F (36.6 C)     Constitutional: Alert and oriented. Well appearing and in no acute distress. Eyes: Conjunctivae are normal. PERRL. EOMI. Head: Atraumatic.  Nose: No congestion/rhinnorhea. Mouth/Throat: Mucous membranes are moist.  Oropharynx non-erythematous. Neck: No stridor. Painless ROM. No cervical spine tenderness to palpation Hematological/Lymphatic/Immunilogical: No cervical lymphadenopathy. Cardiovascular: Normal rate, regular rhythm. Grossly normal heart sounds.  Good peripheral circulation. Respiratory: Normal respiratory effort.  No retractions. Lungs CTAB. Gastrointestinal: Soft and nontender. No distention. No abdominal bruits. No CVA  tenderness. Musculoskeletal: No lower extremity tenderness nor edema.  No joint effusions. Neurologic:  No facial droop, mild weakness to LUE,  Otherwise intact.  Having difficulty remembering symptoms.  repetitive  Skin:  Skin is warm, dry and intact. No rash noted. Psychiatric: Mood and affect are normal. Speech and behavior are normal.  ____________________________________________   LABS (all labs ordered are listed, but only abnormal results are displayed)  Results for orders placed or performed during the hospital encounter of 10/01/16 (from the past 24 hour(s))  CBC     Status: Abnormal   Collection Time: 10/01/16  8:20 PM  Result Value Ref Range   WBC 7.2 3.6 - 11.0 K/uL   RBC 4.92 3.80 - 5.20 MIL/uL   Hemoglobin 14.8 12.0 - 16.0 g/dL   HCT 16.144.8 09.635.0 - 04.547.0 %   MCV 91.1 80.0 - 100.0 fL   MCH 30.0 26.0 - 34.0 pg   MCHC 32.9 32.0 - 36.0 g/dL   RDW 40.914.6 (H) 81.111.5 - 91.414.5 %   Platelets 180 150 - 440 K/uL  Differential     Status: None   Collection Time: 10/01/16  8:20 PM  Result Value Ref Range   Neutrophils Relative % 39 %   Neutro Abs 2.9 1.4 - 6.5 K/uL   Lymphocytes Relative 48 %   Lymphs Abs 3.5 1.0 - 3.6 K/uL   Monocytes Relative 10 %   Monocytes Absolute 0.7 0.2 - 0.9 K/uL   Eosinophils Relative 2 %   Eosinophils Absolute 0.1 0 - 0.7 K/uL   Basophils Relative 1 %   Basophils Absolute 0.1 0 - 0.1 K/uL  APTT     Status: Abnormal   Collection Time: 10/01/16  8:21 PM  Result Value Ref Range   aPTT 37 (H) 24 - 36 seconds  Protime-INR     Status: Abnormal   Collection Time: 10/01/16  8:21 PM  Result Value Ref Range   Prothrombin Time 15.3 (H) 11.4 - 15.2 seconds   INR 1.20   Comprehensive metabolic panel     Status: Abnormal   Collection Time: 10/01/16 11:45 PM  Result Value Ref Range   Sodium 139 135 - 145 mmol/L   Potassium 3.4 (L) 3.5 - 5.1 mmol/L   Chloride 102 101 - 111 mmol/L   CO2 29 22 - 32 mmol/L   Glucose, Bld 138 (H) 65 - 99 mg/dL   BUN 30 (H) 6 - 20  mg/dL   Creatinine, Ser 7.821.00 0.44 - 1.00 mg/dL   Calcium 9.1 8.9 - 95.610.3 mg/dL   Total Protein 6.5 6.5 - 8.1 g/dL   Albumin 3.8 3.5 - 5.0 g/dL   AST 30 15 - 41 U/L   ALT 16 14 - 54 U/L   Alkaline Phosphatase 153 (H) 38 - 126 U/L   Total Bilirubin 0.2 (L) 0.3 - 1.2 mg/dL   GFR calc non Af Amer 48 (L) >60 mL/min   GFR calc Af Amer 56 (L) >60 mL/min   Anion gap 8 5 - 15  Troponin I     Status: None   Collection Time: 10/01/16 11:45 PM  Result Value Ref Range  Troponin I <0.03 <0.03 ng/mL   ____________________________________________  EKG My review and personal interpretation at Time: 20:26   Indication: cva  Rate: 60  Rhythm: v paced Axis: left Other: no sgarbossa criteria ____________________________________________  RADIOLOGY  I personally reviewed all radiographic images ordered to evaluate for the above acute complaints and reviewed radiology reports and findings.  These findings were personally discussed with the patient.  Please see medical record for radiology report.  ____________________________________________   PROCEDURES  Procedure(s) performed:  Procedures    Critical Care performed: no ____________________________________________   INITIAL IMPRESSION / ASSESSMENT AND PLAN / ED COURSE  Pertinent labs & imaging results that were available during my care of the patient were reviewed by me and considered in my medical decision making (see chart for details).  DDX: cva, tia, hypoglycemia, dehydration, electrolyte abnormality, dissection, sepsis   REVECCA NACHTIGAL is a 83 y.o. who presents to the ED with With acute symptoms as described above. Patient made code stroke due to onset of symptoms. Patient afebrile and hemodynamic stable. Does not appear to be demonstrating any evidence of seizure activity. CT head shows no evidence of bleed. No evidence of acute cardiac pathology at this time. No evidence of sepsis or electrolyte abnormality. Spoke with telemetry  neurologist who is concern for transient global amnesia and is recommended admission for further evaluation and management.  Have discussed with the patient and available family all diagnostics and treatments performed thus far and all questions were answered to the best of my ability. The patient demonstrates understanding and agreement with plan.   Clinical Course      ____________________________________________   FINAL CLINICAL IMPRESSION(S) / ED DIAGNOSES  Final diagnoses:  Acute encephalopathy      NEW MEDICATIONS STARTED DURING THIS VISIT:  Current Discharge Medication List       Note:  This document was prepared using Dragon voice recognition software and may include unintentional dictation errors.    Willy Eddy, MD 10/02/16 0040

## 2016-10-02 ENCOUNTER — Inpatient Hospital Stay: Payer: Medicare Other

## 2016-10-02 ENCOUNTER — Inpatient Hospital Stay
Admit: 2016-10-02 | Discharge: 2016-10-02 | Disposition: A | Discharge status: Home / Self Care | Payer: Medicare Other | Attending: Internal Medicine | Admitting: Internal Medicine

## 2016-10-02 DIAGNOSIS — R4182 Altered mental status, unspecified: Secondary | ICD-10-CM | POA: Diagnosis not present

## 2016-10-02 DIAGNOSIS — G9340 Encephalopathy, unspecified: Secondary | ICD-10-CM

## 2016-10-02 DIAGNOSIS — G459 Transient cerebral ischemic attack, unspecified: Secondary | ICD-10-CM | POA: Diagnosis not present

## 2016-10-02 LAB — COMPREHENSIVE METABOLIC PANEL
ALK PHOS: 153 U/L — AB (ref 38–126)
ALT: 16 U/L (ref 14–54)
ANION GAP: 8 (ref 5–15)
AST: 30 U/L (ref 15–41)
Albumin: 3.8 g/dL (ref 3.5–5.0)
BUN: 30 mg/dL — ABNORMAL HIGH (ref 6–20)
CO2: 29 mmol/L (ref 22–32)
Calcium: 9.1 mg/dL (ref 8.9–10.3)
Chloride: 102 mmol/L (ref 101–111)
Creatinine, Ser: 1 mg/dL (ref 0.44–1.00)
GFR calc Af Amer: 56 mL/min — ABNORMAL LOW (ref >60)
GFR calc non Af Amer: 48 mL/min — ABNORMAL LOW (ref >60)
GLUCOSE: 138 mg/dL — AB (ref 65–99)
POTASSIUM: 3.4 mmol/L — AB (ref 3.5–5.1)
SODIUM: 139 mmol/L (ref 135–145)
Total Bilirubin: 0.2 mg/dL — ABNORMAL LOW (ref 0.3–1.2)
Total Protein: 6.5 g/dL (ref 6.5–8.1)

## 2016-10-02 LAB — LIPID PANEL
Cholesterol: 157 mg/dL (ref 0–200)
HDL: 86 mg/dL (ref >40)
LDL CALC: 59 mg/dL (ref 0–99)
TRIGLYCERIDES: 58 mg/dL (ref <150)
Total CHOL/HDL Ratio: 1.8 RATIO
VLDL: 12 mg/dL (ref 0–40)

## 2016-10-02 LAB — ECHOCARDIOGRAM COMPLETE
Height: 64 in
Weight: 1920 oz

## 2016-10-02 LAB — TROPONIN I: Troponin I: <0.03 ng/mL (ref <0.03)

## 2016-10-02 MED ORDER — SIMVASTATIN 20 MG PO TABS
20.0000 mg | ORAL_TABLET | Freq: Every evening | ORAL | Status: DC
  Administered 2016-10-02: 18:00:00 20 mg via ORAL
  Filled 2016-10-02: qty 1

## 2016-10-02 MED ORDER — APIXABAN 5 MG PO TABS: 5.0000 mg | ORAL_TABLET | Freq: Two times a day (BID) | ORAL | Status: DC

## 2016-10-02 MED ORDER — SENNOSIDES-DOCUSATE SODIUM 8.6-50 MG PO TABS: 1.0000 | ORAL_TABLET | Freq: Every evening | ORAL | Status: DC | PRN

## 2016-10-02 MED ORDER — ACETAMINOPHEN 325 MG PO TABS: 650.0000 mg | ORAL_TABLET | ORAL | Status: DC | PRN

## 2016-10-02 MED ORDER — METHIMAZOLE 10 MG PO TABS: 5.0000 mg | ORAL_TABLET | Freq: Every day | ORAL | Status: DC

## 2016-10-02 MED ORDER — ACETAMINOPHEN 160 MG/5ML PO SOLN: 650.0000 mg | ORAL | Status: DC | PRN

## 2016-10-02 MED ORDER — ACETAMINOPHEN 650 MG RE SUPP: 650.0000 mg | RECTAL | Status: DC | PRN

## 2016-10-02 MED ORDER — STROKE: EARLY STAGES OF RECOVERY BOOK
Freq: Once | Status: AC
  Administered 2016-10-02: 01:00:00

## 2016-10-02 MED ORDER — APIXABAN 2.5 MG PO TABS
2.5000 mg | ORAL_TABLET | Freq: Two times a day (BID) | ORAL | Status: DC
  Administered 2016-10-02: 13:00:00 2.5 mg via ORAL
  Filled 2016-10-02: qty 1

## 2016-10-02 MED ORDER — ASPIRIN EC 81 MG PO TBEC: 81.0000 mg | DELAYED_RELEASE_TABLET | Freq: Every day | ORAL | Status: DC

## 2016-10-02 NOTE — Discharge Instructions (Signed)
Resume diet and activity as before ° ° °

## 2016-10-02 NOTE — Progress Notes (Signed)
Nutrition Brief Note  Patient identified on the Malnutrition Screening Tool (MST) Report  Wt Readings from Last 15 Encounters:  10/01/16 120 lb (54.4 kg)  08/29/16 140 lb (63.5 kg)  06/05/16 140 lb (63.5 kg)  05/15/16 146 lb 6.4 oz (66.4 kg)  03/18/16 148 lb (67.1 kg)  03/11/16 154 lb 4.8 oz (70 kg)  03/04/16 146 lb (66.2 kg)  09/22/15 139 lb (63 kg)  05/07/15 153 lb 3.2 oz (69.5 kg)  01/23/15 145 lb (65.8 kg)  01/10/15 152 lb (68.9 kg)   Spoke with patient at bedside. She reports her appetite is great now and PTA. She has 2 meals daily with snacks between. Patient reports she has slowly been losing 40 lbs over the past 3 years on purpose. She has started following a heart healthy diet at home and reducing her intake of sweets and sugar-sweetened beverages.   Body mass index is 20.6 kg/m. Patient meets criteria for Normal Weight based on current BMI.   Current diet order is Heart Healthy, patient is consuming approximately 100% of meals at this time (per report - not recorded in chart). Labs and medications reviewed.   No nutrition interventions warranted at this time. If nutrition issues arise, please consult RD.   Helane RimaLeanne Eladia Frame, MS, RD, LDN Pager: [phone] After Hours Pager: [phone]

## 2016-10-02 NOTE — Plan of Care (Signed)
Problem: Education: Goal: Knowledge of [facility] Education information/materials will improve Outcome: Progressing Pt likes to be called Colleen Carson  Past Medical History:  Diagnosis Date  . Arthritis   . Atrial fibrillation (HCC)   . Dysrhythmia    Afib  . Hypercholesteremia   . Hypertension   . Lymphedema    lower extremities  . S/P total hip arthroplasty 01/25/2015  . Shingles   . Stroke [facility](HCC)    noted ischemia on CT scan   Pt is well controlled with home mediations

## 2016-10-02 NOTE — Consult Note (Signed)
Referring Physician: Sudini    Chief Complaint: Episode of difficulty with speech  HPI: Colleen Carson is an 83 y.o. female with a history of atrial fibrillation s/p pacer who reports that on yesterday she was with her son-in-law and became acutely confused.  Was unable to determine how to use the remote control.  Could not get her words out.  Was staring into space at times.  With no improvement in symptoms patient was brought in for evaluation by family.  Started to improve en route with episode lasting about one hour.  Initial NIHSS of 9.  Date last known well: Date: 10/01/2016 Time last known well: Time: 18:30 tPA Given: No: Patient on Eliquis  Past Medical History:  Diagnosis Date  . Arthritis   . Atrial fibrillation (HCC)   . Dysrhythmia    Afib  . Hypercholesteremia   . Hypertension   . Lymphedema    lower extremities  . S/P total hip arthroplasty 01/25/2015  . Shingles   . Stroke [facility])    noted ischemia on CT scan    Past Surgical History:  Procedure Laterality Date  . JOINT REPLACEMENT  01/23/15   Left THR Dr. Rosita Kea   . PACEMAKER INSERTION  2017  . TOTAL HIP ARTHROPLASTY Left 01/23/2015   Procedure: TOTAL HIP ARTHROPLASTY ANTERIOR APPROACH;  Surgeon: Kennedy Bucker, MD;  Location: [facility] ORS;  Service: Orthopedics;  Laterality: Left;    Family History  Problem Relation Age of Onset  . Hypertension Other    Social History:  reports that she has never smoked. She has never used smokeless tobacco. She reports that she does not drink alcohol or use drugs.  Allergies:  Allergies  Allergen Reactions  . Morphine And Related Nausea And Vomiting    Medications:  I have reviewed the patient's current medications. Prior to Admission:  Prescriptions Prior to Admission  Medication Sig Dispense Refill Last Dose  . apixaban (ELIQUIS) 5 MG TABS tablet Take 5 mg by mouth 2 (two) times daily.   10/01/2016 at Unknown time  . fluticasone (FLONASE) 50 MCG/ACT nasal spray Place 2 sprays  into both nostrils daily as needed for rhinitis.    prn at prn  . hydrochlorothiazide (HYDRODIURIL) 12.5 MG tablet Take 12.5 mg by mouth daily.    10/01/2016 at Unknown time  . Multiple Vitamins-Minerals (CENTRUM SILVER PO) Take 1 tablet by mouth daily.   10/01/2016 at Unknown time  . simvastatin (ZOCOR) 20 MG tablet Take 20 mg by mouth every evening.    10/01/2016 at Unknown time  . methimazole (TAPAZOLE) 5 MG tablet Take 1 tablet by mouth daily.   Not Taking at Unknown time   Scheduled: . apixaban  2.5 mg Oral BID  . methimazole  5 mg Oral Daily  . simvastatin  20 mg Oral QPM    ROS: History obtained from the patient  General ROS: negative for - chills, fatigue, fever, night sweats, weight gain or weight loss Psychological ROS: negative for - behavioral disorder, hallucinations, memory difficulties, mood swings or suicidal ideation Ophthalmic ROS: negative for - blurry vision, double vision, eye pain or loss of vision ENT ROS: negative for - epistaxis, nasal discharge, oral lesions, sore throat, tinnitus or vertigo Allergy and Immunology ROS: negative for - hives or itchy/watery eyes Hematological and Lymphatic ROS: negative for - bleeding problems, bruising or swollen lymph nodes Endocrine ROS: negative for - galactorrhea, hair pattern changes, polydipsia/polyuria or temperature intolerance Respiratory ROS: negative for - cough, hemoptysis, shortness of  breath or wheezing Cardiovascular ROS: negative for - chest pain, dyspnea on exertion, edema or irregular heartbeat Gastrointestinal ROS: negative for - abdominal pain, diarrhea, hematemesis, nausea/vomiting or stool incontinence Genito-Urinary ROS: negative for - dysuria, hematuria, incontinence or urinary frequency/urgency Musculoskeletal ROS: arthritic pain Neurological ROS: as noted in HPI Dermatological ROS: negative for rash and skin lesion changes  Physical Examination: Blood pressure 140/72, pulse 67, temperature 97.5 F (36.4  C), temperature source Oral, resp. rate 18, height 5\' 4"  (1.626 m), weight 54.4 kg (120 lb), SpO2 97 %.  HEENT-  Normocephalic, no lesions, without obvious abnormality.  Normal external eye and conjunctiva.  Normal TM's bilaterally.  Normal auditory canals and external ears. Normal external nose, mucus membranes and septum.  Normal pharynx. Cardiovascular- S1, S2 normal, pulses palpable throughout   Lungs- chest clear, no wheezing, rales, normal symmetric air entry Abdomen- soft, non-tender; bowel sounds normal; no masses,  no organomegaly Extremities- LE lymphadema Lymph-no adenopathy palpable Musculoskeletal-no joint tenderness, deformity or swelling Skin-warm and dry, no hyperpigmentation, vitiligo, or suspicious lesions  Neurological Examination Mental Status: Alert, oriented, thought content appropriate.  Speech fluent without evidence of aphasia.  Able to follow 3 step commands without difficulty. Cranial Nerves: II: Discs flat bilaterally; Visual fields grossly normal, pupils equal, round, reactive to light and accommodation III,IV, VI: ptosis not present, extra-ocular motions intact bilaterally V,VII: smile symmetric, facial light touch sensation normal bilaterally VIII: hearing normal bilaterally IX,X: gag reflex present XI: bilateral shoulder shrug XII: midline tongue extension Motor: Right : Upper extremity   5-/5    Left:     Upper extremity   5-/5  Lower extremity   5-/5     Lower extremity   5-/5 Tone and bulk:normal tone throughout; no atrophy noted Sensory: Pinprick and light touch intact throughout, bilaterally Deep Tendon Reflexes: 2+ and symmetric throughout Plantars: Right: downgoing   Left: downgoing Cerebellar: Normal finger-to-nose and normal heel-to-shin testing bilaterally Gait: not tested due to safety concerns      Laboratory Studies:  Basic Metabolic Panel:  Recent Labs Lab 10/01/16 2345  NA 139  K 3.4*  CL 102  CO2 29  GLUCOSE 138*  BUN  30*  CREATININE 1.00  CALCIUM 9.1    Liver Function Tests:  Recent Labs Lab 10/01/16 2345  AST 30  ALT 16  ALKPHOS 153*  BILITOT 0.2*  PROT 6.5  ALBUMIN 3.8   No results for input(s): LIPASE, AMYLASE in the last 168 hours. No results for input(s): AMMONIA in the last 168 hours.  CBC:  Recent Labs Lab 10/01/16 2020  WBC 7.2  NEUTROABS 2.9  HGB 14.8  HCT 44.8  MCV 91.1  PLT 180    Cardiac Enzymes:  Recent Labs Lab 10/01/16 2345  TROPONINI <0.03    BNP: Invalid input(s): POCBNP  CBG: No results for input(s): GLUCAP in the last 168 hours.  Microbiology: No results found for this or any previous visit.  Coagulation Studies:  Recent Labs  10/01/16 2021  LABPROT 15.3*  INR 1.20    Urinalysis: No results for input(s): COLORURINE, LABSPEC, PHURINE, GLUCOSEU, HGBUR, BILIRUBINUR, KETONESUR, PROTEINUR, UROBILINOGEN, NITRITE, LEUKOCYTESUR in the last 168 hours.  Invalid input(s): APPERANCEUR  Lipid Panel:    Component Value Date/Time   CHOL 157 10/02/2016 0416   TRIG 58 10/02/2016 0416   HDL 86 10/02/2016 0416   CHOLHDL 1.8 10/02/2016 0416   VLDL 12 10/02/2016 0416   LDLCALC 59 10/02/2016 0416    HgbA1C:  Lab Results  Component Value Date   HGBA1C 5.7 05/05/2015    Urine Drug Screen:  No results found for: LABOPIA, COCAINSCRNUR, LABBENZ, AMPHETMU, THCU, LABBARB  Alcohol Level: No results for input(s): ETH in the last 168 hours.  Other results: EKG: paced rhythm at 60 bpm.  Imaging: [place] Carotid Bilateral (at [facility] And [facility] Only)  Result Date: 10/02/2016 CLINICAL DATA:  83 year old female with a history of encephalopathy. Cardiovascular risk factors include hypertension EXAM: BILATERAL CAROTID DUPLEX ULTRASOUND TECHNIQUE: Wallace CullensGray scale imaging, color Doppler and duplex ultrasound were performed of bilateral carotid and vertebral arteries in the neck. COMPARISON:  Duplex 09/29/2014 FINDINGS: Criteria: Quantification of carotid stenosis is based on  velocity parameters that correlate the residual internal carotid diameter with NASCET-based stenosis levels, using the diameter of the distal internal carotid lumen as the denominator for stenosis measurement. The following velocity measurements were obtained: RIGHT ICA:  Systolic 75 cm/sec, Diastolic 16 cm/sec CCA:  66 cm/sec SYSTOLIC ICA/CCA RATIO:  1.1 ECA:  100 cm/sec LEFT ICA:  Systolic 74 cm/sec, Diastolic 25 cm/sec CCA:  71 cm/sec SYSTOLIC ICA/CCA RATIO:  1.0 ECA:  80 cm/sec Right Brachial SBP: Not acquired Left Brachial SBP: Not acquired RIGHT CAROTID ARTERY: No significant calcifications of the right common carotid artery. Intermediate waveform maintained. Heterogeneous and partially calcified plaque at the right carotid bifurcation. No significant lumen shadowing. Low resistance waveform of the right ICA. Tortuosity RIGHT VERTEBRAL ARTERY: Antegrade flow with low resistance waveform. LEFT CAROTID ARTERY: No significant calcifications of the left common carotid artery. Intermediate waveform maintained. Heterogeneous and partially calcified plaque at the left carotid bifurcation without significant lumen shadowing. Low resistance waveform of the left ICA. Tortuosity LEFT VERTEBRAL ARTERY:  Antegrade flow with low resistance waveform. IMPRESSION: Color duplex indicates minimal heterogeneous and calcified plaque, with no hemodynamically significant stenosis by duplex criteria in the extracranial cerebrovascular circulation. Signed, Yvone NeuJaime S. Loreta AveWagner, DO [facility] Electronically Signed   By: Gilmer MorJaime  Wagner D.O.   On: 10/02/2016 09:40   Ct Head Code Stroke W/o Cm  Result Date: 10/01/2016 CLINICAL DATA:  Code stroke. Sudden onset weakness and altered mental status. EXAM: CT HEAD WITHOUT CONTRAST TECHNIQUE: Contiguous axial images were obtained from the base of the skull through the vertex without intravenous contrast. COMPARISON:  Head CT 06/05/2016  FINDINGS: Brain: There is no evidence of acute cortical infarct, intracranial hemorrhage, mass, midline shift, or extra-axial fluid collection. A chronic left occipital infarct is unchanged. Cerebral volume is otherwise within normal limits for age. Scattered cerebral white matter hypodensities are similar to the prior study and nonspecific but compatible with very mild chronic small vessel ischemic disease. Vascular: Calcified atherosclerosis at the skullbase. No hyperdense vessel. Skull: No fracture or focal osseous lesion. Sinuses/Orbits: Visualized paranasal sinuses and mastoid air cells are clear. Visualized orbits are unremarkable. Other: None. ASPECTS [facility]([place] Stroke Program Early CT Score) - Ganglionic level infarction (caudate, lentiform nuclei, internal capsule, insula, M1-M3 cortex): 7 - Supraganglionic infarction (M4-M6 cortex): 3 Total score (0-10 with 10 being normal): 10 IMPRESSION: 1. No evidence of acute intracranial abnormality. 2. ASPECTS is 10. 3. Chronic left occipital lobe infarct. These results were called by telephone at the time of interpretation on 10/01/2016 at 8:21 pm to Dr. Willy EddyPATRICK ROBINSON , who verbally acknowledged these results. Electronically Signed   By: Sebastian AcheAllen  Grady M.D.   On: 10/01/2016 20:22    Assessment: 83 y.o. female presenting with an episode of what appears to be aphasia.  Head CT reviewed  and shows no acute changes.  Patient with a history of afib on Eliquis.  Unable to have MRI due to pacer.  Suspect TIA due to small vessel disease but seizure on the differential as well.  Carotid dopplers show no evidence of hemodynamically significant stenosis.  Echocardiogram pending.  A1c pending, LDL 59.  Stroke Risk Factors - atrial fibrillation, hyperlipidemia and hypertension  Plan: 1. EEG 2. PT consult, OT consult, Speech consult 3. Prophylactic therapy-Continue Eliquis.  ASA 81mg  daily. 4. NPO until RN stroke swallow screen 5. Telemetry monitoring 6. Frequent neuro  checks   Thana Farr, MD Neurology [phone] 10/02/2016, 12:22 PM

## 2016-10-02 NOTE — Evaluation (Signed)
Physical Therapy Evaluation Patient Details Name: Colleen DawleyHilda M Loeffler MRN: [id number] DOB: 01-Apr-1925 Today's Date: 10/02/2016   History of Present Illness  Pt is an 83 y.o. female with a history of atrial fibrillation s/p pacer who reports that on yesterday she was with her son-in-law and became acutely confused.  Was unable to determine how to use the remote control.  Could not get her words out.  Was staring into space at times.  With no improvement in symptoms patient was brought in for evaluation by family.  Started to improve en route with episode lasting about one hour.  Initial NIHSS of 9.    Clinical Impression  Pt presents with deficits in transfers, gait, balance, and activity tolerance.  Pt Ind with bed mobility tasks and SBA with transfers.  Pt able to ambulate 2050' with RW with SBA both with RW and without AD with min instability and mild drifting L/R but pt able to self-correct without LOB.  Pt reports feeling both balance and activity tolerance are somewhat decreased from baseline.  Pt will benefit from PT services to address above deficits for decreased caregiver assistance upon discharge.        Follow Up Recommendations Home health PT    Equipment Recommendations  None recommended by PT    Recommendations for Other Services       Precautions / Restrictions Precautions Precautions: Fall Restrictions Weight Bearing Restrictions: No      Mobility  Bed Mobility Overal bed mobility: Independent                Transfers Overall transfer level: Needs assistance Equipment used: Rolling walker (2 wheeled) Transfers: Sit to/from Stand Sit to Stand: Supervision            Ambulation/Gait Ambulation/Gait assistance: Supervision Ambulation Distance (Feet): 50 Feet Assistive device: Rolling walker (2 wheeled);None Gait Pattern/deviations: Decreased step length - right;Decreased step length - left;Drifts right/left   Gait velocity interpretation: Below normal  speed for age/gender General Gait Details: Min instability with and without AD but no LOB; activity tolerance decreased with PLOF being Programmer, multimediacommunity ambulator  Stairs            Wheelchair Mobility    Modified Rankin (Stroke Patients Only)       Balance Overall balance assessment: Needs assistance Sitting-balance support: No upper extremity supported Sitting balance-Leahy Scale: Normal     Standing balance support: No upper extremity supported Standing balance-Leahy Scale: Good Standing balance comment: Static standing balance with feet apart and together with eyes open/closed and head still/head turns; pt required min A to prevent LOB with feet together and eyes closed.                             Pertinent Vitals/Pain Pain Assessment: No/denies pain    Home Living Family/patient expects to be discharged to:: Private residence Living Arrangements: Spouse/significant other;Children Available Help at Discharge: Family;Available 24 hours/day Type of Home: House Home Access: Stairs to enter Entrance Stairs-Rails: Right;Left;Can reach both Entrance Stairs-Number of Steps: 2 Home Layout: Two level;Able to live on main level with bedroom/bathroom Home Equipment: Dan HumphreysWalker - 2 wheels;Walker - 4 wheels;Wheelchair - manual      Prior Function Level of Independence: Independent with assistive device(s)         Comments: Pt ind with Amb without AD in home and with SPC in community, no fall history, Ind with ADLs     Hand Dominance  Dominant Hand: Right    Extremity/Trunk Assessment   Upper Extremity Assessment Upper Extremity Assessment: Overall WFL for tasks assessed    Lower Extremity Assessment Lower Extremity Assessment: Overall WFL for tasks assessed       Communication   Communication: No difficulties  Cognition Arousal/Alertness: Awake/alert Behavior During Therapy: WFL for tasks assessed/performed Overall Cognitive Status: Within Functional  Limits for tasks assessed                      General Comments      Exercises Other Exercises Other Exercises: Static balance training with feet apart and together with combinations of eyes open/closed and head still/head turns.    Assessment/Plan    PT Assessment Patient needs continued PT services  PT Problem List Decreased balance;Decreased activity tolerance          PT Treatment Interventions      PT Goals (Current goals can be found in the Care Plan section)  Acute Rehab PT Goals Patient Stated Goal: To get back home PT Goal Formulation: With patient Time For Goal Achievement: 10/15/16 Potential to Achieve Goals: Good    Frequency Min 2X/week   Barriers to discharge        Co-evaluation               End of Session Equipment Utilized During Treatment: Gait belt Activity Tolerance: Patient limited by fatigue Patient left: in bed;with bed alarm set;with call bell/phone within reach;with family/visitor present Nurse Communication: Mobility status         Time: 1430-1458 PT Time Calculation (min) (ACUTE ONLY): 28 min   Charges:   PT Evaluation $PT Eval Low Complexity: 1 Procedure PT Treatments $Therapeutic Exercise: 8-22 mins   PT G Codes:        DElly Modena PT, DPT 10/02/16, 3:13 PM

## 2016-10-02 NOTE — Progress Notes (Signed)
SLP Cancellation Note  Patient Details Name: GIAMARIE BUECHE MRN: [id number] DOB: August 08, 1925   Cancelled treatment:       Reason Eval/Treat Not Completed: SLP screened, no needs identified, will sign off (reviewed chart notes; consulted NSG and pt/family). Met w/ pt who conversed at conversational level w/ SLP and Daughter in room w/ no overt speech-language deficits noted; pt and Daughter denied any swallowing deficits(pt was eating her breakfast meal w/ no overt deficits noted). ST will sign off w/ NSG to reconsult if any change in status while admitted. Pt agreed.    Orinda Kenner, MS, CCC-SLP Puneet Masoner 10/02/2016, 10:20 AM

## 2016-10-02 NOTE — Progress Notes (Signed)
*  PRELIMINARY RESULTS* Echocardiogram 2D Echocardiogram has been performed.  Cristela BlueHege, Gerron Guidotti 10/02/2016, 9:48 AM

## 2016-10-02 NOTE — Progress Notes (Signed)
Eliquis changed to 2.5 mg BID for age [age] y/o and TBW <60 kg.

## 2016-10-02 NOTE — Plan of Care (Signed)
Patient d/ced home with daughter and son-in-law.  All tests were negative.  She was unable to have an MRI b/c she has a pacemaker.  Patient worked with PT and has some drift.  But she will be going home with Home Health.  Only new medication is a baby aspirin.  Will review stroke education along with d/c instructions.  IV removed and f/u appts reviewed.

## 2016-10-02 NOTE — Progress Notes (Signed)
OT Cancellation Note  Patient Details Name: Lynwood DawleyHilda M Raffel MRN: [id number] DOB: 06-10-1925   Cancelled Treatment:    Reason Eval/Treat Not Completed: Patient at procedure or test/ unavailable. Order received, chart reviewed. Pt out of room for testing. Will attempt at later date/time when pt is available.  Eliezer BottomJamie L Stiller, OTR/L 10/02/2016, 4:22 PM

## 2016-10-02 NOTE — Progress Notes (Signed)
SLP Cancellation Note  Patient Details Name: Colleen DawleyHilda M Carson MRN: [id number] DOB: May 31, 1925   Cancelled treatment:       Reason Eval/Treat Not Completed: Patient at procedure or test/unavailable  Dollene PrimroseSusan G Lesly Joslyn, MS/CCC- SLP  Leandrew KoyanagiAbernathy, [name] 10/02/2016, 9:16 AM

## 2016-10-03 LAB — HEMOGLOBIN A1C
HEMOGLOBIN A1C: 5.9 % — AB (ref 4.8–5.6)
Mean Plasma Glucose: 123 mg/dL

## 2016-10-04 ENCOUNTER — Emergency Department
Admission: EM | Admit: 2016-10-04 | Discharge: 2016-10-04 | Disposition: A | Discharge status: Home / Self Care | Payer: Medicare Other | Attending: Emergency Medicine | Admitting: Emergency Medicine

## 2016-10-04 ENCOUNTER — Encounter: Payer: Self-pay | Admitting: Emergency Medicine

## 2016-10-04 ENCOUNTER — Emergency Department: Payer: Medicare Other

## 2016-10-04 DIAGNOSIS — Z7982 Long term (current) use of aspirin: Secondary | ICD-10-CM | POA: Insufficient documentation

## 2016-10-04 DIAGNOSIS — I480 Paroxysmal atrial fibrillation: Secondary | ICD-10-CM | POA: Insufficient documentation

## 2016-10-04 DIAGNOSIS — R410 Disorientation, unspecified: Secondary | ICD-10-CM | POA: Diagnosis not present

## 2016-10-04 DIAGNOSIS — R531 Weakness: Secondary | ICD-10-CM | POA: Diagnosis present

## 2016-10-04 DIAGNOSIS — I10 Essential (primary) hypertension: Secondary | ICD-10-CM | POA: Diagnosis not present

## 2016-10-04 DIAGNOSIS — Z79899 Other long term (current) drug therapy: Secondary | ICD-10-CM | POA: Insufficient documentation

## 2016-10-04 LAB — URINALYSIS, COMPLETE (UACMP) WITH MICROSCOPIC
BILIRUBIN URINE: NEGATIVE
Bacteria, UA: NONE SEEN
GLUCOSE, UA: NEGATIVE mg/dL
Ketones, ur: NEGATIVE mg/dL
LEUKOCYTES UA: NEGATIVE
NITRITE: NEGATIVE
Protein, ur: NEGATIVE mg/dL
Specific Gravity, Urine: 1.008 (ref 1.005–1.030)
pH: 7 (ref 5.0–8.0)

## 2016-10-04 LAB — CBC
HCT: 43.4 % (ref 35.0–47.0)
HEMOGLOBIN: 14.5 g/dL (ref 12.0–16.0)
MCH: 29.8 pg (ref 26.0–34.0)
MCHC: 33.4 g/dL (ref 32.0–36.0)
MCV: 89.2 fL (ref 80.0–100.0)
Platelets: 181 10*3/uL (ref 150–440)
RBC: 4.87 MIL/uL (ref 3.80–5.20)
RDW: 14.6 % — ABNORMAL HIGH (ref 11.5–14.5)
WBC: 5.7 10*3/uL (ref 3.6–11.0)

## 2016-10-04 LAB — COMPREHENSIVE METABOLIC PANEL
ALT: 20 U/L (ref 14–54)
ANION GAP: 8 (ref 5–15)
AST: 34 U/L (ref 15–41)
Albumin: 4.1 g/dL (ref 3.5–5.0)
Alkaline Phosphatase: 159 U/L — ABNORMAL HIGH (ref 38–126)
BUN: 29 mg/dL — ABNORMAL HIGH (ref 6–20)
CALCIUM: 9.6 mg/dL (ref 8.9–10.3)
CHLORIDE: 100 mmol/L — AB (ref 101–111)
CO2: 30 mmol/L (ref 22–32)
CREATININE: 1.01 mg/dL — AB (ref 0.44–1.00)
GFR, EST AFRICAN AMERICAN: 55 mL/min — AB (ref >60)
GFR, EST NON AFRICAN AMERICAN: 48 mL/min — AB (ref >60)
Glucose, Bld: 213 mg/dL — ABNORMAL HIGH (ref 65–99)
Potassium: 3.4 mmol/L — ABNORMAL LOW (ref 3.5–5.1)
Sodium: 138 mmol/L (ref 135–145)
Total Bilirubin: 0.6 mg/dL (ref 0.3–1.2)
Total Protein: 7.2 g/dL (ref 6.5–8.1)

## 2016-10-04 LAB — TROPONIN I

## 2016-10-04 MED ORDER — METOPROLOL TARTRATE 25 MG PO TABS
25.0000 mg | ORAL_TABLET | Freq: Once | ORAL | Status: AC
  Administered 2016-10-04: 25 mg via ORAL
  Filled 2016-10-04: qty 1

## 2016-10-04 MED ORDER — METOPROLOL TARTRATE 25 MG PO TABS: 25.0000 mg | ORAL_TABLET | Freq: Two times a day (BID) | ORAL | 0 refills | Status: DC

## 2016-10-04 NOTE — ED Provider Notes (Signed)
[facility] Emergency Department Provider Note  ____________________________________________  Time seen: Approximately 1:19 PM  I have reviewed the triage vital signs and the nursing notes.   HISTORY  Chief Complaint Weakness    HPI Colleen Carson is a 83 y.o. female who complains of generalized weakness that started about 30 minutes after waking up this morning. No headache or lateralizing weakness. No vision changes. Reports that her heart rate was in the 40s, blood pressure was okay at home. They called EMS, and patient notes that about 15 minutes prior to arrival at the hospital she started feeling back to normal. Currently has no symptoms. No syncope chest pain or shortness of breath  Has a history of A. fib. Takes aspirin and Eliquis. Has a pacemaker. Cardiologist is Gwen Pounds.  Patient was recently hospitalized for similar symptoms 3 days ago. She has CT scan of the head, ultrasound of carotids, echocardiogram, which were all essentially unremarkable. MRI was deferred due to her pacemaker, she is planning to have an MRI scheduled at [facility] on follow-up. She also has a follow-up appointment on Tuesday in 3 days with the pacemaker clinic.   Past Medical History:  Diagnosis Date  . Arthritis   . Atrial fibrillation (HCC)   . Dysrhythmia    Afib  . Hypercholesteremia   . Hypertension   . Lymphedema    lower extremities  . S/P total hip arthroplasty 01/25/2015  . Shingles   . Stroke [facility])    noted ischemia on CT scan     Patient Active Problem List   Diagnosis Date Noted  . Acute encephalopathy 10/01/2016  . Symptomatic bradycardia 05/15/2016  . HTN (hypertension) 05/15/2016  . HLD (hyperlipidemia) 05/15/2016  . Atrial fibrillation (HCC) 03/11/2016  . AKI (acute kidney injury) (HCC) 05/06/2015  . S/P total hip arthroplasty 01/25/2015  . Primary osteoarthritis of left hip 01/23/2015     Past Surgical History:  Procedure Laterality Date   . JOINT REPLACEMENT  01/23/15   Left THR Dr. Rosita Kea   . PACEMAKER INSERTION  2017  . TOTAL HIP ARTHROPLASTY Left 01/23/2015   Procedure: TOTAL HIP ARTHROPLASTY ANTERIOR APPROACH;  Surgeon: Kennedy Bucker, MD;  Location: [facility] ORS;  Service: Orthopedics;  Laterality: Left;     Prior to Admission medications   Medication Sig Start Date End Date Taking? Authorizing Provider  apixaban (ELIQUIS) 5 MG TABS tablet Take 5 mg by mouth 2 (two) times daily.   Yes Historical Provider, MD  aspirin EC 81 MG tablet Take 1 tablet (81 mg total) by mouth daily. 10/02/16  Yes Srikar Sudini, MD  Multiple Vitamins-Minerals (CENTRUM SILVER PO) Take 1 tablet by mouth daily.   Yes Historical Provider, MD  simvastatin (ZOCOR) 20 MG tablet Take 20 mg by mouth every evening.    Yes Historical Provider, MD  fluticasone (FLONASE) 50 MCG/ACT nasal spray Place 2 sprays into both nostrils daily as needed for rhinitis.     Historical Provider, MD  metoprolol tartrate (LOPRESSOR) 25 MG tablet Take 1 tablet (25 mg total) by mouth 2 (two) times daily. 10/04/16 10/04/17  Sharman Cheek, MD     Allergies Morphine and related   Family History  Problem Relation Age of Onset  . Hypertension Other     Social History Social History  Substance Use Topics  . Smoking status: Never Smoker  . Smokeless tobacco: Never Used  . Alcohol use No    Review of Systems  Constitutional:   No fever or chills.  ENT:   No sore throat. No rhinorrhea. Cardiovascular:   No chest pain. Respiratory:   No dyspnea or cough. Gastrointestinal:   Negative for abdominal pain, vomiting and diarrhea.  Genitourinary:   Negative for dysuria or difficulty urinating. Musculoskeletal:   Negative for focal pain or swelling Neurological:   Negative for headaches 10-point ROS otherwise negative.  ____________________________________________   PHYSICAL EXAM:  VITAL SIGNS: ED Triage Vitals  Enc Vitals Group     BP 10/04/16 1130 (!) 162/78     Pulse  Rate 10/04/16 1130 70     Resp 10/04/16 1130 15     Temp 10/04/16 1134 97.7 F (36.5 C)     Temp Source 10/04/16 1134 Oral     SpO2 10/04/16 1130 96 %     Weight 10/04/16 1134 142 lb (64.4 kg)     Height 10/04/16 1134 4\' 11"  (1.499 m)     Head Circumference --      Peak Flow --      Pain Score --      Pain Loc --      Pain Edu? --      Excl. in GC? --     Vital signs reviewed, nursing assessments reviewed.   Constitutional:   Alert and oriented. Well appearing and in no distress. Eyes:   No scleral icterus. No conjunctival pallor. PERRL. EOMI.  No nystagmus. ENT   Head:   Normocephalic and atraumatic.   Nose:   No congestion/rhinnorhea. No septal hematoma   Mouth/Throat:   MMM, no pharyngeal erythema. No peritonsillar mass.    Neck:   No stridor. No SubQ emphysema. No meningismus. Hematological/Lymphatic/Immunilogical:   No cervical lymphadenopathy. Cardiovascular:   Irregularly irregular rhythm, normal rate. Symmetric bilateral radial and DP pulses.  No murmurs.  Respiratory:   Normal respiratory effort without tachypnea nor retractions. Breath sounds are clear and equal bilaterally. No wheezes/rales/rhonchi. Gastrointestinal:   Soft and nontender. Non distended. There is no CVA tenderness.  No rebound, rigidity, or guarding. Genitourinary:   deferred Musculoskeletal:   Nontender with normal range of motion in all extremities. No joint effusions.  No lower extremity tenderness.  No edema. Neurologic:   Normal speech and language.  CN 2-10 normal. Motor grossly intact. No gross focal neurologic deficits are appreciated.  Skin:    Skin is warm, dry and intact. No rash noted.  No petechiae, purpura, or bullae.  ____________________________________________    LABS (pertinent positives/negatives) (all labs ordered are listed, but only abnormal results are displayed) Labs Reviewed  CBC - Abnormal; Notable for the following:       Result Value   RDW 14.6 (*)     All other components within normal limits  URINALYSIS, COMPLETE (UACMP) WITH MICROSCOPIC - Abnormal; Notable for the following:    Color, Urine STRAW (*)    APPearance CLEAR (*)    Hgb urine dipstick MODERATE (*)    Squamous Epithelial / LPF 0-5 (*)    All other components within normal limits  COMPREHENSIVE METABOLIC PANEL - Abnormal; Notable for the following:    Potassium 3.4 (*)    Chloride 100 (*)    Glucose, Bld 213 (*)    BUN 29 (*)    Creatinine, Ser 1.01 (*)    Alkaline Phosphatase 159 (*)    GFR calc non Af Amer 48 (*)    GFR calc Af Amer 55 (*)    All other components within normal limits  TROPONIN I   ____________________________________________  EKG  Interpreted by me Dual-chamber paced rhythm, rate of 72. Left axis, left bundle branch block. Adequate capture. No acute ischemic changes noted.  ____________________________________________    RADIOLOGY  CT head unremarkable Chest x-ray unremarkable  ____________________________________________   PROCEDURES Procedures  ____________________________________________   INITIAL IMPRESSION / ASSESSMENT AND PLAN / ED COURSE  Pertinent labs & imaging results that were available during my care of the patient were reviewed by me and considered in my medical decision making (see chart for details).  Patient well appearing no acute distress, presents with episode of generalized weakness and feeling somewhat confused at home. She reports that her heart rate was 40 at home. There've been no episodes of bradycardia here in the ED today. Blood pressures remained adequate. Reviewing her recent hospitalization, she had a TTE which was essentially unremarkable. She had ultrasound carotids which was unremarkable. She had pacemaker interrogation which showed appropriate performance of the device.  Today workup is unremarkable. She does not warrant a urgent MRI at this time. I have low suspicion for stroke or infection and I  actually think that this is related to her A. fib as the pacemaker report did reveal that she goes in and out of A. fib on a daily basis. It may be that her pacemaker parameters need to be adjusted to avoid some of the symptoms, and she is following up in pacemaker clinic in 3 days. Presently there is no evidence of associated hemodynamic instability.  We'll recheck pacemaker device today to ensure it is still performing as expected. If so patient should be suitable for discharge home. I had a long discussion with the patient and her daughter at the bedside about what to expect and the importance of follow-up. Return precautions given.    ----------------------------------------- 2:54 PM on 10/04/2016 -----------------------------------------  Pacemaker interrogation performed. Discussed with [employer] representative. Patient is having frequent atrial tachycardia with heart rate of 140-150, occurring about 14 times in the last 48 hours. Yesterday she was in abnormal rhythm from 6 or 7 hours of the day. I think this explains her symptoms. I discussed with Dr. Gwen Pounds he notes that she may benefit from some pacemaker programming adjustment, but first because of her medication regimen he recommended discontinuing HCTZ and starting metoprolol twice a day. They can then reassess on Monday or Tuesday to determine if she should have any changes to her pacemaker.  I had a long conversation with the patient and her family about all this and recommendations. She does have the ability to check the pacemaker device directly at home and I recommended that if she suspects the patient is having an unusual heart rate or rhythm, she should interact with the device using her equipment instead of relying on a forearm automated blood pressure cuff. All questions and concerns were addressed to the best of my ability.   Clinical Course    ____________________________________________   FINAL CLINICAL IMPRESSION(S) /  ED DIAGNOSES  Final diagnoses:  Paroxysmal a-fib (HCC)  Generalized weakness      New Prescriptions   METOPROLOL TARTRATE (LOPRESSOR) 25 MG TABLET    Take 1 tablet (25 mg total) by mouth 2 (two) times daily.     Portions of this note were generated with dragon dictation software. Dictation errors may occur despite best attempts at proofreading.    Sharman Cheek, MD 10/04/16 [phone]

## 2016-10-04 NOTE — ED Notes (Signed)
This RN to bedside to go over D/C instructions with patient and daughter. Pt's daughter had multiple questions for this RN, this RN offered for MD to come back to bedside to answer questions at this time.

## 2016-10-04 NOTE — ED Notes (Signed)
This RN assisted patient to the bathroom. Pt tolerated well. Urine sample obtained at this time.

## 2016-10-04 NOTE — ED Notes (Signed)
This RN to bedside at this time to interrogate pt's pacemaker. Pt tolerated well at this time. Will continue to monitor for further patient needs.

## 2016-10-04 NOTE — ED Notes (Signed)
Pt visualized in NAD at this time. Pt's daughter and Dr. Scotty CourtStafford at bedside at this time. Will continue to monitor for further patient needs.

## 2016-10-04 NOTE — ED Notes (Signed)
NAD noted at time of D/C. Pt and daughter deny any questions at this time. This RN and MD note pt's daughter's concerns regarding BP, MD is aware and states patient is okay for D/C at this time. Pt taken to the lobby via wheelchair at this time.

## 2016-10-04 NOTE — ED Notes (Signed)
Dr. Stafford at bedside at this time.  

## 2016-10-04 NOTE — ED Triage Notes (Signed)
Pt presents to ED via [facility]. Per EMS pt was seen here on Wednesday and D/C on Thursday for TIA. Pt presents to ED with c/o generalized weakness and fatigue since this AM, pt states symptoms started approx 30 mins after she woke up. EMS also reports that per her family, pt's HR was in the 40's. Pt states she is "feeling better". No slurred speech, no hemiparesis noted at this time. Pt is alert and oriented at this time.

## 2016-10-04 NOTE — ED Notes (Signed)
Dr. Scotty CourtStafford and this RN at bedside at this time to answer patient's daughter's questions. Pt states that she "feels fine". Pt is alert and oriented at this time.

## 2016-10-07 ENCOUNTER — Other Ambulatory Visit: Payer: Self-pay | Admitting: Cardiology

## 2016-10-07 DIAGNOSIS — G459 Transient cerebral ischemic attack, unspecified: Secondary | ICD-10-CM

## 2016-10-08 NOTE — Discharge Summary (Signed)
[facility] [place] [facility]   PATIENT NAME: Colleen Carson    MR#:  [id number]  DATE OF BIRTH:  Jan 28, 1925  DATE OF ADMISSION:  10/01/2016 ADMITTING PHYSICIAN: Oralia Manis, MD  DATE OF DISCHARGE: 10/02/2016  7:00 PM  PRIMARY CARE PHYSICIAN: [name], [name], MD   ADMISSION DIAGNOSIS:  Acute encephalopathy [G93.40]  DISCHARGE DIAGNOSIS:  Principal Problem:   Acute encephalopathy Active Problems:   Atrial fibrillation (HCC)   HTN (hypertension)   HLD (hyperlipidemia)   SECONDARY DIAGNOSIS:   Past Medical History:  Diagnosis Date  . Arthritis   . Atrial fibrillation (HCC)   . Dysrhythmia    Afib  . Hypercholesteremia   . Hypertension   . Lymphedema    lower extremities  . S/P total hip arthroplasty 01/25/2015  . Shingles   . Stroke [facility])    noted ischemia on CT scan     ADMITTING HISTORY  HISTORY OF PRESENT ILLNESS:  Colleen Carson  is a 83 y.o. female who presents with An episode of acute encephalopathy. She was in her home and her son found her standing in the living room, not looking at TV, staring off, and his was very preoccupied that she could not figure out how to use the remote. After about 5 minutes of this interaction they brought her to the ED for evaluation. This occurred at about 8 PM today. Workup here was largely negative upfront, however she did have some persistent confusion which slowly cleared here in the ED. Telemetry neurology saw the patient and recommended admission and workup. Hospitalists were called for the same.   HOSPITAL COURSE:   * TIA Patient had acute altered mental status and some speech difficulties. The symptoms resolved on their own. A CT scan of the head checked in the emergency room showed nothing acute. An MRI could not be done due to a pacemaker. Echocardiogram and carotid Doppler showed nothing acute. Patient already on anticoagulation for her atrial fibrillation. After consult with Dr. Thad Ranger of neurology  she suggested patient be started on a baby aspirin. An EEG was done which showed no abnormalities. Patient also on statin.  Home health physical therapy set up after physical therapy evaluation in the hospital.  Follow-up with primary care physician as outpatient.   CONSULTS OBTAINED:  Treatment Team:  Thana Farr, MD Lamar Blinks, MD  DRUG ALLERGIES:   Allergies  Allergen Reactions  . Morphine And Related Nausea And Vomiting    DISCHARGE MEDICATIONS:   Discharge Medication List as of 10/02/2016  6:18 PM    START taking these medications   Details  aspirin EC 81 MG tablet Take 1 tablet (81 mg total) by mouth daily., Starting Thu 10/02/2016, No Print      CONTINUE these medications which have NOT CHANGED   Details  apixaban (ELIQUIS) 5 MG TABS tablet Take 5 mg by mouth 2 (two) times daily., Historical Med    fluticasone (FLONASE) 50 MCG/ACT nasal spray Place 2 sprays into both nostrils daily as needed for rhinitis. , Historical Med    Multiple Vitamins-Minerals (CENTRUM SILVER PO) Take 1 tablet by mouth daily., Historical Med    simvastatin (ZOCOR) 20 MG tablet Take 20 mg by mouth every evening. , Historical Med    hydrochlorothiazide (HYDRODIURIL) 12.5 MG tablet Take 12.5 mg by mouth daily. , Historical Med      STOP taking these medications     methimazole (TAPAZOLE) 5 MG tablet  Today   VITAL SIGNS:  Blood pressure (!) 145/69, pulse 83, temperature 98.2 F (36.8 C), resp. rate 18, height 5\' 4"  (1.626 m), weight 54.4 kg (120 lb), SpO2 97 %.  I/O:  No intake or output data in the 24 hours ending 10/08/16 1634  PHYSICAL EXAMINATION:  Physical Exam  GENERAL:  83 y.o.-year-old patient lying in the bed with no acute distress.  LUNGS: Normal breath sounds bilaterally, no wheezing, rales,rhonchi or crepitation. No use of accessory muscles of respiration.  CARDIOVASCULAR: S1, S2 normal. No murmurs, rubs, or gallops.  ABDOMEN: Soft, non-tender,  non-distended. Bowel sounds present. No organomegaly or mass.  NEUROLOGIC: Moves all 4 extremities. PSYCHIATRIC: The patient is alert and oriented x 3.  SKIN: No obvious rash, lesion, or ulcer.   DATA REVIEW:   CBC  Recent Labs Lab 10/04/16 1138  WBC 5.7  HGB 14.5  HCT 43.4  PLT 181    Chemistries   Recent Labs Lab 10/04/16 1138  NA 138  K 3.4*  CL 100*  CO2 30  GLUCOSE 213*  BUN 29*  CREATININE 1.01*  CALCIUM 9.6  AST 34  ALT 20  ALKPHOS 159*  BILITOT 0.6    Cardiac Enzymes  Recent Labs Lab 10/04/16 1138  TROPONINI <0.03    Microbiology Results  No results found for this or any previous visit.  RADIOLOGY:  No results found.  Follow up with PCP in 1 week.  Management plans discussed with the patient, family and they are in agreement.  CODE STATUS:  Code Status History    Date Active Date Inactive Code Status Order ID Comments User Context   10/02/2016 12:41 AM 10/02/2016 10:12 PM DNR [id number]  Oralia Manisavid Willis, MD Inpatient   05/15/2016  2:19 AM 05/15/2016  3:19 PM DNR [id number]  Oralia Manisavid Willis, MD Inpatient   03/11/2016  8:39 AM 03/13/2016  4:09 PM DNR [id number]  Wyatt Hasteavid K Hower, MD ED   03/11/2016  8:39 AM 03/11/2016  8:39 AM Full Code [id number]  Wyatt Hasteavid K Hower, MD ED   05/06/2015  3:01 AM 05/07/2015  3:02 PM DNR [id number]  Arnaldo NatalMichael S Diamond, MD Inpatient   05/06/2015  1:57 AM 05/06/2015  3:01 AM Full Code [id number]  Arnaldo NatalMichael S Diamond, MD ED   01/23/2015 11:21 AM 01/26/2015  4:25 PM Full Code [id number]  Kennedy BuckerMichael Menz, MD Inpatient    Questions for Most Recent Historical Code Status (Order [id number])    Question Answer Comment   In the event of cardiac or respiratory ARREST Do not call a "code blue"    In the event of cardiac or respiratory ARREST Do not perform Intubation, CPR, defibrillation or ACLS    In the event of cardiac or respiratory ARREST Use medication by any route, position, wound care, and other measures to relive pain and suffering. May use oxygen, suction  and manual treatment of airway obstruction as needed for comfort.       TOTAL TIME TAKING CARE OF THIS PATIENT ON DAY OF DISCHARGE: more than 30 minutes.   Milagros LollSudini, Zoya Sprecher R M.D on 10/08/2016 at 4:34 PM  Between 7am to 6pm - Pager - [phone]  After 6pm go to [email or web address] - password [email or web address] [email or web address]  [facility] [place] [facility]  Office  [phone]  CC: Primary care physician; [name], Lavada MesiMARC E, MD  Note: This dictation was prepared with Dragon dictation along with smaller phrase technology. Any transcriptional errors that result from this process are unintentional.

## 2016-10-15 ENCOUNTER — Ambulatory Visit (HOSPITAL_COMMUNITY): Admission: RE | Admit: 2016-10-15 | Payer: Medicare Other | Source: Ambulatory Visit

## 2016-12-24 ENCOUNTER — Observation Stay: Payer: Medicare Other

## 2016-12-24 ENCOUNTER — Observation Stay
Admission: EM | Admit: 2016-12-24 | Discharge: 2016-12-25 | Disposition: A | Discharge status: Home / Self Care | Payer: Medicare Other | Attending: Internal Medicine | Admitting: Internal Medicine

## 2016-12-24 ENCOUNTER — Encounter: Payer: Self-pay | Admitting: *Deleted

## 2016-12-24 ENCOUNTER — Emergency Department: Payer: Medicare Other

## 2016-12-24 DIAGNOSIS — I773 Arterial fibromuscular dysplasia: Secondary | ICD-10-CM | POA: Diagnosis not present

## 2016-12-24 DIAGNOSIS — I10 Essential (primary) hypertension: Secondary | ICD-10-CM | POA: Diagnosis not present

## 2016-12-24 DIAGNOSIS — Z885 Allergy status to narcotic agent status: Secondary | ICD-10-CM | POA: Diagnosis not present

## 2016-12-24 DIAGNOSIS — Z96642 Presence of left artificial hip joint: Secondary | ICD-10-CM | POA: Insufficient documentation

## 2016-12-24 DIAGNOSIS — R109 Unspecified abdominal pain: Secondary | ICD-10-CM | POA: Diagnosis present

## 2016-12-24 DIAGNOSIS — R001 Bradycardia, unspecified: Secondary | ICD-10-CM | POA: Insufficient documentation

## 2016-12-24 DIAGNOSIS — E785 Hyperlipidemia, unspecified: Secondary | ICD-10-CM | POA: Insufficient documentation

## 2016-12-24 DIAGNOSIS — I890 Lymphedema, not elsewhere classified: Secondary | ICD-10-CM | POA: Diagnosis not present

## 2016-12-24 DIAGNOSIS — E041 Nontoxic single thyroid nodule: Secondary | ICD-10-CM | POA: Insufficient documentation

## 2016-12-24 DIAGNOSIS — G459 Transient cerebral ischemic attack, unspecified: Principal | ICD-10-CM

## 2016-12-24 DIAGNOSIS — E890 Postprocedural hypothyroidism: Secondary | ICD-10-CM | POA: Diagnosis not present

## 2016-12-24 DIAGNOSIS — E7800 Pure hypercholesterolemia, unspecified: Secondary | ICD-10-CM | POA: Insufficient documentation

## 2016-12-24 DIAGNOSIS — G9340 Encephalopathy, unspecified: Secondary | ICD-10-CM | POA: Insufficient documentation

## 2016-12-24 DIAGNOSIS — Z950 Presence of cardiac pacemaker: Secondary | ICD-10-CM | POA: Insufficient documentation

## 2016-12-24 DIAGNOSIS — R4781 Slurred speech: Secondary | ICD-10-CM | POA: Insufficient documentation

## 2016-12-24 DIAGNOSIS — I4891 Unspecified atrial fibrillation: Secondary | ICD-10-CM | POA: Insufficient documentation

## 2016-12-24 DIAGNOSIS — Z8673 Personal history of transient ischemic attack (TIA), and cerebral infarction without residual deficits: Secondary | ICD-10-CM | POA: Diagnosis not present

## 2016-12-24 DIAGNOSIS — N179 Acute kidney failure, unspecified: Secondary | ICD-10-CM | POA: Diagnosis not present

## 2016-12-24 DIAGNOSIS — Z7982 Long term (current) use of aspirin: Secondary | ICD-10-CM | POA: Diagnosis not present

## 2016-12-24 DIAGNOSIS — M50322 Other cervical disc degeneration at C5-C6 level: Secondary | ICD-10-CM | POA: Insufficient documentation

## 2016-12-24 DIAGNOSIS — Z7901 Long term (current) use of anticoagulants: Secondary | ICD-10-CM | POA: Insufficient documentation

## 2016-12-24 DIAGNOSIS — Z8249 Family history of ischemic heart disease and other diseases of the circulatory system: Secondary | ICD-10-CM | POA: Insufficient documentation

## 2016-12-24 DIAGNOSIS — M1612 Unilateral primary osteoarthritis, left hip: Secondary | ICD-10-CM | POA: Diagnosis not present

## 2016-12-24 DIAGNOSIS — Z66 Do not resuscitate: Secondary | ICD-10-CM | POA: Diagnosis not present

## 2016-12-24 DIAGNOSIS — M8580 Other specified disorders of bone density and structure, unspecified site: Secondary | ICD-10-CM | POA: Insufficient documentation

## 2016-12-24 LAB — URINALYSIS, COMPLETE (UACMP) WITH MICROSCOPIC
Bilirubin Urine: NEGATIVE
Glucose, UA: NEGATIVE mg/dL
Hgb urine dipstick: NEGATIVE
KETONES UR: NEGATIVE mg/dL
Leukocytes, UA: NEGATIVE
Nitrite: NEGATIVE
PH: 7 (ref 5.0–8.0)
PROTEIN: NEGATIVE mg/dL
Specific Gravity, Urine: 1.005 (ref 1.005–1.030)
Squamous Epithelial / HPF: NONE SEEN

## 2016-12-24 LAB — DIFFERENTIAL
BASOS ABS: 0.1 10*3/uL (ref 0–0.1)
BASOS PCT: 1 %
Eosinophils Absolute: 0.1 10*3/uL (ref 0–0.7)
Eosinophils Relative: 2 %
LYMPHS ABS: 2.7 10*3/uL (ref 1.0–3.6)
Lymphocytes Relative: 38 %
MONOS PCT: 7 %
Monocytes Absolute: 0.5 10*3/uL (ref 0.2–0.9)
NEUTROS ABS: 3.6 10*3/uL (ref 1.4–6.5)
Neutrophils Relative %: 52 %

## 2016-12-24 LAB — APTT: APTT: 34 s (ref 24–36)

## 2016-12-24 LAB — CBC
HEMATOCRIT: 42.5 % (ref 35.0–47.0)
HEMOGLOBIN: 14.2 g/dL (ref 12.0–16.0)
MCH: 29.6 pg (ref 26.0–34.0)
MCHC: 33.4 g/dL (ref 32.0–36.0)
MCV: 88.8 fL (ref 80.0–100.0)
Platelets: 160 10*3/uL (ref 150–440)
RBC: 4.78 MIL/uL (ref 3.80–5.20)
RDW: 14.5 % (ref 11.5–14.5)
WBC: 7 10*3/uL (ref 3.6–11.0)

## 2016-12-24 LAB — COMPREHENSIVE METABOLIC PANEL
ALBUMIN: 4.1 g/dL (ref 3.5–5.0)
ALT: 19 U/L (ref 14–54)
AST: 36 U/L (ref 15–41)
Alkaline Phosphatase: 158 U/L — ABNORMAL HIGH (ref 38–126)
Anion gap: 9 (ref 5–15)
BILIRUBIN TOTAL: 0.7 mg/dL (ref 0.3–1.2)
BUN: 22 mg/dL — AB (ref 6–20)
CHLORIDE: 103 mmol/L (ref 101–111)
CO2: 26 mmol/L (ref 22–32)
CREATININE: 1.01 mg/dL — AB (ref 0.44–1.00)
Calcium: 9.5 mg/dL (ref 8.9–10.3)
GFR calc Af Amer: 55 mL/min — ABNORMAL LOW (ref >60)
GFR, EST NON AFRICAN AMERICAN: 48 mL/min — AB (ref >60)
GLUCOSE: 165 mg/dL — AB (ref 65–99)
Potassium: 3.9 mmol/L (ref 3.5–5.1)
Sodium: 138 mmol/L (ref 135–145)
Total Protein: 7 g/dL (ref 6.5–8.1)

## 2016-12-24 LAB — TROPONIN I

## 2016-12-24 LAB — GLUCOSE, CAPILLARY: Glucose-Capillary: 174 mg/dL — ABNORMAL HIGH (ref 65–99)

## 2016-12-24 LAB — PROTIME-INR
INR: 0.96
Prothrombin Time: 12.8 seconds (ref 11.4–15.2)

## 2016-12-24 MED ORDER — HYDRALAZINE HCL 20 MG/ML IJ SOLN
10.0000 mg | Freq: Four times a day (QID) | INTRAMUSCULAR | Status: DC | PRN
  Administered 2016-12-24: 10 mg via INTRAVENOUS
  Filled 2016-12-24: qty 1

## 2016-12-24 MED ORDER — ACETAMINOPHEN 160 MG/5ML PO SOLN: 650.0000 mg | ORAL | Status: DC | PRN

## 2016-12-24 MED ORDER — ACETAMINOPHEN 325 MG PO TABS
650.0000 mg | ORAL_TABLET | ORAL | Status: DC | PRN
  Administered 2016-12-24: 650 mg via ORAL
  Filled 2016-12-24: qty 2

## 2016-12-24 MED ORDER — FLUTICASONE PROPIONATE 50 MCG/ACT NA SUSP
2.0000 | Freq: Every day | NASAL | Status: DC | PRN
  Filled 2016-12-24: qty 16

## 2016-12-24 MED ORDER — SIMVASTATIN 20 MG PO TABS
20.0000 mg | ORAL_TABLET | Freq: Every evening | ORAL | Status: DC
  Administered 2016-12-24: 18:00:00 20 mg via ORAL
  Filled 2016-12-24: qty 1

## 2016-12-24 MED ORDER — STROKE: EARLY STAGES OF RECOVERY BOOK
Freq: Once | Status: AC
  Administered 2016-12-24: 18:00:00

## 2016-12-24 MED ORDER — TRAMADOL HCL 50 MG PO TABS
50.0000 mg | ORAL_TABLET | Freq: Two times a day (BID) | ORAL | Status: DC | PRN
  Administered 2016-12-24: 50 mg via ORAL
  Filled 2016-12-24: qty 1

## 2016-12-24 MED ORDER — IOPAMIDOL (ISOVUE-370) INJECTION 76%
75.0000 mL | Freq: Once | INTRAVENOUS | Status: AC | PRN
  Administered 2016-12-24: 75 mL via INTRAVENOUS

## 2016-12-24 MED ORDER — SENNOSIDES-DOCUSATE SODIUM 8.6-50 MG PO TABS: 1.0000 | ORAL_TABLET | Freq: Every evening | ORAL | Status: DC | PRN

## 2016-12-24 MED ORDER — METOPROLOL TARTRATE 25 MG PO TABS
25.0000 mg | ORAL_TABLET | Freq: Two times a day (BID) | ORAL | Status: DC
  Administered 2016-12-24 – 2016-12-25 (×3): 25 mg via ORAL
  Filled 2016-12-24 (×3): qty 1

## 2016-12-24 MED ORDER — ACETAMINOPHEN 650 MG RE SUPP: 650.0000 mg | RECTAL | Status: DC | PRN

## 2016-12-24 MED ORDER — ADULT MULTIVITAMIN W/MINERALS CH
1.0000 | ORAL_TABLET | Freq: Every day | ORAL | Status: DC
  Administered 2016-12-24 – 2016-12-25 (×2): 1 via ORAL
  Filled 2016-12-24 (×2): qty 1

## 2016-12-24 MED ORDER — HYDROCHLOROTHIAZIDE 25 MG PO TABS
12.5000 mg | ORAL_TABLET | Freq: Every day | ORAL | Status: DC
  Administered 2016-12-24 – 2016-12-25 (×2): 12.5 mg via ORAL
  Filled 2016-12-24 (×2): qty 1

## 2016-12-24 MED ORDER — ASPIRIN EC 81 MG PO TBEC
81.0000 mg | DELAYED_RELEASE_TABLET | Freq: Every day | ORAL | Status: DC
  Administered 2016-12-24 – 2016-12-25 (×2): 81 mg via ORAL
  Filled 2016-12-24 (×2): qty 1

## 2016-12-24 MED ORDER — APIXABAN 5 MG PO TABS
5.0000 mg | ORAL_TABLET | Freq: Two times a day (BID) | ORAL | Status: DC
  Administered 2016-12-24 – 2016-12-25 (×2): 5 mg via ORAL
  Filled 2016-12-24 (×3): qty 1

## 2016-12-24 NOTE — Consult Note (Signed)
Referring Physician: Shaune Pollack    Chief Complaint: Difficulty with speech  HPI: Colleen Carson is an 83 y.o. female with a history of afib on Eliquis and ASA, s/p pacer, who was with her daughter today and had acute onset of abdominal pain and difficulty with speech.  Initially her speech was slurred but progressed to the point that she was not responding at all.  Patient was brought in for evaluation.  Initial NIHSS of 4.  Patient improved back to baseline while in the ED.    Patient admitted in January with the same complaints.    Date last known well: Date: 12/24/2016 Time last known well: Time: 09:30 tPA Given: No: On Eliquis, improvement in symptoms  Past Medical History:  Diagnosis Date  . Arthritis   . Atrial fibrillation (HCC)   . Dysrhythmia    Afib  . Hypercholesteremia   . Hypertension   . Lymphedema    lower extremities  . S/P total hip arthroplasty 01/25/2015  . Shingles   . Stroke [facility])    noted ischemia on CT scan    Past Surgical History:  Procedure Laterality Date  . JOINT REPLACEMENT  01/23/15   Left THR Dr. Rosita Kea   . PACEMAKER INSERTION  2017  . TOTAL HIP ARTHROPLASTY Left 01/23/2015   Procedure: TOTAL HIP ARTHROPLASTY ANTERIOR APPROACH;  Surgeon: Kennedy Bucker, MD;  Location: [facility] ORS;  Service: Orthopedics;  Laterality: Left;    Family History  Problem Relation Age of Onset  . Hypertension Other    Social History:  reports that she has never smoked. She has never used smokeless tobacco. She reports that she does not drink alcohol or use drugs.  Allergies:  Allergies  Allergen Reactions  . Morphine And Related Nausea And Vomiting    Medications: I have reviewed the patient's current medications. Prior to Admission:  Prior to Admission medications   Medication Sig Start Date End Date Taking? Authorizing Provider  apixaban (ELIQUIS) 5 MG TABS tablet Take 5 mg by mouth 2 (two) times daily.   Yes Historical Provider, MD  aspirin EC 81 MG tablet Take 1 tablet  (81 mg total) by mouth daily. 10/02/16  Yes Srikar Sudini, MD  fluticasone (FLONASE) 50 MCG/ACT nasal spray Place 2 sprays into both nostrils daily as needed for rhinitis.    Yes Historical Provider, MD  hydrochlorothiazide (HYDRODIURIL) 12.5 MG tablet Take 12.5 mg by mouth daily. 10/02/16  Yes Historical Provider, MD  metoprolol tartrate (LOPRESSOR) 25 MG tablet Take 1 tablet (25 mg total) by mouth 2 (two) times daily. 10/04/16 10/04/17 Yes Sharman Cheek, MD  Multiple Vitamins-Minerals (CENTRUM SILVER PO) Take 1 tablet by mouth daily.   Yes Historical Provider, MD  simvastatin (ZOCOR) 20 MG tablet Take 20 mg by mouth every evening.    Yes Historical Provider, MD     ROS: History obtained from the patient  General ROS: negative for - chills, fatigue, fever, night sweats, weight gain or weight loss Psychological ROS: negative for - behavioral disorder, hallucinations, memory difficulties, mood swings or suicidal ideation Ophthalmic ROS: negative for - blurry vision, double vision, eye pain or loss of vision ENT ROS: negative for - epistaxis, nasal discharge, oral lesions, sore throat, tinnitus or vertigo Allergy and Immunology ROS: negative for - hives or itchy/watery eyes Hematological and Lymphatic ROS: negative for - bleeding problems, bruising or swollen lymph nodes Endocrine ROS: negative for - galactorrhea, hair pattern changes, polydipsia/polyuria or temperature intolerance Respiratory ROS: negative for - cough, hemoptysis,  shortness of breath or wheezing Cardiovascular ROS: negative for - chest pain, dyspnea on exertion, edema or irregular heartbeat Gastrointestinal ROS: abdominal pain Genito-Urinary ROS: negative for - dysuria, hematuria, incontinence or urinary frequency/urgency Musculoskeletal ROS: negative for - joint swelling or muscular weakness Neurological ROS: as noted in HPI Dermatological ROS: negative for rash and skin lesion changes  Physical Examination: Blood pressure  (!) 161/85, pulse 96, temperature 97.8 F (36.6 C), temperature source Oral, resp. rate 18, height  (1.499 m), weight 68 kg (150 lb), SpO2 100 %.  HEENT-  Normocephalic, no lesions, without obvious abnormality.  Normal external eye and conjunctiva.  Normal TM's bilaterally.  Normal auditory canals and external ears. Normal external nose, mucus membranes and septum.  Normal pharynx. Cardiovascular- S1, S2 normal, pulses palpable throughout   Lungs- chest clear, no wheezing, rales, normal symmetric air entry Abdomen- soft, non-tender; bowel sounds normal; no masses,  no organomegaly Extremities- LE edema Lymph-no adenopathy palpable Musculoskeletal-no joint tenderness, deformity or swelling Skin-warm and dry, no hyperpigmentation, vitiligo, or suspicious lesions  Neurological Examination   Mental Status: Alert, oriented, thought content appropriate.  Speech fluent without evidence of aphasia.  Able to follow 3 step commands without difficulty. Cranial Nerves: II: Discs flat bilaterally; Visual fields grossly normal, pupils equal, round, reactive to light and accommodation III,IV, VI: ptosis not present, extra-ocular motions intact bilaterally V,VII: mild decrease right NLF, facial light touch sensation normal bilaterally VIII: hearing normal bilaterally IX,X: gag reflex present XI: bilateral shoulder shrug XII: midline tongue extension Motor: Generalized weakness with no focal weakness noted.  Able to lift and maintain both arms against gravity.  Allows lower extremities to fall to bed but able to perform heel to shin Sensory: Pinprick and light touch intact throughout, bilaterally Deep Tendon Reflexes: 2+ and symmetric throughout Plantars: Right: downgoing   Left: downgoing Cerebellar: Normal finger-to-nose and normal heel-to-shin testing bilaterally Gait: not tested due to safety concerns    Laboratory Studies:  Basic Metabolic Panel:  Recent Labs Lab 12/24/16 1014  NA  138  K 3.9  CL 103  CO2 26  GLUCOSE 165*  BUN 22*  CREATININE 1.01*  CALCIUM 9.5    Liver Function Tests:  Recent Labs Lab 12/24/16 1014  AST 36  ALT 19  ALKPHOS 158*  BILITOT 0.7  PROT 7.0  ALBUMIN 4.1   No results for input(s): LIPASE, AMYLASE in the last 168 hours. No results for input(s): AMMONIA in the last 168 hours.  CBC:  Recent Labs Lab 12/24/16 1014  WBC 7.0  NEUTROABS 3.6  HGB 14.2  HCT 42.5  MCV 88.8  PLT 160    Cardiac Enzymes:  Recent Labs Lab 12/24/16 1014  TROPONINI <0.03    BNP: Invalid input(s): POCBNP  CBG:  Recent Labs Lab 12/24/16 1031  GLUCAP 174*    Microbiology: No results found for this or any previous visit.  Coagulation Studies:  Recent Labs  12/24/16 1014  LABPROT 12.8  INR 0.96    Urinalysis: No results for input(s): COLORURINE, LABSPEC, PHURINE, GLUCOSEU, HGBUR, BILIRUBINUR, KETONESUR, PROTEINUR, UROBILINOGEN, NITRITE, LEUKOCYTESUR in the last 168 hours.  Invalid input(s): APPERANCEUR  Lipid Panel:    Component Value Date/Time   CHOL 157 10/02/2016 0416   TRIG 58 10/02/2016 0416   HDL 86 10/02/2016 0416   CHOLHDL 1.8 10/02/2016 0416   VLDL 12 10/02/2016 0416   LDLCALC 59 10/02/2016 0416    HgbA1C:  Lab Results  Component Value Date   HGBA1C 5.9 (H)  10/02/2016    Urine Drug Screen:  No results found for: LABOPIA, COCAINSCRNUR, LABBENZ, AMPHETMU, THCU, LABBARB  Alcohol Level: No results for input(s): ETH in the last 168 hours.  Other results: EKG: paced rhythm  Imaging: Ct Head Code Stroke W/o Cm  Result Date: 12/24/2016 CLINICAL DATA:  Code stroke.  Altered mental status EXAM: CT HEAD WITHOUT CONTRAST TECHNIQUE: Contiguous axial images were obtained from the base of the skull through the vertex without intravenous contrast. COMPARISON:  CT head 10/04/2016 FINDINGS: Brain: Ventricle size normal. Cerebral volume normal for age. Chronic infarct occipital lobe bilaterally left greater than right  unchanged from the prior study. Chronic white matter infarct in the left frontal lobe also unchanged. Negative for acute infarct, hemorrhage, or mass lesion. Vascular: Negative for hyperdense vessel. Skull: Negative Sinuses/Orbits: Negative Other: None ASPECTS ([place] Stroke Program Early CT Score) - Ganglionic level infarction (caudate, lentiform nuclei, internal capsule, insula, M1-M3 cortex): 7 - Supraganglionic infarction (M4-M6 cortex): 3 Total score (0-10 with 10 being normal): 10 IMPRESSION: 1. No acute intracranial abnormality. Chronic ischemic changes as above. 2. ASPECTS is 10 These results were called by telephone at the time of interpretation on 12/24/2016 at 10:30 am to Dr. Shaune Pollack, who verbally acknowledged these results. Electronically Signed   By: Marlan Palau M.D.   On: 12/24/2016 10:31    Assessment: 83 y.o. female with a history of atrial fibrillation on Eliquis, s/p pacer who presents after an episode of slurred speech.  Patient returned to baseline and had another while in the ED.  Work up including EEG was unremarkable.  Although TIA is in the differential patient on maximum medical therapy.  Not an intervention candidate.  Seizure remains on the differential as well and can repeat EEG to increase sensitivity.   Head CT from today reviewed and shows no acute changes.   Stroke Risk Factors - atrial fibrillation, hyperlipidemia and hypertension  Plan: 1. EEG 2. NPO until RN stroke swallow screen 3. Telemetry monitoring 4. Frequent neuro checks 5. Continue ASA and Eliquis  Case discussed with Dr. Zenaida Deed, MD Neurology [phone] 12/24/2016, 11:21 AM

## 2016-12-24 NOTE — ED Notes (Addendum)
Per daughter pt behavior was okay this AM, states they were driving and at 4098 she began to have trouble speaking, words did not make sense, slurred, pt on eliquis with hx of afib in triage pt will not open eyes, oriented to place but states it is Monday, speech clear at present

## 2016-12-24 NOTE — ED Provider Notes (Signed)
[facility] Emergency Department Provider Note ____________________________________________   I have reviewed the triage vital signs and the triage nursing note.  HISTORY  Chief Complaint Code Stroke   Historian Patient Son and Daughter in law  HPI Colleen Carson is a 83 y.o. female with history of afib, av pacer, lymphedema, and prior tia and stroke on eliquis, presents today for acute aphasia and slurred speech, onset  9:50 am.  Patient lives with daughter in law, was reportedly normal this morning upon waking and dressed herself and was talking. They were in the car at the [place] when she had acute onset of difficulty finding words, and then slurred speech or garbled speech. Symptoms persisted until she got to the ER which case she was starting to improve somewhat. Her blood pressure is initially quite elevated.  Family states that she has had some episodes somewhat similar to this in the past which were found to  Be due to A. Fib with rapid ventricular response. She's also had TIA in the past.  No focal weakness or numbness.    Past Medical History:  Diagnosis Date  . Arthritis   . Atrial fibrillation (HCC)   . Dysrhythmia    Afib  . Hypercholesteremia   . Hypertension   . Lymphedema    lower extremities  . S/P total hip arthroplasty 01/25/2015  . Shingles   . Stroke [facility])    noted ischemia on CT scan    Patient Active Problem List   Diagnosis Date Noted  . TIA (transient ischemic attack) 12/24/2016  . Acute encephalopathy 10/01/2016  . Symptomatic bradycardia 05/15/2016  . HTN (hypertension) 05/15/2016  . HLD (hyperlipidemia) 05/15/2016  . Atrial fibrillation (HCC) 03/11/2016  . AKI (acute kidney injury) (HCC) 05/06/2015  . S/P total hip arthroplasty 01/25/2015  . Primary osteoarthritis of left hip 01/23/2015    Past Surgical History:  Procedure Laterality Date  . JOINT REPLACEMENT  01/23/15   Left THR Dr. Rosita Kea   . PACEMAKER  INSERTION  2017  . TOTAL HIP ARTHROPLASTY Left 01/23/2015   Procedure: TOTAL HIP ARTHROPLASTY ANTERIOR APPROACH;  Surgeon: Kennedy Bucker, MD;  Location: [facility] ORS;  Service: Orthopedics;  Laterality: Left;    Prior to Admission medications   Medication Sig Start Date End Date Taking? Authorizing Provider  apixaban (ELIQUIS) 5 MG TABS tablet Take 5 mg by mouth 2 (two) times daily.   Yes Historical Provider, MD  aspirin EC 81 MG tablet Take 1 tablet (81 mg total) by mouth daily. 10/02/16  Yes Srikar Sudini, MD  fluticasone (FLONASE) 50 MCG/ACT nasal spray Place 2 sprays into both nostrils daily as needed for rhinitis.    Yes Historical Provider, MD  hydrochlorothiazide (HYDRODIURIL) 12.5 MG tablet Take 12.5 mg by mouth daily. 10/02/16  Yes Historical Provider, MD  metoprolol tartrate (LOPRESSOR) 25 MG tablet Take 1 tablet (25 mg total) by mouth 2 (two) times daily. 10/04/16 10/04/17 Yes Sharman Cheek, MD  Multiple Vitamins-Minerals (CENTRUM SILVER PO) Take 1 tablet by mouth daily.   Yes Historical Provider, MD  simvastatin (ZOCOR) 20 MG tablet Take 20 mg by mouth every evening.    Yes Historical Provider, MD    Allergies  Allergen Reactions  . Morphine And Related Nausea And Vomiting    Family History  Problem Relation Age of Onset  . Hypertension Other     Social History Social History  Substance Use Topics  . Smoking status: Never Smoker  . Smokeless tobacco: Never Used  .  Alcohol use No    Review of Systems  Constitutional: Negative for fever. Eyes: Negative for visual changes. ENT: Negative for sore throat. Cardiovascular: Negative for chest pain. Respiratory: Negative for shortness of breath. Gastrointestinal: Negative for abdominal pain, vomiting and diarrhea. Genitourinary: Negative for dysuria. Musculoskeletal: Negative for back pain. Skin: Negative for rash. Neurological: positivefor headache. 10 point Review of Systems otherwise  negative ____________________________________________   PHYSICAL EXAM:  VITAL SIGNS: ED Triage Vitals  Enc Vitals Group     BP 12/24/16 1015 (!) 209/98     Pulse Rate 12/24/16 1015 66     Resp 12/24/16 1015 18     Temp 12/24/16 1015 97.8 F (36.6 C)     Temp Source 12/24/16 1015 Oral     SpO2 12/24/16 1015 97 %     Weight 12/24/16 1038 150 lb (68 kg)     Height 12/24/16 1038  (1.499 m)     Head Circumference --      Peak Flow --      Pain Score --      Pain Loc --      Pain Edu? --      Excl. in GC? --      Constitutional: Alert and oriented. Well appearing and in no distress. HEENT   Head: Normocephalic and atraumatic.      Eyes: Conjunctivae are normal. PERRL. Normal extraocular movements.      Ears:         Nose: No congestion/rhinnorhea.   Mouth/Throat: Mucous membranes are moist.   Neck: No stridor. Cardiovascular/Chest: Normal rate, regular rhythm.  No murmurs, rubs, or gallops. Respiratory: Normal respiratory effort without tachypnea nor retractions. Breath sounds are clear and equal bilaterally. No wheezes/rales/rhonchi. Gastrointestinal: Soft. No distention, no guarding, no rebound. Nontender.  Slightly obese  Genitourinary/rectal:Deferred Musculoskeletal: Nontender with normal range of motion in all extremities. No joint effusions.  No lower extremity tenderness.  No edema. Neurologic:  Initially somewhat slurred speech without facial droop, however she did improved to essentially normal speech.  Normal strength and sensation in the upper extremity. Normal sensory exam the face. She has some mild tremor in all 4 extremities, but she is able to do finger-nose and heel shin testing. Both legs are weak but seems somewhat similar. Skin:  Skin is warm, dry and intact. No rash noted. Psychiatric: Mood and affect are normal. Speech and behavior are normal. Patient exhibits appropriate insight and  judgment.   ____________________________________________  LABS (pertinent positives/negatives)  Labs Reviewed  COMPREHENSIVE METABOLIC PANEL - Abnormal; Notable for the following:       Result Value   Glucose, Bld 165 (*)    BUN 22 (*)    Creatinine, Ser 1.01 (*)    Alkaline Phosphatase 158 (*)    GFR calc non Af Amer 48 (*)    GFR calc Af Amer 55 (*)    All other components within normal limits  GLUCOSE, CAPILLARY - Abnormal; Notable for the following:    Glucose-Capillary 174 (*)    All other components within normal limits  URINALYSIS, COMPLETE (UACMP) WITH MICROSCOPIC - Abnormal; Notable for the following:    Color, Urine STRAW (*)    APPearance CLEAR (*)    Bacteria, UA RARE (*)    All other components within normal limits  PROTIME-INR  APTT  CBC  DIFFERENTIAL  TROPONIN I  CBG MONITORING, ED    ____________________________________________    EKG I, Governor Rooks, MD, the attending physician have  personally viewed and interpreted all ECGs.  80 bpm. AV dual paced rhythm occasional native beats. ____________________________________________  RADIOLOGY All Xrays were viewed by me. Imaging interpreted by Radiologist.  CT without contrast:  IMPRESSION: 1. No acute intracranial abnormality. Chronic ischemic changes as above. 2. ASPECTS is 10  These results were called by telephone at the time of interpretation on 12/24/2016 at 10:30 am to Dr. Shaune Pollack, who verbally acknowledged these results.  __________________________________________  PROCEDURES  Procedure(s) performed: None  Critical Care performed: CRITICAL CARE Performed by: Governor Rooks   Total critical care time: 30 minutes  Critical care time was exclusive of separately billable procedures and treating other patients.  Critical care was necessary to treat or prevent imminent or life-threatening deterioration.  Critical care was time spent personally by me on the following activities: development  of treatment plan with patient and/or surrogate as well as nursing, discussions with consultants, evaluation of patient's response to treatment, examination of patient, obtaining history from patient or surrogate, ordering and performing treatments and interventions, ordering and review of laboratory studies, ordering and review of radiographic studies, pulse oximetry and re-evaluation of patient's condition.   ____________________________________________   ED COURSE / ASSESSMENT AND PLAN  Pertinent labs & imaging results that were available during my care of the patient were reviewed by me and considered in my medical decision making (see chart for details).   Arrived with acute altered mental status with aphasia at 9:50 and then slurred speech, now improving upon arrival and then relieved in ER.  This was made, stroke on arrival and head CT report was called to me as negative.  Dr. Thad Ranger with neurology was at the bedside.  Patient is on maximal TIA and stroke treatment with Eloquis and aspirin. She would not be a TPA candidate based on this.  She did improve and was essentially back to baseline when she developed an additional episode where she was decreased responsive, and her eyes were deviated according to the nurse. When I went in the room to examine her, she was nonresponsive and was interacting, and was not showing seizure activity.  Speech was slow but not slurred and appropriate.  Patient will be observed in the hospital.    CONSULTATIONS:   Dr. Thad Ranger, neurology at bedside for code stroke.  Dr. Juliene Pina hospitalist for admission.   Patient / Family / Caregiver informed of clinical course, medical decision-making process, and agree with plan.    ___________________________________________   FINAL CLINICAL IMPRESSION(S) / ED DIAGNOSES   Final diagnoses:  Transient cerebral ischemia, unspecified type  Slurred speech              Note: This dictation  was prepared with Dragon dictation. Any transcriptional errors that result from this process are unintentional    Governor Rooks, MD 12/24/16 1207

## 2016-12-24 NOTE — H&P (Addendum)
[facility] - [place] at [facility]   PATIENT NAME: Colleen Carson    MR#:  [id number]  DATE OF BIRTH:  [birth year]  DATE OF ADMISSION:  12/24/2016  PRIMARY CARE PHYSICIAN: [name], Lavada Mesi, MD   REQUESTING/REFERRING PHYSICIAN:  Dr Shaune Pollack  CHIEF COMPLAINT:    trouble speaking this am HISTORY OF PRESENT ILLNESS:  Colleen Carson  is a 83 y.o. female with a known history of  Atrial fibrillation on anticoagulation and essential hypertension who presents above complaint.    Apparently patient forgot to take her medications and went shopping with her daughter this morning. Other sitting in a car waiting for the start to open her daughter noticed that patient had slurred speech and was unable to talk for some time. Her symptoms resolved by the time they arrived here to the ER. She had no other neurological deficits. She was evaluated for code stroke by Dr. Thad Ranger. She has had several episodes of TIA in the past with full workup. She has a pacemaker and is unable to have an MRI. Initial plan was for the patient to be discharged to home since patient is on full therapy including anticoagulation medication. She had another episode of slurred speech that the nurse noted while in the emergency room. Given the second presentation she will be observed overnight with plans to obtain an EEG to evaluate for seizure. Patient's blood pressure was elevated with systolic greater than 200 upon arrival. She has been in her usual state up until today. She denies headaches. She had diarrhea over a week ago. She is also complaining of some abdominal cramping which is on and off during her short stay in emergency room.  PAST MEDICAL HISTORY:   Past Medical History:  Diagnosis Date  . Arthritis   . Atrial fibrillation (HCC)   . Dysrhythmia    Afib  . Hypercholesteremia   . Hypertension   . Lymphedema    lower extremities  . S/P total hip arthroplasty 01/25/2015  . Shingles   . Stroke [facility])    noted  ischemia on CT scan    PAST SURGICAL HISTORY:   Past Surgical History:  Procedure Laterality Date  . JOINT REPLACEMENT  01/23/15   Left THR Dr. Rosita Kea   . PACEMAKER INSERTION  2017  . TOTAL HIP ARTHROPLASTY Left 01/23/2015   Procedure: TOTAL HIP ARTHROPLASTY ANTERIOR APPROACH;  Surgeon: Kennedy Bucker, MD;  Location: [facility] ORS;  Service: Orthopedics;  Laterality: Left;    SOCIAL HISTORY:   Social History  Substance Use Topics  . Smoking status: Never Smoker  . Smokeless tobacco: Never Used  . Alcohol use No    FAMILY HISTORY:   Family History  Problem Relation Age of Onset  . Hypertension Other     DRUG ALLERGIES:   Allergies  Allergen Reactions  . Morphine And Related Nausea And Vomiting    REVIEW OF SYSTEMS:   Review of Systems  Constitutional: Negative.  Negative for chills, fever and malaise/fatigue.  HENT: Negative.  Negative for ear discharge, ear pain, hearing loss, nosebleeds and sore throat.   Eyes: Negative.  Negative for blurred vision and pain.  Respiratory: Negative.  Negative for cough, hemoptysis, shortness of breath and wheezing.   Cardiovascular: Negative.  Negative for chest pain, palpitations and leg swelling.  Gastrointestinal: Positive for abdominal pain. Negative for blood in stool, diarrhea, nausea and vomiting.  Genitourinary: Negative.  Negative for dysuria.  Musculoskeletal: Negative.  Negative for back pain.  Skin: Negative.  Neurological: Positive for speech change. Negative for dizziness, tremors, focal weakness, seizures and headaches.  Endo/Heme/Allergies: Negative.  Does not bruise/bleed easily.  Psychiatric/Behavioral: Negative.  Negative for depression, hallucinations and suicidal ideas.    MEDICATIONS AT HOME:   Prior to Admission medications   Medication Sig Start Date End Date Taking? Authorizing Provider  apixaban (ELIQUIS) 5 MG TABS tablet Take 5 mg by mouth 2 (two) times daily.   Yes Historical Provider, MD  aspirin EC 81 MG  tablet Take 1 tablet (81 mg total) by mouth daily. 10/02/16  Yes Srikar Sudini, MD  fluticasone (FLONASE) 50 MCG/ACT nasal spray Place 2 sprays into both nostrils daily as needed for rhinitis.    Yes Historical Provider, MD  hydrochlorothiazide (HYDRODIURIL) 12.5 MG tablet Take 12.5 mg by mouth daily. 10/02/16  Yes Historical Provider, MD  metoprolol tartrate (LOPRESSOR) 25 MG tablet Take 1 tablet (25 mg total) by mouth 2 (two) times daily. 10/04/16 10/04/17 Yes Sharman Cheek, MD  Multiple Vitamins-Minerals (CENTRUM SILVER PO) Take 1 tablet by mouth daily.   Yes Historical Provider, MD  simvastatin (ZOCOR) 20 MG tablet Take 20 mg by mouth every evening.    Yes Historical Provider, MD      VITAL SIGNS:  Blood pressure (!) 161/85, pulse 96, temperature 97.8 F (36.6 C), temperature source Oral, resp. rate 18, height  (1.499 m), weight 68 kg (150 lb), SpO2 100 %.  PHYSICAL EXAMINATION:   Physical Exam  Constitutional: She is oriented to person, place, and time and well-developed, well-nourished, and in no distress. No distress.  HENT:  Head: Normocephalic.  Eyes: No scleral icterus.  Neck: Normal range of motion. Neck supple. No JVD present. No tracheal deviation present.  Cardiovascular: Normal rate, regular rhythm and normal heart sounds.  Exam reveals no gallop and no friction rub.   No murmur heard. Pulmonary/Chest: Effort normal and breath sounds normal. No respiratory distress. She has no wheezes. She has no rales. She exhibits no tenderness.  Abdominal: Soft. Bowel sounds are normal. She exhibits no distension and no mass. There is no tenderness. There is no rebound and no guarding.  Musculoskeletal: Normal range of motion. She exhibits no edema.  Neurological: She is alert and oriented to person, place, and time.  Skin: Skin is warm. No rash noted. No erythema.  Psychiatric: Affect and judgment normal.      LABORATORY PANEL:   CBC  Recent Labs Lab 12/24/16 1014  WBC  7.0  HGB 14.2  HCT 42.5  PLT 160   ------------------------------------------------------------------------------------------------------------------  Chemistries   Recent Labs Lab 12/24/16 1014  NA 138  K 3.9  CL 103  CO2 26  GLUCOSE 165*  BUN 22*  CREATININE 1.01*  CALCIUM 9.5  AST 36  ALT 19  ALKPHOS 158*  BILITOT 0.7   ------------------------------------------------------------------------------------------------------------------  Cardiac Enzymes  Recent Labs Lab 12/24/16 1014  TROPONINI <0.03   ------------------------------------------------------------------------------------------------------------------  RADIOLOGY:  Ct Head Code Stroke W/o Cm  Result Date: 12/24/2016 CLINICAL DATA:  Code stroke.  Altered mental status EXAM: CT HEAD WITHOUT CONTRAST TECHNIQUE: Contiguous axial images were obtained from the base of the skull through the vertex without intravenous contrast. COMPARISON:  CT head 10/04/2016 FINDINGS: Brain: Ventricle size normal. Cerebral volume normal for age. Chronic infarct occipital lobe bilaterally left greater than right unchanged from the prior study. Chronic white matter infarct in the left frontal lobe also unchanged. Negative for acute infarct, hemorrhage, or mass lesion. Vascular: Negative for hyperdense vessel. Skull: Negative Sinuses/Orbits:  Negative Other: None ASPECTS (Alberta Stroke Program Early CT Score) - Ganglionic level infarction (caudate, lentiform nuclei, internal capsule, insula, M1-M3 cortex): 7 - Supraganglionic infarction (M4-M6 cortex): 3 Total score (0-10 with 10 being normal): 10 IMPRESSION: 1. No acute intracranial abnormality. Chronic ischemic changes as above. 2. ASPECTS is 10 These results were called by telephone at the time of interpretation on 12/24/2016 at 10:30 am to Dr. Shaune Pollack, who verbally acknowledged these results. Electronically Signed   By: Marlan Palau M.D.   On: 12/24/2016 10:31    EKG:  Paced  rhythm  IMPRESSION AND PLAN:   83 year old female with history of atrial fibrillation on anticoagulation, status post pacemaker and essential hypertension who presents with slurred speech.  1. Slurred speech: Patient is already on maximal medical therapy. She has a pacemaker and therefore will not obtain MRI/MRA\ ECHO was done in 09/2016 EEG requested as per neurology CTA neck Continue aspirin and Eliquis Suspect this could also be related to elevated blood pressure.  2. Accelerated essential hypertension due to not taking medications this morning Her home medications including metoprolol and HCTZ  3. Atrial fibrillation: Continue metoprolol for heart rate control and Eliquis  4. Hyperlipidemia: Continue statin  5 abdominal cramping: Check UA to evaluate for UTI. If this is negative then we'll order abdominal x-ray.    All the records are reviewed and case discussed with ED provider. Management plans discussed with the patient and she is in agreement  CODE STATUS: DNR  TOTAL TIME TAKING CARE OF THIS PATIENT: 45 minutes.    Aimie Wagman M.D on 12/24/2016 at 11:27 AM  Between 7am to 6pm - Pager - [phone]  After 6pm go to [email or web address] - password [email or web address]  [facility]  Office  [phone]  CC: Primary care physician; [name], Lavada Mesi, MD

## 2016-12-24 NOTE — ED Notes (Signed)
Pt oob to br with standby assist  

## 2016-12-24 NOTE — Progress Notes (Signed)
Family Meeting Note  Advance Directive:yes  Today a meeting took place with the Patient.     The following clinical team members were present during this meeting:MD  The following were discussed:Patient's diagnosis: slurred speech Atrial fibrillation  Patient's progosis: Unable to determine and Goals for treatment: DNR  Additional follow-up to be provided: none  Time spent during discussion:16 minutes  Colleen Escutia, MD

## 2016-12-24 NOTE — ED Notes (Signed)
Pt with c/o not feeling right. Says that heart rate feels low. HR 66 paced. Says that bp is low. bp 173/59 reminded pt that I gave her hydralazine and it dropped her bp. Pt says her teeth feel like they are chattering. Dr mody notified and said to transfer pt to floor.

## 2016-12-25 DIAGNOSIS — G459 Transient cerebral ischemic attack, unspecified: Secondary | ICD-10-CM | POA: Diagnosis not present

## 2016-12-25 LAB — TSH: TSH: 3.762 u[IU]/mL (ref 0.350–4.500)

## 2016-12-25 LAB — LIPID PANEL
CHOL/HDL RATIO: 1.9 ratio
Cholesterol: 160 mg/dL (ref 0–200)
HDL: 84 mg/dL (ref >40)
LDL CALC: 65 mg/dL (ref 0–99)
Triglycerides: 56 mg/dL (ref <150)
VLDL: 11 mg/dL (ref 0–40)

## 2016-12-25 MED ORDER — ATORVASTATIN CALCIUM 40 MG PO TABS
40.0000 mg | ORAL_TABLET | Freq: Every day | ORAL | 0 refills | Status: DC
Start: 2016-12-25 — End: 2018-01-16

## 2016-12-25 NOTE — Progress Notes (Signed)
SLP Cancellation Note  Patient Details Name: NECHAMA ESCUTIA MRN: [id number] DOB: 07/17/1925   Cancelled treatment:       Reason Eval/Treat Not Completed: SLP screened, no needs identified, will sign off (chart reviewed; consulted pt/Daughter and NSG). Pt denied any difficulty swallowing and is currently on a regular diet; tolerates swallowing pills w/ water per NSG. Pt conversed at conversational level w/out overt deficits noted; pt and family denied any speech-language concerns "now" stating pt was back to her baseline. No further skilled ST services indicated as pt appears at her baseline. Pt agreed. NSG to reconsult if any change in status.    Jerilynn Som, MS, CCC-SLP Watson,Katherine 12/25/2016, 9:32 AM

## 2016-12-25 NOTE — Discharge Summary (Signed)
[facility] - [place] at [facility]   PATIENT NAME: Colleen Carson    MR#:  [id number]  DATE OF BIRTH:  Nov 24, 1924  DATE OF ADMISSION:  12/24/2016 ADMITTING PHYSICIAN: Adrian Saran, MD  DATE OF DISCHARGE: 12/25/2016 12:30 PM  PRIMARY CARE PHYSICIAN: [name], [name], MD    ADMISSION DIAGNOSIS:  Slurred speech [R47.81] Abdominal pain [R10.9] Transient cerebral ischemia, unspecified type [G45.9]  DISCHARGE DIAGNOSIS:  Active Problems:   TIA (transient ischemic attack)   SECONDARY DIAGNOSIS:   Past Medical History:  Diagnosis Date  . Arthritis   . Atrial fibrillation (HCC)   . Dysrhythmia    Afib  . Hypercholesteremia   . Hypertension   . Lymphedema    lower extremities  . S/P total hip arthroplasty 01/25/2015  . Shingles   . Stroke [facility])    noted ischemia on CT scan    HOSPITAL COURSE:   1.Transient ischemic attack with slurred speech. CT scan of the head was negative. Since she has a pacemaker, we are unable to get an MRI of the brain. EEG was negative. CT angiogram the neck was negative. Echocardiogram recently done. Patient already on aspirin and Eliquis. No further symptoms today.  Of note the patient did miss her eliquis dose the morning of this event. I advised her she must take this medication twice a day. The patient did also have elevated blood pressure at that time. Neurological symptoms resolved 2. Accelerated hypertension on presentation. Blood pressure normal range Upon discharge. 3. Hyperlipidemia unspecified. Simvastatin switched to atorvastatin. LDL acceptable at 65 4. Thyroid nodule seen on CT scan. The patient follows with endocrinologist and they have an appointment on Tuesday. TSH added on to labs and it is in the normal range. Likely a thyroid ultrasound will be done as outpatient. 5. I advised the daughter to follow-up with medical doctor about Mini-Mental status exam 6. Atrial fibrillation. On metoprolol and eliquis for  anticoagulation  DISCHARGE CONDITIONS:   Satisfactory   CONSULTS OBTAINED:  Treatment Team:  Kym Groom, MD Thana Farr, MD  DRUG ALLERGIES:   Allergies  Allergen Reactions  . Morphine And Related Nausea And Vomiting    DISCHARGE MEDICATIONS:   Discharge Medication List as of 12/25/2016 11:58 AM    START taking these medications   Details  atorvastatin (LIPITOR) 40 MG tablet Take 1 tablet (40 mg total) by mouth daily., Starting Thu 12/25/2016, Print      CONTINUE these medications which have NOT CHANGED   Details  apixaban (ELIQUIS) 5 MG TABS tablet Take 5 mg by mouth 2 (two) times daily., Historical Med    aspirin EC 81 MG tablet Take 1 tablet (81 mg total) by mouth daily., Starting Thu 10/02/2016, No Print    fluticasone (FLONASE) 50 MCG/ACT nasal spray Place 2 sprays into both nostrils daily as needed for rhinitis. , Historical Med    hydrochlorothiazide (HYDRODIURIL) 12.5 MG tablet Take 12.5 mg by mouth daily., Starting Thu 10/02/2016, Historical Med    metoprolol tartrate (LOPRESSOR) 25 MG tablet Take 1 tablet (25 mg total) by mouth 2 (two) times daily., Starting Sat 10/04/2016, Until Sun 10/04/2017, Print    Multiple Vitamins-Minerals (CENTRUM SILVER PO) Take 1 tablet by mouth daily., Historical Med      STOP taking these medications     simvastatin (ZOCOR) 20 MG tablet          DISCHARGE INSTRUCTIONS:   Follow-up PMD in 1 week Potential referral to neurology as outpatient Follow-up with endocrinology  as scheduled    If you experience worsening of your admission symptoms, develop shortness of breath, life threatening emergency, suicidal or homicidal thoughts you must seek medical attention immediately by calling 911 or calling your MD immediately  if symptoms less severe.  You Must read complete instructions/literature along with all the possible adverse reactions/side effects for all the Medicines you take and that have been prescribed to you. Take  any new Medicines after you have completely understood and accept all the possible adverse reactions/side effects.   Please note  You were cared for by a hospitalist during your hospital stay. If you have any questions about your discharge medications or the care you received while you were in the hospital after you are discharged, you can call the unit and asked to speak with the hospitalist on call if the hospitalist that took care of you is not available. Once you are discharged, your primary care physician will handle any further medical issues. Please note that NO REFILLS for any discharge medications will be authorized once you are discharged, as it is imperative that you return to your primary care physician (or establish a relationship with a primary care physician if you do not have one) for your aftercare needs so that they can reassess your need for medications and monitor your lab values.    Today   CHIEF COMPLAINT:   Chief Complaint  Patient presents with  . Code Stroke    HISTORY OF PRESENT ILLNESS:  Colleen Carson  is a 83 y.o. female with a known history of Previous stroke presents with slurred speech   VITAL SIGNS:  Blood pressure 130/61, pulse 60, temperature 97.5 F (36.4 C), temperature source Oral, resp. rate 20, height  (1.499 m), weight 64.4 kg (142 lb), SpO2 97 %.    PHYSICAL EXAMINATION:  GENERAL:  83 y.o.-year-old patient lying in the bed with no acute distress.  EYES: Pupils equal, round, reactive to light and accommodation. No scleral icterus. Extraocular muscles intact.  HEENT: Head atraumatic, normocephalic. Oropharynx and nasopharynx clear.  NECK:  Supple, no jugular venous distention. No thyroid enlargement, no tenderness.  LUNGS: Normal breath sounds bilaterally, no wheezing, rales,rhonchi or crepitation. No use of accessory muscles of respiration.  CARDIOVASCULAR: S1, S2 normal. No murmurs, rubs, or gallops.  ABDOMEN: Soft, non-tender,  non-distended. Bowel sounds present. No organomegaly or mass.  EXTREMITIES: No pedal edema, cyanosis, or clubbing.  NEUROLOGIC: Cranial nerves II through XII are intact. Muscle strength 5/5 in all extremities. Sensation intact. Gait not checked.  PSYCHIATRIC: The patient is alert and oriented x 3.  SKIN: No obvious rash, lesion, or ulcer.   DATA REVIEW:   CBC  Recent Labs Lab 12/24/16 1014  WBC 7.0  HGB 14.2  HCT 42.5  PLT 160    Chemistries   Recent Labs Lab 12/24/16 1014  NA 138  K 3.9  CL 103  CO2 26  GLUCOSE 165*  BUN 22*  CREATININE 1.01*  CALCIUM 9.5  AST 36  ALT 19  ALKPHOS 158*  BILITOT 0.7    Cardiac Enzymes  Recent Labs Lab 12/24/16 1014  TROPONINI <0.03      RADIOLOGY:  Dg Abd 1 View  Result Date: 12/24/2016 CLINICAL DATA:  Abdominal pain. History of hypertension tension, previous CVA EXAM: ABDOMEN - 1 VIEW COMPARISON:  None in PACs FINDINGS: The bowel gas pattern is normal. There is no small or large bowel obstructive pattern. There is a prosthetic left hip joint. The native  bony structures are diffusely osteopenic. There is coarse calcification in the right upper quadrant. IMPRESSION: No acute intra-abdominal abnormality is observed. Coarse calcification in the right upper quadrant could reflect gallstones or kidney stones. Electronically Signed   By: David  [place] M.D.   On: 12/24/2016 14:56   Ct Angio Neck W/cm &/or Wo/cm  Result Date: 12/24/2016 CLINICAL DATA:  Aphasia and slurred speech at 0950 hours. History of atrial fibrillation, hypertension and stroke, on Eliquis. EXAM: CT ANGIOGRAPHY NECK TECHNIQUE: Multidetector CT imaging of the neck was performed using the standard protocol during bolus administration of intravenous contrast. Multiplanar CT image reconstructions and MIPs were obtained to evaluate the vascular anatomy. Carotid stenosis measurements (when applicable) are obtained utilizing NASCET criteria, using the distal internal carotid  diameter as the denominator. CONTRAST:  75 cc Isovue 370 COMPARISON:  Carotid ultrasound October 02, 2016 FINDINGS: AORTIC ARCH: Normal appearance of the thoracic arch, normal branch pattern. Moderate calcific atherosclerosis. The origins of the innominate, left Common carotid artery and subclavian artery are widely patent. RIGHT CAROTID SYSTEM: Common carotid artery is widely patent, coursing in a straight line fashion. Normal appearance of the carotid bifurcation without hemodynamically significant stenosis by NASCET criteria, mild calcific atherosclerosis. Mildly beaded and ectatic RIGHT cervical internal carotid artery. LEFT CAROTID SYSTEM: Common carotid artery is widely patent, coursing in a straight line fashion. Normal appearance of the carotid bifurcation without hemodynamically significant stenosis by NASCET criteria, mild calcific atherosclerosis. Moderately beaded appearance of LEFT cervical internal carotid artery. VERTEBRAL ARTERIES:Codominant vertebral artery's with moderate calcific atherosclerosis of the origins without stenosis. Bilateral vertebral artery's are widely patent, mild extrinsic deformity due to degenerative cervical spine. SKELETON: No acute osseous process though bone windows have not been submitted. Severe C5-6 degenerative disc. Osteopenia multilevel severe facet arthropathy. Moderate to severe LEFT C5-6 and LEFT C3-4 neural foraminal narrowing. OTHER NECK: Soft tissues of the neck are nonacute though, not tailored for evaluation. LEFT cardiac pacemaker. 17 mm RIGHT thyroid nodule. Status post LEFT thyroidectomy. IMPRESSION: Atherosclerosis without hemodynamically significant stenosis or acute vascular process. Internal carotid artery fibromuscular dysplasia. **An incidental finding of potential clinical significance has been found. 17 mm RIGHT thyroid nodule, status post LEFT thyroidectomy. Recommend thyroid sonogram on a nonemergent basis.** Electronically Signed   By: Awilda Metro M.D.   On: 12/24/2016 17:15   Ct Head Code Stroke W/o Cm  Result Date: 12/24/2016 CLINICAL DATA:  Code stroke.  Altered mental status EXAM: CT HEAD WITHOUT CONTRAST TECHNIQUE: Contiguous axial images were obtained from the base of the skull through the vertex without intravenous contrast. COMPARISON:  CT head 10/04/2016 FINDINGS: Brain: Ventricle size normal. Cerebral volume normal for age. Chronic infarct occipital lobe bilaterally left greater than right unchanged from the prior study. Chronic white matter infarct in the left frontal lobe also unchanged. Negative for acute infarct, hemorrhage, or mass lesion. Vascular: Negative for hyperdense vessel. Skull: Negative Sinuses/Orbits: Negative Other: None ASPECTS (Alberta Stroke Program Early CT Score) - Ganglionic level infarction (caudate, lentiform nuclei, internal capsule, insula, M1-M3 cortex): 7 - Supraganglionic infarction (M4-M6 cortex): 3 Total score (0-10 with 10 being normal): 10 IMPRESSION: 1. No acute intracranial abnormality. Chronic ischemic changes as above. 2. ASPECTS is 10 These results were called by telephone at the time of interpretation on 12/24/2016 at 10:30 am to Dr. Shaune Pollack, who verbally acknowledged these results. Electronically Signed   By: Marlan Palau M.D.   On: 12/24/2016 10:31    Management plans discussed with the patient, family and  they are in agreement.  CODE STATUS:  Code Status History    Date Active Date Inactive Code Status Order ID Comments User Context   12/25/2016 11:53 AM 12/25/2016  3:47 PM DNR [id number]  Bo Mcclintock, RN Inpatient   12/24/2016  3:03 PM 12/25/2016 11:53 AM Full Code [id number]  Adrian Saran, MD Inpatient   12/24/2016 11:26 AM 12/24/2016  3:03 PM DNR [id number]  Adrian Saran, MD ED   10/02/2016 12:41 AM 10/02/2016 10:12 PM DNR [id number]  Oralia Manis, MD Inpatient   05/15/2016  2:19 AM 05/15/2016  3:19 PM DNR [id number]  Oralia Manis, MD Inpatient   03/11/2016  8:39 AM 03/13/2016  4:09 PM DNR [id number]   Wyatt Haste, MD ED   03/11/2016  8:39 AM 03/11/2016  8:39 AM Full Code [id number]  Wyatt Haste, MD ED   05/06/2015  3:01 AM 05/07/2015  3:02 PM DNR [id number]  Arnaldo Natal, MD Inpatient   05/06/2015  1:57 AM 05/06/2015  3:01 AM Full Code [id number]  Arnaldo Natal, MD ED   01/23/2015 11:21 AM 01/26/2015  4:25 PM Full Code [id number]  Kennedy Bucker, MD Inpatient    Questions for Most Recent Historical Code Status (Order [id number])    Question Answer Comment   In the event of cardiac or respiratory ARREST Do not call a "code blue"    In the event of cardiac or respiratory ARREST Do not perform Intubation, CPR, defibrillation or ACLS    In the event of cardiac or respiratory ARREST Use medication by any route, position, wound care, and other measures to relive pain and suffering. May use oxygen, suction and manual treatment of airway obstruction as needed for comfort.         Advance Directive Documentation     Most Recent Value  Type of Advance Directive  Healthcare Power of Attorney, Living will, Out of facility DNR (pink MOST or yellow form)  Pre-existing out of facility DNR order (yellow form or pink MOST form)  -  "MOST" Form in Place?  -      TOTAL TIME TAKING CARE OF THIS PATIENT: 35 minutes.    Alford Highland M.D on 12/25/2016 at 4:52 PM  Between 7am to 6pm - Pager - [phone]  After 6pm go to [email or web address] - password [email or web address]  [facility] Office  [phone]  CC: Primary care physician; [name], Lavada Mesi, MD

## 2016-12-25 NOTE — Evaluation (Signed)
Occupational Therapy Evaluation Patient Details Name: Colleen Carson MRN: [id number] DOB: May 13, 1925 Today's Date: 12/25/2016    History of Present Illness Pt admitted for possible TIA. Pt with complains of slurred speech while out shopping with daughter. Pt with history of afib, pacemaker, HTN, and CVA.    Clinical Impression   Pt is 83 year old female who presents with new onset of slurred speech which has resolved since admission.  CT was negative. Pt is able to move B UE and hands but has ongoing numbness in hands which she states is from arthritis and gets worse when it is warm outside and causes swelling.  Pt and daughter educated in exercises and guidelines to prevent injury when performing cooking tasks for safety precaution.  Rec using dycem to keep items in place.  Pt was able to don socks and shoes with extra time for socks and completing toileting and hygiene with supervision only with no LOB or safety concerns noted.  She has grab bars by shower and toilet and has a raised toilet and walk in shower with grab bars.  She has a shower chair but does not currently use it.  She lives at home with her husband and daughter and her husband who live upstairs and she and her husband live downstairs. Pt is at baseline for ADLs and does not need any further OT.   Follow Up Recommendations  No OT follow up    Equipment Recommendations       Recommendations for Other Services       Precautions / Restrictions Precautions Precautions: Fall Restrictions Weight Bearing Restrictions: No      Mobility Bed Mobility Overal bed mobility: Independent             General bed mobility comments: safe technique performed  Transfers Overall transfer level: Independent Equipment used: None             General transfer comment: safe technique performed without assistance    Balance Overall balance assessment: No apparent balance deficits (not formally assessed)                                          ADL either performed or assessed with clinical judgement   ADL Overall ADL's : At baseline                                       General ADL Comments: Pt currently at baseline and able to ambulate to toilet and transfer on/off, urinate and complete clean up and wash hands with supervision only and no assist or cues.     Vision Baseline Vision/History: No visual deficits Patient Visual Report: No change from baseline       Perception     Praxis      Pertinent Vitals/Pain Pain Assessment: No/denies pain     Hand Dominance Right   Extremity/Trunk Assessment Upper Extremity Assessment Upper Extremity Assessment: Overall WFL for tasks assessed   Lower Extremity Assessment Lower Extremity Assessment: Defer to PT evaluation       Communication Communication Communication: No difficulties   Cognition Arousal/Alertness: Awake/alert Behavior During Therapy: WFL for tasks assessed/performed Overall Cognitive Status: Within Functional Limits for tasks assessed  General Comments       Exercises     Shoulder Instructions      Home Living Family/patient expects to be discharged to:: Private residence Living Arrangements: Children Available Help at Discharge: Family;Available 24 hours/day Type of Home: House Home Access: Stairs to enter [employer] of Steps: 3 Entrance Stairs-Rails: Can reach both Home Layout: Two level;Able to live on main level with bedroom/bathroom     Bathroom Shower/Tub: Walk-in shower;Door   Bathroom Toilet: Handicapped height Bathroom Accessibility: Yes   Home Equipment: Environmental consultant - 2 wheels;Walker - 4 wheels;Wheelchair - manual;Grab bars - tub/shower;Cane - single point;Shower seat;Grab bars - toilet   Additional Comments: has a shower chair but does not currently use it      Prior Functioning/Environment Level of Independence:  Independent        Comments: Pt usually indep, however occasionally uses rollater for community distances        OT Problem List:        OT Treatment/Interventions:      OT Goals(Current goals can be found in the care plan section) Acute Rehab OT Goals Patient Stated Goal: to go home  OT Frequency:     Barriers to D/C:            Co-evaluation              End of Session Equipment Utilized During Treatment: Gait belt  Activity Tolerance: Patient tolerated treatment well Patient left: in bed;with call bell/phone within reach;with bed alarm set;with family/visitor present  OT Visit Diagnosis: Muscle weakness (generalized) (M62.81)                Time: 0940-1030 OT Time Calculation (min): 50 min Charges:  OT General Charges $OT Visit: 1 Procedure OT Evaluation $OT Eval Low Complexity: 1 Procedure OT Treatments $Self Care/Home Management : 23-37 mins $Therapeutic Exercise: 8-22 mins G-Codes: OT G-codes **NOT FOR INPATIENT CLASS** Functional Limitation: Self care Self Care Current Status (Z6109): 0 percent impaired, limited or restricted Self Care Goal Status (U0454): 0 percent impaired, limited or restricted   Susanne Borders, OTR/L ascom [phone] 12/25/16, 11:07 AM

## 2016-12-25 NOTE — Care Management (Signed)
Admitted to this facility with the diagnosis of TIA under observation status. Lives with daughter, Raynelle Fanning [phone]). Last seen Dr. Madelin Rear a month ago. No home health. No skilled facility. No home oxygen. Rolling walker in the home. Prescriptions are filled at [facility] mail order. Takes care of all basic activities of daily living herself, doesn't drive. Daughter does errands. Good appetite. No falls. Daughter will transport. Worked at [employer] in [place]. Moved to [place] 3 years ago to live with daughter. Gwenette Greet RN MSN CCM Care Management

## 2016-12-25 NOTE — Evaluation (Signed)
Physical Therapy Evaluation Patient Details Name: Colleen Carson MRN: [id number] DOB: 08/18/1925 Today's Date: 12/25/2016   History of Present Illness  Pt admitted for possible TIA. Pt with complains of slurred speech while out shopping with daughter. Pt with history of afib, pacemaker, HTN, and CVA.   Clinical Impression  Pt is a pleasant 83 year old female who was admitted for possible TIA. Pt demonstrates all bed mobility/transfers/ambulation at baseline level. Slight unsteadiness noted with ambulation, recommend use rollater during home discharge. Educated on safety precautions including tripping hazards. Strength/coordination WNL on B UE/LE. No focal neuro deficits noted. Pt does not require any further PT needs at this time. Pt will be dc in house and does not require follow up. RN aware. Will dc current orders.      Follow Up Recommendations No PT follow up    Equipment Recommendations  None recommended by PT    Recommendations for Other Services       Precautions / Restrictions Precautions Precautions: Fall Restrictions Weight Bearing Restrictions: No      Mobility  Bed Mobility Overal bed mobility: Independent             General bed mobility comments: safe technique performed  Transfers Overall transfer level: Independent Equipment used: None             General transfer comment: safe technique performed without assistance  Ambulation/Gait Ambulation/Gait assistance: Supervision Ambulation Distance (Feet): 200 Feet Assistive device: None Gait Pattern/deviations: Decreased step length - right     General Gait Details: Pt with slight unsteadiness that improves with further distance. Pt with decreased R side stepping, pt reports this is normal for her. No formal LOB noted. Slightly decreased gait speed  Stairs            Wheelchair Mobility    Modified Rankin (Stroke Patients Only)       Balance Overall balance assessment: No apparent  balance deficits (not formally assessed)                                           Pertinent Vitals/Pain Pain Assessment: No/denies pain    Home Living Family/patient expects to be discharged to:: Private residence Living Arrangements: Children (daughter) Available Help at Discharge: Family;Available 24 hours/day Type of Home: House Home Access: Stairs to enter Entrance Stairs-Rails: Can reach both Entrance Stairs-Number of Steps: 3 Home Layout: Two level;Able to live on main level with bedroom/bathroom Home Equipment: Dan Humphreys - 2 wheels;Walker - 4 wheels;Wheelchair - manual      Prior Function Level of Independence: Independent         Comments: Pt usually indep, however occasionally uses rollater for community distances     Hand Dominance        Extremity/Trunk Assessment   Upper Extremity Assessment Upper Extremity Assessment: Overall WFL for tasks assessed    Lower Extremity Assessment Lower Extremity Assessment: Overall WFL for tasks assessed       Communication   Communication: No difficulties  Cognition Arousal/Alertness: Awake/alert Behavior During Therapy: WFL for tasks assessed/performed Overall Cognitive Status: Within Functional Limits for tasks assessed                                        General Comments  Exercises     Assessment/Plan    PT Assessment Patent does not need any further PT services  PT Problem List         PT Treatment Interventions      PT Goals (Current goals can be found in the Care Plan section)  Acute Rehab PT Goals Patient Stated Goal: to go home PT Goal Formulation: All assessment and education complete, DC therapy Time For Goal Achievement: 12/25/16 Potential to Achieve Goals: Good    Frequency     Barriers to discharge        Co-evaluation               End of Session Equipment Utilized During Treatment: Gait belt Activity Tolerance: Patient tolerated  treatment well Patient left: in bed;with bed alarm set Nurse Communication: Mobility status PT Visit Diagnosis: Unsteadiness on feet (R26.81)    Time: 9604-5409 PT Time Calculation (min) (ACUTE ONLY): 11 min   Charges:   PT Evaluation $PT Eval Low Complexity: 1 Procedure     PT G Codes:   PT G-Codes **NOT FOR INPATIENT CLASS** Functional Assessment Tool Used: AM-PAC 6 Clicks Basic Mobility Functional Limitation: Mobility: Walking and moving around Mobility: Walking and Moving Around Current Status (W1191): 0 percent impaired, limited or restricted Mobility: Walking and Moving Around Goal Status (Y7829): 0 percent impaired, limited or restricted Mobility: Walking and Moving Around Discharge Status (F6213): 0 percent impaired, limited or restricted    Elizabeth Palau, PT, DPT [phone]   Colleen Carson 12/25/2016, 10:18 AM

## 2016-12-25 NOTE — Discharge Instructions (Addendum)
Follow up with Dr Deloria Lair for thyroid nodule Follow up with Dr Larwance Sachs for mini mental status exam  I changed simvastatin to atorvastatin

## 2016-12-25 NOTE — Progress Notes (Signed)
Discharge instructions given and went over with patient and daughter at bedside. Prescription given and reviewed. All questions answered. Patient discharged home with daughter via wheelchair by nursing staff. Bo Mcclintock, RN

## 2016-12-25 NOTE — Care Management Obs Status (Signed)
MEDICARE OBSERVATION STATUS NOTIFICATION   Patient Details  Name: Colleen Carson MRN: [id number] Date of Birth: Mar 02, 1925   Medicare Observation Status Notification Given:  Yes    Gwenette Greet, RN 12/25/2016, 8:12 AM

## 2016-12-26 LAB — HEMOGLOBIN A1C
Hgb A1c MFr Bld: 5.7 % — ABNORMAL HIGH (ref 4.8–5.6)
MEAN PLASMA GLUCOSE: 117 mg/dL

## 2017-01-23 NOTE — Progress Notes (Signed)
   10/02/16 1510  PT Time Calculation  PT Start Time (ACUTE ONLY) 1430  PT Stop Time (ACUTE ONLY) 1458  PT Time Calculation (min) (ACUTE ONLY) 28 min  PT G-Codes **NOT FOR INPATIENT CLASS**  Functional Assessment Tool Used Clinical judgement  Functional Limitation Mobility: Walking and moving around  Mobility: Walking and Moving Around Current Status (Z6109(G8978) CI  Mobility: Walking and Moving Around Goal Status (U0454(G8979) CI  PT General Charges  $$ ACUTE PT VISIT 1 Procedure  PT Evaluation  $PT Eval Low Complexity 1 Procedure  PT Treatments  $Therapeutic Exercise 8-22 mins   Late entry g-codes entered after review of initial evaluation/documentation by Elly ModenaScott Tanner, PT.  Tommy RainwaterKristen H. Manson PasseyBrown, PT, DPT, NCS 01/23/17, 1:57 PM [phone]

## 2017-02-27 ENCOUNTER — Observation Stay
Admission: EM | Admit: 2017-02-27 | Discharge: 2017-03-01 | Disposition: A | Discharge status: Home / Self Care | Payer: Medicare Other | Attending: Internal Medicine | Admitting: Internal Medicine

## 2017-02-27 ENCOUNTER — Encounter: Payer: Self-pay | Admitting: Emergency Medicine

## 2017-02-27 ENCOUNTER — Emergency Department: Payer: Medicare Other

## 2017-02-27 DIAGNOSIS — M1990 Unspecified osteoarthritis, unspecified site: Secondary | ICD-10-CM | POA: Diagnosis not present

## 2017-02-27 DIAGNOSIS — I5043 Acute on chronic combined systolic (congestive) and diastolic (congestive) heart failure: Secondary | ICD-10-CM | POA: Diagnosis not present

## 2017-02-27 DIAGNOSIS — I509 Heart failure, unspecified: Secondary | ICD-10-CM

## 2017-02-27 DIAGNOSIS — Z950 Presence of cardiac pacemaker: Secondary | ICD-10-CM | POA: Diagnosis not present

## 2017-02-27 DIAGNOSIS — E7800 Pure hypercholesterolemia, unspecified: Secondary | ICD-10-CM | POA: Diagnosis not present

## 2017-02-27 DIAGNOSIS — I2510 Atherosclerotic heart disease of native coronary artery without angina pectoris: Secondary | ICD-10-CM | POA: Insufficient documentation

## 2017-02-27 DIAGNOSIS — I480 Paroxysmal atrial fibrillation: Secondary | ICD-10-CM | POA: Diagnosis not present

## 2017-02-27 DIAGNOSIS — Z885 Allergy status to narcotic agent status: Secondary | ICD-10-CM | POA: Insufficient documentation

## 2017-02-27 DIAGNOSIS — I110 Hypertensive heart disease with heart failure: Secondary | ICD-10-CM | POA: Diagnosis not present

## 2017-02-27 DIAGNOSIS — R0600 Dyspnea, unspecified: Secondary | ICD-10-CM

## 2017-02-27 DIAGNOSIS — I160 Hypertensive urgency: Secondary | ICD-10-CM

## 2017-02-27 DIAGNOSIS — E0590 Thyrotoxicosis, unspecified without thyrotoxic crisis or storm: Secondary | ICD-10-CM | POA: Insufficient documentation

## 2017-02-27 DIAGNOSIS — E876 Hypokalemia: Secondary | ICD-10-CM | POA: Diagnosis not present

## 2017-02-27 DIAGNOSIS — E785 Hyperlipidemia, unspecified: Secondary | ICD-10-CM | POA: Insufficient documentation

## 2017-02-27 DIAGNOSIS — Z7982 Long term (current) use of aspirin: Secondary | ICD-10-CM | POA: Insufficient documentation

## 2017-02-27 DIAGNOSIS — Z96642 Presence of left artificial hip joint: Secondary | ICD-10-CM | POA: Diagnosis not present

## 2017-02-27 DIAGNOSIS — Z8673 Personal history of transient ischemic attack (TIA), and cerebral infarction without residual deficits: Secondary | ICD-10-CM | POA: Diagnosis not present

## 2017-02-27 LAB — TROPONIN I: Troponin I: <0.03 ng/mL (ref <0.03)

## 2017-02-27 LAB — BRAIN NATRIURETIC PEPTIDE: B NATRIURETIC PEPTIDE 5: 389 pg/mL — AB (ref 0.0–100.0)

## 2017-02-27 LAB — BASIC METABOLIC PANEL
ANION GAP: 7 (ref 5–15)
BUN: 20 mg/dL (ref 6–20)
CHLORIDE: 102 mmol/L (ref 101–111)
CO2: 28 mmol/L (ref 22–32)
CREATININE: 0.93 mg/dL (ref 0.44–1.00)
Calcium: 9.3 mg/dL (ref 8.9–10.3)
GFR calc non Af Amer: 53 mL/min — ABNORMAL LOW (ref >60)
Glucose, Bld: 119 mg/dL — ABNORMAL HIGH (ref 65–99)
POTASSIUM: 4 mmol/L (ref 3.5–5.1)
SODIUM: 137 mmol/L (ref 135–145)

## 2017-02-27 LAB — CBC
HEMATOCRIT: 43.6 % (ref 35.0–47.0)
Hemoglobin: 14.3 g/dL (ref 12.0–16.0)
MCH: 29.5 pg (ref 26.0–34.0)
MCHC: 32.7 g/dL (ref 32.0–36.0)
MCV: 90.2 fL (ref 80.0–100.0)
PLATELETS: 167 10*3/uL (ref 150–440)
RBC: 4.83 MIL/uL (ref 3.80–5.20)
RDW: 14.7 % — ABNORMAL HIGH (ref 11.5–14.5)
WBC: 6.6 10*3/uL (ref 3.6–11.0)

## 2017-02-27 MED ORDER — ACETAMINOPHEN 325 MG PO TABS
650.0000 mg | ORAL_TABLET | Freq: Four times a day (QID) | ORAL | Status: DC | PRN
  Administered 2017-02-28: 650 mg via ORAL
  Filled 2017-02-27: qty 2

## 2017-02-27 MED ORDER — FUROSEMIDE 40 MG PO TABS
20.0000 mg | ORAL_TABLET | Freq: Once | ORAL | Status: AC
  Administered 2017-02-27: 20 mg via ORAL
  Filled 2017-02-27: qty 1

## 2017-02-27 MED ORDER — ASPIRIN 81 MG PO CHEW: 324.0000 mg | CHEWABLE_TABLET | Freq: Once | ORAL | Status: DC

## 2017-02-27 MED ORDER — NITROGLYCERIN 0.4 MG SL SUBL
0.4000 mg | SUBLINGUAL_TABLET | Freq: Once | SUBLINGUAL | Status: AC
  Administered 2017-02-27: 0.4 mg via SUBLINGUAL
  Filled 2017-02-27: qty 1

## 2017-02-27 MED ORDER — NITROGLYCERIN 2 % TD OINT
1.0000 [in_us] | TOPICAL_OINTMENT | Freq: Once | TRANSDERMAL | Status: AC
  Administered 2017-02-27: 1 [in_us] via TOPICAL
  Filled 2017-02-27: qty 1

## 2017-02-27 NOTE — ED Notes (Signed)
Daughter [name] to nurses station and needs to go home to sleep. Cell phone is [phone] and is available to be called if needed.

## 2017-02-27 NOTE — H&P (Signed)
History and Physical   [facility] - Rosa Sanchez @ [facility] Admission History and Physical [employer] Holding Corporationlexis Yamel Bale, D.O.    Patient Name: Colleen MoundHilda Carson MR#: [id number] Date of Birth: 1925-03-25 Date of Admission: 02/27/2017  Referring MD/NP/PA: Dr. Mayford KnifeWilliams Primary Care Physician: Kandyce RudBabaoff, Marcus, MD Patient coming from: Home, lives with daughter Outpatient Specialists: Dr. Lady GaryFath   Chief Complaint:  Chief Complaint  Patient presents with  . Shortness of Breath    HPI: Colleen MoundHilda Cubillos is a 83 y.o. female with a known history of Atrial fibrillation, TIA, hyperthyroidism, hypertension, hyperlipidemiaComplete heart block status post pacemaker presents to the emergency department for evaluation of shortness of breath.  Patient was in a usual state of health until today when she developed acute onset and worsening shortness of breath and dyspnea on exertion. Patient denies fevers/chills, weakness, dizziness, chest pain, N/V/C/D, abdominal pain, dysuria/frequency, changes in mental status.    Otherwise there has been no change in status. Patient has been taking medication as prescribed and there has been no recent change in medication or diet.  No recent antibiotics.  There has been no recent illness, hospitalizations, travel or sick contacts.    EMS/ED Course: Patient received Lasix 20 mg by mouth, and nitroglycerin paste and nectar glycerin sublingual. She is feeling improvement however medical admission was requested for management of new onset of acute congestive heart failure.   Review of Systems:  CONSTITUTIONAL: No fever/chills, fatigue, weakness, weight gain/loss, headache. EYES: No blurry or double vision. ENT: No tinnitus, postnasal drip, redness or soreness of the oropharynx. RESPIRATORY: Positive dyspnea,. No cough, wheeze.  No hemoptysis.  CARDIOVASCULAR: No chest pain, palpitations, syncope, orthopnea. No lower extremity edema.  GASTROINTESTINAL: No nausea, vomiting, abdominal pain,  diarrhea, constipation.  No hematemesis, melena or hematochezia. GENITOURINARY: No dysuria, frequency, hematuria. ENDOCRINE: No polyuria or nocturia. No heat or cold intolerance. HEMATOLOGY: No anemia, bruising, bleeding. INTEGUMENTARY: No rashes, ulcers, lesions. MUSCULOSKELETAL: No arthritis, gout, dyspnea. NEUROLOGIC: No numbness, tingling, ataxia, seizure-type activity, weakness. PSYCHIATRIC: No anxiety, depression, insomnia.   Past Medical History:  Diagnosis Date  . Arthritis   . Atrial fibrillation (HCC)   . Dysrhythmia    Afib  . Hypercholesteremia   . Hypertension   . Lymphedema    lower extremities  . S/P total hip arthroplasty 01/25/2015  . Shingles   . Stroke [facility](HCC)    noted ischemia on CT scan    Past Surgical History:  Procedure Laterality Date  . JOINT REPLACEMENT  01/23/15   Left THR Dr. Rosita KeaMenz   . PACEMAKER INSERTION  2017  . TOTAL HIP ARTHROPLASTY Left 01/23/2015   Procedure: TOTAL HIP ARTHROPLASTY ANTERIOR APPROACH;  Surgeon: Kennedy BuckerMichael Menz, MD;  Location: [facility] ORS;  Service: Orthopedics;  Laterality: Left;     reports that she has never smoked. She has never used smokeless tobacco. She reports that she does not drink alcohol or use drugs.  Allergies  Allergen Reactions  . Morphine And Related Nausea And Vomiting    Family History   Medical History Relation Name Comments  Atrial fibrillation (Abnormal heart rhythm sometimes requiring treatment with blood thinners) Brother Rosanne AshingJim   Lung cancer Brother Everlean AlstromMaurice   Bone cancer Brother Earvin HansenGerald   Lung cancer Brother Peyton NajjarLarry   Arthritis Brother Joe knee replacement  Coronary Artery Disease (Blocked arteries around heart) Father    Heart disease Father    Lung cancer Maternal Grandfather    No Known Problems Maternal Grandmother    Blindness Mother    Glaucoma Mother  Prior to Admission medications   Medication Sig Start Date End Date Taking? Authorizing Provider  apixaban (ELIQUIS) 5 MG TABS  tablet Take 5 mg by mouth 2 (two) times daily.   Yes [provider]  aspirin EC 81 MG tablet Take 1 tablet (81 mg total) by mouth daily. 10/02/16  Yes Sudini, Wardell Heath, MD  atorvastatin (LIPITOR) 40 MG tablet Take 1 tablet (40 mg total) by mouth daily. 12/25/16  Yes Wieting, Richard, MD  metoprolol tartrate (LOPRESSOR) 25 MG tablet Take 1 tablet (25 mg total) by mouth 2 (two) times daily. 10/04/16 10/04/17 Yes Sharman Cheek, MD  Multiple Vitamins-Minerals (CENTRUM SILVER PO) Take 1 tablet by mouth daily.   Yes [provider]    Physical Exam: Vitals:   02/27/17 1937 02/27/17 1938  BP: (!) 181/84   Pulse: 69   Resp: 20   Temp: 97.6 F (36.4 C)   TempSrc: Oral   SpO2: 99%   Weight:  63.5 kg (140 lb)  Height:  4\' 11"  (1.499 m)    GENERAL: 83 y.o.-year-old female patient, well-developed, well-nourished lying in the bed in no acute distress.  Pleasant and cooperative.   HEENT: Head atraumatic, normocephalic. Pupils equal, round, reactive to light and accommodation. No scleral icterus. Extraocular muscles intact. Nares are patent. Oropharynx is clear. Mucus membranes moist. NECK: Supple, full range of motion. No JVD, no bruit heard. No thyroid enlargement, no tenderness, no cervical lymphadenopathy. CHEST: Normal breath sounds bilaterally. No wheezing, rales, rhonchi or crackles. No use of accessory muscles of respiration.  No reproducible chest wall tenderness.  CARDIOVASCULAR: S1, S2 normal. No murmurs, rubs, or gallops. Cap refill <2 seconds. Pulses intact distally.  ABDOMEN: Soft, nondistended, nontender. No rebound, guarding, rigidity. Normoactive bowel sounds present in all four quadrants. No organomegaly or mass. EXTREMITIES: Positive bilateral nonpitting pedal edema.  No calf tenderness or Homan's sign.  NEUROLOGIC: The patient is alert and oriented x 3. Cranial nerves II through XII are grossly intact with no focal sensorimotor deficit. Muscle strength 5/5 in all  extremities. Sensation intact. Gait not checked. PSYCHIATRIC:  Normal affect, mood, thought content. SKIN: Warm, dry, and intact without obvious rash, lesion, or ulcer.    Labs on Admission:  CBC:  Recent Labs Lab 02/27/17 1942  WBC 6.6  HGB 14.3  HCT 43.6  MCV 90.2  PLT 167   Basic Metabolic Panel:  Recent Labs Lab 02/27/17 1942  NA 137  K 4.0  CL 102  CO2 28  GLUCOSE 119*  BUN 20  CREATININE 0.93  CALCIUM 9.3   GFR: Estimated Creatinine Clearance: 33.2 mL/min (by C-G formula based on SCr of 0.93 mg/dL). Liver Function Tests: No results for input(s): AST, ALT, ALKPHOS, BILITOT, PROT, ALBUMIN in the last 168 hours. No results for input(s): LIPASE, AMYLASE in the last 168 hours. No results for input(s): AMMONIA in the last 168 hours. Coagulation Profile: No results for input(s): INR, PROTIME in the last 168 hours. Cardiac Enzymes:  Recent Labs Lab 02/27/17 1942  TROPONINI <0.03   BNP (last 3 results) No results for input(s): PROBNP in the last 8760 hours. HbA1C: No results for input(s): HGBA1C in the last 72 hours. CBG: No results for input(s): GLUCAP in the last 168 hours. Lipid Profile: No results for input(s): CHOL, HDL, LDLCALC, TRIG, CHOLHDL, LDLDIRECT in the last 72 hours. Thyroid Function Tests: No results for input(s): TSH, T4TOTAL, FREET4, T3FREE, THYROIDAB in the last 72 hours. Anemia Panel: No results for input(s): VITAMINB12, FOLATE, FERRITIN,  TIBC, IRON, RETICCTPCT in the last 72 hours. Urine analysis:    Component Value Date/Time   COLORURINE STRAW (A) 12/24/2016 1132   APPEARANCEUR CLEAR (A) 12/24/2016 1132   APPEARANCEUR Hazy 01/10/2015 1349   LABSPEC 1.005 12/24/2016 1132   LABSPEC 1.026 01/10/2015 1349   PHURINE 7.0 12/24/2016 1132   GLUCOSEU NEGATIVE 12/24/2016 1132   GLUCOSEU Negative 01/10/2015 1349   HGBUR NEGATIVE 12/24/2016 1132   BILIRUBINUR NEGATIVE 12/24/2016 1132   BILIRUBINUR Negative 01/10/2015 1349   KETONESUR  NEGATIVE 12/24/2016 1132   PROTEINUR NEGATIVE 12/24/2016 1132   NITRITE NEGATIVE 12/24/2016 1132   LEUKOCYTESUR NEGATIVE 12/24/2016 1132   LEUKOCYTESUR 1+ 01/10/2015 1349   Sepsis Labs: @LABRCNTIP (procalcitonin:4,lacticidven:4) )No results found for this or any previous visit (from the past 240 hour(s)).   Radiological Exams on Admission: Dg Chest 2 View  Result Date: 02/27/2017 CLINICAL DATA:  Shortness breath. Symptoms have progressed through the day. EXAM: CHEST  2 VIEW COMPARISON:  Two-view chest x-ray 10/04/2016 FINDINGS: The heart is enlarged. Pacing wires are stable. Atherosclerotic changes are present at the aortic arch and descending aorta. Interstitial coarsening is slightly more prominent than on the prior exam. There are no effusions. No significant airspace consolidation is present. The visualized soft tissues and bony thorax are otherwise unremarkable. IMPRESSION: 1. Mild cardiomegaly. 2. Slight increase and a diffuse interstitial pattern suggesting mild edema superimposed on chronic disease. This may represent early congestive heart failure. 3. No significant airspace consolidation or effusions. 4. Aortic atherosclerosis. Electronically Signed   By: Marin Roberts M.D.   On: 02/27/2017 20:12    EKG: AV dual paced at 67 bpm  Assessment/Plan  This is a 83 y.o. female with a history of  Atrial fibrillation, TIA, hyperthyroidism, hypertension, hyperlipidemia, Complete heart block status post pacemaker now being admitted with:  #. Acute exacerbation of congestive heart failure - Telemetry monitoring. - Continue metoprolol, add ace, Lasix and nitroglycerin - Intake/output, daily weight. - Trend troponins, check lipids and TSH. - Check echo - Cardiology consultation requested - Dr. Lady Gary  #. Uncontrolled hypertension - BP control -Continue metoprolol - Add ACE inhibitor  #.  History of paroxysmal atrial fibrillation -Continue eloquent, metoprolol  #. History of  hyperlipidemia - Continue Lipitor  Admission status: Observation, telemetry IV Fluids: Hep-Lock Diet/Nutrition: Heart healthy Consults called: Cardiology  DVT Px: Eliquis, SCDs and early ambulation. Code Status: Full Code  Disposition Plan: To home in 1-2 days  All the records are reviewed and case discussed with ED provider. Management plans discussed with the patient and/or family who express understanding and agree with plan of care.  Stockton Nunley D.O. on 02/27/2017 at 10:59 PM Between 7am to 6pm - Pager - [phone] After 6pm go to [email or web address] - Social research officer, government [facility] Office [phone] CC: Primary care physician; Kandyce Rud, MD   02/27/2017, 10:59 PM

## 2017-02-27 NOTE — ED Provider Notes (Signed)
[facility] Emergency Department Provider Note       Time seen: ----------------------------------------- 9:25 PM on 02/27/2017 -----------------------------------------     I have reviewed the triage vital signs and the nursing notes.   HISTORY   Chief Complaint Shortness of Breath    HPI Colleen Carson is a 83 y.o. female who presents to the ED for shortness of breath that started this afternoon. Patient states shortness of breath has become worse throughout the day. She denies any chest pain. She has never had a problem with this before, exertion seems to worsen it. She denies fevers, chills, chest pain, vomiting or diarrhea. She denies any recent fluid or weight gain.   Past Medical History:  Diagnosis Date  . Arthritis   . Atrial fibrillation (HCC)   . Dysrhythmia    Afib  . Hypercholesteremia   . Hypertension   . Lymphedema    lower extremities  . S/P total hip arthroplasty 01/25/2015  . Shingles   . Stroke [facility])    noted ischemia on CT scan    Patient Active Problem List   Diagnosis Date Noted  . TIA (transient ischemic attack) 12/24/2016  . Acute encephalopathy 10/01/2016  . Symptomatic bradycardia 05/15/2016  . HTN (hypertension) 05/15/2016  . HLD (hyperlipidemia) 05/15/2016  . Atrial fibrillation (HCC) 03/11/2016  . AKI (acute kidney injury) (HCC) 05/06/2015  . S/P total hip arthroplasty 01/25/2015  . Primary osteoarthritis of left hip 01/23/2015    Past Surgical History:  Procedure Laterality Date  . JOINT REPLACEMENT  01/23/15   Left THR Dr. Rosita Kea   . PACEMAKER INSERTION  2017  . TOTAL HIP ARTHROPLASTY Left 01/23/2015   Procedure: TOTAL HIP ARTHROPLASTY ANTERIOR APPROACH;  Surgeon: Kennedy Bucker, MD;  Location: [facility] ORS;  Service: Orthopedics;  Laterality: Left;    Allergies Morphine and related  Social History Social History  Substance Use Topics  . Smoking status: Never Smoker  . Smokeless tobacco: Never Used  .  Alcohol use No    Review of Systems Constitutional: Negative for fever. Eyes: Negative for vision changes ENT:  Negative for congestion, sore throat Cardiovascular: Negative for chest pain. Respiratory: Positive for shortness of breath and dyspnea on exertion Gastrointestinal: Negative for abdominal pain, vomiting and diarrhea. Genitourinary: Negative for dysuria. Musculoskeletal: Negative for back pain. Skin: Negative for rash. Neurological: Negative for headaches, focal weakness or numbness.  All systems negative/normal/unremarkable except as stated in the HPI  ____________________________________________   PHYSICAL EXAM:  VITAL SIGNS: ED Triage Vitals  Enc Vitals Group     BP 02/27/17 1937 (!) 181/84     Pulse Rate 02/27/17 1937 69     Resp 02/27/17 1937 20     Temp 02/27/17 1937 97.6 F (36.4 C)     Temp Source 02/27/17 1937 Oral     SpO2 02/27/17 1937 99 %     Weight 02/27/17 1938 140 lb (63.5 kg)     Height 02/27/17 1938 4\' 11"  (1.499 m)     Head Circumference --      Peak Flow --      Pain Score --      Pain Loc --      Pain Edu? --      Excl. in GC? --     Constitutional: Alert and oriented. Well appearing and in no distress. Eyes: Conjunctivae are normal. Normal extraocular movements. ENT   Head: Normocephalic and atraumatic.   Nose: No congestion/rhinnorhea.   Mouth/Throat: Mucous membranes are  moist.   Neck: No stridor. Cardiovascular: Normal rate, regular rhythm. No murmurs, rubs, or gallops. Respiratory: Normal respiratory effort without tachypnea nor retractions. Breath sounds are clear and equal bilaterally. No wheezes/rales/rhonchi. Gastrointestinal: Soft and nontender. Normal bowel sounds Musculoskeletal: Nontender with normal range of motion in extremities. No lower extremity tenderness nor edema. Neurologic:  Normal speech and language. No gross focal neurologic deficits are appreciated.  Skin:  Skin is warm, dry and intact. No rash  noted. Psychiatric: Mood and affect are normal. Speech and behavior are normal.  ____________________________________________  EKG: Interpreted by me.AV dual paced rhythm with a rate of 67 bpm  ____________________________________________  ED COURSE:  Pertinent labs & imaging results that were available during my care of the patient were reviewed by me and considered in my medical decision making (see chart for details). Patient presents for shortness of breath, we will assess with labs and imaging as indicated.   Procedures ____________________________________________   LABS (pertinent positives/negatives)  Labs Reviewed  BASIC METABOLIC PANEL - Abnormal; Notable for the following:       Result Value   Glucose, Bld 119 (*)    GFR calc non Af Amer 53 (*)    All other components within normal limits  CBC - Abnormal; Notable for the following:    RDW 14.7 (*)    All other components within normal limits  BRAIN NATRIURETIC PEPTIDE - Abnormal; Notable for the following:    B Natriuretic Peptide 389.0 (*)    All other components within normal limits  TROPONIN I    RADIOLOGY Chest x-ray  IMPRESSION: 1. Mild cardiomegaly. 2. Slight increase and a diffuse interstitial pattern suggesting mild edema superimposed on chronic disease. This may represent early congestive heart failure. 3. No significant airspace consolidation or effusions. 4. Aortic atherosclerosis.  ____________________________________________  FINAL ASSESSMENT AND PLAN  Shortness of breath, Hypertensive urgency  Plan: Patient's labs and imaging were dictated above. Patient had presented for worsening shortness of breath over the past several days and much worse today. She has dyspnea on exertion. Based on labs and imaging this is likely new onset CHF. We have started her on nitroglycerin for blood pressure control and Lasix. She will likely benefit from observation, cardiology consultation and  echocardiogram.   Emily FilbertWilliams, Pedrohenrique Mcconville E, MD   Note: This note was generated in part or whole with voice recognition software. Voice recognition is usually quite accurate but there are transcription errors that can and very often do occur. I apologize for any typographical errors that were not detected and corrected.     Emily FilbertWilliams, Chynah Orihuela E, MD 02/27/17 2233

## 2017-02-27 NOTE — ED Triage Notes (Signed)
Patient with complaint of shortness of breath that started this afternoon. Patient states that the shortness of breath has become worse throughout the day. Patient denies chest pain.

## 2017-02-27 NOTE — ED Notes (Signed)
Pt was assisted to the restroom.  

## 2017-02-27 NOTE — ED Notes (Signed)
Pt family at desk to inquire about wait, family told pt is next in lobby to go back but can't give specific time, family verbalized understanding

## 2017-02-28 ENCOUNTER — Observation Stay
Admit: 2017-02-28 | Discharge: 2017-02-28 | Disposition: A | Discharge status: Home / Self Care | Payer: Medicare Other | Attending: Family Medicine | Admitting: Family Medicine

## 2017-02-28 DIAGNOSIS — I110 Hypertensive heart disease with heart failure: Secondary | ICD-10-CM | POA: Diagnosis not present

## 2017-02-28 LAB — LIPID PANEL
CHOL/HDL RATIO: 2.1 ratio
Cholesterol: 142 mg/dL (ref 0–200)
HDL: 68 mg/dL (ref >40)
LDL CALC: 61 mg/dL (ref 0–99)
Triglycerides: 66 mg/dL (ref <150)
VLDL: 13 mg/dL (ref 0–40)

## 2017-02-28 LAB — BASIC METABOLIC PANEL
ANION GAP: 8 (ref 5–15)
BUN: 18 mg/dL (ref 6–20)
CALCIUM: 9.4 mg/dL (ref 8.9–10.3)
CO2: 30 mmol/L (ref 22–32)
Chloride: 104 mmol/L (ref 101–111)
Creatinine, Ser: 0.74 mg/dL (ref 0.44–1.00)
Glucose, Bld: 100 mg/dL — ABNORMAL HIGH (ref 65–99)
Potassium: 3.5 mmol/L (ref 3.5–5.1)
SODIUM: 142 mmol/L (ref 135–145)

## 2017-02-28 LAB — ECHOCARDIOGRAM COMPLETE
Height: 59 in
Weight: 2195.2 oz

## 2017-02-28 LAB — TROPONIN I: Troponin I: <0.03 ng/mL (ref <0.03)

## 2017-02-28 LAB — TSH: TSH: 2.019 u[IU]/mL (ref 0.350–4.500)

## 2017-02-28 MED ORDER — POTASSIUM CHLORIDE 20 MEQ PO PACK
20.0000 meq | PACK | Freq: Every day | ORAL | Status: DC
  Administered 2017-03-01: 20 meq via ORAL
  Filled 2017-02-28: qty 1

## 2017-02-28 MED ORDER — NITROGLYCERIN 0.4 MG SL SUBL: 0.4000 mg | SUBLINGUAL_TABLET | SUBLINGUAL | Status: DC | PRN

## 2017-02-28 MED ORDER — ONDANSETRON HCL 4 MG/2ML IJ SOLN: 4.0000 mg | Freq: Four times a day (QID) | INTRAMUSCULAR | Status: DC | PRN

## 2017-02-28 MED ORDER — ATORVASTATIN CALCIUM 20 MG PO TABS: 40.0000 mg | ORAL_TABLET | Freq: Every day | ORAL | Status: DC

## 2017-02-28 MED ORDER — SODIUM CHLORIDE 0.9 % IV SOLN: 250.0000 mL | INTRAVENOUS | Status: DC | PRN

## 2017-02-28 MED ORDER — SODIUM CHLORIDE 0.9% FLUSH
3.0000 mL | Freq: Two times a day (BID) | INTRAVENOUS | Status: DC
  Administered 2017-02-28 (×3): 3 mL via INTRAVENOUS

## 2017-02-28 MED ORDER — ADULT MULTIVITAMIN W/MINERALS CH
1.0000 | ORAL_TABLET | Freq: Every day | ORAL | Status: DC
  Administered 2017-03-01: 1 via ORAL
  Filled 2017-02-28 (×2): qty 1

## 2017-02-28 MED ORDER — NITROGLYCERIN 2 % TD OINT
0.5000 [in_us] | TOPICAL_OINTMENT | Freq: Four times a day (QID) | TRANSDERMAL | Status: DC
  Administered 2017-02-28 (×4): 0.5 [in_us] via TOPICAL
  Filled 2017-02-28 (×3): qty 1

## 2017-02-28 MED ORDER — ASPIRIN EC 81 MG PO TBEC: 81.0000 mg | DELAYED_RELEASE_TABLET | Freq: Every day | ORAL | Status: DC

## 2017-02-28 MED ORDER — APIXABAN 5 MG PO TABS
5.0000 mg | ORAL_TABLET | Freq: Two times a day (BID) | ORAL | Status: DC
  Administered 2017-02-28 – 2017-03-01 (×3): 5 mg via ORAL
  Filled 2017-02-28 (×4): qty 1

## 2017-02-28 MED ORDER — SODIUM CHLORIDE 0.9% FLUSH: 3.0000 mL | INTRAVENOUS | Status: DC | PRN

## 2017-02-28 MED ORDER — METOPROLOL TARTRATE 25 MG PO TABS
25.0000 mg | ORAL_TABLET | Freq: Two times a day (BID) | ORAL | Status: DC
  Administered 2017-02-28 – 2017-03-01 (×3): 25 mg via ORAL
  Filled 2017-02-28 (×4): qty 1

## 2017-02-28 MED ORDER — FUROSEMIDE 20 MG PO TABS
20.0000 mg | ORAL_TABLET | Freq: Every day | ORAL | Status: DC
  Administered 2017-03-01: 20 mg via ORAL
  Filled 2017-02-28: qty 1

## 2017-02-28 MED ORDER — FUROSEMIDE 10 MG/ML IJ SOLN
20.0000 mg | Freq: Every day | INTRAMUSCULAR | Status: DC
  Filled 2017-02-28: qty 2

## 2017-02-28 MED ORDER — LISINOPRIL 5 MG PO TABS
5.0000 mg | ORAL_TABLET | Freq: Every day | ORAL | Status: DC
  Administered 2017-02-28 – 2017-03-01 (×2): 5 mg via ORAL
  Filled 2017-02-28 (×3): qty 1

## 2017-02-28 NOTE — Progress Notes (Signed)
Patient arrived to 2A Room 237. Patient denies pain and all questions answered. Patient oriented to unit and Fall Safety Plan signed. Skin assessment completed with Yasmin RN and skin intact. A&Ox4, VSS, and A-V paced on verified tele-box #[id number]. Nursing staff will continue to monitor for any changes in patient status. Lamonte RicherKara A Yetunde Leis, RN

## 2017-02-28 NOTE — Consult Note (Signed)
[facility]  CARDIOLOGY CONSULT NOTE  Patient ID: Colleen Carson MRN: [id number] DOB/AGE: Apr 09, 1925 83 y.o.  Admit date: 02/27/2017 Referring Physician Nemiah CommanderKalisetti Primary Physician [facility] Primary Cardiologist Fath Reason for Consultation Congestive heart failure  HPI: 83 year old female referred for evaluation of congestive heart failure. The patient has known history of essential hypertension, TIA, paroxysmal atrial fibrillation, status post AV node ablation, with dual-chamber pacemaker. Patient has 2-3 day history of increasing shortness of breath. Pres[facility] room with signs and symptoms consistent with acute congestive heart failure, treated with intravenous furosemide, with diuresis and overall clinical improvement. Patient denies chest pain. Admission labs were notable for troponin less than 0.033. ECG reveals atrial and ventricular pacing.  Review of systems complete and found to be negative unless listed above     Past Medical History:  Diagnosis Date  . Arthritis   . Atrial fibrillation (HCC)   . Dysrhythmia    Afib  . Hypercholesteremia   . Hypertension   . Lymphedema    lower extremities  . S/P total hip arthroplasty 01/25/2015  . Shingles   . Stroke [facility](HCC)    noted ischemia on CT scan    Past Surgical History:  Procedure Laterality Date  . JOINT REPLACEMENT  01/23/15   Left THR Dr. Rosita KeaMenz   . PACEMAKER INSERTION  2017  . TOTAL HIP ARTHROPLASTY Left 01/23/2015   Procedure: TOTAL HIP ARTHROPLASTY ANTERIOR APPROACH;  Surgeon: Kennedy BuckerMichael Menz, MD;  Location: [facility] ORS;  Service: Orthopedics;  Laterality: Left;    Prescriptions Prior to Admission  Medication Sig Dispense Refill Last Dose  . apixaban (ELIQUIS) 5 MG TABS tablet Take 5 mg by mouth 2 (two) times daily.   UNKNOWN at UNKNOWN  . aspirin EC 81 MG tablet Take 1 tablet (81 mg total) by mouth daily.   UNKNOWN at UNKNOWN  . atorvastatin (LIPITOR) 40 MG tablet Take 1 tablet (40 mg total) by mouth daily. 30  tablet 0   . metoprolol tartrate (LOPRESSOR) 25 MG tablet Take 1 tablet (25 mg total) by mouth 2 (two) times daily. 60 tablet 0 UNKNOWN at UNKNOWN  . Multiple Vitamins-Minerals (CENTRUM SILVER PO) Take 1 tablet by mouth daily.   UNKNOWN at UNKNOWN   Social History   Social History  . Marital status: Married    Spouse name: N/A  . Number of children: N/A  . Years of education: N/A   Occupational History  . Not on file.   Social History Main Topics  . Smoking status: Never Smoker  . Smokeless tobacco: Never Used  . Alcohol use No  . Drug use: No  . Sexual activity: Not on file   Other Topics Concern  . Not on file   Social History Narrative  . No narrative on file    Family History  Problem Relation Age of Onset  . Hypertension Other       Review of systems complete and found to be negative unless listed above      PHYSICAL EXAM  General: Well developed, well nourished, in no acute distress HEENT:  Normocephalic and atramatic Neck:  No JVD.  Lungs: Clear bilaterally to auscultation and percussion. Heart: HRRR . Normal S1 and S2 without gallops or murmurs.  Abdomen: Bowel sounds are positive, abdomen soft and non-tender  Msk:  Back normal, normal gait. Normal strength and tone for age. Extremities: No clubbing, cyanosis or edema.   Neuro: Alert and oriented X 3. Psych:  Good affect, responds appropriately  Labs:   Lab Results  Component Value Date   WBC 6.6 02/27/2017   HGB 14.3 02/27/2017   HCT 43.6 02/27/2017   MCV 90.2 02/27/2017   PLT 167 02/27/2017    Recent Labs Lab 02/28/17 0621  NA 142  K 3.5  CL 104  CO2 30  BUN 18  CREATININE 0.74  CALCIUM 9.4  GLUCOSE 100*   Lab Results  Component Value Date   CKTOTAL 40 09/29/2014   CKMB 2.1 09/29/2014   TROPONINI <0.03 02/28/2017    Lab Results  Component Value Date   CHOL 142 02/28/2017   CHOL 160 12/25/2016   CHOL 157 10/02/2016   Lab Results  Component Value Date   HDL 68 02/28/2017    HDL 84 12/25/2016   HDL 86 10/02/2016   Lab Results  Component Value Date   LDLCALC 61 02/28/2017   LDLCALC 65 12/25/2016   LDLCALC 59 10/02/2016   Lab Results  Component Value Date   TRIG 66 02/28/2017   TRIG 56 12/25/2016   TRIG 58 10/02/2016   Lab Results  Component Value Date   CHOLHDL 2.1 02/28/2017   CHOLHDL 1.9 12/25/2016   CHOLHDL 1.8 10/02/2016   No results found for: LDLDIRECT    Radiology: Dg Chest 2 View  Result Date: 02/27/2017 CLINICAL DATA:  Shortness breath. Symptoms have progressed through the day. EXAM: CHEST  2 VIEW COMPARISON:  Two-view chest x-ray 10/04/2016 FINDINGS: The heart is enlarged. Pacing wires are stable. Atherosclerotic changes are present at the aortic arch and descending aorta. Interstitial coarsening is slightly more prominent than on the prior exam. There are no effusions. No significant airspace consolidation is present. The visualized soft tissues and bony thorax are otherwise unremarkable. IMPRESSION: 1. Mild cardiomegaly. 2. Slight increase and a diffuse interstitial pattern suggesting mild edema superimposed on chronic disease. This may represent early congestive heart failure. 3. No significant airspace consolidation or effusions. 4. Aortic atherosclerosis. Electronically Signed   By: Marin Roberts M.D.   On: 02/27/2017 20:12    EKG: AV pacing  ASSESSMENT AND PLAN:   1. Acute on chronic diastolic CHF, much improved after initial diuresis 2. Paroxysmal atrial fibrillation, status post AV node ablation, on Eliquis for stroke prevention  Recommendations  1. Agree with overall current therapy 2. Continue diuresis 3. Carefully monitor renal status 4. Continue Eliquis for stroke prevention 5. DC aspirin ( which adds very little benefit on top of Eliquis but increases bleeding risk ) 6. Review 2-D echocardiogram  Signed: Marcina Millard MD,PhD, [facility] 02/28/2017, 8:52 AM

## 2017-02-28 NOTE — Care Management Obs Status (Signed)
MEDICARE OBSERVATION STATUS NOTIFICATION   Patient Details  Name: Colleen DawleyHilda M Carson MRN: [id number] Date of Birth: 1925-07-10   Medicare Observation Status Notification Given:  Yes Waynetta Sandy(Moon letter)    Jolee Ewingockett,Rollan Roger A, RN 02/28/2017, 2:39 PM

## 2017-02-28 NOTE — Progress Notes (Signed)
[employer] [facility] [place] [facility]   PATIENT NAME: Colleen Carson    MR#:  [id number]  DATE OF BIRTH:  1924/12/01  SUBJECTIVE:  CHIEF COMPLAINT:   Chief Complaint  Patient presents with  . Shortness of Breath    REVIEW OF SYSTEMS:  Review of Systems  Constitutional: Negative for chills, fever and malaise/fatigue.  HENT: Negative for congestion, hearing loss, sinus pain, sore throat and tinnitus.   Eyes: Negative for blurred vision and double vision.  Respiratory: Negative for cough, shortness of breath and wheezing.   Cardiovascular: Positive for orthopnea. Negative for chest pain and palpitations.  Gastrointestinal: Negative for abdominal pain, constipation, diarrhea, nausea and vomiting.  Genitourinary: Negative for dysuria.  Musculoskeletal: Negative for myalgias.  Neurological: Negative for dizziness, speech change, focal weakness, seizures and headaches.  Psychiatric/Behavioral: Negative for depression.    DRUG ALLERGIES:   Allergies  Allergen Reactions  . Morphine And Related Nausea And Vomiting    VITALS:  Blood pressure 127/79, pulse 61, temperature 97.8 F (36.6 C), temperature source Oral, resp. rate (!) 22, height 4\' 11"  (1.499 m), weight 62.2 kg (137 lb 3.2 oz), SpO2 95 %.  PHYSICAL EXAMINATION:  Physical Exam  GENERAL:  83 y.o.-year-old patient lying in the bed with no acute distress.  EYES: Pupils equal, round, reactive to light and accommodation. No scleral icterus. Extraocular muscles intact.  HEENT: Head atraumatic, normocephalic. Oropharynx and nasopharynx clear.  NECK:  Supple, no jugular venous distention. No thyroid enlargement, no tenderness.  LUNGS: Normal breath sounds bilaterally, no wheezing, rales,rhonchi or crepitation. No use of accessory muscles of respiration.  CARDIOVASCULAR: S1, S2 normal. No rubs, or gallops. 2/6 systolic murmur present ABDOMEN: Soft, nontender, nondistended. Bowel sounds present. No organomegaly or  mass.  EXTREMITIES: No pedal edema, cyanosis, or clubbing.  NEUROLOGIC: Cranial nerves II through XII are intact. Muscle strength 5/5 in all extremities. Sensation intact. Gait not checked.  PSYCHIATRIC: The patient is alert and oriented x 3.  SKIN: No obvious rash, lesion, or ulcer.    LABORATORY PANEL:   CBC  Recent Labs Lab 02/27/17 1942  WBC 6.6  HGB 14.3  HCT 43.6  PLT 167   ------------------------------------------------------------------------------------------------------------------  Chemistries   Recent Labs Lab 02/28/17 0621  NA 142  K 3.5  CL 104  CO2 30  GLUCOSE 100*  BUN 18  CREATININE 0.74  CALCIUM 9.4   ------------------------------------------------------------------------------------------------------------------  Cardiac Enzymes  Recent Labs Lab 02/28/17 0621  TROPONINI <0.03   ------------------------------------------------------------------------------------------------------------------  RADIOLOGY:  Dg Chest 2 View  Result Date: 02/27/2017 CLINICAL DATA:  Shortness breath. Symptoms have progressed through the day. EXAM: CHEST  2 VIEW COMPARISON:  Two-view chest x-ray 10/04/2016 FINDINGS: The heart is enlarged. Pacing wires are stable. Atherosclerotic changes are present at the aortic arch and descending aorta. Interstitial coarsening is slightly more prominent than on the prior exam. There are no effusions. No significant airspace consolidation is present. The visualized soft tissues and bony thorax are otherwise unremarkable. IMPRESSION: 1. Mild cardiomegaly. 2. Slight increase and a diffuse interstitial pattern suggesting mild edema superimposed on chronic disease. This may represent early congestive heart failure. 3. No significant airspace consolidation or effusions. 4. Aortic atherosclerosis. Electronically Signed   By: Marin Roberts M.D.   On: 02/27/2017 20:12    EKG:   Orders placed or performed during the hospital encounter  of 02/27/17  . ED EKG within 10 minutes  . ED EKG within 10 minutes  . EKG 12-Lead  .  EKG 12-Lead    ASSESSMENT AND PLAN:   83 year old female with past medical history significant for arthritis, hypertension, history of paroxysmal atrial fibrillation status post AV node ablation with dual-chamber pacemaker presents to hospital secondary to worsening shortness of breath.  #1 acute congestive heart failure-unknown ejection fraction at this time. Echocardiogram is pending. -Appreciate cardiology consult. -Likely secondary to increased sodium intake in the last week according to family. -Sodium restriction, fluid restriction explained. -Continue Lasix IV daily and change to oral from tomorrow. -Not needing supplemental oxygen at this time.  #2 atrial fibrillation-status post ablation, continue metoprolol. -On eliquis for anticoagulation. No indication for aspirin at this time so discontinue it.  #3 hyperlipidemia-continue statin.  #4 hypokalemia-secondary to Lasix. Being replaced  #5 DVT prophylaxis-on eliquis   Physical therapy pending.    All the records are reviewed and case discussed with Care Management/Social Workerr. Management plans discussed with the patient, family and they are in agreement.  CODE STATUS: Full Code  TOTAL TIME TAKING CARE OF THIS PATIENT: 37 minutes.   POSSIBLE D/C TOMORROW, DEPENDING ON CLINICAL CONDITION.   Isidora Laham M.D on 02/28/2017 at 12:19 PM  Between 7am to 6pm - Pager - [phone]  After 6pm go to [email or web address] - Social research officer, governmentpassword EPAS [facility]  [facility]  Office  [phone]  CC: Primary care physician; Kandyce RudBabaoff, Marcus, MD

## 2017-03-01 DIAGNOSIS — I110 Hypertensive heart disease with heart failure: Secondary | ICD-10-CM | POA: Diagnosis not present

## 2017-03-01 LAB — BASIC METABOLIC PANEL
Anion gap: 9 (ref 5–15)
BUN: 24 mg/dL — AB (ref 6–20)
CO2: 30 mmol/L (ref 22–32)
CREATININE: 0.98 mg/dL (ref 0.44–1.00)
Calcium: 9 mg/dL (ref 8.9–10.3)
Chloride: 99 mmol/L — ABNORMAL LOW (ref 101–111)
GFR calc Af Amer: 58 mL/min — ABNORMAL LOW (ref >60)
GFR, EST NON AFRICAN AMERICAN: 50 mL/min — AB (ref >60)
GLUCOSE: 110 mg/dL — AB (ref 65–99)
Potassium: 4 mmol/L (ref 3.5–5.1)
SODIUM: 138 mmol/L (ref 135–145)

## 2017-03-01 MED ORDER — FUROSEMIDE 20 MG PO TABS: 20.0000 mg | ORAL_TABLET | Freq: Every day | ORAL | 1 refills | Status: DC

## 2017-03-01 MED ORDER — LISINOPRIL 5 MG PO TABS: 5.0000 mg | ORAL_TABLET | Freq: Every day | ORAL | 1 refills | Status: DC

## 2017-03-01 NOTE — Progress Notes (Signed)
Pt to be discharged today. Iv and tele removed. disch instructions and prescrips given to pt and son in law to their understanding. disch via w.c. Accompanied by him

## 2017-03-01 NOTE — Progress Notes (Signed)
PT Cancellation Note  Patient Details Name: Lynwood DawleyHilda M Donatelli MRN: [id number] DOB: 23-Oct-1924   Cancelled Treatment:    Reason Eval/Treat Not Completed: PT screened, no needs identified, will sign off; Nursing reports patient up to restroom independently.  Myrene GalasWesley Murvin Gift, PT DPT 03/01/17, 12:45 PM [phone]  Myrene GalasWesley Timika Muench 03/01/2017, 12:44 PM

## 2017-03-01 NOTE — Discharge Summary (Signed)
[employer] [facility] [place] [facility]   PATIENT NAME: Colleen MoundHilda Cullins    MR#:  [id number]  DATE OF BIRTH:  12-09-1924  DATE OF ADMISSION:  02/27/2017   ADMITTING PHYSICIAN: Tonye RoyaltyAlexis Hugelmeyer, DO  DATE OF DISCHARGE: 03/01/17  PRIMARY CARE PHYSICIAN: Kandyce RudBabaoff, Marcus, MD   ADMISSION DIAGNOSIS:   Hypertensive urgency [I16.0] Dyspnea, unspecified type [R06.00]  DISCHARGE DIAGNOSIS:   Active Problems:   Acute CHF (congestive heart failure) (HCC)   SECONDARY DIAGNOSIS:   Past Medical History:  Diagnosis Date  . Arthritis   . Atrial fibrillation (HCC)   . Dysrhythmia    Afib  . Hypercholesteremia   . Hypertension   . Lymphedema    lower extremities  . S/P total hip arthroplasty 01/25/2015  . Shingles   . Stroke [facility](HCC)    noted ischemia on CT scan    HOSPITAL COURSE:   83 year old female with past medical history significant for arthritis, hypertension, history of paroxysmal atrial fibrillation status post AV node ablation with dual-chamber pacemaker presents to hospital secondary to worsening shortness of breath.  #1 Acute systolic congestive heart failure- ECHO with ejection fraction of 40%- reduced compared to ECHO from 6 months ago when EF was normal -Appreciate cardiology consult. -also had secondary to increased sodium intake in the last week according to family. -Sodium restriction, fluid restriction explained. -started on oral lasix and also lisinopril added. Ambulating well -Not needing supplemental oxygen at this time.  #2 atrial fibrillation-status post ablation, continue metoprolol. -On eliquis for anticoagulation. No indication for aspirin at this time so discontinue it.  #3 hyperlipidemia-continue statin.  #4 hypokalemia-secondary to Lasix. Replaced. Also will be on lisinopril at discharge  Stable and being discharged today   DISCHARGE CONDITIONS:   Guarded  CONSULTS OBTAINED:   Treatment Team:  Marcina MillardParaschos, Alexander,  MD  DRUG ALLERGIES:   Allergies  Allergen Reactions  . Morphine And Related Nausea And Vomiting   DISCHARGE MEDICATIONS:   Allergies as of 03/01/2017      Reactions   Morphine And Related Nausea And Vomiting      Medication List    STOP taking these medications   aspirin EC 81 MG tablet     TAKE these medications   atorvastatin 40 MG tablet Commonly known as:  LIPITOR Take 1 tablet (40 mg total) by mouth daily.   CENTRUM SILVER PO Take 1 tablet by mouth daily.   ELIQUIS 5 MG Tabs tablet Generic drug:  apixaban Take 5 mg by mouth 2 (two) times daily.   furosemide 20 MG tablet Commonly known as:  LASIX Take 1 tablet (20 mg total) by mouth daily.   lisinopril 5 MG tablet Commonly known as:  PRINIVIL,ZESTRIL Take 1 tablet (5 mg total) by mouth daily.   metoprolol tartrate 25 MG tablet Commonly known as:  LOPRESSOR Take 1 tablet (25 mg total) by mouth 2 (two) times daily.        DISCHARGE INSTRUCTIONS:   1. PCP follow-up in 3 weeks 3. Cardiac follow-up in 2 weeks 3. CHF clinic follow up 4. Low-salt diet advised  DIET:   Cardiac diet  ACTIVITY:   Activity as tolerated  OXYGEN:   Home Oxygen: No.  Oxygen Delivery: room air  DISCHARGE LOCATION:   home   If you experience worsening of your admission symptoms, develop shortness of breath, life threatening emergency, suicidal or homicidal thoughts you must seek medical attention immediately by calling 911 or calling your MD immediately  if symptoms  less severe.  You Must read complete instructions/literature along with all the possible adverse reactions/side effects for all the Medicines you take and that have been prescribed to you. Take any new Medicines after you have completely understood and accpet all the possible adverse reactions/side effects.   Please note  You were cared for by a hospitalist during your hospital stay. If you have any questions about your discharge medications or the care  you received while you were in the hospital after you are discharged, you can call the unit and asked to speak with the hospitalist on call if the hospitalist that took care of you is not available. Once you are discharged, your primary care physician will handle any further medical issues. Please note that NO REFILLS for any discharge medications will be authorized once you are discharged, as it is imperative that you return to your primary care physician (or establish a relationship with a primary care physician if you do not have one) for your aftercare needs so that they can reassess your need for medications and monitor your lab values.    On the day of Discharge:  VITAL SIGNS:   Blood pressure 103/69, pulse 99, temperature 98 F (36.7 C), temperature source Oral, resp. rate 16, height 4\' 11"  (1.499 m), weight 61.3 kg (135 lb 1.6 oz), SpO2 97 %.  PHYSICAL EXAMINATION:    GENERAL:  83 y.o.-year-old patient lying in the bed with no acute distress.  EYES: Pupils equal, round, reactive to light and accommodation. No scleral icterus. Extraocular muscles intact.  HEENT: Head atraumatic, normocephalic. Oropharynx and nasopharynx clear.  NECK:  Supple, no jugular venous distention. No thyroid enlargement, no tenderness.  LUNGS: Normal breath sounds bilaterally, no wheezing, rales,rhonchi or crepitation. No use of accessory muscles of respiration.  CARDIOVASCULAR: S1, S2 normal. No rubs, or gallops. 2/6 systolic murmur present ABDOMEN: Soft, nontender, nondistended. Bowel sounds present. No organomegaly or mass.  EXTREMITIES: No pedal edema, cyanosis, or clubbing.  NEUROLOGIC: Cranial nerves II through XII are intact. Muscle strength 5/5 in all extremities. Sensation intact. Gait not checked.  PSYCHIATRIC: The patient is alert and oriented x 3.  SKIN: No obvious rash, lesion, or ulcer.    DATA REVIEW:   CBC  Recent Labs Lab 02/27/17 1942  WBC 6.6  HGB 14.3  HCT 43.6  PLT 167     Chemistries   Recent Labs Lab 03/01/17 0409  NA 138  K 4.0  CL 99*  CO2 30  GLUCOSE 110*  BUN 24*  CREATININE 0.98  CALCIUM 9.0     Microbiology Results  No results found for this or any previous visit.  RADIOLOGY:  No results found.   Management plans discussed with the patient, family and they are in agreement.  CODE STATUS:     Code Status Orders        Start     Ordered   02/28/17 0023  Full code  Continuous     02/28/17 0022    Code Status History    Date Active Date Inactive Code Status Order ID Comments User Context   12/25/2016 11:53 AM 12/25/2016  3:47 PM DNR [id number]  Bo Mcclintock, RN Inpatient   12/24/2016  3:03 PM 12/25/2016 11:53 AM Full Code [id number]  Adrian Saran, MD Inpatient   12/24/2016 11:26 AM 12/24/2016  3:03 PM DNR [id number]  Adrian Saran, MD ED   10/02/2016 12:41 AM 10/02/2016 10:12 PM DNR [id number]  Oralia Manis, MD Inpatient   05/15/2016  2:19 AM 05/15/2016  3:19 PM DNR [id number]  Oralia Manis, MD Inpatient   03/11/2016  8:39 AM 03/13/2016  4:09 PM DNR [id number]  Wyatt Haste, MD ED   03/11/2016  8:39 AM 03/11/2016  8:39 AM Full Code [id number]  Hower, Cletis Athens, MD ED   05/06/2015  3:01 AM 05/07/2015  3:02 PM DNR [id number]  Arnaldo Natal, MD Inpatient   05/06/2015  1:57 AM 05/06/2015  3:01 AM Full Code [id number]  Arnaldo Natal, MD ED   01/23/2015 11:21 AM 01/26/2015  4:25 PM Full Code [id number]  Kennedy Bucker, MD Inpatient    Advance Directive Documentation     Most Recent Value  Type of Advance Directive  Healthcare Power of Attorney, Living will  Pre-existing out of facility DNR order (yellow form or pink MOST form)  -  "MOST" Form in Place?  -      TOTAL TIME TAKING CARE OF THIS PATIENT: 38 minutes.    Enid Baas M.D on 03/01/2017 at 8:25 AM  Between 7am to 6pm - Pager - [phone]  After 6pm go to [email or web address] - Social research officer, government  [facility] [facility]  Office  [phone]  CC: Primary care  physician; Kandyce Rud, MD   Note: This dictation was prepared with Dragon dictation along with smaller phrase technology. Any transcriptional errors that result from this process are unintentional.

## 2017-03-03 ENCOUNTER — Other Ambulatory Visit: Payer: Self-pay

## 2017-03-03 ENCOUNTER — Emergency Department
Admission: EM | Admit: 2017-03-03 | Discharge: 2017-03-03 | Disposition: A | Discharge status: Home / Self Care | Payer: Medicare Other | Attending: Emergency Medicine | Admitting: Emergency Medicine

## 2017-03-03 ENCOUNTER — Encounter: Payer: Self-pay | Admitting: Emergency Medicine

## 2017-03-03 ENCOUNTER — Emergency Department: Payer: Medicare Other

## 2017-03-03 DIAGNOSIS — R0602 Shortness of breath: Secondary | ICD-10-CM | POA: Diagnosis present

## 2017-03-03 DIAGNOSIS — Z79899 Other long term (current) drug therapy: Secondary | ICD-10-CM | POA: Diagnosis not present

## 2017-03-03 DIAGNOSIS — Z96642 Presence of left artificial hip joint: Secondary | ICD-10-CM | POA: Insufficient documentation

## 2017-03-03 DIAGNOSIS — Z7901 Long term (current) use of anticoagulants: Secondary | ICD-10-CM | POA: Diagnosis not present

## 2017-03-03 DIAGNOSIS — I509 Heart failure, unspecified: Secondary | ICD-10-CM | POA: Insufficient documentation

## 2017-03-03 DIAGNOSIS — I110 Hypertensive heart disease with heart failure: Secondary | ICD-10-CM | POA: Insufficient documentation

## 2017-03-03 DIAGNOSIS — Z8673 Personal history of transient ischemic attack (TIA), and cerebral infarction without residual deficits: Secondary | ICD-10-CM | POA: Diagnosis not present

## 2017-03-03 HISTORY — DX: Heart failure, unspecified: I50.9

## 2017-03-03 LAB — URINALYSIS, COMPLETE (UACMP) WITH MICROSCOPIC
Bacteria, UA: NONE SEEN
Bilirubin Urine: NEGATIVE
Glucose, UA: NEGATIVE mg/dL
Hgb urine dipstick: NEGATIVE
Ketones, ur: NEGATIVE mg/dL
Leukocytes, UA: NEGATIVE
Nitrite: NEGATIVE
PROTEIN: NEGATIVE mg/dL
SPECIFIC GRAVITY, URINE: 1.005 (ref 1.005–1.030)
pH: 7 (ref 5.0–8.0)

## 2017-03-03 LAB — CBC
HCT: 45.3 % (ref 35.0–47.0)
HEMOGLOBIN: 15.2 g/dL (ref 12.0–16.0)
MCH: 29.7 pg (ref 26.0–34.0)
MCHC: 33.6 g/dL (ref 32.0–36.0)
MCV: 88.2 fL (ref 80.0–100.0)
PLATELETS: 178 10*3/uL (ref 150–440)
RBC: 5.14 MIL/uL (ref 3.80–5.20)
RDW: 14.7 % — ABNORMAL HIGH (ref 11.5–14.5)
WBC: 7.3 10*3/uL (ref 3.6–11.0)

## 2017-03-03 LAB — BASIC METABOLIC PANEL
Anion gap: 10 (ref 5–15)
BUN: 33 mg/dL — ABNORMAL HIGH (ref 6–20)
CHLORIDE: 98 mmol/L — AB (ref 101–111)
CO2: 28 mmol/L (ref 22–32)
CREATININE: 1.19 mg/dL — AB (ref 0.44–1.00)
Calcium: 9.3 mg/dL (ref 8.9–10.3)
GFR calc non Af Amer: 39 mL/min — ABNORMAL LOW (ref >60)
GFR, EST AFRICAN AMERICAN: 46 mL/min — AB (ref >60)
Glucose, Bld: 144 mg/dL — ABNORMAL HIGH (ref 65–99)
Potassium: 4.5 mmol/L (ref 3.5–5.1)
Sodium: 136 mmol/L (ref 135–145)

## 2017-03-03 LAB — TSH: TSH: 0.928 u[IU]/mL (ref 0.350–4.500)

## 2017-03-03 LAB — BRAIN NATRIURETIC PEPTIDE: B NATRIURETIC PEPTIDE 5: 94 pg/mL (ref 0.0–100.0)

## 2017-03-03 LAB — TROPONIN I

## 2017-03-03 LAB — T4, FREE: Free T4: 1.08 ng/dL (ref 0.61–1.12)

## 2017-03-03 MED ORDER — SODIUM CHLORIDE 0.9 % IV BOLUS (SEPSIS)
250.0000 mL | Freq: Once | INTRAVENOUS | Status: AC
  Administered 2017-03-03: 250 mL via INTRAVENOUS

## 2017-03-03 NOTE — ED Notes (Signed)
REDS VEST READING= 20 CHEST RULER=18  VEST FITTING TASKS: POSTURE=sitting HEIGHT MARKER=s CENTER STRIP=alligned  COMMENTS:approved by [employer]

## 2017-03-03 NOTE — ED Notes (Signed)
Bladder scan = 23ml

## 2017-03-03 NOTE — ED Provider Notes (Signed)
[facility] Emergency Department Provider Note  ____________________________________________  Time seen: Approximately 12:23 PM  I have reviewed the triage vital signs and the nursing notes.   HISTORY  Chief Complaint Shortness of Breath   HPI BRIDGITTE Carson is a 83 y.o. female with a history of atrial fibrillation on Eliquis, pacemaker, CHF with EF of 40%, hypertension, lymphedema, stroke who presents for evaluation of difficulty breathing.Patient was discharged from the hospital 2 days ago when she was admitted for a CHF exacerbation. Patient reports that she felt better yesterday but this morning started feeling more fatigued. She tells me that she does not have difficulty breathing she just feels like she is unable to take a deep breath. She denies shortness of breath with exertion, she denies chest pain, she denies leg pain, she denies fever or chills, she denies orthopnea and has been sleeping on one pillow. She has a clear cough that is mild. Her weight has been unchanged since leaving the hospital. Her son is at the bedside and thinks her problem is due to anxiety. He tells me that since going home patient has been very anxious about what she eats and it's very afraid of recollecting the fluid and needing to be rehospitalized. He said that yesterday and today she barely ate anything. Patient also endorses that she feels like she is unable to fully empty her bladder since going home. She denies dysuria or hematuria. She denies abdominal pain, nausea, vomiting, diarrhea.  Past Medical History:  Diagnosis Date  . Arthritis   . Atrial fibrillation (HCC)   . CHF (congestive heart failure) (HCC)   . Dysrhythmia    Afib  . Hypercholesteremia   . Hypertension   . Lymphedema    lower extremities  . S/P total hip arthroplasty 01/25/2015  . Shingles   . Stroke [facility])    noted ischemia on CT scan    Patient Active Problem List   Diagnosis Date Noted  . Acute  CHF (congestive heart failure) (HCC) 02/27/2017  . TIA (transient ischemic attack) 12/24/2016  . Acute encephalopathy 10/01/2016  . Symptomatic bradycardia 05/15/2016  . HTN (hypertension) 05/15/2016  . HLD (hyperlipidemia) 05/15/2016  . Atrial fibrillation (HCC) 03/11/2016  . AKI (acute kidney injury) (HCC) 05/06/2015  . S/P total hip arthroplasty 01/25/2015  . Primary osteoarthritis of left hip 01/23/2015    Past Surgical History:  Procedure Laterality Date  . JOINT REPLACEMENT  01/23/15   Left THR Dr. Rosita Kea   . PACEMAKER INSERTION  2017  . TOTAL HIP ARTHROPLASTY Left 01/23/2015   Procedure: TOTAL HIP ARTHROPLASTY ANTERIOR APPROACH;  Surgeon: Kennedy Bucker, MD;  Location: [facility] ORS;  Service: Orthopedics;  Laterality: Left;    Prior to Admission medications   Medication Sig Start Date End Date Taking? Authorizing Provider  apixaban (ELIQUIS) 5 MG TABS tablet Take 5 mg by mouth 2 (two) times daily.   Yes [provider]  atorvastatin (LIPITOR) 40 MG tablet Take 1 tablet (40 mg total) by mouth daily. Patient taking differently: Take 40 mg by mouth at bedtime.  12/25/16  Yes Wieting, Richard, MD  furosemide (LASIX) 20 MG tablet Take 1 tablet (20 mg total) by mouth daily. 03/01/17  Yes Enid Baas, MD  lisinopril (PRINIVIL,ZESTRIL) 5 MG tablet Take 1 tablet (5 mg total) by mouth daily. 03/01/17  Yes Enid Baas, MD  metoprolol tartrate (LOPRESSOR) 25 MG tablet Take 1 tablet (25 mg total) by mouth 2 (two) times daily. 10/04/16 10/04/17 Yes Scotty Court,  Aneta MinsPhillip, MD    Allergies Morphine and related  Family History  Problem Relation Age of Onset  . Hypertension Other     Social History Social History  Substance Use Topics  . Smoking status: Never Smoker  . Smokeless tobacco: Never Used  . Alcohol use No    Review of Systems  Constitutional: Negative for fever. Eyes: Negative for visual changes. ENT: Negative for sore throat. Neck: No neck pain  Cardiovascular:  Negative for chest pain. Respiratory: Negative for shortness of breath. + difficulty taking a deep breath Gastrointestinal: Negative for abdominal pain, vomiting or diarrhea. Genitourinary: Negative for dysuria. Musculoskeletal: Negative for back pain. Skin: Negative for rash. Neurological: Negative for headaches, weakness or numbness. Psych: No SI or HI  ____________________________________________   PHYSICAL EXAM:  VITAL SIGNS: ED Triage Vitals  Enc Vitals Group     BP 03/03/17 1022 128/81     Pulse Rate 03/03/17 1022 73     Resp 03/03/17 1022 16     Temp 03/03/17 1023 97.9 F (36.6 C)     Temp Source 03/03/17 1022 Oral     SpO2 03/03/17 1022 99 %     Weight 03/03/17 1018 135 lb (61.2 kg)     Height 03/03/17 1018 4\' 11"  (1.499 m)     Head Circumference --      Peak Flow --      Pain Score --      Pain Loc --      Pain Edu? --      Excl. in GC? --     Constitutional: Alert and oriented. Well appearing and in no apparent distress. HEENT:      Head: Normocephalic and atraumatic.         Eyes: Conjunctivae are normal. Sclera is non-icteric.       Mouth/Throat: Mucous membranes are moist.       Neck: Supple with no signs of meningismus. Cardiovascular: Regular rate and rhythm. No murmurs, gallops, or rubs. 2+ symmetrical distal pulses are present in all extremities. No JVD. Respiratory: Normal respiratory effort. Lungs are clear to auscultation bilaterally. No wheezes, crackles, or rhonchi.  Gastrointestinal: Soft, non tender, and non distended with positive bowel sounds. No rebound or guarding. Musculoskeletal: changes of chronic  Lymphedema, no erythema or warmth Neurologic: Normal speech and language. Face is symmetric. Moving all extremities. No gross focal neurologic deficits are appreciated. Skin: Skin is warm, dry and intact. No rash noted. Psychiatric: Mood and affect are normal. Speech and behavior are normal.  ____________________________________________     LABS (all labs ordered are listed, but only abnormal results are displayed)  Labs Reviewed  BASIC METABOLIC PANEL - Abnormal; Notable for the following:       Result Value   Chloride 98 (*)    Glucose, Bld 144 (*)    BUN 33 (*)    Creatinine, Ser 1.19 (*)    GFR calc non Af Amer 39 (*)    GFR calc Af Amer 46 (*)    All other components within normal limits  CBC - Abnormal; Notable for the following:    RDW 14.7 (*)    All other components within normal limits  URINALYSIS, COMPLETE (UACMP) WITH MICROSCOPIC - Abnormal; Notable for the following:    Color, Urine STRAW (*)    APPearance CLEAR (*)    Squamous Epithelial / LPF 0-5 (*)    All other components within normal limits  TROPONIN I  BRAIN NATRIURETIC PEPTIDE  TROPONIN  I  TSH  T4, FREE  CBG MONITORING, ED   ____________________________________________  EKG  ED ECG REPORT I, Nita Sickle, the attending physician, personally viewed and interpreted this ECG.  Atrial sensed ventricular paced rhythm, rate of 67, prolonged PR interval, RBBB, normal QTc, left axis deviation, T-wave inversions in 1 and aVL and V2, no ST elevation. EKG unchanged from prior ____________________________________________  RADIOLOGY  CXR:  1. No acute cardiopulmonary disease. 2. Stable appearance from the prior study with lung hyperexpansion mild lower lung zone interstitial thickening and chronic bronchitic change. ____________________________________________   PROCEDURES  Procedure(s) performed: None Procedures Critical Care performed:  None ____________________________________________   INITIAL IMPRESSION / ASSESSMENT AND PLAN / ED COURSE  83 y.o. female with a history of atrial fibrillation on Eliquis, pacemaker, CHF with EF of 40%, hypertension, lymphedema, stroke who presents for evaluation of difficulty taking a deep breath. Patient has been very anxious since being discharged from the hospital and concerned about eating at  home and regaining the fluid. She hasn't been eating and drinking for the last 2 days. There is no signs of volume overload. She has normal JVD, lungs are clear, normal sats, chest x-ray with no evidence of pulmonary edema. CHF vest at 20%, BNP 94, and stable weight since discharge. I have low suspicion for PE since patient is on Eliquis and has no chest pain, no tachycardia, no tachypnea, no oxygen requirement and no constant shortness of breath. UA with no evidence of UTI. Her creatinine is slightly up at 1.19 (baseline 0.9). Will give gentle hydration of 250cc and patient will be dc with close f/u at CHF clinic tomorrow.    _________________________ 2:21 PM on 03/03/2017 -----------------------------------------  Troponin 2 negative. The patient remains extremely well-appearing, tolerating by mouth. He received gentle hydration. She was provided with document about zones for heart failure. She will follow-up with CHF clinic tomorrow. Patient be discharged home at this time.  Pertinent labs & imaging results that were available during my care of the patient were reviewed by me and considered in my medical decision making (see chart for details).    ____________________________________________   FINAL CLINICAL IMPRESSION(S) / ED DIAGNOSES  Final diagnoses:  SOB (shortness of breath)      NEW MEDICATIONS STARTED DURING THIS VISIT:  New Prescriptions   No medications on file     Note:  This document was prepared using Dragon voice recognition software and may include unintentional dictation errors.    Don Perking, [place], MD 03/03/17 [phone]

## 2017-03-03 NOTE — ED Notes (Signed)
Pt presents with sob since this morning. She was recently released from hospital for CHF treatment. Pt states she feels weak and like she might pass out. She spent most of yesterday in bed, but felt worse this morning.

## 2017-03-03 NOTE — ED Triage Notes (Signed)
C/O SOB and weakness today.  Onset of symptoms this morning at 0800.

## 2017-03-04 ENCOUNTER — Ambulatory Visit: Payer: Medicare Other | Attending: Family | Admitting: Family

## 2017-03-04 ENCOUNTER — Encounter: Payer: Self-pay | Admitting: Family

## 2017-03-04 VITALS — BP 111/73 | HR 62 | Resp 20 | Ht 60.0 in | Wt 138.1 lb

## 2017-03-04 DIAGNOSIS — Z79899 Other long term (current) drug therapy: Secondary | ICD-10-CM | POA: Diagnosis not present

## 2017-03-04 DIAGNOSIS — I890 Lymphedema, not elsewhere classified: Secondary | ICD-10-CM | POA: Insufficient documentation

## 2017-03-04 DIAGNOSIS — I10 Essential (primary) hypertension: Secondary | ICD-10-CM

## 2017-03-04 DIAGNOSIS — I4891 Unspecified atrial fibrillation: Secondary | ICD-10-CM | POA: Insufficient documentation

## 2017-03-04 DIAGNOSIS — F419 Anxiety disorder, unspecified: Secondary | ICD-10-CM | POA: Insufficient documentation

## 2017-03-04 DIAGNOSIS — I5022 Chronic systolic (congestive) heart failure: Secondary | ICD-10-CM | POA: Diagnosis not present

## 2017-03-04 DIAGNOSIS — M1990 Unspecified osteoarthritis, unspecified site: Secondary | ICD-10-CM | POA: Diagnosis not present

## 2017-03-04 DIAGNOSIS — Z7901 Long term (current) use of anticoagulants: Secondary | ICD-10-CM | POA: Diagnosis not present

## 2017-03-04 DIAGNOSIS — I110 Hypertensive heart disease with heart failure: Secondary | ICD-10-CM | POA: Diagnosis present

## 2017-03-04 DIAGNOSIS — Z8673 Personal history of transient ischemic attack (TIA), and cerebral infarction without residual deficits: Secondary | ICD-10-CM | POA: Insufficient documentation

## 2017-03-04 NOTE — Patient Instructions (Addendum)
Continue weighing daily and call for an overnight weight gain of > 2 pounds or a weekly weight gain of >5 pounds.  Decrease furosemide to every other day. Monitor weight and edema closely.

## 2017-03-04 NOTE — Progress Notes (Signed)
Patient ID: Colleen DawleyHilda M Carson, female    DOB: 1924/11/11, 83 y.o.   MRN: [id number]  HPI   Colleen Carson is a 83 y/o female with a history of arthritis, atrial fibrillation, hyperlipidemia, HTN, lymphedema, shingles, CVA and chronic heart failure.  Reviewed echo report done 02/28/17 which showed an EF of 40-45% along with moderate AS, mild MR and mild/mod TR. PA pressure at 31 mm Hg.   Was in the ED 03/03/17 due to the sensation that she can't take a deep breath. Hasn't been eating/drinking much and has been under some stress. Evaluated and discharged home. Admitted 02/27/17 with HF exacerbation. Cardiology consult obtained. Discharged home after 2 days. Admitted 12/24/16 with TIA with slurred speech. Head CT and EEG were negative. Discharged the following day. Was in the ED 10/04/16 due to generalized weakness. Evaluated and discharged home. Admitted 10/01/16 due to a TIA. Neurology consult obtained. Discharged home with PT the next day.   She presents today for her initial visit with a chief complaint of shortness of breath with minimal exertion. She says that this has been present for a few months with varying degrees of severity. She has associated fatigue, light-headedness and anxiety along with this.   Past Medical History:  Diagnosis Date  . Arthritis   . Atrial fibrillation (HCC)   . CHF (congestive heart failure) (HCC)   . Dysrhythmia    Afib  . Hypercholesteremia   . Hypertension   . Lymphedema    lower extremities  . S/P total hip arthroplasty 01/25/2015  . Shingles   . Stroke [facility](HCC)    noted ischemia on CT scan   Past Surgical History:  Procedure Laterality Date  . JOINT REPLACEMENT  01/23/15   Left THR Dr. Rosita KeaMenz   . PACEMAKER INSERTION  2017  . TOTAL HIP ARTHROPLASTY Left 01/23/2015   Procedure: TOTAL HIP ARTHROPLASTY ANTERIOR APPROACH;  Surgeon: Kennedy BuckerMichael Menz, MD;  Location: [facility] ORS;  Service: Orthopedics;  Laterality: Left;   Family History  Problem Relation Age of Onset  . Hypertension  Other    Social History  Substance Use Topics  . Smoking status: Never Smoker  . Smokeless tobacco: Never Used  . Alcohol use No   Allergies  Allergen Reactions  . Morphine And Related Nausea And Vomiting   Prior to Admission medications   Medication Sig Start Date End Date Taking? Authorizing Provider  apixaban (ELIQUIS) 5 MG TABS tablet Take 5 mg by mouth 2 (two) times daily.   Yes [provider]  atorvastatin (LIPITOR) 40 MG tablet Take 1 tablet (40 mg total) by mouth daily. Patient taking differently: Take 40 mg by mouth at bedtime.  12/25/16  Yes Wieting, Richard, MD  furosemide (LASIX) 20 MG tablet Take 1 tablet (20 mg total) by mouth daily. 03/01/17  Yes Enid BaasKalisetti, Radhika, MD  lisinopril (PRINIVIL,ZESTRIL) 5 MG tablet Take 1 tablet (5 mg total) by mouth daily. 03/01/17  Yes Enid BaasKalisetti, Radhika, MD  metoprolol tartrate (LOPRESSOR) 25 MG tablet Take 1 tablet (25 mg total) by mouth 2 (two) times daily. 10/04/16 10/04/17 Yes Sharman CheekStafford, Phillip, MD     Review of Systems  Constitutional: Positive for appetite change (decreased) and fatigue.  HENT: Negative for congestion, postnasal drip and sore throat.   Eyes: Negative.   Respiratory: Positive for shortness of breath. Negative for cough and chest tightness.   Cardiovascular: Negative for chest pain, palpitations and leg swelling.  Gastrointestinal: Negative for abdominal distention and abdominal pain.  Endocrine: Negative.  Genitourinary: Negative.   Musculoskeletal: Negative for back pain and neck pain.  Skin: Negative.   Allergic/Immunologic: Negative.   Neurological: Positive for dizziness and light-headedness.  Hematological: Negative for adenopathy. Does not bruise/bleed easily.  Psychiatric/Behavioral: Negative for dysphoric mood and sleep disturbance (sleeping on 1 pillow). The patient is nervous/anxious.    Vitals:   03/04/17 1124  BP: 111/73  Pulse: 62  Resp: 20  SpO2: 99%  Weight: 138 lb 2 oz (62.7 kg)   Height: 5' (1.524 m)   Wt Readings from Last 3 Encounters:  03/04/17 138 lb 2 oz (62.7 kg)  03/03/17 137 lb 3.2 oz (62.2 kg)  03/01/17 135 lb 1.6 oz (61.3 kg)   Lab Results  Component Value Date   CREATININE 1.19 (H) 03/03/2017   CREATININE 0.98 03/01/2017   CREATININE 0.74 02/28/2017   Physical Exam  Constitutional: She is oriented to person, place, and time. She appears well-developed and well-nourished.  HENT:  Head: Normocephalic and atraumatic.  Neck: Normal range of motion. Neck supple. No JVD present.  Cardiovascular: Normal rate and regular rhythm.   Pulmonary/Chest: Effort normal. She has no wheezes. She has no rales.  Abdominal: Soft. She exhibits no distension. There is no tenderness.  Musculoskeletal: She exhibits edema (lymphedema in bilateral lower legs). She exhibits no tenderness.  Neurological: She is alert and oriented to person, place, and time.  Skin: Skin is warm and dry.  Psychiatric: Her behavior is normal. Thought content normal. Her mood appears anxious.  Nursing note and vitals reviewed.   Assessment & Plan:  1: Chronic heart failure with reduced ejection fraction- - NYHA class III - euvolemic today - already weighing daily and says that she has continued to lose weight. Advised to call for an overnight weight gain of >2 pounds or a weekly weight gain of >5 pounds - not adding salt to her food and her daughter is reading food labels and cooking foods from scratch. Discussed the importance of closely following a 2000mg  sodium diet and written dietary information was given to her about this - admits to not eating/drinking much as she's "scared about retaining fluid". Encouraged her to follow a 2000mg  sodium diet but that she also needs to eat adequate amounts and stay hydrated.  - encouraged her to drink 40-50 ounces of fluid daily - does have compression socks that she sometimes wears for her lymphedema; does elevate her legs - HF vest reading in the  ED yesterday was 20% - sees cardiologist (Fath) 03/09/17  2: HTN- - BP on the low side and patient is experiencing dizziness - lisinopril and furosemide are new medications - advised patient and daughter to decrease her furosemide to every other day and monitor her weight and edema closely - would like for the dizziness to resolve and her BP to improve so that we could switch her lisinopril to entresto.  3: Anxiety- - patient admits that she's feeling anxious about various family members and her health - a brother suddenly died and she has 2 sisters that are currently having health problems - her husband recently fell and had to be hospitalized - spent some time talking about how anxiety can affect her health - encouraged her to follow-up with her PCP regarding this  Patient did not bring her medications nor a list. Each medication was verbally reviewed with the patient and she was encouraged to bring the bottles to every visit to confirm accuracy of list.  Return here in 2 weeks or a  sooner for any questions/problems before then.

## 2017-03-05 ENCOUNTER — Telehealth: Payer: Self-pay | Admitting: Family

## 2017-03-05 NOTE — Telephone Encounter (Signed)
Patient felt good in the bed this morning and then got up and ate a banana and took her medications. Shortly after, she began to feel weak and dizzy and tired.   BP today was 121/82, HR 61.  Have already decreased her furosemide to every other day. Only other new medication is the lisinopril. Advised daughter to decrease her lisinopril to 1/2 tablet daily which would be 2.5mg  daily. Patient's daughter verbalized understanding.   Sees cardiologist Colleen Carson(Fath) on 03/09/17 and her PCP on 03/12/17. Should symptoms worsen, daughter understands to take her to the ED.

## 2017-03-18 ENCOUNTER — Ambulatory Visit: Payer: Medicare Other | Attending: Family | Admitting: Family

## 2017-03-18 ENCOUNTER — Encounter: Payer: Self-pay | Admitting: Family

## 2017-03-18 VITALS — BP 142/80 | Resp 20 | Ht 60.0 in | Wt 140.5 lb

## 2017-03-18 DIAGNOSIS — B029 Zoster without complications: Secondary | ICD-10-CM | POA: Diagnosis not present

## 2017-03-18 DIAGNOSIS — I5022 Chronic systolic (congestive) heart failure: Secondary | ICD-10-CM | POA: Insufficient documentation

## 2017-03-18 DIAGNOSIS — Z8249 Family history of ischemic heart disease and other diseases of the circulatory system: Secondary | ICD-10-CM | POA: Insufficient documentation

## 2017-03-18 DIAGNOSIS — I4891 Unspecified atrial fibrillation: Secondary | ICD-10-CM | POA: Diagnosis not present

## 2017-03-18 DIAGNOSIS — R42 Dizziness and giddiness: Secondary | ICD-10-CM | POA: Diagnosis not present

## 2017-03-18 DIAGNOSIS — Z96642 Presence of left artificial hip joint: Secondary | ICD-10-CM | POA: Insufficient documentation

## 2017-03-18 DIAGNOSIS — I110 Hypertensive heart disease with heart failure: Secondary | ICD-10-CM | POA: Diagnosis present

## 2017-03-18 DIAGNOSIS — I890 Lymphedema, not elsewhere classified: Secondary | ICD-10-CM | POA: Diagnosis not present

## 2017-03-18 DIAGNOSIS — Z79899 Other long term (current) drug therapy: Secondary | ICD-10-CM | POA: Diagnosis not present

## 2017-03-18 DIAGNOSIS — Z888 Allergy status to other drugs, medicaments and biological substances status: Secondary | ICD-10-CM | POA: Diagnosis not present

## 2017-03-18 DIAGNOSIS — F419 Anxiety disorder, unspecified: Secondary | ICD-10-CM | POA: Diagnosis not present

## 2017-03-18 DIAGNOSIS — Z8673 Personal history of transient ischemic attack (TIA), and cerebral infarction without residual deficits: Secondary | ICD-10-CM | POA: Diagnosis not present

## 2017-03-18 DIAGNOSIS — M1990 Unspecified osteoarthritis, unspecified site: Secondary | ICD-10-CM | POA: Insufficient documentation

## 2017-03-18 DIAGNOSIS — Z950 Presence of cardiac pacemaker: Secondary | ICD-10-CM | POA: Diagnosis not present

## 2017-03-18 DIAGNOSIS — E7800 Pure hypercholesterolemia, unspecified: Secondary | ICD-10-CM | POA: Insufficient documentation

## 2017-03-18 DIAGNOSIS — Z7901 Long term (current) use of anticoagulants: Secondary | ICD-10-CM | POA: Insufficient documentation

## 2017-03-18 DIAGNOSIS — I10 Essential (primary) hypertension: Secondary | ICD-10-CM

## 2017-03-18 NOTE — Patient Instructions (Signed)
Continue weighing daily and call for an overnight weight gain of > 2 pounds or a weekly weight gain of >5 pounds. 

## 2017-03-18 NOTE — Progress Notes (Signed)
Patient ID: Colleen Carson, female    DOB: October 10, 1924, 83 y.o.   MRN: [id number]  HPI   Colleen Carson is a 83 y/o female with a history of arthritis, atrial fibrillation, hyperlipidemia, HTN, lymphedema, shingles, CVA and chronic heart failure.  Reviewed echo report done 02/28/17 which showed an EF of 40-45% along with moderate AS, mild MR and mild/mod TR. PA pressure at 31 mm Hg.   Was in the ED 03/03/17 due to the sensation that she can't take a deep breath. Hasn't been eating/drinking much and has been under some stress. Evaluated and discharged home. Admitted 02/27/17 with HF exacerbation. Cardiology consult obtained. Discharged home after 2 days. Admitted 12/24/16 with TIA with slurred speech. Head CT and EEG were negative. Discharged the following day. Was in the ED 10/04/16 due to generalized weakness. Evaluated and discharged home. Admitted 10/01/16 due to a TIA. Neurology consult obtained. Discharged home with PT the next day.   She presents today for her follow-up visit with a chief complaint of mild fatigue with moderate exertion. She says her fatigue has been present for months but has improved since medications have been adjusted. She has associated light-headedness along with this.   Past Medical History:  Diagnosis Date  . Arthritis   . Atrial fibrillation (HCC)   . CHF (congestive heart failure) (HCC)   . Dysrhythmia    Afib  . Hypercholesteremia   . Hypertension   . Lymphedema    lower extremities  . S/P total hip arthroplasty 01/25/2015  . Shingles   . Stroke [facility])    noted ischemia on CT scan   Past Surgical History:  Procedure Laterality Date  . JOINT REPLACEMENT  01/23/15   Left THR Dr. Rosita Kea   . PACEMAKER INSERTION  2017  . TOTAL HIP ARTHROPLASTY Left 01/23/2015   Procedure: TOTAL HIP ARTHROPLASTY ANTERIOR APPROACH;  Surgeon: Kennedy Bucker, MD;  Location: [facility] ORS;  Service: Orthopedics;  Laterality: Left;   Family History  Problem Relation Age of Onset  . Hypertension Other     Social History  Substance Use Topics  . Smoking status: Never Smoker  . Smokeless tobacco: Never Used  . Alcohol use No   Allergies  Allergen Reactions  . Morphine And Related Nausea And Vomiting    Prior to Admission medications   Medication Sig Start Date End Date Taking? Authorizing Provider  apixaban (ELIQUIS) 5 MG TABS tablet Take 5 mg by mouth 2 (two) times daily.   Yes [provider]  atorvastatin (LIPITOR) 40 MG tablet Take 1 tablet (40 mg total) by mouth daily. Patient taking differently: Take 40 mg by mouth at bedtime.  12/25/16  Yes Wieting, Richard, MD  furosemide (LASIX) 20 MG tablet Take 20 mg by mouth as needed.    Yes [provider]  metoprolol tartrate (LOPRESSOR) 25 MG tablet Take 1 tablet (25 mg total) by mouth 2 (two) times daily. 10/04/16 10/04/17 Yes Sharman Cheek, MD   Review of Systems  Constitutional: Positive for fatigue. Negative for appetite change.  HENT: Negative for congestion, postnasal drip and sore throat.   Eyes: Negative.   Respiratory: Negative for cough, chest tightness and shortness of breath.   Cardiovascular: Negative for chest pain, palpitations and leg swelling.  Gastrointestinal: Negative for abdominal distention and abdominal pain.  Endocrine: Negative.   Genitourinary: Negative.   Musculoskeletal: Negative for back pain and neck pain.  Skin: Negative.   Allergic/Immunologic: Negative.   Neurological: Positive for light-headedness (when waking  first thing in the morning). Negative for dizziness and weakness.  Hematological: Negative for adenopathy. Does not bruise/bleed easily.  Psychiatric/Behavioral: Negative for dysphoric mood and sleep disturbance (sleeping on 1 pillow). The patient is nervous/anxious.    Vitals:   03/18/17 1212  BP: (!) 142/80  Resp: 20  Weight: 140 lb 8 oz (63.7 kg)  Height: 5' (1.524 m)   Wt Readings from Last 3 Encounters:  03/18/17 140 lb 8 oz (63.7 kg)  03/04/17 138 lb 2 oz  (62.7 kg)  03/03/17 137 lb 3.2 oz (62.2 kg)    Lab Results  Component Value Date   CREATININE 1.19 (H) 03/03/2017   CREATININE 0.98 03/01/2017   CREATININE 0.74 02/28/2017   Physical Exam  Constitutional: She is oriented to person, place, and time. She appears well-developed and well-nourished.  HENT:  Head: Normocephalic and atraumatic.  Neck: Normal range of motion. Neck supple. No JVD present.  Cardiovascular: Normal rate and regular rhythm.   Pulmonary/Chest: Effort normal. She has no wheezes. She has no rales.  Abdominal: Soft. She exhibits no distension. There is no tenderness.  Musculoskeletal: She exhibits edema (lymphedema in bilateral lower legs). She exhibits no tenderness.  Neurological: She is alert and oriented to person, place, and time.  Skin: Skin is warm and dry.  Psychiatric: Her behavior is normal. Thought content normal. Her mood appears anxious.  Nursing note and vitals reviewed.   Assessment & Plan:  1: Chronic heart failure with reduced ejection fraction- - NYHA class II - euvolemic today - weight stable by our scale. Advised to call for an overnight weight gain of >2 pounds or a weekly weight gain of >5 pounds - not adding salt to her food and her daughter is reading food labels and cooking foods from scratch. Discussed the importance of closely following a 2000mg  sodium diet - encouraged her to drink 40-50 ounces of fluid daily - does have compression socks that she sometimes wears for her lymphedema; does elevate her legs - lisinopril has been stopped completely with improvement of her symptoms - interested in lungworks so that she knows how much exercise she can do. Referral placed.  - saw cardiologist (Fath) 03/09/17  2: HTN- - BP elevated initially at 141/111 and rechecked with a manual cuff was 142/80 - could always try adding low dose entresto in the future - saw PCP Colleen Carson(Colleen Carson) 03/12/17  3: Anxiety- - patient admits that she continues to feel  anxious but that the anxiety has improved as she's begun to feel better.   Patient did not bring her medications nor a list. Each medication was verbally reviewed with the patient and she was encouraged to bring the bottles to every visit to confirm accuracy of list.  Return here in 4 months or sooner for any questions/problems before then.

## 2017-03-19 ENCOUNTER — Telehealth: Payer: Self-pay

## 2017-03-19 NOTE — Telephone Encounter (Signed)
Spoke with patient regarding her diuretic usage last night. She says that she feels better this morning although she's tired because she didn't take the diuretic until late last night so she was up most of the night urinating.   She was concerned about her potassium level if she takes the furosemide. Advised her that her last potassium level on 03/03/17 was 4.5 and if she takes the furosemide infrequently then her potassium level should be ok.   If she finds that she needs to take it 2-3 times or more/week, then she would need lab work drawn to monitor whether she needs potassium supplements or not. Also reminded her that she could take just 1/2 tablet of furosemide instead of the entire tablet. Patient verbalized understanding and was appreciative of the call back.

## 2017-03-19 NOTE — Telephone Encounter (Signed)
Ms Colleen Carson contacted the office stating she felt like she was in the "yellow Zone" last night. She states that she had noticed some swelling in her hands and her feet, she denies any shortness of breath or chest pain. She states that she took a diuretic.  This morning she weighted and she had dropped 3 lbs overnight, as she weighed 138.2 lbs yesterday and is now 135.8 lbs. Her vitals are stable with a BP of 142/76 and HR of 64 bpm.   She is concerned that dropping weight so quickly could cause a depletion in her Potassium. I will have the provider contact her to provide education.

## 2017-04-29 ENCOUNTER — Telehealth: Payer: Self-pay

## 2017-04-29 NOTE — Telephone Encounter (Signed)
Pt daughter contacted the office stating that [name] has been experiencing high blood pressure today. Her last reading was 161/99. She would like to receive a call back in regards to her mothers blood pressure.

## 2017-04-29 NOTE — Telephone Encounter (Signed)
Spoke with patient's daughter, Colleen Carson, about patient's blood pressure. Colleen Carson said that this morning, patient's blood pressure was 161/99 and she took Colleen metoprolol around 8:30am. When Colleen BP was rechecked by a home health nurse and it was still that high, Colleen Carson gave patient another 25mg  metoprolol tartrate ~ 4 hours later. Colleen BP now is 140/60.   Colleen Carson says that Colleen Carson is feeling better now and has been resting most of the morning. Does admit that Colleen Carson is worried quite a bit about Colleen dad and she knows that plays into Colleen elevated BP. No change in weight and says that patient's weight has been 134-135 pounds and that she hasn't had to take any furosemide recently.   Mardene Sayerdvised Colleen Carson to continue to monitor Colleen BP and should it again, go up, to either call us or Colleen PCP regarding this. Colleen Carson was appreciative of the call back.

## 2017-07-13 ENCOUNTER — Ambulatory Visit: Payer: Medicare Other | Attending: Family | Admitting: Family

## 2017-07-13 ENCOUNTER — Encounter: Payer: Self-pay | Admitting: Family

## 2017-07-13 VITALS — BP 142/80 | HR 74 | Resp 20 | Ht 60.0 in | Wt 136.5 lb

## 2017-07-13 DIAGNOSIS — Z8673 Personal history of transient ischemic attack (TIA), and cerebral infarction without residual deficits: Secondary | ICD-10-CM | POA: Diagnosis not present

## 2017-07-13 DIAGNOSIS — I509 Heart failure, unspecified: Secondary | ICD-10-CM | POA: Diagnosis present

## 2017-07-13 DIAGNOSIS — Z79899 Other long term (current) drug therapy: Secondary | ICD-10-CM | POA: Diagnosis not present

## 2017-07-13 DIAGNOSIS — I890 Lymphedema, not elsewhere classified: Secondary | ICD-10-CM | POA: Insufficient documentation

## 2017-07-13 DIAGNOSIS — E875 Hyperkalemia: Secondary | ICD-10-CM | POA: Diagnosis not present

## 2017-07-13 DIAGNOSIS — I10 Essential (primary) hypertension: Secondary | ICD-10-CM

## 2017-07-13 DIAGNOSIS — Z950 Presence of cardiac pacemaker: Secondary | ICD-10-CM | POA: Insufficient documentation

## 2017-07-13 DIAGNOSIS — I4891 Unspecified atrial fibrillation: Secondary | ICD-10-CM | POA: Diagnosis not present

## 2017-07-13 DIAGNOSIS — F419 Anxiety disorder, unspecified: Secondary | ICD-10-CM

## 2017-07-13 DIAGNOSIS — M1990 Unspecified osteoarthritis, unspecified site: Secondary | ICD-10-CM | POA: Diagnosis not present

## 2017-07-13 DIAGNOSIS — E7800 Pure hypercholesterolemia, unspecified: Secondary | ICD-10-CM | POA: Diagnosis not present

## 2017-07-13 DIAGNOSIS — I5022 Chronic systolic (congestive) heart failure: Secondary | ICD-10-CM

## 2017-07-13 DIAGNOSIS — I110 Hypertensive heart disease with heart failure: Secondary | ICD-10-CM | POA: Diagnosis not present

## 2017-07-13 NOTE — Progress Notes (Signed)
Patient ID: Colleen Carson, female    DOB: Apr 12, 1925, 83 y.o.   MRN: [id number]  HPI   Colleen Carson is a 83 y/o female with a history of arthritis, atrial fibrillation, hyperlipidemia, HTN, lymphedema, shingles, CVA and chronic heart failure.  Reviewed echo report done 02/28/17 which showed an EF of 40-45% along with moderate AS, mild MR and mild/mod TR. PA pressure at 31 mm Hg.   Was in the ED 03/03/17 due to the sensation that she can't take a deep breath. Hasn't been eating/drinking much and has been under some stress. Evaluated and discharged home. Admitted 02/27/17 with HF exacerbation. Cardiology consult obtained. Discharged home after 2 days. Admitted 12/24/16 with TIA with slurred speech. Head CT and EEG were negative. Discharged the following day. Was in the ED 10/04/16 due to generalized weakness. Evaluated and discharged home. Admitted 10/01/16 due to a TIA. Neurology consult obtained. Discharged home with PT the next day.   She presents today for her follow-up visit with a chief complaint of anxiety. She describes this as chronic in nature having been present for years with varying levels of severity. She recently had to place her husband in a nursing home 3 weeks ago which she describes as quite stressful and she's having difficulty, at times, adapting to him being there. She denies any chest pain, depression, difficulty sleeping or suicidal thoughts.   Past Medical History:  Diagnosis Date  . Arthritis   . Atrial fibrillation (HCC)   . CHF (congestive heart failure) (HCC)   . Dysrhythmia    Afib  . Hypercholesteremia   . Hypertension   . Lymphedema    lower extremities  . S/P total hip arthroplasty 01/25/2015  . Shingles   . Stroke [facility])    noted ischemia on CT scan   Past Surgical History:  Procedure Laterality Date  . JOINT REPLACEMENT  01/23/15   Left THR Dr. Rosita Kea   . PACEMAKER INSERTION  2017  . TOTAL HIP ARTHROPLASTY Left 01/23/2015   Procedure: TOTAL HIP ARTHROPLASTY ANTERIOR  APPROACH;  Surgeon: Kennedy Bucker, MD;  Location: [facility] ORS;  Service: Orthopedics;  Laterality: Left;   Family History  Problem Relation Age of Onset  . Hypertension Other    Social History  Substance Use Topics  . Smoking status: Never Smoker  . Smokeless tobacco: Never Used  . Alcohol use No   Allergies  Allergen Reactions  . Morphine Nausea Only    vomiting  . Morphine And Related Nausea And Vomiting   Prior to Admission medications   Medication Sig Start Date End Date Taking? Authorizing Provider  apixaban (ELIQUIS) 5 MG TABS tablet Take 5 mg by mouth 2 (two) times daily.   Yes [provider]  atorvastatin (LIPITOR) 40 MG tablet Take 1 tablet (40 mg total) by mouth daily. Patient taking differently: Take 40 mg by mouth at bedtime.  12/25/16  Yes Wieting, Richard, MD  citalopram (CELEXA) 10 MG tablet Take 10 mg by mouth daily. 06/19/17  Yes [provider]  furosemide (LASIX) 20 MG tablet Take 20 mg by mouth as needed.    Yes [provider]  metoprolol tartrate (LOPRESSOR) 25 MG tablet Take 1 tablet (25 mg total) by mouth 2 (two) times daily. 10/04/16 10/04/17 Yes Sharman Cheek, MD    Review of Systems  Constitutional: Negative for appetite change and fatigue.  HENT: Negative for congestion, postnasal drip and sore throat.   Eyes: Negative.   Respiratory: Negative for cough, chest  tightness and shortness of breath.   Cardiovascular: Negative for chest pain, palpitations and leg swelling.  Gastrointestinal: Negative for abdominal distention and abdominal pain.  Endocrine: Negative.   Genitourinary: Negative.   Musculoskeletal: Negative for back pain and neck pain.  Skin: Negative.   Allergic/Immunologic: Negative.   Neurological: Negative for weakness and light-headedness.  Hematological: Negative for adenopathy. Does not bruise/bleed easily.  Psychiatric/Behavioral: Negative for dysphoric mood and sleep disturbance (sleeping on 1 pillow). The  patient is nervous/anxious (husband recently in nursing home for the last 3 weeks).    Vitals:   07/13/17 1300  BP: (!) 176/92  Pulse: 74  Resp: 20  SpO2: 100%  Weight: 136 lb 8 oz (61.9 kg)  Height: 5' (1.524 m)   Wt Readings from Last 3 Encounters:  07/13/17 136 lb 8 oz (61.9 kg)  03/18/17 140 lb 8 oz (63.7 kg)  03/04/17 138 lb 2 oz (62.7 kg)    Lab Results  Component Value Date   CREATININE 1.19 (H) 03/03/2017   CREATININE 0.98 03/01/2017   CREATININE 0.74 02/28/2017   Physical Exam  Constitutional: She is oriented to person, place, and time. She appears well-developed and well-nourished.  HENT:  Head: Normocephalic and atraumatic.  Neck: Normal range of motion. Neck supple. No JVD present.  Cardiovascular: Normal rate and regular rhythm.   Pulmonary/Chest: Effort normal. She has no wheezes. She has no rales.  Abdominal: Soft. She exhibits no distension. There is no tenderness.  Musculoskeletal: She exhibits no edema or tenderness.  Neurological: She is alert and oriented to person, place, and time.  Skin: Skin is warm and dry.  Psychiatric: Her behavior is normal. Thought content normal. Her mood appears anxious.  Nursing note and vitals reviewed.   Assessment & Plan:  1: Chronic heart failure with reduced ejection fraction- - NYHA class I - euvolemic today - weight down by our scale. Advised to call for an overnight weight gain of >2 pounds or a weekly weight gain of >5 pounds - not adding salt to her food and her daughter is reading food labels and cooking foods from scratch. Reviewed the importance of closely following a 2000mg  sodium diet - encouraged her to drink 40-50 ounces of fluid daily - currently without edema; does elevate her legs when sitting for long periods - saw cardiologist Lady Gary(Fath) 05/26/17 - reports receiving her flu vaccine already  2: HTN- - BP elevated initially at 176/92 which improved to 142/80 after checking with a manual cuff - could  always try adding low dose entresto in the future if needed for symptoms - saw PCP Larwance Sachs([name]) 03/12/17  3: Anxiety- - patient says that she's been having some anxiety as he's had to recently place her husband in a nursing home - is on citalopram but says that she'd like to start weaning herself off of it. When asked why, she says that she's concerned about possibly becoming addicted to it. She does say that she feels like the medication is helping though. - discussed that citalopram is non-habit forming and if in the future, she feels like she doesn't need it anymore, she could speak to her PCP about not taking it  Patient did not bring her medications nor a list. Each medication was verbally reviewed with the patient and she was encouraged to bring the bottles to every visit to confirm accuracy of list.  Return here as needed for any questions/problems.

## 2017-07-13 NOTE — Patient Instructions (Signed)
Continue weighing daily and call for an overnight weight gain of > 2 pounds or a weekly weight gain of >5 pounds. 

## 2017-07-14 ENCOUNTER — Ambulatory Visit: Payer: Medicare Other | Admitting: Family

## 2018-01-15 ENCOUNTER — Encounter: Payer: Self-pay | Admitting: Emergency Medicine

## 2018-01-15 ENCOUNTER — Other Ambulatory Visit: Payer: Self-pay

## 2018-01-15 ENCOUNTER — Emergency Department: Payer: Medicare Other

## 2018-01-15 ENCOUNTER — Observation Stay
Admission: EM | Admit: 2018-01-15 | Discharge: 2018-01-16 | Disposition: A | Discharge status: Home / Self Care | Payer: Medicare Other | Attending: Internal Medicine | Admitting: Internal Medicine

## 2018-01-15 DIAGNOSIS — R51 Headache: Secondary | ICD-10-CM | POA: Diagnosis not present

## 2018-01-15 DIAGNOSIS — Z96642 Presence of left artificial hip joint: Secondary | ICD-10-CM | POA: Insufficient documentation

## 2018-01-15 DIAGNOSIS — E7800 Pure hypercholesterolemia, unspecified: Secondary | ICD-10-CM | POA: Insufficient documentation

## 2018-01-15 DIAGNOSIS — I890 Lymphedema, not elsewhere classified: Secondary | ICD-10-CM | POA: Insufficient documentation

## 2018-01-15 DIAGNOSIS — I480 Paroxysmal atrial fibrillation: Secondary | ICD-10-CM | POA: Diagnosis not present

## 2018-01-15 DIAGNOSIS — I5042 Chronic combined systolic (congestive) and diastolic (congestive) heart failure: Secondary | ICD-10-CM | POA: Diagnosis not present

## 2018-01-15 DIAGNOSIS — M1990 Unspecified osteoarthritis, unspecified site: Secondary | ICD-10-CM | POA: Diagnosis not present

## 2018-01-15 DIAGNOSIS — I110 Hypertensive heart disease with heart failure: Secondary | ICD-10-CM | POA: Diagnosis not present

## 2018-01-15 DIAGNOSIS — Z7901 Long term (current) use of anticoagulants: Secondary | ICD-10-CM | POA: Diagnosis not present

## 2018-01-15 DIAGNOSIS — I6523 Occlusion and stenosis of bilateral carotid arteries: Secondary | ICD-10-CM | POA: Insufficient documentation

## 2018-01-15 DIAGNOSIS — E785 Hyperlipidemia, unspecified: Secondary | ICD-10-CM | POA: Diagnosis not present

## 2018-01-15 DIAGNOSIS — R55 Syncope and collapse: Secondary | ICD-10-CM

## 2018-01-15 DIAGNOSIS — Z79899 Other long term (current) drug therapy: Secondary | ICD-10-CM | POA: Insufficient documentation

## 2018-01-15 DIAGNOSIS — I083 Combined rheumatic disorders of mitral, aortic and tricuspid valves: Secondary | ICD-10-CM | POA: Diagnosis not present

## 2018-01-15 DIAGNOSIS — G459 Transient cerebral ischemic attack, unspecified: Secondary | ICD-10-CM | POA: Diagnosis not present

## 2018-01-15 DIAGNOSIS — G3189 Other specified degenerative diseases of nervous system: Secondary | ICD-10-CM | POA: Insufficient documentation

## 2018-01-15 DIAGNOSIS — R4701 Aphasia: Secondary | ICD-10-CM | POA: Insufficient documentation

## 2018-01-15 DIAGNOSIS — Z8673 Personal history of transient ischemic attack (TIA), and cerebral infarction without residual deficits: Secondary | ICD-10-CM | POA: Insufficient documentation

## 2018-01-15 DIAGNOSIS — Z950 Presence of cardiac pacemaker: Secondary | ICD-10-CM | POA: Diagnosis not present

## 2018-01-15 DIAGNOSIS — Z8619 Personal history of other infectious and parasitic diseases: Secondary | ICD-10-CM | POA: Insufficient documentation

## 2018-01-15 DIAGNOSIS — I6782 Cerebral ischemia: Secondary | ICD-10-CM | POA: Insufficient documentation

## 2018-01-15 DIAGNOSIS — Z66 Do not resuscitate: Secondary | ICD-10-CM | POA: Insufficient documentation

## 2018-01-15 DIAGNOSIS — Z885 Allergy status to narcotic agent status: Secondary | ICD-10-CM | POA: Diagnosis not present

## 2018-01-15 DIAGNOSIS — I482 Chronic atrial fibrillation: Secondary | ICD-10-CM | POA: Insufficient documentation

## 2018-01-15 DIAGNOSIS — I639 Cerebral infarction, unspecified: Secondary | ICD-10-CM

## 2018-01-15 LAB — DIFFERENTIAL
BASOS ABS: 0 10*3/uL (ref 0–0.1)
Basophils Relative: 1 %
Eosinophils Absolute: 0.1 10*3/uL (ref 0–0.7)
Eosinophils Relative: 2 %
LYMPHS ABS: 2.7 10*3/uL (ref 1.0–3.6)
LYMPHS PCT: 50 %
Monocytes Absolute: 0.5 10*3/uL (ref 0.2–0.9)
Monocytes Relative: 10 %
NEUTROS ABS: 2 10*3/uL (ref 1.4–6.5)
NEUTROS PCT: 37 %

## 2018-01-15 LAB — CBC
HCT: 43.1 % (ref 35.0–47.0)
HEMOGLOBIN: 14.3 g/dL (ref 12.0–16.0)
MCH: 30.3 pg (ref 26.0–34.0)
MCHC: 33.2 g/dL (ref 32.0–36.0)
MCV: 91.1 fL (ref 80.0–100.0)
Platelets: 142 10*3/uL — ABNORMAL LOW (ref 150–440)
RBC: 4.73 MIL/uL (ref 3.80–5.20)
RDW: 15.3 % — ABNORMAL HIGH (ref 11.5–14.5)
WBC: 5.4 10*3/uL (ref 3.6–11.0)

## 2018-01-15 LAB — GLUCOSE, CAPILLARY: Glucose-Capillary: 115 mg/dL — ABNORMAL HIGH (ref 65–99)

## 2018-01-15 LAB — COMPREHENSIVE METABOLIC PANEL
ALBUMIN: 4.4 g/dL (ref 3.5–5.0)
ALK PHOS: 401 U/L — AB (ref 38–126)
ALT: 34 U/L (ref 14–54)
AST: 47 U/L — ABNORMAL HIGH (ref 15–41)
Anion gap: 7 (ref 5–15)
BILIRUBIN TOTAL: 0.9 mg/dL (ref 0.3–1.2)
BUN: 19 mg/dL (ref 6–20)
CALCIUM: 9.2 mg/dL (ref 8.9–10.3)
CO2: 30 mmol/L (ref 22–32)
Chloride: 101 mmol/L (ref 101–111)
Creatinine, Ser: 1.06 mg/dL — ABNORMAL HIGH (ref 0.44–1.00)
GFR calc Af Amer: 52 mL/min — ABNORMAL LOW (ref >60)
GFR calc non Af Amer: 45 mL/min — ABNORMAL LOW (ref >60)
GLUCOSE: 141 mg/dL — AB (ref 65–99)
POTASSIUM: 4.1 mmol/L (ref 3.5–5.1)
Sodium: 138 mmol/L (ref 135–145)
TOTAL PROTEIN: 7.1 g/dL (ref 6.5–8.1)

## 2018-01-15 LAB — URINALYSIS, COMPLETE (UACMP) WITH MICROSCOPIC
Bilirubin Urine: NEGATIVE
GLUCOSE, UA: NEGATIVE mg/dL
KETONES UR: NEGATIVE mg/dL
Leukocytes, UA: NEGATIVE
Nitrite: NEGATIVE
Protein, ur: NEGATIVE mg/dL
Specific Gravity, Urine: 1.004 — ABNORMAL LOW (ref 1.005–1.030)
pH: 6 (ref 5.0–8.0)

## 2018-01-15 LAB — PROTIME-INR
INR: 1.02
Prothrombin Time: 13.3 seconds (ref 11.4–15.2)

## 2018-01-15 LAB — APTT: APTT: 38 s — AB (ref 24–36)

## 2018-01-15 LAB — TROPONIN I: Troponin I: <0.03 ng/mL (ref <0.03)

## 2018-01-15 MED ORDER — ACETAMINOPHEN 650 MG RE SUPP: 650.0000 mg | RECTAL | Status: DC | PRN

## 2018-01-15 MED ORDER — APIXABAN 5 MG PO TABS
5.0000 mg | ORAL_TABLET | Freq: Two times a day (BID) | ORAL | Status: DC
  Administered 2018-01-15 – 2018-01-16 (×2): 5 mg via ORAL
  Filled 2018-01-15 (×2): qty 1

## 2018-01-15 MED ORDER — SODIUM CHLORIDE 0.9 % IV BOLUS
500.0000 mL | Freq: Once | INTRAVENOUS | Status: AC
  Administered 2018-01-15: 500 mL via INTRAVENOUS

## 2018-01-15 MED ORDER — ATORVASTATIN CALCIUM 20 MG PO TABS
40.0000 mg | ORAL_TABLET | Freq: Every day | ORAL | Status: DC
  Administered 2018-01-15 – 2018-01-16 (×2): 40 mg via ORAL
  Filled 2018-01-15 (×2): qty 2

## 2018-01-15 MED ORDER — ACETAMINOPHEN 160 MG/5ML PO SOLN
650.0000 mg | ORAL | Status: DC | PRN
  Filled 2018-01-15: qty 20.3

## 2018-01-15 MED ORDER — HYDRALAZINE HCL 20 MG/ML IJ SOLN
10.0000 mg | Freq: Four times a day (QID) | INTRAMUSCULAR | Status: DC | PRN
  Administered 2018-01-15: 20:00:00 10 mg via INTRAVENOUS
  Filled 2018-01-15: qty 1

## 2018-01-15 MED ORDER — METOPROLOL TARTRATE 25 MG PO TABS
25.0000 mg | ORAL_TABLET | Freq: Two times a day (BID) | ORAL | Status: DC
  Administered 2018-01-15 – 2018-01-16 (×2): 25 mg via ORAL
  Filled 2018-01-15 (×2): qty 1

## 2018-01-15 MED ORDER — ACETAMINOPHEN 325 MG PO TABS
650.0000 mg | ORAL_TABLET | ORAL | Status: DC | PRN
  Administered 2018-01-16: 650 mg via ORAL
  Filled 2018-01-15: qty 2

## 2018-01-15 MED ORDER — ASPIRIN 325 MG PO TABS
325.0000 mg | ORAL_TABLET | Freq: Every day | ORAL | Status: DC
  Administered 2018-01-15 – 2018-01-16 (×2): 325 mg via ORAL
  Filled 2018-01-15 (×2): qty 1

## 2018-01-15 MED ORDER — STROKE: EARLY STAGES OF RECOVERY BOOK
Freq: Once | Status: AC
  Administered 2018-01-15: 19:00:00

## 2018-01-15 MED ORDER — ASPIRIN 300 MG RE SUPP: 300.0000 mg | Freq: Every day | RECTAL | Status: DC

## 2018-01-15 NOTE — ED Provider Notes (Signed)
[facility] Emergency Department Provider Note    First MD Initiated Contact with Patient 01/15/18 1321     (approximate)  I have reviewed the triage vital signs and the nursing notes.   HISTORY  Chief Complaint Near Syncope    HPI Colleen Carson is a 84 y.o. female history of congestive heart failure as well as A. fib on Eliquis presents to the ER with chief complaint of word finding difficulty generalized weakness and feeling like she could not remember people's names or the names of things.  This started around 12:00 after the patient had been cleaning this morning.  Did have breakfast and lunch without any issues.  Was with her daughter says that she was feeling fine and said that she needed to go lie down.  She was able to form those words and was able to walk to lay down on the couch.  Symptoms persisted so she called her daughter and they were brought to the ER for further evaluation.  She denies any nausea or vomiting.  States that the symptoms have gotten better and that they lasted for a total of up to 1 hour.  Is never had symptoms like this before.  Denies any headache.  No chest pain or shortness of breath.  Does feel fatigued and generalized weak.  Past Medical History:  Diagnosis Date  . Arthritis   . Atrial fibrillation (HCC)   . CHF (congestive heart failure) (HCC)   . Dysrhythmia    Afib  . Hypercholesteremia   . Hypertension   . Lymphedema    lower extremities  . S/P total hip arthroplasty 01/25/2015  . Shingles   . Stroke [facility])    noted ischemia on CT scan   Family History  Problem Relation Age of Onset  . Hypertension Other    Past Surgical History:  Procedure Laterality Date  . JOINT REPLACEMENT  01/23/15   Left THR Dr. Rosita Kea   . PACEMAKER INSERTION  2017  . TOTAL HIP ARTHROPLASTY Left 01/23/2015   Procedure: TOTAL HIP ARTHROPLASTY ANTERIOR APPROACH;  Surgeon: Kennedy Bucker, MD;  Location: [facility] ORS;  Service: Orthopedics;   Laterality: Left;   Patient Active Problem List   Diagnosis Date Noted  . Chronic systolic heart failure (HCC) 03/04/2017  . Anxiety 03/04/2017  . TIA (transient ischemic attack) 12/24/2016  . Acute encephalopathy 10/01/2016  . Symptomatic bradycardia 05/15/2016  . HTN (hypertension) 05/15/2016  . HLD (hyperlipidemia) 05/15/2016  . Atrial fibrillation (HCC) 03/11/2016  . AKI (acute kidney injury) (HCC) 05/06/2015  . S/P total hip arthroplasty 01/25/2015  . Primary osteoarthritis of left hip 01/23/2015      Prior to Admission medications   Medication Sig Start Date End Date Taking? Authorizing Provider  apixaban (ELIQUIS) 5 MG TABS tablet Take 5 mg by mouth 2 (two) times daily.   Yes [provider]  atorvastatin (LIPITOR) 40 MG tablet Take 1 tablet (40 mg total) by mouth daily. Patient taking differently: Take 40 mg by mouth at bedtime.  12/25/16  Yes Wieting, Richard, MD  furosemide (LASIX) 20 MG tablet Take 20 mg by mouth as needed for fluid or edema.    Yes [provider]  metoprolol tartrate (LOPRESSOR) 25 MG tablet Take 1 tablet (25 mg total) by mouth 2 (two) times daily. 10/04/16 01/15/18 Yes Sharman Cheek, MD    Allergies Morphine and Morphine and related    Social History Social History   Tobacco Use  . Smoking status:  Never Smoker  . Smokeless tobacco: Never Used  Substance Use Topics  . Alcohol use: No  . Drug use: No    Review of Systems Patient denies headaches, rhinorrhea, blurry vision, numbness, shortness of breath, chest pain, edema, cough, abdominal pain, nausea, vomiting, diarrhea, dysuria, fevers, rashes or hallucinations unless otherwise stated above in HPI. ____________________________________________   PHYSICAL EXAM:  VITAL SIGNS: Vitals:   01/15/18 1354 01/15/18 1408  BP:    Pulse: 65   Resp: 17   Temp:  97.9 F (36.6 C)  SpO2: 100%     Constitutional: Alert and oriented. Well appearing and in no acute  distress. Eyes: Conjunctivae are normal.  Head: Atraumatic. Nose: No congestion/rhinnorhea. Mouth/Throat: Mucous membranes are moist.   Neck: No stridor. Painless ROM.  Cardiovascular: Normal rate, regular rhythm. Grossly normal heart sounds.  Good peripheral circulation. Respiratory: Normal respiratory effort.  No retractions. Lungs CTAB. Gastrointestinal: Soft and nontender. No distention. No abdominal bruits. No CVA tenderness. Genitourinary:  Musculoskeletal: No lower extremity tenderness nor edema.  No joint effusions. Neurologic:  CN- intact.  No facial droop, Normal FNF.  Normal heel to shin.  Sensation intact bilaterally. Normal speech and language.  Skin:  Skin is warm, dry and intact. No rash noted. Psychiatric: Mood and affect are normal. Speech and behavior are normal.  ____________________________________________   LABS (all labs ordered are listed, but only abnormal results are displayed)  Results for orders placed or performed during the hospital encounter of 01/15/18 (from the past 24 hour(s))  Glucose, capillary     Status: Abnormal   Collection Time: 01/15/18 12:57 PM  Result Value Ref Range   Glucose-Capillary 115 (H) 65 - 99 mg/dL   Comment 1 Notify RN    Comment 2 Document in Chart   Protime-INR     Status: None   Collection Time: 01/15/18  1:07 PM  Result Value Ref Range   Prothrombin Time 13.3 11.4 - 15.2 seconds   INR 1.02   APTT     Status: Abnormal   Collection Time: 01/15/18  1:07 PM  Result Value Ref Range   aPTT 38 (H) 24 - 36 seconds  CBC     Status: Abnormal   Collection Time: 01/15/18  1:07 PM  Result Value Ref Range   WBC 5.4 3.6 - 11.0 K/uL   RBC 4.73 3.80 - 5.20 MIL/uL   Hemoglobin 14.3 12.0 - 16.0 g/dL   HCT 69.6 29.5 - 28.4 %   MCV 91.1 80.0 - 100.0 fL   MCH 30.3 26.0 - 34.0 pg   MCHC 33.2 32.0 - 36.0 g/dL   RDW 13.2 (H) 44.0 - 10.2 %   Platelets 142 (L) 150 - 440 K/uL  Differential     Status: None   Collection Time: 01/15/18   1:07 PM  Result Value Ref Range   Neutrophils Relative % 37 %   Neutro Abs 2.0 1.4 - 6.5 K/uL   Lymphocytes Relative 50 %   Lymphs Abs 2.7 1.0 - 3.6 K/uL   Monocytes Relative 10 %   Monocytes Absolute 0.5 0.2 - 0.9 K/uL   Eosinophils Relative 2 %   Eosinophils Absolute 0.1 0 - 0.7 K/uL   Basophils Relative 1 %   Basophils Absolute 0.0 0 - 0.1 K/uL  Comprehensive metabolic panel     Status: Abnormal   Collection Time: 01/15/18  1:07 PM  Result Value Ref Range   Sodium 138 135 - 145 mmol/L   Potassium 4.1 3.5 -  5.1 mmol/L   Chloride 101 101 - 111 mmol/L   CO2 30 22 - 32 mmol/L   Glucose, Bld 141 (H) 65 - 99 mg/dL   BUN 19 6 - 20 mg/dL   Creatinine, Ser 1.61 (H) 0.44 - 1.00 mg/dL   Calcium 9.2 8.9 - 09.6 mg/dL   Total Protein 7.1 6.5 - 8.1 g/dL   Albumin 4.4 3.5 - 5.0 g/dL   AST 47 (H) 15 - 41 U/L   ALT 34 14 - 54 U/L   Alkaline Phosphatase 401 (H) 38 - 126 U/L   Total Bilirubin 0.9 0.3 - 1.2 mg/dL   GFR calc non Af Amer 45 (L) >60 mL/min   GFR calc Af Amer 52 (L) >60 mL/min   Anion gap 7 5 - 15  Troponin I     Status: None   Collection Time: 01/15/18  1:07 PM  Result Value Ref Range   Troponin I <0.03 <0.03 ng/mL  Urinalysis, Complete w Microscopic     Status: Abnormal   Collection Time: 01/15/18  1:55 PM  Result Value Ref Range   Color, Urine STRAW (A) YELLOW   APPearance CLEAR (A) CLEAR   Specific Gravity, Urine 1.004 (L) 1.005 - 1.030   pH 6.0 5.0 - 8.0   Glucose, UA NEGATIVE NEGATIVE mg/dL   Hgb urine dipstick SMALL (A) NEGATIVE   Bilirubin Urine NEGATIVE NEGATIVE   Ketones, ur NEGATIVE NEGATIVE mg/dL   Protein, ur NEGATIVE NEGATIVE mg/dL   Nitrite NEGATIVE NEGATIVE   Leukocytes, UA NEGATIVE NEGATIVE   Squamous Epithelial / LPF 0-5 0 - 5   WBC, UA 0-5 0 - 5 WBC/hpf   RBC / HPF 0-5 0 - 5 RBC/hpf   Bacteria, UA FEW (A) NONE SEEN   ____________________________________________  EKG My review and personal interpretation at Time: 12:49   Indication: weakness   Rate: 70  Rhythm: av paced Axis: left Other: paced rhythm ____________________________________________  RADIOLOGY  I personally reviewed all radiographic images ordered to evaluate for the above acute complaints and reviewed radiology reports and findings.  These findings were personally discussed with the patient.  Please see medical record for radiology report.  ____________________________________________   PROCEDURES  Procedure(s) performed:  Procedures    Critical Care performed: no ____________________________________________   INITIAL IMPRESSION / ASSESSMENT AND PLAN / ED COURSE  Pertinent labs & imaging results that were available during my care of the patient were reviewed by me and considered in my medical decision making (see chart for details).  DDX: cva, tia, hypoglycemia, dehydration, electrolyte abnormality, dissection, sepsis   Colleen Carson is a 84 y.o. who presents to the ED with symptoms as described above.  Does not meet code stroke criteria as the patient's symptoms seem to significantly improved.  Very well may be metabolic as she has no focal deficits and seems to have more of a global weakness but due to the report of some component of expressive a aphasia lasting 30 minutes to an hour given her chronic medical conditions it is concerning for TIA CVA.  CT head shows no evidence of hemorrhage or mass.  She does have evidence of chronic microvascular disease.  Blood pressure is stable and she is otherwise afebrile.    Patient's symptoms do seem mild at this point and is significantly improved the pain is more consistent with TIA.  She has pacemaker cannot MRI to exclude CVA.  Possible seizure-like episode or other metabolic process.  Patient reports compliance with her  medications.  No evidence of septic process.  At this point do believe patient will require hospitalization for serial neuro checks and further medical evaluation she is otherwise high risk  though I therapeutics options limited at this point as she is otherwise medically optimized on aspirin as well as Eliquis.  Have discussed with the patient and available family all diagnostics and treatments performed thus far and all questions were answered to the best of my ability. The patient demonstrates understanding and agreement with plan.   As part of my medical decision making, I reviewed the following data within the electronic MEDICAL RECORD NUMBER Nursing notes reviewed and incorporated, Labs reviewed, notes from prior ED visits.  ____________________________________________   FINAL CLINICAL IMPRESSION(S) / ED DIAGNOSES  Final diagnoses:  Near syncope  Aphasia      NEW MEDICATIONS STARTED DURING THIS VISIT:  New Prescriptions   No medications on file     Note:  This document was prepared using Dragon voice recognition software and may include unintentional dictation errors.    Willy Eddyobinson, Aboubacar Matsuo, MD 01/15/18 1459

## 2018-01-15 NOTE — Progress Notes (Signed)
Charge RN Annice PihJackie made aware about pt's condition and the daughter's request of having the MD see the pt. Dr. Auburn BilberryShreyang Patel at bedside now.

## 2018-01-15 NOTE — ED Triage Notes (Addendum)
Pt comes into the ED via POV c/o near syncopal episode and feeling overall weak.  Patient is currently leaning on her daughter in the wheelchair and allowing her daughter to answer questions.  Patient is alert and oriented at this time.  Patient states she has shortness of breath but denies any chest pain, N/V, or dizziness.  Patient states that she does have a pacemaker placed.  Patient in NAD at this time with even and unlabored respirations and neurologically intact.  Per patient's daughter, she was struggling to find her words and the patient still feels that way, but there is no problem noted this this triage RN at this time.  Patient has generalized weakness, but its not worse on one side vs the other.

## 2018-01-15 NOTE — Progress Notes (Signed)
Daughter at bedside becoming more worried and upset about the pt "getting weaker and not her normal self". BP rechecked, 134/51. Pt claiming "I dont feel good, I can feel my heart racing". Pt's daughter requested for MD to check on pt. Paged Dr. Allena KatzPatel and made aware of the above. No orders made. Will continue to monitor.

## 2018-01-15 NOTE — ED Notes (Signed)
Code stroke called per Penni BombardKendall

## 2018-01-15 NOTE — Progress Notes (Addendum)
During shift change, pt c/o of getting "weaker and shaky", no appetite and "not feeling good". Vital signs taken by previous shift RN, BP=208/101, 214/98 when rechecked. Pt last NIH score=0. Dr Allena KatzPatel paged and ordered for PRN Hydralazine 10mg  IV for SBP >180. PRN medicine administered, will continue to monitor closely.

## 2018-01-15 NOTE — Progress Notes (Signed)
CODE STROKE- PHARMACY COMMUNICATION  Patient on apixaban PTA  Time CODE STROKE called/page received:  Time response to CODE STROKE was made (in person or via phone):   Time Stroke Kit retrieved from [employer] (only if needed):  Name of Provider/Nurse contacted:  Past Medical History:  Diagnosis Date  . Arthritis   . Atrial fibrillation (HCC)   . CHF (congestive heart failure) (HCC)   . Dysrhythmia    Afib  . Hypercholesteremia   . Hypertension   . Lymphedema    lower extremities  . S/P total hip arthroplasty 01/25/2015  . Shingles   . Stroke (HCC)    noted ischemia on CT scan   Prior to Admission medications   Medication Sig Start Date End Date Taking? Authorizing Provider  apixaban (ELIQUIS) 5 MG TABS tablet Take 5 mg by mouth 2 (two) times daily.   Yes [provider]  atorvastatin (LIPITOR) 40 MG tablet Take 1 tablet (40 mg total) by mouth daily. Patient taking differently: Take 40 mg by mouth at bedtime.  12/25/16  Yes Wieting, Richard, MD  furosemide (LASIX) 20 MG tablet Take 20 mg by mouth as needed for fluid or edema.    Yes [provider]  metoprolol tartrate (LOPRESSOR) 25 MG tablet Take 1 tablet (25 mg total) by mouth 2 (two) times daily. 10/04/16 01/15/18 Yes Stafford, Phillip, MD  citalopram (CELEXA) 10 MG tablet Take 10 mg by mouth daily. 06/19/17 01/15/18  [provider]    ,  D ,PharmD Clinical Pharmacist  01/15/2018  3:51 PM   

## 2018-01-15 NOTE — Plan of Care (Signed)
Pt admitted from the ED. Upon admission VSS, NIH 0, pt at baseline per pt. During shift change report pt c/o generalized weakness. No neurological changes noted. BP 214/98, rest of VSS. Notified Dr Allena KatzPatel of the above, hydralazine order. Night shift nurse to f/u.

## 2018-01-15 NOTE — Progress Notes (Addendum)
Advanced care plan.  Purpose of the Encounter: CODE STATUS Parties in Attendance:Patient Patient's Decision Capacity:Good Subjective/Patient's story: Presented with about to pass out. She is also had some trouble finding words Objective/Medical story Admitted for near-syncope, questionable CVA CT head in the emergency room no acute abnormality Goals of care determination:  Advanced directives discussed with the patient in detail Patient does not want any cardiac resuscitation, intubation and ventilator Goals met CODE STATUS: DNR Time spent discussing advanced care planning: 16 minutes          

## 2018-01-15 NOTE — Progress Notes (Signed)
[facility] - [place] at [facility]                                                                                                                                                                                  Patient Demographics   Colleen MoundHilda Carson, is a 84 y.o. female, DOB - 07-15-1925, OZH:[id number]  Admit date - 01/15/2018   Admitting Physician Ihor AustinPavan Pyreddy, MD  Outpatient Primary MD for the patient is Kandyce RudBabaoff, Marcus, MD   LOS - 0  Subjective: Called by nurse due to patient daughter concern her mother is is not doing well and continues to be weak, patient herself stating that she is weak she is having some trouble with her speech    Review of Systems:   CONSTITUTIONAL: Limited due to patient unable to provide  Vitals:   Vitals:   01/15/18 1734 01/15/18 1915 01/15/18 1916 01/15/18 2010  BP: (!) 165/83 (!) 208/101 (!) 214/98 (!) 134/51  Pulse: 60 64  64  Resp:  17    Temp: 97.8 F (36.6 C)     TempSrc: Oral     SpO2: 97% 100%  98%  Weight:      Height:        Wt Readings from Last 3 Encounters:  01/15/18 59.4 kg (131 lb)  07/13/17 61.9 kg (136 lb 8 oz)  03/18/17 63.7 kg (140 lb 8 oz)     Intake/Output Summary (Last 24 hours) at 01/15/2018 2118 Last data filed at 01/15/2018 1539 Gross per 24 hour  Intake 500 ml  Output -  Net 500 ml    Physical Exam:   GENERAL: Pleasant-appearing in no apparent distress.  HEAD, EYES, EARS, NOSE AND THROAT: Atraumatic, normocephalic. Extraocular muscles are intact. Pupils equal and reactive to light. Sclerae anicteric. No conjunctival injection. No oro-pharyngeal erythema.  NECK: Supple. There is no jugular venous distention. No bruits, no lymphadenopathy, no thyromegaly.  HEART: Regular rate and rhythm,. No murmurs, no rubs, no clicks.  LUNGS: Clear to auscultation bilaterally. No rales or rhonchi. No wheezes.  ABDOMEN: Soft, flat, nontender, nondistended. Has good bowel sounds. No hepatosplenomegaly  appreciated.  EXTREMITIES: No evidence of any cyanosis, clubbing, or peripheral edema.  +2 pedal and radial pulses bilaterally.  NEUROLOGIC: Patient able to move all extremities follow commands.  She does have some speech difficulty SKIN: Moist and warm with no rashes appreciated.  Psych: Not anxious, depressed LN: No inguinal LN enlargement    Antibiotics   Anti-infectives (From admission, onward)   None      Medications   Scheduled Meds: . apixaban  5 mg Oral BID  . aspirin  300 mg Rectal Daily  Or  . aspirin  325 mg Oral Daily  . atorvastatin  40 mg Oral Daily  . metoprolol tartrate  25 mg Oral BID   Continuous Infusions: PRN Meds:.acetaminophen **OR** acetaminophen (TYLENOL) oral liquid 160 mg/5 mL **OR** acetaminophen, hydrALAZINE   Data Review:   Micro Results No results found for this or any previous visit (from the past 240 hour(s)).  Radiology Reports Dg Chest 2 View  Result Date: 01/15/2018 CLINICAL DATA:  Near syncopal episode.  Weakness. EXAM: CHEST - 2 VIEW COMPARISON:  03/03/2017. FINDINGS: Cardiac monitoring leads noted over the chest. Density noted over the right upper chest most likely related to overlying leads. Cardiac pacer with lead tips over the right atrium right ventricle. Cardiomegaly with normal pulmonary vascularity. Mild bibasilar atelectasis. No pleural effusion or pneumothorax. IMPRESSION: 1. Cardiac pacer with lead tips over the right atrium right ventricle. Cardiomegaly. Normal pulmonary vascularity. 2.  Mild left base subsegmental atelectasis/scarring. Electronically Signed   By: Maisie Fus  Register   On: 01/15/2018 14:19   Ct Head Code Stroke Wo Contrast  Result Date: 01/15/2018 CLINICAL DATA:  Code stroke. Whole body weakness started 1 hour ago. EXAM: CT HEAD WITHOUT CONTRAST TECHNIQUE: Contiguous axial images were obtained from the base of the skull through the vertex without intravenous contrast. COMPARISON:  10/04/2016. FINDINGS: Brain: No  evidence for acute infarction, hemorrhage, mass lesion, hydrocephalus, or extra-axial fluid. Generalized atrophy. Chronic microvascular ischemic change. Remote BILATERAL occipital infarcts, greater on the LEFT. Chronic LEFT frontal deep white matter infarcts. Vascular: No hyperdense vessel or unexpected calcification. Skull: Calcification of the cavernous internal carotid arteries consistent with cerebrovascular atherosclerotic disease. No signs of intracranial large vessel occlusion. Sinuses/Orbits: No acute finding. Other: None. ASPECTS [facility] Stroke Program Early CT Score) - Ganglionic level infarction (caudate, lentiform nuclei, internal capsule, insula, M1-M3 cortex): 7 - Supraganglionic infarction (M4-M6 cortex): 3 Total score (0-10 with 10 being normal): 10 IMPRESSION: 1. Atrophy and small vessel disease.  Chronic cerebral ischemia. 2. ASPECTS is 10. These results were called by telephone at the time of interpretation on 01/15/2018 at 1:20 pm to Dr. Willy Eddy , who verbally acknowledged these results. Electronically Signed   By: Elsie Stain M.D.   On: 01/15/2018 13:22     CBC Recent Labs  Lab 01/15/18 1307  WBC 5.4  HGB 14.3  HCT 43.1  PLT 142*  MCV 91.1  MCH 30.3  MCHC 33.2  RDW 15.3*  LYMPHSABS 2.7  MONOABS 0.5  EOSABS 0.1  BASOSABS 0.0    Chemistries  Recent Labs  Lab 01/15/18 1307  NA 138  K 4.1  CL 101  CO2 30  GLUCOSE 141*  BUN 19  CREATININE 1.06*  CALCIUM 9.2  AST 47*  ALT 34  ALKPHOS 401*  BILITOT 0.9   ------------------------------------------------------------------------------------------------------------------ estimated creatinine clearance is 28.5 mL/min (A) (by C-G formula based on SCr of 1.06 mg/dL (H)). ------------------------------------------------------------------------------------------------------------------ No results for input(s): HGBA1C in the last 72  hours. ------------------------------------------------------------------------------------------------------------------ No results for input(s): CHOL, HDL, LDLCALC, TRIG, CHOLHDL, LDLDIRECT in the last 72 hours. ------------------------------------------------------------------------------------------------------------------ No results for input(s): TSH, T4TOTAL, T3FREE, THYROIDAB in the last 72 hours.  Invalid input(s): FREET3 ------------------------------------------------------------------------------------------------------------------ No results for input(s): VITAMINB12, FOLATE, FERRITIN, TIBC, IRON, RETICCTPCT in the last 72 hours.  Coagulation profile Recent Labs  Lab 01/15/18 1307  INR 1.02    No results for input(s): DDIMER in the last 72 hours.  Cardiac Enzymes Recent Labs  Lab 01/15/18 1307  TROPONINI <0.03   ------------------------------------------------------------------------------------------------------------------ Invalid input(s):  POCBNP    Assessment & Plan  Patient is 84 year old white female admitted earlier with possible stroke/syncope  1.  Acute CVA I explained to patient's daughter that patient may be having a stroke.  She is on Eliquis therapy and not a candidate for thrombolytics.  At this point we would need to continue to treat her as we are doing currently.  Neurology B will be evaluating her tomorrow.  Patient will need a CT scan if symptoms do not improve in 48 hours. I reassured the daughter that patient may continue to remain on this floor for now.  We will monitor closely and continue all the therapies that are started.     Code Status Orders  (From admission, onward)        Start     Ordered   01/15/18 1809  Do not attempt resuscitation (DNR)  Continuous    Question Answer Comment  In the event of cardiac or respiratory ARREST Do not call a "code blue"   In the event of cardiac or respiratory ARREST Do not perform Intubation, CPR,  defibrillation or ACLS   In the event of cardiac or respiratory ARREST Use medication by any route, position, wound care, and other measures to relive pain and suffering. May use oxygen, suction and manual treatment of airway obstruction as needed for comfort.      01/15/18 1808    Code Status History    Date Active Date Inactive Code Status Order ID Comments User Context   01/15/2018 1725 01/15/2018 1808 Full Code [id number]  Ihor Austin, MD Inpatient   02/28/2017 0022 03/01/2017 1413 Full Code [id number]  Tonye Royalty, DO Inpatient   12/25/2016 1153 12/25/2016 1547 DNR [id number]  Bo Mcclintock, RN Inpatient   12/24/2016 1503 12/25/2016 1153 Full Code [id number]  Adrian Saran, MD Inpatient   12/24/2016 1126 12/24/2016 1503 DNR [id number]  Adrian Saran, MD ED   10/02/2016 0041 10/02/2016 2212 DNR [id number]  Oralia Manis, MD Inpatient   05/15/2016 0219 05/15/2016 1519 DNR [id number]  Oralia Manis, MD Inpatient   03/11/2016 0839 03/13/2016 1609 DNR [id number]  Wyatt Haste, MD ED   03/11/2016 0839 03/11/2016 0839 Full Code [id number]  Hower, Cletis Athens, MD ED   05/06/2015 0301 05/07/2015 1502 DNR [id number]  Arnaldo Natal, MD Inpatient   05/06/2015 0157 05/06/2015 0301 Full Code [id number]  Arnaldo Natal, MD ED   01/23/2015 1121 01/26/2015 1625 Full Code [id number]  Kennedy Bucker, MD Inpatient    Advance Directive Documentation     Most Recent Value  Type of Advance Directive  Healthcare Power of Attorney, Living will  Pre-existing out of facility DNR order (yellow form or pink MOST form)  -  "MOST" Form in Place?  -           Consults neurology  DVT Prophylaxis Eliquis Lab Results  Component Value Date   PLT 142 (L) 01/15/2018     Time Spent in minutes 35 minutes additional time spent in addition to admission greater than 50% of time spent in care coordination and counseling patient regarding the condition and plan of care.   Auburn Bilberry M.D on 01/15/2018 at 9:18 PM  Between 7am to 6pm -  Pager - [phone]  After 6pm go to [email or web address] - Social research officer, government  [employer]   Office  [phone]

## 2018-01-15 NOTE — H&P (Signed)
[facility] - [place] at [facility]   PATIENT NAME: Colleen Carson    MR#:  [id number]  DATE OF BIRTH:  1925/07/10  DATE OF ADMISSION:  01/15/2018  PRIMARY CARE PHYSICIAN: Kandyce Rud, MD   REQUESTING/REFERRING PHYSICIAN:   CHIEF COMPLAINT:   Chief Complaint  Patient presents with  . Near Syncope    HISTORY OF PRESENT ILLNESS: Colleen Carson  is a 84 y.o. female with a known history of atrial fibrillation, congestive heart failure, hypertension, hyperlipidemia, arthritis, lymphedema which is chronic, CVA in the past presented to the emergency room because of trouble finding words and also felt that she was about to pass out.  patient lives at her daughter's home.  She is independent in activities of daily living.  She ambulates independently.  In the emergency room she is awake and alert and responds to all verbal commands.  No trouble with speech.  No tingling or numbness in any part of the body.  Initial CT head no acute abnormality hospitalist service was consulted for further care.  PAST MEDICAL HISTORY:   Past Medical History:  Diagnosis Date  . Arthritis   . Atrial fibrillation (HCC)   . CHF (congestive heart failure) (HCC)   . Dysrhythmia    Afib  . Hypercholesteremia   . Hypertension   . Lymphedema    lower extremities  . S/P total hip arthroplasty 01/25/2015  . Shingles   . Stroke [facility])    noted ischemia on CT scan    PAST SURGICAL HISTORY:  Past Surgical History:  Procedure Laterality Date  . JOINT REPLACEMENT  01/23/15   Left THR Dr. Rosita Kea   . PACEMAKER INSERTION  2017  . TOTAL HIP ARTHROPLASTY Left 01/23/2015   Procedure: TOTAL HIP ARTHROPLASTY ANTERIOR APPROACH;  Surgeon: Kennedy Bucker, MD;  Location: [facility] ORS;  Service: Orthopedics;  Laterality: Left;    SOCIAL HISTORY:  Social History   Tobacco Use  . Smoking status: Never Smoker  . Smokeless tobacco: Never Used  Substance Use Topics  . Alcohol use: No    FAMILY HISTORY:   Family History  Problem Relation Age of Onset  . Hypertension Other     DRUG ALLERGIES:  Allergies  Allergen Reactions  . Morphine Nausea Only    Hallucinations, nausea, vomitting  . Morphine And Related Nausea And Vomiting    REVIEW OF SYSTEMS:   CONSTITUTIONAL: No fever, fatigue or weakness.  EYES: No blurred or double vision.  EARS, NOSE, AND THROAT: No tinnitus or ear pain.  RESPIRATORY: No cough, shortness of breath, wheezing or hemoptysis.  CARDIOVASCULAR: No chest pain, orthopnea, edema.  GASTROINTESTINAL: No nausea, vomiting, diarrhea or abdominal pain.  GENITOURINARY: No dysuria, hematuria.  ENDOCRINE: No polyuria, nocturia,  HEMATOLOGY: No anemia, easy bruising or bleeding SKIN: No rash or lesion. MUSCULOSKELETAL: No joint pain or arthritis.   NEUROLOGIC: No tingling, numbness, weakness.  PSYCHIATRY: No anxiety or depression.   MEDICATIONS AT HOME:  Prior to Admission medications   Medication Sig Start Date End Date Taking? Authorizing Provider  apixaban (ELIQUIS) 5 MG TABS tablet Take 5 mg by mouth 2 (two) times daily.   Yes [provider]  atorvastatin (LIPITOR) 40 MG tablet Take 1 tablet (40 mg total) by mouth daily. Patient taking differently: Take 40 mg by mouth at bedtime.  12/25/16  Yes Wieting, Richard, MD  furosemide (LASIX) 20 MG tablet Take 20 mg by mouth as needed for fluid or edema.    Yes [provider]  metoprolol tartrate (LOPRESSOR) 25 MG tablet Take 1 tablet (25 mg total) by mouth 2 (two) times daily. 10/04/16 01/15/18 Yes Sharman Cheek, MD      PHYSICAL EXAMINATION:   VITAL SIGNS: Blood pressure (!) 150/73, pulse 65, temperature 97.9 F (36.6 C), resp. rate 17, height 5' (1.524 m), weight 59.4 kg (131 lb), SpO2 100 %.  GENERAL:  84 y.o.-year-old patient lying in the bed with no acute distress.  EYES: Pupils equal, round, reactive to light and accommodation. No scleral icterus. Extraocular muscles intact.  HEENT: Head  atraumatic, normocephalic. Oropharynx and nasopharynx clear.  NECK:  Supple, no jugular venous distention. No thyroid enlargement, no tenderness.  LUNGS: Normal breath sounds bilaterally, no wheezing, rales,rhonchi or crepitation. No use of accessory muscles of respiration.  CARDIOVASCULAR: S1, S2 normal. No murmurs, rubs, or gallops.  ABDOMEN: Soft, nontender, nondistended. Bowel sounds present. No organomegaly or mass.  EXTREMITIES: No pedal edema, cyanosis, or clubbing.  NEUROLOGIC: Cranial nerves II through XII are intact. Muscle strength 5/5 in all extremities. Sensation intact. Gait not checked.  PSYCHIATRIC: The patient is alert and oriented x 3.  SKIN: No obvious rash, lesion, or ulcer.   LABORATORY PANEL:   CBC Recent Labs  Lab 01/15/18 1307  WBC 5.4  HGB 14.3  HCT 43.1  PLT 142*  MCV 91.1  MCH 30.3  MCHC 33.2  RDW 15.3*  LYMPHSABS 2.7  MONOABS 0.5  EOSABS 0.1  BASOSABS 0.0   ------------------------------------------------------------------------------------------------------------------  Chemistries  Recent Labs  Lab 01/15/18 1307  NA 138  K 4.1  CL 101  CO2 30  GLUCOSE 141*  BUN 19  CREATININE 1.06*  CALCIUM 9.2  AST 47*  ALT 34  ALKPHOS 401*  BILITOT 0.9   ------------------------------------------------------------------------------------------------------------------ estimated creatinine clearance is 28.5 mL/min (A) (by C-G formula based on SCr of 1.06 mg/dL (H)). ------------------------------------------------------------------------------------------------------------------ No results for input(s): TSH, T4TOTAL, T3FREE, THYROIDAB in the last 72 hours.  Invalid input(s): FREET3   Coagulation profile Recent Labs  Lab 01/15/18 1307  INR 1.02   ------------------------------------------------------------------------------------------------------------------- No results for input(s): DDIMER in the last 72  hours. -------------------------------------------------------------------------------------------------------------------  Cardiac Enzymes Recent Labs  Lab 01/15/18 1307  TROPONINI <0.03   ------------------------------------------------------------------------------------------------------------------ Invalid input(s): POCBNP  ---------------------------------------------------------------------------------------------------------------  Urinalysis    Component Value Date/Time   COLORURINE STRAW (A) 01/15/2018 1355   APPEARANCEUR CLEAR (A) 01/15/2018 1355   APPEARANCEUR Hazy 01/10/2015 1349   LABSPEC 1.004 (L) 01/15/2018 1355   LABSPEC 1.026 01/10/2015 1349   PHURINE 6.0 01/15/2018 1355   GLUCOSEU NEGATIVE 01/15/2018 1355   GLUCOSEU Negative 01/10/2015 1349   HGBUR SMALL (A) 01/15/2018 1355   BILIRUBINUR NEGATIVE 01/15/2018 1355   BILIRUBINUR Negative 01/10/2015 1349   KETONESUR NEGATIVE 01/15/2018 1355   PROTEINUR NEGATIVE 01/15/2018 1355   NITRITE NEGATIVE 01/15/2018 1355   LEUKOCYTESUR NEGATIVE 01/15/2018 1355   LEUKOCYTESUR 1+ 01/10/2015 1349     RADIOLOGY: Dg Chest 2 View  Result Date: 01/15/2018 CLINICAL DATA:  Near syncopal episode.  Weakness. EXAM: CHEST - 2 VIEW COMPARISON:  03/03/2017. FINDINGS: Cardiac monitoring leads noted over the chest. Density noted over the right upper chest most likely related to overlying leads. Cardiac pacer with lead tips over the right atrium right ventricle. Cardiomegaly with normal pulmonary vascularity. Mild bibasilar atelectasis. No pleural effusion or pneumothorax. IMPRESSION: 1. Cardiac pacer with lead tips over the right atrium right ventricle. Cardiomegaly. Normal pulmonary vascularity. 2.  Mild left base subsegmental atelectasis/scarring. Electronically Signed   By: Maisie Fus  Register  On: 01/15/2018 14:19   Ct Head Code Stroke Wo Contrast  Result Date: 01/15/2018 CLINICAL DATA:  Code stroke. Whole body weakness started 1  hour ago. EXAM: CT HEAD WITHOUT CONTRAST TECHNIQUE: Contiguous axial images were obtained from the base of the skull through the vertex without intravenous contrast. COMPARISON:  10/04/2016. FINDINGS: Brain: No evidence for acute infarction, hemorrhage, mass lesion, hydrocephalus, or extra-axial fluid. Generalized atrophy. Chronic microvascular ischemic change. Remote BILATERAL occipital infarcts, greater on the LEFT. Chronic LEFT frontal deep white matter infarcts. Vascular: No hyperdense vessel or unexpected calcification. Skull: Calcification of the cavernous internal carotid arteries consistent with cerebrovascular atherosclerotic disease. No signs of intracranial large vessel occlusion. Sinuses/Orbits: No acute finding. Other: None. ASPECTS [facility] Stroke Program Early CT Score) - Ganglionic level infarction (caudate, lentiform nuclei, internal capsule, insula, M1-M3 cortex): 7 - Supraganglionic infarction (M4-M6 cortex): 3 Total score (0-10 with 10 being normal): 10 IMPRESSION: 1. Atrophy and small vessel disease.  Chronic cerebral ischemia. 2. ASPECTS is 10. These results were called by telephone at the time of interpretation on 01/15/2018 at 1:20 pm to Dr. Willy EddyPATRICK ROBINSON , who verbally acknowledged these results. Electronically Signed   By: Elsie StainJohn T Curnes M.D.   On: 01/15/2018 13:22    EKG: Orders placed or performed during the hospital encounter of 01/15/18  . EKG 12-Lead  . EKG 12-Lead  . ED EKG  . ED EKG    IMPRESSION AND PLAN:  84 year old female patient with history of arthritis, atrial fibrillation, CVA, congestive heart failure, hyperlipidemia, hypertension presented to the emergency room for difficulty finding words and patient felt about to pass out.  1.  Near syncope Probably vasovagal in etiology Admit patient to telemetry observation bed Cycle troponin and monitor for any rhythm changes  2.  Rule out new onset CVA Check carotid ultrasound and echocardiogram Patient has  pacemaker MRI brain cannot be done Neurology consultation Continue anticoagulation with Eliquis  3.  Chronic atrial fibrillation Continue anticoagulation with Eliquis  4.  Abnormal liver function tests Avoid hepatotoxic drugs Repeat LFTs in the morning  5.  DVT prophylaxis On oral Eliquis  All the records are reviewed and case discussed with ED provider. Management plans discussed with the patient, family and they are in agreement.  CODE STATUS:DNR Code Status History    Date Active Date Inactive Code Status Order ID Comments User Context   02/28/2017 0022 03/01/2017 1413 Full Code [id number]  Tonye RoyaltyHugelmeyer, Alexis, DO Inpatient   12/25/2016 1153 12/25/2016 1547 DNR [id number]  Bo McclintockBrewer, Kimberly S, RN Inpatient   12/24/2016 1503 12/25/2016 1153 Full Code [id number]  Adrian SaranMody, Sital, MD Inpatient   12/24/2016 1126 12/24/2016 1503 DNR [id number]  Adrian SaranMody, Sital, MD ED   10/02/2016 0041 10/02/2016 2212 DNR [id number]  Oralia ManisWillis, David, MD Inpatient   05/15/2016 0219 05/15/2016 1519 DNR [id number]  Oralia ManisWillis, David, MD Inpatient   03/11/2016 0839 03/13/2016 1609 DNR [id number]  Wyatt HasteHower, David K, MD ED   03/11/2016 0839 03/11/2016 0839 Full Code [id number]  Hower, Cletis Athensavid K, MD ED   05/06/2015 0301 05/07/2015 1502 DNR [id number]  Arnaldo Nataliamond, Michael S, MD Inpatient   05/06/2015 0157 05/06/2015 0301 Full Code [id number]  Arnaldo Nataliamond, Michael S, MD ED   01/23/2015 1121 01/26/2015 1625 Full Code [id number]  Kennedy BuckerMenz, Michael, MD Inpatient       TOTAL TIME TAKING CARE OF THIS PATIENT: 51 minutes.    Ihor AustinPavan Alp Goldwater M.D on 01/15/2018 at 3:41 PM  Between 7am to 6pm - Pager - [phone]  After 6pm go to  [email or web address] - password [email or web address] [facility]  [place] [facility]  Office  [phone]  CC: Primary care physician; Kandyce Rud, MD

## 2018-01-16 ENCOUNTER — Observation Stay: Payer: Medicare Other

## 2018-01-16 ENCOUNTER — Observation Stay
Admit: 2018-01-16 | Discharge: 2018-01-16 | Disposition: A | Discharge status: Home / Self Care | Payer: Medicare Other | Attending: Internal Medicine | Admitting: Internal Medicine

## 2018-01-16 DIAGNOSIS — G459 Transient cerebral ischemic attack, unspecified: Secondary | ICD-10-CM | POA: Diagnosis not present

## 2018-01-16 DIAGNOSIS — R55 Syncope and collapse: Secondary | ICD-10-CM | POA: Diagnosis not present

## 2018-01-16 LAB — LIPID PANEL
Cholesterol: 154 mg/dL (ref 0–200)
HDL: 78 mg/dL (ref >40)
LDL Cholesterol: 63 mg/dL (ref 0–99)
TRIGLYCERIDES: 63 mg/dL (ref <150)
Total CHOL/HDL Ratio: 2 RATIO
VLDL: 13 mg/dL (ref 0–40)

## 2018-01-16 LAB — HEPATIC FUNCTION PANEL
ALBUMIN: 3.4 g/dL — AB (ref 3.5–5.0)
ALT: 30 U/L (ref 14–54)
AST: 41 U/L (ref 15–41)
Alkaline Phosphatase: 348 U/L — ABNORMAL HIGH (ref 38–126)
Bilirubin, Direct: 0.2 mg/dL (ref 0.1–0.5)
Indirect Bilirubin: 0.7 mg/dL (ref 0.3–0.9)
TOTAL PROTEIN: 6.1 g/dL — AB (ref 6.5–8.1)
Total Bilirubin: 0.9 mg/dL (ref 0.3–1.2)

## 2018-01-16 LAB — ECHOCARDIOGRAM COMPLETE
Height: 60 in
Weight: 2096 oz

## 2018-01-16 LAB — HEMOGLOBIN A1C
HEMOGLOBIN A1C: 5.5 % (ref 4.8–5.6)
MEAN PLASMA GLUCOSE: 111.15 mg/dL

## 2018-01-16 MED ORDER — HYDRALAZINE HCL 20 MG/ML IJ SOLN: 10.0000 mg | Freq: Four times a day (QID) | INTRAMUSCULAR | Status: DC | PRN

## 2018-01-16 MED ORDER — ATORVASTATIN CALCIUM 40 MG PO TABS: 40.0000 mg | ORAL_TABLET | Freq: Every day | ORAL | Status: AC

## 2018-01-16 MED ORDER — MAGNESIUM SULFATE 2 GM/50ML IV SOLN
2.0000 g | Freq: Once | INTRAVENOUS | Status: AC
  Administered 2018-01-16: 11:00:00 2 g via INTRAVENOUS
  Filled 2018-01-16: qty 50

## 2018-01-16 NOTE — Progress Notes (Signed)
OT Cancellation Note  Patient Details Name: Colleen Carson MRN: [id number] DOB: Oct 30, 1924   Cancelled Treatment:     OT order received, patient out for testing this am and then returned to room.  Patient and daughter report patient is back to her baseline level and has no complaints at this time of weakness, she complained of a slight headache and nursing in to administer meds.  Patient was screened and appears to have ROM WFLs, no coordination issues and independent with basic self care tasks.  Please reconsult if needs arise.   Amy T Lovett, OTR/L, CLT   Lovett,Amy 01/16/2018, 10:04 AM

## 2018-01-16 NOTE — Progress Notes (Signed)
Pt D/C to home with daughter. VSS. IV removed intact. Education completed. All questions answered.

## 2018-01-16 NOTE — Care Management Obs Status (Signed)
MEDICARE OBSERVATION STATUS NOTIFICATION   Patient Details  Name: ANGELICIA LESSNER MRN: [id number] Date of Birth: May 05, 1925   Medicare Observation Status Notification Given:  Yes    Marily Memos, RN 01/16/2018, 8:24 AM

## 2018-01-16 NOTE — Progress Notes (Signed)
PT Cancellation Note  Patient Details Name: PIERCE BIAGINI MRN: [id number] DOB: 1924-12-16   Cancelled Treatment:    Reason Eval/Treat Not Completed: Patient at procedure or test/unavailable. Order received.  Chart reviewed.  Pt not in room currently.  Will re-attempt later when time allows.   Glenetta Hew, PT, DPT 01/16/2018, 9:00 AM

## 2018-01-16 NOTE — Discharge Summary (Signed)
[employer] [facility] [place] at [facility]   PATIENT NAME: Colleen Carson    MR#:  [id number]  DATE OF BIRTH:  05/13/1925  DATE OF ADMISSION:  01/15/2018 ADMITTING PHYSICIAN: Ihor Austin, MD  DATE OF DISCHARGE: 01/16/2018  PRIMARY CARE PHYSICIAN: Kandyce Rud, MD    ADMISSION DIAGNOSIS:  Aphasia [R47.01] Near syncope [R55]  DISCHARGE DIAGNOSIS:  Active Problems:   TIA (transient ischemic attack)   SECONDARY DIAGNOSIS:   Past Medical History:  Diagnosis Date  . Arthritis   . Atrial fibrillation (HCC)   . CHF (congestive heart failure) (HCC)   . Dysrhythmia    Afib  . Hypercholesteremia   . Hypertension   . Lymphedema    lower extremities  . S/P total hip arthroplasty 01/25/2015  . Shingles   . 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  1.  TIA with aphasia: Patient symptoms have resolved.  Patient was evaluated by physical therapy as well as neurology.  Recommendations are to continue Eliquis.  Carotid ultrasound showed no hemodynamically significant stenosis.  MRI could not be obtained because patient has pacemaker 2.  PAF: Patient will continue on Eliquis and metoprolol 3.  Combined systolic and diastolic heart failure, chronic with EF of 40 to 45%: Patient had no signs of exacerbation.   4.  Status post pacemaker   DISCHARGE CONDITIONS AND DIET:   Stable for discharge on heart healthy diet  CONSULTS OBTAINED:  Treatment Team:  Thana Farr, MD  DRUG ALLERGIES:   Allergies  Allergen Reactions  . Morphine Nausea Only    Hallucinations, nausea, vomitting  . Morphine And Related Nausea And Vomiting    DISCHARGE MEDICATIONS:   Allergies as of 01/16/2018      Reactions   Morphine Nausea Only   Hallucinations, nausea, vomitting   Morphine And  Related Nausea And Vomiting      Medication List    TAKE these medications   atorvastatin 40 MG tablet Commonly known as:  LIPITOR Take 1 tablet (40 mg total) by mouth at bedtime.   ELIQUIS 5 MG Tabs tablet Generic drug:  apixaban Take 5 mg by mouth 2 (two) times daily.   furosemide 20 MG tablet Commonly known as:  LASIX Take 20 mg by mouth as needed for fluid or edema.   metoprolol tartrate 25 MG tablet Commonly known as:  LOPRESSOR Take 1 tablet (25 mg total) by mouth 2 (two) times daily.         Today   CHIEF COMPLAINT:   Patient symptoms have resolved.  Patient doing well this morning except for headache.   VITAL SIGNS:  Blood pressure (!) 146/80, pulse 71, temperature (!) 97.5 F (36.4 C), temperature source Oral, resp. rate 17, height 5' (1.524 m), weight 59.4 kg (131 lb), SpO2 98 %.   REVIEW OF SYSTEMS:  Review of Systems  Constitutional: Negative.  Negative for chills, fever and malaise/fatigue.  HENT: Negative.  Negative for ear discharge, ear pain, hearing loss, nosebleeds and sore throat.   Eyes: Negative.  Negative for blurred vision and pain.  Respiratory: Negative.  Negative for cough, hemoptysis, shortness of breath and wheezing.   Cardiovascular: Negative.  Negative for chest pain, palpitations and leg swelling.  Gastrointestinal: Negative.  Negative for abdominal pain, blood in stool,  diarrhea, nausea and vomiting.  Genitourinary: Negative.  Negative for dysuria.  Musculoskeletal: Negative.  Negative for back pain.  Skin: Negative.   Neurological: Positive for headaches. Negative for dizziness, tremors, speech change, focal weakness and seizures.  Endo/Heme/Allergies: Negative.  Does not bruise/bleed easily.  Psychiatric/Behavioral: Negative.  Negative for depression, hallucinations and suicidal ideas.     PHYSICAL EXAMINATION:  GENERAL:  84 y.o.-year-old patient lying in the bed with no acute distress.  NECK:  Supple, no jugular venous  distention. No thyroid enlargement, no tenderness.  LUNGS: Normal breath sounds bilaterally, no wheezing, rales,rhonchi  No use of accessory muscles of respiration.  CARDIOVASCULAR: S1, S2 normal. No murmurs, rubs, or gallops.  ABDOMEN: Soft, non-tender, non-distended. Bowel sounds present. No organomegaly or mass.  EXTREMITIES: No pedal edema, cyanosis, or clubbing.  PSYCHIATRIC: The patient is alert and oriented x 3.  SKIN: No obvious rash, lesion, or ulcer.   DATA REVIEW:   CBC Recent Labs  Lab 01/15/18 1307  WBC 5.4  HGB 14.3  HCT 43.1  PLT 142*    Chemistries  Recent Labs  Lab 01/15/18 1307 01/16/18 0505  NA 138  --   K 4.1  --   CL 101  --   CO2 30  --   GLUCOSE 141*  --   BUN 19  --   CREATININE 1.06*  --   CALCIUM 9.2  --   AST 47* 41  ALT 34 30  ALKPHOS 401* 348*  BILITOT 0.9 0.9    Cardiac Enzymes Recent Labs  Lab 01/15/18 1307  TROPONINI <0.03    Microbiology Results  @  RADIOLOGY:  Dg Chest 2 View  Result Date: 01/15/2018 CLINICAL DATA:  Near syncopal episode.  Weakness. EXAM: CHEST - 2 VIEW COMPARISON:  03/03/2017. FINDINGS: Cardiac monitoring leads noted over the chest. Density noted over the right upper chest most likely related to overlying leads. Cardiac pacer with lead tips over the right atrium right ventricle. Cardiomegaly with normal pulmonary vascularity. Mild bibasilar atelectasis. No pleural effusion or pneumothorax. IMPRESSION: 1. Cardiac pacer with lead tips over the right atrium right ventricle. Cardiomegaly. Normal pulmonary vascularity. 2.  Mild left base subsegmental atelectasis/scarring. Electronically Signed   By: Maisie Fus  Register   On: 01/15/2018 14:19   US Carotid Bilateral (at [facility] Only)  Result Date: 01/16/2018 CLINICAL DATA:  84 year old female with symptoms of cerebrovascular accident EXAM: BILATERAL CAROTID DUPLEX ULTRASOUND TECHNIQUE: Wallace Cullens scale imaging, color Doppler and duplex ultrasound were  performed of bilateral carotid and vertebral arteries in the neck. COMPARISON:  Most recent prior carotid duplex ultrasound 10/02/2016 FINDINGS: Criteria: Quantification of carotid stenosis is based on velocity parameters that correlate the residual internal carotid diameter with NASCET-based stenosis levels, using the diameter of the distal internal carotid lumen as the denominator for stenosis measurement. The following velocity measurements were obtained: RIGHT ICA: 71/16 cm/sec CCA: 66/17 cm/sec SYSTOLIC ICA/CCA RATIO:  1.1 ECA:  104 cm/sec LEFT ICA: 61/23 cm/sec CCA: 75/19 cm/sec SYSTOLIC ICA/CCA RATIO:  0.8 ECA:  78 cm/sec RIGHT CAROTID ARTERY: Heterogeneous atherosclerotic plaque in the distal common carotid artery extending into the proximal internal carotid artery. By peak systolic velocity criteria, the estimated stenosis is less than 50%. RIGHT VERTEBRAL ARTERY:  Patent with normal antegrade flow. LEFT CAROTID ARTERY: Trace heterogeneous atherosclerotic plaque in the proximal internal carotid artery. By peak systolic velocity criteria, the estimated stenosis is less than 50%. LEFT VERTEBRAL ARTERY:  Patent with normal antegrade flow. IMPRESSION: 1. Mild (1-49%)  stenosis proximal right internal carotid artery secondary to heterogenous atherosclerotic plaque. 2. Mild (1-49%) stenosis proximal left internal carotid artery secondary to heterogenous atherosclerotic plaque. 3. Vertebral arteries are patent with normal antegrade flow. Signed, Sterling Big, MD [facility] Radiology Electronically Signed   By: Malachy Moan M.D.   On: 01/16/2018 09:54   Ct Head Code Stroke Wo Contrast  Result Date: 01/15/2018 CLINICAL DATA:  Code stroke. Whole body weakness started 1 hour ago. EXAM: CT HEAD WITHOUT CONTRAST TECHNIQUE: Contiguous axial images were obtained from the base of the skull through the vertex without intravenous contrast. COMPARISON:   10/04/2016. FINDINGS: Brain: No evidence for acute infarction, hemorrhage, mass lesion, hydrocephalus, or extra-axial fluid. Generalized atrophy. Chronic microvascular ischemic change. Remote BILATERAL occipital infarcts, greater on the LEFT. Chronic LEFT frontal deep white matter infarcts. Vascular: No hyperdense vessel or unexpected calcification. Skull: Calcification of the cavernous internal carotid arteries consistent with cerebrovascular atherosclerotic disease. No signs of intracranial large vessel occlusion. Sinuses/Orbits: No acute finding. Other: None. ASPECTS [facility] Stroke Program Early CT Score) - Ganglionic level infarction (caudate, lentiform nuclei, internal capsule, insula, M1-M3 cortex): 7 - Supraganglionic infarction (M4-M6 cortex): 3 Total score (0-10 with 10 being normal): 10 IMPRESSION: 1. Atrophy and small vessel disease.  Chronic cerebral ischemia. 2. ASPECTS is 10. These results were called by telephone at the time of interpretation on 01/15/2018 at 1:20 pm to Dr. Willy Eddy , who verbally acknowledged these results. Electronically Signed   By: Elsie Stain M.D.   On: 01/15/2018 13:22      Allergies as of 01/16/2018      Reactions   Morphine Nausea Only   Hallucinations, nausea, vomitting   Morphine And Related Nausea And Vomiting      Medication List    TAKE these medications   atorvastatin 40 MG tablet Commonly known as:  LIPITOR Take 1 tablet (40 mg total) by mouth at bedtime.   ELIQUIS 5 MG Tabs tablet Generic drug:  apixaban Take 5 mg by mouth 2 (two) times daily.   furosemide 20 MG tablet Commonly known as:  LASIX Take 20 mg by mouth as needed for fluid or edema.   metoprolol tartrate 25 MG tablet Commonly known as:  LOPRESSOR Take 1 tablet (25 mg total) by mouth 2 (two) times daily.         Management plans discussed with the patient and she is in agreement. Stable for discharge home  Patient should follow up with pcp  CODE STATUS:      Code Status Orders  (From admission, onward)        Start     Ordered   01/15/18 1809  Do not attempt resuscitation (DNR)  Continuous    Question Answer Comment  In the event of cardiac or respiratory ARREST Do not call a "code blue"   In the event of cardiac or respiratory ARREST Do not perform Intubation, CPR, defibrillation or ACLS   In the event of cardiac or respiratory ARREST Use medication by any route, position, wound care, and other measures to relive pain and suffering. May use oxygen, suction and manual treatment of airway obstruction as needed for comfort.      01/15/18 1808    Code Status History    Date Active Date Inactive Code Status Order ID Comments User Context   01/15/2018 1725 01/15/2018 1808 Full Code [id number]  Ihor Austin, MD Inpatient   02/28/2017 0022 03/01/2017 1413 Full Code [id number]  Tonye Royalty, [place] Inpatient   12/25/2016 1153 12/25/2016 1547 DNR [id number]  Bo Mcclintock, RN Inpatient   12/24/2016 1503 12/25/2016 1153 Full Code [id number]  Adrian Saran, MD Inpatient   12/24/2016 1126 12/24/2016 1503 DNR [id number]  Adrian Saran, MD ED   10/02/2016 0041 10/02/2016 2212 DNR [id number]  Oralia Manis, MD Inpatient   05/15/2016 0219 05/15/2016 1519 DNR [id number]  Oralia Manis, MD Inpatient   03/11/2016 0839 03/13/2016 1609 DNR [id number]  Wyatt Haste, MD ED   03/11/2016 0839 03/11/2016 0839 Full Code [id number]  Hower, Cletis [place], MD ED   05/06/2015 0301 05/07/2015 1502 DNR [id number]  Arnaldo Natal, MD Inpatient   05/06/2015 0157 05/06/2015 0301 Full Code [id number]  Arnaldo Natal, MD ED   01/23/2015 1121 01/26/2015 1625 Full Code [id number]  Kennedy Bucker, MD Inpatient    Advance Directive Documentation     Most Recent Value  Type of Advance Directive  Healthcare Power of Attorney, Living will  Pre-existing out of facility DNR order (yellow form or pink MOST form)  -  "MOST" Form in Place?  -      TOTAL TIME TAKING CARE OF THIS PATIENT: 38 minutes.    Note: This  dictation was prepared with Dragon dictation along with smaller phrase technology. Any transcriptional errors that result from this process are unintentional.  Lessa Huge M.D on 01/16/2018 at 11:36 AM  Between 7am to 6pm - Pager - [phone] After 6pm go to [email or web address] - Social research officer, government  [facility] [place] [facility]  Office  [phone]  CC: Primary care physician; Kandyce Rud, MD

## 2018-01-16 NOTE — Evaluation (Signed)
SLP Cancellation Note  Patient Details Name: Colleen Carson MRN: [id number] DOB: [birth year]   Cancelled treatment:          SLP screened and no problems identified. Pt and daughter state speech has  Cleared and no ssx aspiration were observed. SLP educated to notify SLP if changes in condition occur.                                                                                                 Meredith Pel Sauber 01/16/2018, 9:16 AM

## 2018-01-16 NOTE — H&P (Signed)
Reason for Consult: word finding difficulty  Referring Physician: Dr. Tildon Husky   CC: word finding difficulty   HPI: Colleen Carson is an 84 y.o. female with a known history of atrial fibrillation on eilquis, congestive heart failure, hypertension, hyperlipidemia, arthritis, lymphedema which is chronic, CVA in the past presented to the emergency room because of trouble finding words and also felt that she was about to pass out.  That self resolved and pt is back to baseline.  Prior to episode complaining of generalized weakness. CTH no acute abnormalities.     Past Medical History:  Diagnosis Date  . Arthritis   . Atrial fibrillation (HCC)   . CHF (congestive heart failure) (HCC)   . Dysrhythmia    Afib  . Hypercholesteremia   . Hypertension   . Lymphedema    lower extremities  . S/P total hip arthroplasty 01/25/2015  . Shingles   . Stroke [facility])    noted ischemia on CT scan    Past Surgical History:  Procedure Laterality Date  . JOINT REPLACEMENT  01/23/15   Left THR Dr. Rosita Kea   . PACEMAKER INSERTION  2017  . TOTAL HIP ARTHROPLASTY Left 01/23/2015   Procedure: TOTAL HIP ARTHROPLASTY ANTERIOR APPROACH;  Surgeon: Kennedy Bucker, MD;  Location: [facility] ORS;  Service: Orthopedics;  Laterality: Left;    Family History  Problem Relation Age of Onset  . Hypertension Other     Social History:  reports that she has never smoked. She has never used smokeless tobacco. She reports that she does not drink alcohol or use drugs.  Allergies  Allergen Reactions  . Morphine Nausea Only    Hallucinations, nausea, vomitting  . Morphine And Related Nausea And Vomiting    Medications: I have reviewed the patient's current medications.  ROS: History obtained from the patient  General ROS: negative for - chills, fatigue, fever, night sweats, weight gain or weight loss Psychological ROS: negative for - behavioral disorder, hallucinations, memory difficulties, mood swings or suicidal ideation Ophthalmic  ROS: negative for - blurry vision, double vision, eye pain or loss of vision ENT ROS: negative for - epistaxis, nasal discharge, oral lesions, sore throat, tinnitus or vertigo Allergy and Immunology ROS: negative for - hives or itchy/watery eyes Hematological and Lymphatic ROS: negative for - bleeding problems, bruising or swollen lymph nodes Endocrine ROS: negative for - galactorrhea, hair pattern changes, polydipsia/polyuria or temperature intolerance Respiratory ROS: negative for - cough, hemoptysis, shortness of breath or wheezing Cardiovascular ROS: negative for - chest pain, dyspnea on exertion, edema or irregular heartbeat Gastrointestinal ROS: negative for - abdominal pain, diarrhea, hematemesis, nausea/vomiting or stool incontinence Genito-Urinary ROS: negative for - dysuria, hematuria, incontinence or urinary frequency/urgency Musculoskeletal ROS: negative for - joint swelling or muscular weakness Neurological ROS: as noted in HPI Dermatological ROS: negative for rash and skin lesion changes  Physical Examination: Blood pressure (!) 185/95, pulse 83, temperature (!) 97.5 F (36.4 C), temperature source Oral, resp. rate 16, height 5' (1.524 m), weight 131 lb (59.4 kg), SpO2 99 %.    Neurological Examination   Mental Status: Alert, oriented, thought content appropriate.  Speech fluent without evidence of aphasia.  Able to follow 3 step commands without difficulty. Cranial Nerves: II: Discs flat bilaterally; Visual fields grossly normal, pupils equal, round, reactive to light and accommodation III,IV, VI: ptosis not present, extra-ocular motions intact bilaterally V,VII: smile symmetric, facial light touch sensation normal bilaterally VIII: hearing normal bilaterally IX,X: gag reflex present XI: bilateral shoulder shrug XII:  midline tongue extension Motor: Right : Upper extremity   5/5    Left:     Upper extremity   5/5  Lower extremity   5/5     Lower extremity   5/5 Tone and  bulk:normal tone throughout; no atrophy noted Sensory: Pinprick and light touch intact throughout, bilaterally Deep Tendon Reflexes: 2+ and symmetric throughout Plantars: Right: downgoing   Left: downgoing Cerebellar: normal finger-to-nose, normal rapid alternating movements and normal heel-to-shin test Gait: not tested      Laboratory Studies:   Basic Metabolic Panel: Recent Labs  Lab 01/15/18 1307  NA 138  K 4.1  CL 101  CO2 30  GLUCOSE 141*  BUN 19  CREATININE 1.06*  CALCIUM 9.2    Liver Function Tests: Recent Labs  Lab 01/15/18 1307 01/16/18 0505  AST 47* 41  ALT 34 30  ALKPHOS 401* 348*  BILITOT 0.9 0.9  PROT 7.1 6.1*  ALBUMIN 4.4 3.4*   No results for input(s): LIPASE, AMYLASE in the last 168 hours. No results for input(s): AMMONIA in the last 168 hours.  CBC: Recent Labs  Lab 01/15/18 1307  WBC 5.4  NEUTROABS 2.0  HGB 14.3  HCT 43.1  MCV 91.1  PLT 142*    Cardiac Enzymes: Recent Labs  Lab 01/15/18 1307  TROPONINI <0.03    BNP: Invalid input(s): POCBNP  CBG: Recent Labs  Lab 01/15/18 1257  GLUCAP 115*    Microbiology: No results found for this or any previous visit.  Coagulation Studies: Recent Labs    01/15/18 1307  LABPROT 13.3  INR 1.02    Urinalysis:  Recent Labs  Lab 01/15/18 1355  COLORURINE STRAW*  LABSPEC 1.004*  PHURINE 6.0  GLUCOSEU NEGATIVE  HGBUR SMALL*  BILIRUBINUR NEGATIVE  KETONESUR NEGATIVE  PROTEINUR NEGATIVE  NITRITE NEGATIVE  LEUKOCYTESUR NEGATIVE    Lipid Panel:     Component Value Date/Time   CHOL 154 01/16/2018 0505   TRIG 63 01/16/2018 0505   HDL 78 01/16/2018 0505   CHOLHDL 2.0 01/16/2018 0505   VLDL 13 01/16/2018 0505   LDLCALC 63 01/16/2018 0505    HgbA1C:  Lab Results  Component Value Date   HGBA1C 5.7 (H) 12/25/2016    Urine Drug Screen:  No results found for: LABOPIA, COCAINSCRNUR, LABBENZ, AMPHETMU, THCU, LABBARB  Alcohol Level: No results for input(s): ETH in the  last 168 hours.  Other results: A-fib  Imaging: Dg Chest 2 View  Result Date: 01/15/2018 CLINICAL DATA:  Near syncopal episode.  Weakness. EXAM: CHEST - 2 VIEW COMPARISON:  03/03/2017. FINDINGS: Cardiac monitoring leads noted over the chest. Density noted over the right upper chest most likely related to overlying leads. Cardiac pacer with lead tips over the right atrium right ventricle. Cardiomegaly with normal pulmonary vascularity. Mild bibasilar atelectasis. No pleural effusion or pneumothorax. IMPRESSION: 1. Cardiac pacer with lead tips over the right atrium right ventricle. Cardiomegaly. Normal pulmonary vascularity. 2.  Mild left base subsegmental atelectasis/scarring. Electronically Signed   By: Maisie Fus  Register   On: 01/15/2018 14:19   US Carotid Bilateral (at [facility] Only)  Result Date: 01/16/2018 CLINICAL DATA:  84 year old female with symptoms of cerebrovascular accident EXAM: BILATERAL CAROTID DUPLEX ULTRASOUND TECHNIQUE: Wallace Cullens scale imaging, color Doppler and duplex ultrasound were performed of bilateral carotid and vertebral arteries in the neck. COMPARISON:  Most recent prior carotid duplex ultrasound 10/02/2016 FINDINGS: Criteria: Quantification of carotid stenosis is based on velocity parameters that correlate the residual internal carotid diameter with  NASCET-based stenosis levels, using the diameter of the distal internal carotid lumen as the denominator for stenosis measurement. The following velocity measurements were obtained: RIGHT ICA: 71/16 cm/sec CCA: 66/17 cm/sec SYSTOLIC ICA/CCA RATIO:  1.1 ECA:  104 cm/sec LEFT ICA: 61/23 cm/sec CCA: 75/19 cm/sec SYSTOLIC ICA/CCA RATIO:  0.8 ECA:  78 cm/sec RIGHT CAROTID ARTERY: Heterogeneous atherosclerotic plaque in the distal common carotid artery extending into the proximal internal carotid artery. By peak systolic velocity criteria, the estimated stenosis is less than 50%. RIGHT VERTEBRAL ARTERY:  Patent with normal antegrade flow.  LEFT CAROTID ARTERY: Trace heterogeneous atherosclerotic plaque in the proximal internal carotid artery. By peak systolic velocity criteria, the estimated stenosis is less than 50%. LEFT VERTEBRAL ARTERY:  Patent with normal antegrade flow. IMPRESSION: 1. Mild (1-49%) stenosis proximal right internal carotid artery secondary to heterogenous atherosclerotic plaque. 2. Mild (1-49%) stenosis proximal left internal carotid artery secondary to heterogenous atherosclerotic plaque. 3. Vertebral arteries are patent with normal antegrade flow. Signed, Sterling Big, MD [facility] Radiology Electronically Signed   By: Malachy Moan M.D.   On: 01/16/2018 09:54   Ct Head Code Stroke Wo Contrast  Result Date: 01/15/2018 CLINICAL DATA:  Code stroke. Whole body weakness started 1 hour ago. EXAM: CT HEAD WITHOUT CONTRAST TECHNIQUE: Contiguous axial images were obtained from the base of the skull through the vertex without intravenous contrast. COMPARISON:  10/04/2016. FINDINGS: Brain: No evidence for acute infarction, hemorrhage, mass lesion, hydrocephalus, or extra-axial fluid. Generalized atrophy. Chronic microvascular ischemic change. Remote BILATERAL occipital infarcts, greater on the LEFT. Chronic LEFT frontal deep white matter infarcts. Vascular: No hyperdense vessel or unexpected calcification. Skull: Calcification of the cavernous internal carotid arteries consistent with cerebrovascular atherosclerotic disease. No signs of intracranial large vessel occlusion. Sinuses/Orbits: No acute finding. Other: None. ASPECTS [facility] Stroke Program Early CT Score) - Ganglionic level infarction (caudate, lentiform nuclei, internal capsule, insula, M1-M3 cortex): 7 - Supraganglionic infarction (M4-M6 cortex): 3 Total score (0-10 with 10 being normal): 10 IMPRESSION: 1. Atrophy and small vessel disease.  Chronic cerebral ischemia. 2. ASPECTS is 10. These results were called  by telephone at the time of interpretation on 01/15/2018 at 1:20 pm to Dr. Willy Eddy , who verbally acknowledged these results. Electronically Signed   By: Elsie Stain M.D.   On: 01/15/2018 13:22     Assessment/Plan:  84 y.o. female with a known history of atrial fibrillation on eilquis, congestive heart failure, hypertension, hyperlipidemia, arthritis, lymphedema which is chronic, CVA in the past presented to the emergency room because of trouble finding words and also felt that she was about to pass out.  That self resolved and pt is back to baseline.  Prior to episode complaining of generalized weakness. CTH no acute abnormalities.    She has a PPM so unable to obtain MRI brain  Currently at baseline  US carotid no hemodynamic abnormalites Echo pending  - after work up complete would consider d/c planning - not sure there is a need for another CTH as even if lacunar stroke pt should con't same anticoagulation with eliquis - for headache will give 2g Mg Sulfate - d/w daughter and pt at bedside.  01/16/2018, 10:32 AM

## 2018-01-16 NOTE — Evaluation (Signed)
Physical Therapy Evaluation Patient Details Name: Colleen Carson MRN: [id number] DOB: 06-18-1925 Today's Date: 01/16/2018   History of Present Illness  Patient is a 84 year old female who was admitted from home s/p TIA following report of word finding difficulty and near syncope.  PMH includes Htn, Hypercholestrolemia, shingles, stroke, dysrhythmia, atrial fibrillation and arthritis.  Clinical Impression  Pt is a 84 year old female who lives in a two story home with her daughter and son-in-law.  She is independent and generally active.  Pt in bed upon PT arrival and reports no pain.  She is able to perform bed mobility and STS independently.  PT ambulated in room with supervision and demonstrated ability to bed to lift and object from floor and reach overhead as if opening a cabinet door without LOB or safety concern.  Pt presents with WNL strength and no report of sensation loss.  Pt is not currently appropriate for PT and order will be discontinued as of now.  Will reassess at a later time if appropriate.    Follow Up Recommendations No PT follow up    Equipment Recommendations  None recommended by PT    Recommendations for Other Services       Precautions / Restrictions Restrictions Weight Bearing Restrictions: No      Mobility  Bed Mobility Overal bed mobility: Independent                Transfers Overall transfer level: Independent                  Ambulation/Gait Ambulation/Gait assistance: Supervision Ambulation Distance (Feet): 50 Feet         General Gait Details: Good foot clearance, step length and gait speed.  Pt able to ambulate in room without use of UE support.  Stairs            Wheelchair Mobility    Modified Rankin (Stroke Patients Only)       Balance Overall balance assessment: Independent(Able to bend to lift object from floor and reach overhead bilaterally to mimic opening cabinet door without posterior LOB.)                                            Pertinent Vitals/Pain Pain Assessment: No/denies pain    Home Living Family/patient expects to be discharged to:: Private residence Living Arrangements: Children Available Help at Discharge: Family;Available PRN/intermittently Type of Home: House Home Access: Stairs to enter Entrance Stairs-Rails: Can reach both Entrance Stairs-Number of Steps: 3 Home Layout: Two level;Able to live on main level with bedroom/bathroom Home Equipment: Grab bars - toilet;Grab bars - tub/shower      Prior Function Level of Independence: Independent         Comments: Pt walks .5 mi/day for exercise.     Hand Dominance        Extremity/Trunk Assessment   Upper Extremity Assessment Upper Extremity Assessment: Overall WFL for tasks assessed(Grossly 4-/5 bilaterally.)    Lower Extremity Assessment Lower Extremity Assessment: Overall WFL for tasks assessed(Grossly 4/5 bilaterally.  No sensation loss noted.)    Cervical / Trunk Assessment Cervical / Trunk Assessment: Normal  Communication   Communication: No difficulties  Cognition Arousal/Alertness: Awake/alert Behavior During Therapy: WFL for tasks assessed/performed Overall Cognitive Status: Within Functional Limits for tasks assessed  General Comments      Exercises     Assessment/Plan    PT Assessment Patent does not need any further PT services  PT Problem List         PT Treatment Interventions      PT Goals (Current goals can be found in the Care Plan section)  Acute Rehab PT Goals PT Goal Formulation: All assessment and education complete, DC therapy    Frequency     Barriers to discharge        Co-evaluation               AM-PAC PT "6 Clicks" Daily Activity  Outcome Measure Difficulty turning over in bed (including adjusting bedclothes, sheets and blankets)?: None Difficulty moving from lying on back  to sitting on the side of the bed? : None Difficulty sitting down on and standing up from a chair with arms (e.g., wheelchair, bedside commode, etc,.)?: None Help needed moving to and from a bed to chair (including a wheelchair)?: None Help needed walking in hospital room?: None Help needed climbing 3-5 steps with a railing? : None 6 Click Score: 24    End of Session   Activity Tolerance: Patient tolerated treatment well Patient left: in bed;with call bell/phone within reach;with family/visitor present   PT Visit Diagnosis: Muscle weakness (generalized) (M62.81)    Time: 1035-1050 PT Time Calculation (min) (ACUTE ONLY): 15 min   Charges:   PT Evaluation $PT Eval Low Complexity: 1 Low     PT G Codes:   PT G-Codes **NOT FOR INPATIENT CLASS** Functional Assessment Tool Used: AM-PAC 6 Clicks Basic Mobility   Glenetta Hew, PT, DPT   Glenetta Hew 01/16/2018, 11:02 AM

## 2018-05-12 ENCOUNTER — Other Ambulatory Visit: Payer: Self-pay

## 2018-05-12 ENCOUNTER — Encounter: Payer: Self-pay | Admitting: Emergency Medicine

## 2018-05-12 ENCOUNTER — Emergency Department: Payer: Medicare Other

## 2018-05-12 ENCOUNTER — Emergency Department
Admission: EM | Admit: 2018-05-12 | Discharge: 2018-05-12 | Disposition: A | Discharge status: Home / Self Care | Payer: Medicare Other | Attending: Emergency Medicine | Admitting: Emergency Medicine

## 2018-05-12 DIAGNOSIS — R5383 Other fatigue: Secondary | ICD-10-CM | POA: Diagnosis not present

## 2018-05-12 DIAGNOSIS — Z79899 Other long term (current) drug therapy: Secondary | ICD-10-CM | POA: Diagnosis not present

## 2018-05-12 DIAGNOSIS — Z950 Presence of cardiac pacemaker: Secondary | ICD-10-CM | POA: Diagnosis not present

## 2018-05-12 DIAGNOSIS — I5022 Chronic systolic (congestive) heart failure: Secondary | ICD-10-CM | POA: Diagnosis not present

## 2018-05-12 DIAGNOSIS — R112 Nausea with vomiting, unspecified: Secondary | ICD-10-CM | POA: Diagnosis not present

## 2018-05-12 DIAGNOSIS — I110 Hypertensive heart disease with heart failure: Secondary | ICD-10-CM | POA: Diagnosis not present

## 2018-05-12 DIAGNOSIS — R51 Headache: Secondary | ICD-10-CM | POA: Diagnosis present

## 2018-05-12 DIAGNOSIS — R519 Headache, unspecified: Secondary | ICD-10-CM

## 2018-05-12 DIAGNOSIS — Z7901 Long term (current) use of anticoagulants: Secondary | ICD-10-CM | POA: Insufficient documentation

## 2018-05-12 DIAGNOSIS — Z96642 Presence of left artificial hip joint: Secondary | ICD-10-CM | POA: Diagnosis not present

## 2018-05-12 LAB — BASIC METABOLIC PANEL WITH GFR
Anion gap: 10 (ref 5–15)
BUN: 17 mg/dL (ref 8–23)
CO2: 28 mmol/L (ref 22–32)
Calcium: 9.5 mg/dL (ref 8.9–10.3)
Chloride: 99 mmol/L (ref 98–111)
Creatinine, Ser: 0.72 mg/dL (ref 0.44–1.00)
GFR calc Af Amer: >60 mL/min
GFR calc non Af Amer: >60 mL/min
Glucose, Bld: 135 mg/dL — ABNORMAL HIGH (ref 70–99)
Potassium: 3.8 mmol/L (ref 3.5–5.1)
Sodium: 137 mmol/L (ref 135–145)

## 2018-05-12 LAB — CBC
HEMATOCRIT: 45.8 % (ref 35.0–47.0)
Hemoglobin: 15.3 g/dL (ref 12.0–16.0)
MCH: 30.4 pg (ref 26.0–34.0)
MCHC: 33.5 g/dL (ref 32.0–36.0)
MCV: 90.8 fL (ref 80.0–100.0)
Platelets: 147 10*3/uL — ABNORMAL LOW (ref 150–440)
RBC: 5.04 MIL/uL (ref 3.80–5.20)
RDW: 15.3 % — AB (ref 11.5–14.5)
WBC: 5.8 10*3/uL (ref 3.6–11.0)

## 2018-05-12 LAB — URINALYSIS, COMPLETE (UACMP) WITH MICROSCOPIC
Bilirubin Urine: NEGATIVE
GLUCOSE, UA: NEGATIVE mg/dL
HGB URINE DIPSTICK: NEGATIVE
Ketones, ur: 5 mg/dL — AB
Leukocytes, UA: NEGATIVE
Nitrite: NEGATIVE
PH: 7 (ref 5.0–8.0)
Protein, ur: 30 mg/dL — AB
SPECIFIC GRAVITY, URINE: 1.01 (ref 1.005–1.030)

## 2018-05-12 LAB — TROPONIN I

## 2018-05-12 LAB — BRAIN NATRIURETIC PEPTIDE: B Natriuretic Peptide: 374 pg/mL — ABNORMAL HIGH (ref 0.0–100.0)

## 2018-05-12 MED ORDER — ONDANSETRON 4 MG PO TBDP
4.0000 mg | ORAL_TABLET | Freq: Once | ORAL | Status: AC
  Administered 2018-05-12: 4 mg via ORAL

## 2018-05-12 MED ORDER — ONDANSETRON 4 MG PO TBDP
4.0000 mg | ORAL_TABLET | Freq: Once | ORAL | Status: AC | PRN
  Administered 2018-05-12: 4 mg via ORAL

## 2018-05-12 MED ORDER — APIXABAN 5 MG PO TABS
5.0000 mg | ORAL_TABLET | Freq: Once | ORAL | Status: AC
  Administered 2018-05-12: 5 mg via ORAL
  Filled 2018-05-12: qty 1

## 2018-05-12 MED ORDER — METOPROLOL TARTRATE 25 MG PO TABS
25.0000 mg | ORAL_TABLET | Freq: Once | ORAL | Status: AC
  Administered 2018-05-12: 25 mg via ORAL
  Filled 2018-05-12: qty 1

## 2018-05-12 MED ORDER — ONDANSETRON 4 MG PO TBDP
ORAL_TABLET | ORAL | Status: AC
  Administered 2018-05-12: 4 mg via ORAL
  Filled 2018-05-12: qty 1

## 2018-05-12 MED ORDER — ACETAMINOPHEN 500 MG PO TABS
1000.0000 mg | ORAL_TABLET | Freq: Once | ORAL | Status: AC
  Administered 2018-05-12: 1000 mg via ORAL
  Filled 2018-05-12: qty 2

## 2018-05-12 MED ORDER — ONDANSETRON 4 MG PO TBDP
ORAL_TABLET | ORAL | Status: AC
  Filled 2018-05-12: qty 1

## 2018-05-12 NOTE — Discharge Instructions (Signed)
You have been seen in the Emergency Department (ED) for a headache. Your evaluation today was overall reassuring. Headaches have many possible causes. Most headaches aren't a sign of a more serious problem, and they will get better on their own.   Follow-up with your doctor in 12-24 hours if you are still having a headache. Otherwise follow up with your doctor in 3-5 days.  For pain take tylenol  When should you call for help?  Call 911 or return to the ED anytime you think you may need emergency care. For example, call if:  You have signs of a stroke. These may include:  Sudden numbness, paralysis, or weakness in your face, arm, or leg, especially on only one side of your body.  Sudden vision changes.  Sudden trouble speaking.  Sudden confusion or trouble understanding simple statements.  Sudden problems with walking or balance.  A sudden, severe headache that is different from past headaches. You have new or worsening headache Nausea and vomiting associated with your headache Fever, neck stiffness associated with your headache  Call your doctor now or seek immediate medical care if:  You have a new or worse headache.  Your headache gets much worse.  How can you care for yourself at home?  Do not drive if you have taken a prescription pain medicine.  Rest in a quiet, dark room until your headache is gone. Close your eyes and try to relax or go to sleep. Don't watch TV or read.  Put a cold, moist cloth or cold pack on the painful area for 10 to 20 minutes at a time. Put a thin cloth between the cold pack and your skin.  Use a warm, moist towel or a heating pad set on low to relax tight shoulder and neck muscles.  Have someone gently massage your neck and shoulders.  Take pain medicines exactly as directed.  If the doctor gave you a prescription medicine for pain, take it as prescribed.  If you are not taking a prescription pain medicine, ask your doctor if you can take an  over-the-counter medicine. Be careful not to take pain medicine more often than the instructions allow, because you may get worse or more frequent headaches when the medicine wears off.  Do not ignore new symptoms that occur with a headache, such as a fever, weakness or numbness, vision changes, or confusion. These may be signs of a more serious problem.  To prevent headaches  Keep a headache diary so you can figure out what triggers your headaches. Avoiding triggers may help you prevent headaches. Record when each headache began, how long it lasted, and what the pain was like (throbbing, aching, stabbing, or dull). Write down any other symptoms you had with the headache, such as nausea, flashing lights or dark spots, or sensitivity to bright light or loud noise. Note if the headache occurred near your period. List anything that might have triggered the headache, such as certain foods (chocolate, cheese, wine) or odors, smoke, bright light, stress, or lack of sleep.  Find healthy ways to deal with stress. Headaches are most common during or right after stressful times. Take time to relax before and after you do something that has caused a headache in the past.  Try to keep your muscles relaxed by keeping good posture. Check your jaw, face, neck, and shoulder muscles for tension, and try relaxing them. When sitting at a desk, change positions often, and stretch for 30 seconds each hour.  Get  plenty of sleep and exercise.  Eat regularly and well. Long periods without food can trigger a headache.  Treat yourself to a massage. Some people find that regular massages are very helpful in relieving tension.  Limit caffeine by not drinking too much coffee, tea, or soda. But don't quit caffeine suddenly, because that can also give you headaches.  Reduce eyestrain from computers by blinking frequently and looking away from the computer screen every so often. Make sure you have proper eyewear and that your monitor  is set up properly, about an arm's length away.  Seek help if you have depression or anxiety. Your headaches may be linked to these conditions. Treatment can both prevent headaches and help with symptoms of anxiety or depression.

## 2018-05-12 NOTE — ED Triage Notes (Signed)
Patient states that she woke up "not feeling well" and noticed that she had a 3 lb weight gain since yesterday. Daughter states she gave patient an extra 10 mg of lasix this morning. Patient reports history of CHF. States she has been nauseous and had a headache. Daughter states patient has been more lethargic than normal today. Patient denies any pain at this time.

## 2018-05-12 NOTE — ED Provider Notes (Signed)
[facility] Emergency Department Provider Note  ____________________________________________  Time seen: Approximately 7:25 PM  I have reviewed the triage vital signs and the nursing notes.   HISTORY  Chief Complaint Congestive Heart Failure and Nausea   HPI Colleen Carson is a 84 y.o. female with a history of A. fib on Eliquis, CHF, hypertension, hyperlipidemia, CVA, symptomatic bradycardia status post pacemaker who presents for evaluation of nausea and headache.  Patient reports she woke up feeling well this morning.  She took her blood pressure which was normal.  She weigh herself and noted that she had gained 3 pounds.  She took her medications including an extra half a pill of furosemide as recommended by her doctor.  After that patient started feeling nauseated and had one episode of nonbloody nonbilious emesis.  Her blood pressure was then elevated at home with systolics in the 180s.  Around 1 PM she developed a mild headache which progressed throughout the day.  A couple of hours later she took 2 Tylenol.  She continues to complain of a mild frontal headache that she describes as pressure.  She denies having frequent headaches but does have similar headaches in the past due to her sinuses.  She does endorse mild congestion.  She denies any trauma, slurred speech, facial droop, unilateral weakness or numbness, chest pain or shortness of breath, cough, abdominal pain, diarrhea, dysuria, fever or chills.  She continues to complain of feeling mildly fatigued.  Past Medical History:  Diagnosis Date  . Arthritis   . Atrial fibrillation (HCC)   . CHF (congestive heart failure) (HCC)   . Dysrhythmia    Afib  . Hypercholesteremia   . Hypertension   . Lymphedema    lower extremities  . S/P total hip arthroplasty 01/25/2015  . Shingles   . Stroke [facility])    noted ischemia on CT scan    Patient Active Problem List   Diagnosis Date Noted  . Chronic systolic heart  failure (HCC) 16/06/9603  . Anxiety 03/04/2017  . TIA (transient ischemic attack) 12/24/2016  . Acute encephalopathy 10/01/2016  . Symptomatic bradycardia 05/15/2016  . HTN (hypertension) 05/15/2016  . HLD (hyperlipidemia) 05/15/2016  . Atrial fibrillation (HCC) 03/11/2016  . AKI (acute kidney injury) (HCC) 05/06/2015  . S/P total hip arthroplasty 01/25/2015  . Primary osteoarthritis of left hip 01/23/2015    Past Surgical History:  Procedure Laterality Date  . JOINT REPLACEMENT  01/23/15   Left THR Dr. Rosita Kea   . PACEMAKER INSERTION  2017  . TOTAL HIP ARTHROPLASTY Left 01/23/2015   Procedure: TOTAL HIP ARTHROPLASTY ANTERIOR APPROACH;  Surgeon: Kennedy Bucker, MD;  Location: [facility] ORS;  Service: Orthopedics;  Laterality: Left;    Prior to Admission medications   Medication Sig Start Date End Date Taking? Authorizing Provider  apixaban (ELIQUIS) 5 MG TABS tablet Take 5 mg by mouth 2 (two) times daily.    [provider]  atorvastatin (LIPITOR) 40 MG tablet Take 1 tablet (40 mg total) by mouth at bedtime. 01/16/18   Adrian Saran, MD  furosemide (LASIX) 20 MG tablet Take 20 mg by mouth as needed for fluid or edema.     [provider]  metoprolol tartrate (LOPRESSOR) 25 MG tablet Take 1 tablet (25 mg total) by mouth 2 (two) times daily. 10/04/16 01/15/18  Sharman Cheek, MD    Allergies Morphine and Morphine and related  Family History  Problem Relation Age of Onset  . Hypertension Other  Social History Social History   Tobacco Use  . Smoking status: Never Smoker  . Smokeless tobacco: Never Used  Substance Use Topics  . Alcohol use: No  . Drug use: No    Review of Systems  Constitutional: Negative for fever. + fatigue Eyes: Negative for visual changes. ENT: Negative for sore throat. Neck: No neck pain  Cardiovascular: Negative for chest pain. Respiratory: Negative for shortness of breath. Gastrointestinal: Negative for abdominal pain or diarrhea. +  nausea/ vomiting Genitourinary: Negative for dysuria. Musculoskeletal: Negative for back pain. Skin: Negative for rash. Neurological: Negative for weakness or numbness. + HA Psych: No SI or HI  ____________________________________________   PHYSICAL EXAM:  VITAL SIGNS: ED Triage Vitals  Enc Vitals Group     BP 05/12/18 1713 (!) 160/96     Pulse Rate 05/12/18 1713 74     Resp 05/12/18 1713 18     Temp 05/12/18 1713 (!) 97.5 F (36.4 C)     Temp Source 05/12/18 1713 Oral     SpO2 05/12/18 1713 100 %     Weight 05/12/18 1715 132 lb (59.9 kg)     Height 05/12/18 1715 5' (1.524 m)     Head Circumference --      Peak Flow --      Pain Score 05/12/18 1729 0     Pain Loc --      Pain Edu? --      Excl. in GC? --     Constitutional: Alert and oriented. Well appearing and in no apparent distress. HEENT:      Head: Normocephalic and atraumatic.         Eyes: Conjunctivae are normal. Sclera is non-icteric.       Mouth/Throat: Mucous membranes are moist.       Neck: Supple with no signs of meningismus. Cardiovascular: Regular rate and rhythm. No murmurs, gallops, or rubs. 2+ symmetrical distal pulses are present in all extremities. No JVD. Respiratory: Normal respiratory effort. Lungs are clear to auscultation bilaterally. No wheezes, crackles, or rhonchi.  Gastrointestinal: Soft, non tender, and non distended with positive bowel sounds. No rebound or guarding. Musculoskeletal: Nontender with normal range of motion in all extremities. No edema, cyanosis, or erythema of extremities. Neurologic: Normal speech and language. A & O x3, PERRL, EOMI, no nystagmus, CN II-XII intact, motor testing reveals good tone and bulk throughout. There is no evidence of pronator drift or dysmetria. Muscle strength is 5/5 throughout.  Sensory examination is intact. Gait is normal. Skin: Skin is warm, dry and intact. No rash noted. Psychiatric: Mood and affect are normal. Speech and behavior are  normal.  ____________________________________________   LABS (all labs ordered are listed, but only abnormal results are displayed)  Labs Reviewed  BASIC METABOLIC PANEL - Abnormal; Notable for the following components:      Result Value   Glucose, Bld 135 (*)    All other components within normal limits  CBC - Abnormal; Notable for the following components:   RDW 15.3 (*)    Platelets 147 (*)    All other components within normal limits  BRAIN NATRIURETIC PEPTIDE - Abnormal; Notable for the following components:   B Natriuretic Peptide 374.0 (*)    All other components within normal limits  URINALYSIS, COMPLETE (UACMP) WITH MICROSCOPIC - Abnormal; Notable for the following components:   Color, Urine YELLOW (*)    APPearance CLEAR (*)    Ketones, ur 5 (*)    Protein, ur 30 (*)  Bacteria, UA RARE (*)    All other components within normal limits  URINE CULTURE  TROPONIN I   ____________________________________________  EKG  ED ECG REPORT I, Nita Sicklearolina Antonie Borjon, the attending physician, personally viewed and interpreted this ECG.  Atrial sensed ventricular paced rhythm, rate of 101, prolonged QTC, left axis deviation, no concordant ST elevations or depressions.  Unchanged from prior. ____________________________________________  RADIOLOGY  I have personally reviewed the images performed during this visit and I agree with the Radiologist's read.   Interpretation by Radiologist:  Dg Chest 2 View  Result Date: 05/12/2018 CLINICAL DATA:  Malaise after waking up. Patient noted a 3 pound weight gain since yesterday. EXAM: CHEST - 2 VIEW COMPARISON:  01/15/2018 FINDINGS: Stable enlarged cardiac silhouette. Moderate aortic atherosclerosis with ectasia. Calcified nodule at the right lung base consistent with a granuloma. No overt pulmonary edema, effusion or pneumothorax. Left-sided pacemaker apparatus with right atrial and right ventricular leads are stable in appearance. No acute  osseous abnormality. IMPRESSION: Stable cardiomegaly with aortic atherosclerosis. No overt pulmonary edema, effusion or pulmonary consolidation. Electronically Signed   By: Tollie Ethavid  Kwon M.D.   On: 05/12/2018 18:22   Ct Head Wo Contrast  Result Date: 05/12/2018 CLINICAL DATA:  Headache with nausea.  Lethargy EXAM: CT HEAD WITHOUT CONTRAST TECHNIQUE: Contiguous axial images were obtained from the base of the skull through the vertex without intravenous contrast. COMPARISON:  January 15, 2018 FINDINGS: Brain: The ventricles and sulci appear within normal limits for age. There is no intracranial mass, hemorrhage, extra-axial fluid collection, or midline shift. There is evidence of a prior infarct in the mid left occipital lobe, stable. There is mild patchy small vessel disease in the centra semiovale bilaterally, stable. There is no evident acute infarct. Vascular: There is no hyperdense vessel. There is calcification in each carotid siphon region. Skull: The bony calvarium appears intact. There is a small bony fragment slightly posterior and to the right of the superior odontoid, stable from previous study. Sinuses/Orbits: There is mucosal thickening in multiple ethmoid air cells. There is also mucosal thickening in the superior right maxillary antrum. Visualized orbits appear symmetric bilaterally. Other: Mastoid air cells are clear. IMPRESSION: Prior, stable infarct in the mid left occipital lobe. Mild periventricular small vessel disease. No acute infarct. No mass or hemorrhage. There are foci of arterial vascular calcification. There is mucosal thickening in several paranasal sinuses. A small focus of bone slightly posterior to the superior odontoid is chronic and stable in appearance. Electronically Signed   By: Bretta BangWilliam  Woodruff III M.D.   On: 05/12/2018 19:30     ____________________________________________   PROCEDURES  Procedure(s) performed: None Procedures Critical Care performed:   None ____________________________________________   INITIAL IMPRESSION / ASSESSMENT AND PLAN / ED COURSE  84 y.o. female with a history of A. fib on Eliquis, CHF, hypertension, hyperlipidemia, CVA, symptomatic bradycardia status post pacemaker who presents for evaluation of nausea and headache.  Patient is well-appearing and in no distress, blood pressure is trending down, she is afebrile with normal vitals, neurologically intact.  Lungs are clear, heart regular rate and rhythm, abdomen is soft, no meningeal signs.  EKG showing no evidence of dysrhythmias or ischemia.  Interrogation of pacemaker is pending.  Chest x-ray with no evidence of pneumonia or overt pulmonary edema.  BNP slightly elevated at 374.  Patient did take an extra Lasix at home.  BMP showing normal kidney function, CBC with no leukocytosis or anemia.  Troponin is negative. CT head done due  to HA, nausea, vomiting on a patient on Eliquis and showed no acute abnormalities. UA pending to rule out UTI. Nausea resolved after IV zofran.  Clinical Course as of May 12 2042  Wed May 12, 2018  [id number] Interrogation of pacer showed that patient has been going in and out of atrial tachycardia and was in it when the pacer was interrogated. Patient was also on telemetry at that time with paced rhythm at the rate of 60 and no obvious atrial tachycardia.    [CV]    Clinical Course User Index [CV] Don PerkingVeronese, [place], MD   _________________________ 8:41 PM on 05/12/2018 -----------------------------------------  Patient remains in a paced rhythm with a rate of 60 and no obvious dysrhythmias on telemetry.  Head CT, labs and urinalysis showed no acute findings.  She feels better after Zofran and Tylenol for her headache.  She has an appointment at the heart center tomorrow morning.  At this time I will discharge her home to the care of her daughter.  Discussed return precautions for chest pain, fever, signs of stroke, shortness of breath, abdominal  pain.  As part of my medical decision making, I reviewed the following data within the electronic MEDICAL RECORD NUMBER Nursing notes reviewed and incorporated, Labs reviewed , EKG interpreted , Old EKG reviewed, Old chart reviewed, Radiograph reviewed , Notes from prior ED visits and [place] Controlled Substance Database    Pertinent labs & imaging results that were available during my care of the patient were reviewed by me and considered in my medical decision making (see chart for details).    ____________________________________________   FINAL CLINICAL IMPRESSION(S) / ED DIAGNOSES  Final diagnoses:  Acute nonintractable headache, unspecified headache type  Fatigue, unspecified type  Non-intractable vomiting with nausea, unspecified vomiting type      NEW MEDICATIONS STARTED DURING THIS VISIT:  ED Discharge Orders    None       Note:  This document was prepared using Dragon voice recognition software and may include unintentional dictation errors.    Don PerkingVeronese, [place], MD 05/12/18 2043

## 2018-05-13 ENCOUNTER — Ambulatory Visit: Payer: Medicare Other | Admitting: Family

## 2018-05-14 LAB — URINE CULTURE

## 2018-05-17 ENCOUNTER — Ambulatory Visit: Payer: Medicare Other | Attending: Family | Admitting: Family

## 2018-05-17 ENCOUNTER — Encounter: Payer: Self-pay | Admitting: Family

## 2018-05-17 VITALS — BP 156/94 | HR 89 | Resp 18 | Ht 60.0 in | Wt 135.1 lb

## 2018-05-17 DIAGNOSIS — Z96642 Presence of left artificial hip joint: Secondary | ICD-10-CM | POA: Diagnosis not present

## 2018-05-17 DIAGNOSIS — I5032 Chronic diastolic (congestive) heart failure: Secondary | ICD-10-CM | POA: Diagnosis not present

## 2018-05-17 DIAGNOSIS — Z7901 Long term (current) use of anticoagulants: Secondary | ICD-10-CM | POA: Insufficient documentation

## 2018-05-17 DIAGNOSIS — E7800 Pure hypercholesterolemia, unspecified: Secondary | ICD-10-CM | POA: Insufficient documentation

## 2018-05-17 DIAGNOSIS — Z885 Allergy status to narcotic agent status: Secondary | ICD-10-CM | POA: Diagnosis not present

## 2018-05-17 DIAGNOSIS — I10 Essential (primary) hypertension: Secondary | ICD-10-CM

## 2018-05-17 DIAGNOSIS — I890 Lymphedema, not elsewhere classified: Secondary | ICD-10-CM | POA: Diagnosis not present

## 2018-05-17 DIAGNOSIS — Z79899 Other long term (current) drug therapy: Secondary | ICD-10-CM | POA: Insufficient documentation

## 2018-05-17 DIAGNOSIS — I4891 Unspecified atrial fibrillation: Secondary | ICD-10-CM | POA: Diagnosis not present

## 2018-05-17 DIAGNOSIS — Z8673 Personal history of transient ischemic attack (TIA), and cerebral infarction without residual deficits: Secondary | ICD-10-CM | POA: Insufficient documentation

## 2018-05-17 DIAGNOSIS — M1990 Unspecified osteoarthritis, unspecified site: Secondary | ICD-10-CM | POA: Insufficient documentation

## 2018-05-17 DIAGNOSIS — I110 Hypertensive heart disease with heart failure: Secondary | ICD-10-CM | POA: Insufficient documentation

## 2018-05-17 DIAGNOSIS — Z950 Presence of cardiac pacemaker: Secondary | ICD-10-CM | POA: Diagnosis not present

## 2018-05-17 DIAGNOSIS — I509 Heart failure, unspecified: Secondary | ICD-10-CM | POA: Diagnosis present

## 2018-05-17 NOTE — Progress Notes (Signed)
Patient ID: Colleen Carson, female    DOB: 01/09/1925, 84 y.o.   MRN: [id number]  HPI   Ms Laningham is a 84 y/o female with a history of arthritis, atrial fibrillation, hyperlipidemia, HTN, lymphedema, shingles, CVA and chronic heart failure.  Reviewed echo report done 01/16/18 which showed an EF of 55-65% along with moderate MR and moderate/severe TR. Reviewed echo report done 02/28/17 which showed an EF of 40-45% along with moderate AS, mild MR and mild/mod TR. PA pressure at 31 mm Hg.   Was in the ED 05/12/18 due to headache and nausea. Head CT and labs were normal and she was released. Admitted 01/15/18 due to TIA with aphasia. PT evaluation was done. Carotid ultrasound showed no significant stenosis. Discharged the following day.   She presents today for her follow-up visit with a chief complaint of minimal shortness of breath upon moderate exertion. She says that this has been present for several years. She has associated pedal edema and anxiety along with this. She denies any difficulty sleeping, abdominal distention, palpitations, chest pain, dizziness, fatigue or weight gain. Checks her BP at home and says that it's been good. Weight ranges at home between 131-133 pounds.   Past Medical History:  Diagnosis Date  . Arthritis   . Atrial fibrillation (HCC)   . CHF (congestive heart failure) (HCC)   . Dysrhythmia    Afib  . Hypercholesteremia   . Hypertension   . Lymphedema    lower extremities  . S/P total hip arthroplasty 01/25/2015  . Shingles   . Stroke [facility])    noted ischemia on CT scan   Past Surgical History:  Procedure Laterality Date  . JOINT REPLACEMENT  01/23/15   Left THR Dr. Rosita Kea   . PACEMAKER INSERTION  2017  . TOTAL HIP ARTHROPLASTY Left 01/23/2015   Procedure: TOTAL HIP ARTHROPLASTY ANTERIOR APPROACH;  Surgeon: Kennedy Bucker, MD;  Location: [facility] ORS;  Service: Orthopedics;  Laterality: Left;   Family History  Problem Relation Age of Onset  . Hypertension Other     Social History   Tobacco Use  . Smoking status: Never Smoker  . Smokeless tobacco: Never Used  Substance Use Topics  . Alcohol use: No   Allergies  Allergen Reactions  . Morphine Nausea Only    Hallucinations, nausea, vomitting  . Morphine And Related Nausea And Vomiting   Prior to Admission medications   Medication Sig Start Date End Date Taking? Authorizing Provider  apixaban (ELIQUIS) 5 MG TABS tablet Take 5 mg by mouth 2 (two) times daily.   Yes [provider]  atorvastatin (LIPITOR) 40 MG tablet Take 1 tablet (40 mg total) by mouth at bedtime. 01/16/18  Yes Mody, Patricia Pesa, MD  furosemide (LASIX) 20 MG tablet Take 20 mg by mouth as needed for fluid or edema.    Yes [provider]  metoprolol tartrate (LOPRESSOR) 25 MG tablet Take 1 tablet (25 mg total) by mouth 2 (two) times daily. 10/04/16 05/17/18 Yes Sharman Cheek, MD    Review of Systems  Constitutional: Negative for appetite change and fatigue.  HENT: Negative for congestion, postnasal drip and sore throat.   Eyes: Negative.   Respiratory: Positive for shortness of breath (rarely). Negative for cough and chest tightness.   Cardiovascular: Positive for leg swelling (R>L). Negative for chest pain and palpitations.  Gastrointestinal: Negative for abdominal distention and abdominal pain.  Endocrine: Negative.   Genitourinary: Negative.   Musculoskeletal: Negative for back pain and neck pain.  Skin: Negative.   Allergic/Immunologic: Negative.   Neurological: Negative for weakness and light-headedness.  Hematological: Negative for adenopathy. Does not bruise/bleed easily.  Psychiatric/Behavioral: Negative for dysphoric mood and sleep disturbance (sleeping on 1 pillow). The patient is nervous/anxious.    Vitals:   05/17/18 1533  BP: (!) 156/94  Pulse: 89  Resp: 18  SpO2: 98%  Weight: 135 lb 2 oz (61.3 kg)  Height: 5' (1.524 m)   Wt Readings from Last 3 Encounters:  05/17/18 135 lb 2 oz (61.3 kg)   05/12/18 132 lb (59.9 kg)  01/15/18 131 lb (59.4 kg)   Lab Results  Component Value Date   CREATININE 0.72 05/12/2018   CREATININE 1.06 (H) 01/15/2018   CREATININE 1.19 (H) 03/03/2017    Physical Exam  Constitutional: She is oriented to person, place, and time. She appears well-developed and well-nourished.  HENT:  Head: Normocephalic and atraumatic.  Neck: Normal range of motion. Neck supple. No JVD present.  Cardiovascular: Normal rate and regular rhythm.  Pulmonary/Chest: Effort normal. She has no wheezes. She has no rales.  Abdominal: Soft. She exhibits no distension. There is no tenderness.  Musculoskeletal: She exhibits edema (1+ pitting). She exhibits no tenderness.  Neurological: She is alert and oriented to person, place, and time.  Skin: Skin is warm and dry.  Psychiatric: Her behavior is normal. Thought content normal. Her mood appears anxious.  Nursing note and vitals reviewed.   Assessment & Plan:  1: Chronic heart failure with preserved ejection fraction- - NYHA class II - euvolemic today - Advised to call for an overnight weight gain of >2 pounds or a weekly weight gain of >5 pounds - weight unchanged from her last time here October 2018 - not adding salt to her food and her daughter is reading food labels and cooking foods from scratch. Reviewed the importance of closely following a 2000mg  sodium diet - encouraged her to drink 40-50 ounces of fluid daily - EF has improved so no longer would qualify for entresto - edema has returned but she says that she hasn't been wearing her compression socks; encouraged her to resume wearing them daily with removal at bedtime - saw cardiologist (Fath) 12/04/17  2: HTN- - BP mildly elevated - she checks it at home and says that it's been "good"; tends to elevate during the afternoons - saw PCP 04/21/18  Patient did not bring her medications nor a list. Each medication was verbally reviewed with the patient and she was  encouraged to bring the bottles to every visit to confirm accuracy of list.  Will not make a return appointment at this time. Advised her to call back at anytime to make another appointment.

## 2018-05-17 NOTE — Patient Instructions (Signed)
Continue weighing daily and call for an overnight weight gain of > 2 pounds or a weekly weight gain of >5 pounds. 

## 2018-05-18 ENCOUNTER — Ambulatory Visit: Payer: Medicare Other | Admitting: Family

## 2018-06-12 IMAGING — CT CT CHEST W/ CM
2 of 3 series · 15 of 36 positions shown, 18 images · IV contrast (iopamidol)
Comparison: Radiograph earlier today.

CLINICAL DATA: Chest and back pain, onset yesterday.

EXAM:
CT CHEST WITH CONTRAST
TECHNIQUE: Multidetector CT imaging of the chest was performed during
intravenous contrast administration.
CONTRAST:  60mL 3V9DS7-8NN IOPAMIDOL (3V9DS7-8NN) INJECTION 61%

[Series 2: axial st · axial · 0.72mm/px · z∈[-419,-191]mm · 12 of 134 slices shown, 15 images]
[im 10/134  mediastinal]
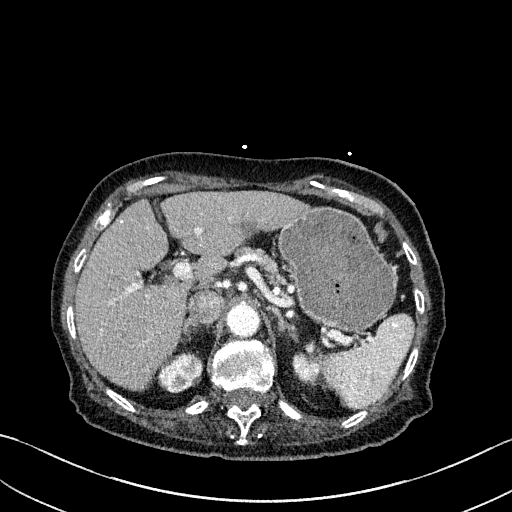
[im 10/134  lung]
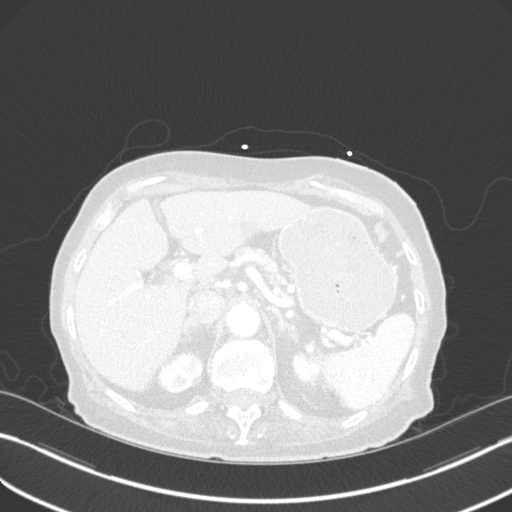
[im 20/134  lung]
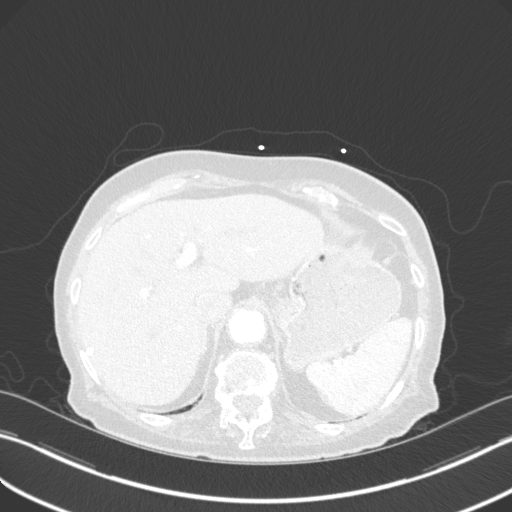
[im 30/134  lung]
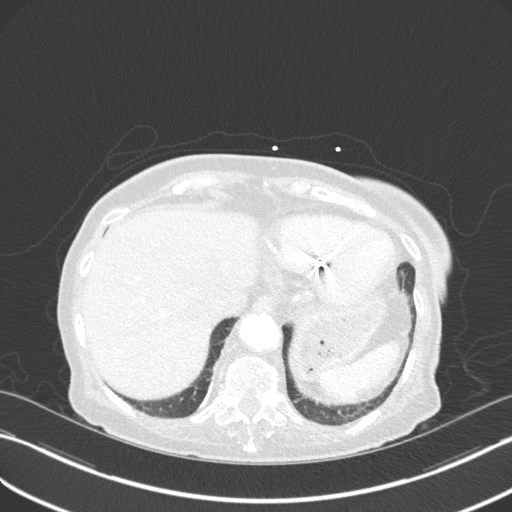
[im 40/134  lung]
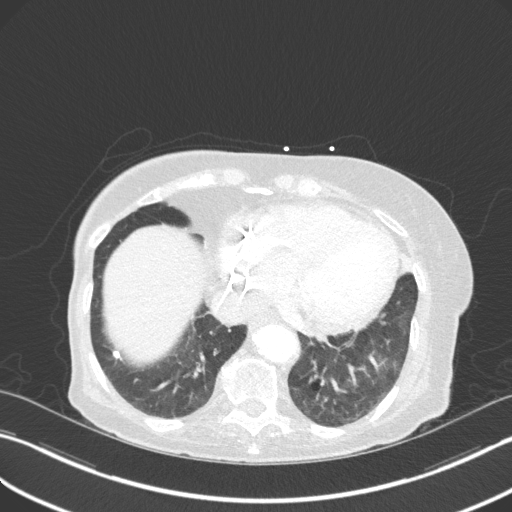
[im 50/134  mediastinal]
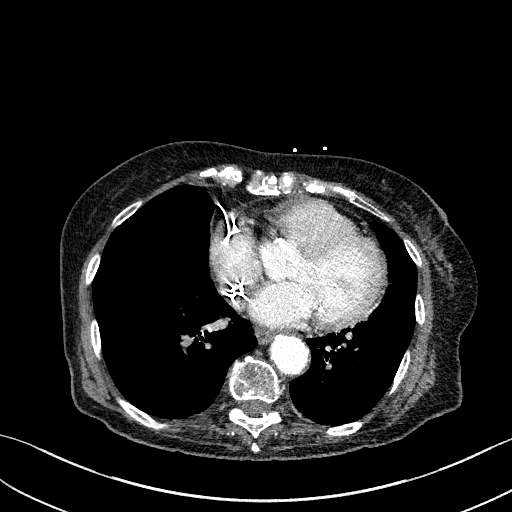
[im 50/134  lung]
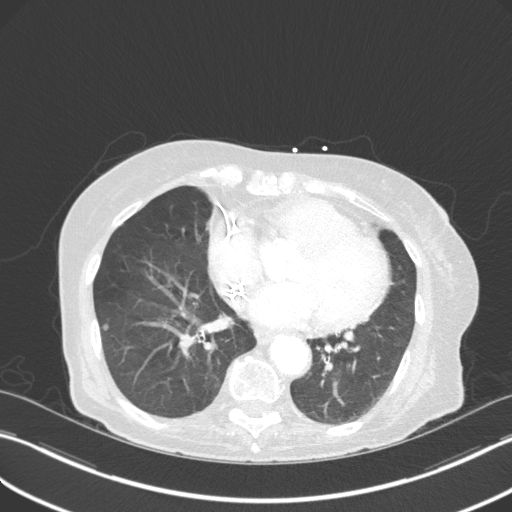
[im 60/134  lung]
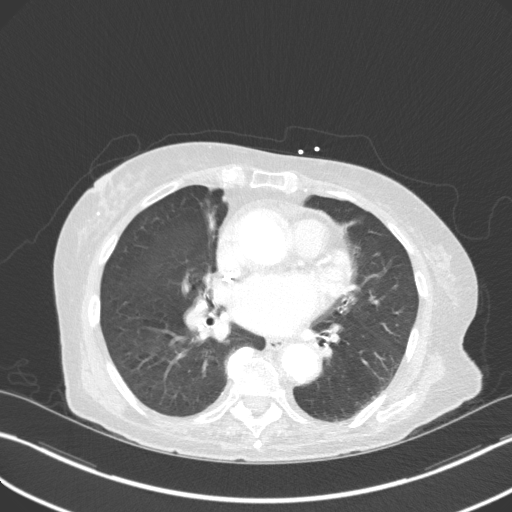
[im 74/134  lung]
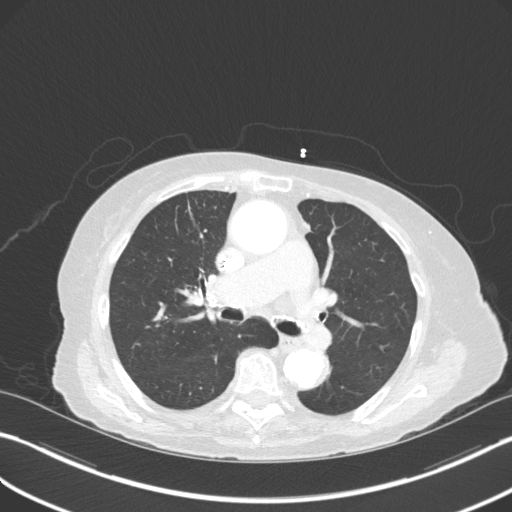
[im 84/134  lung]
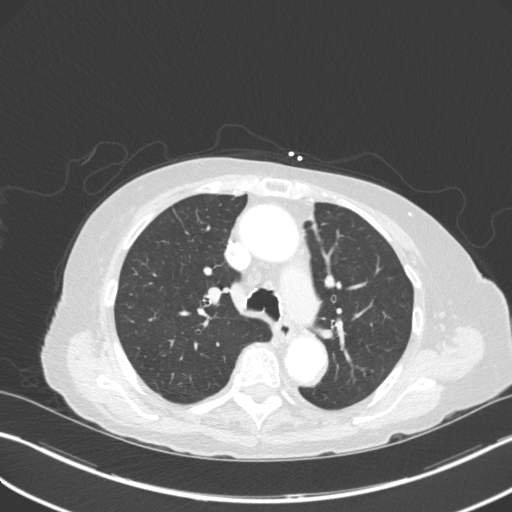
[im 94/134  mediastinal]
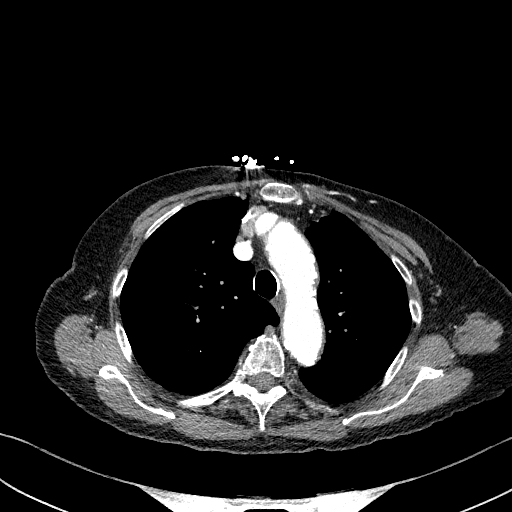
[im 94/134  lung]
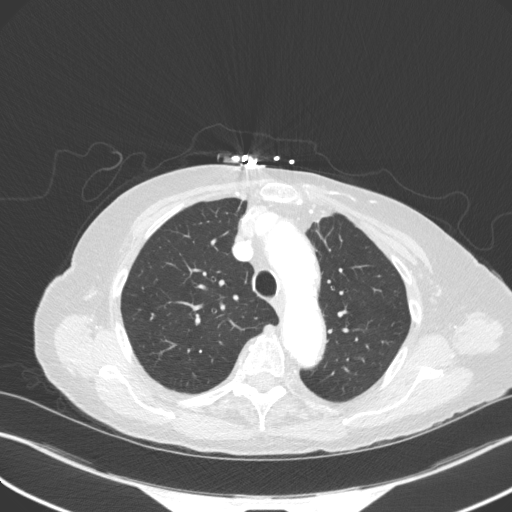
[im 104/134  lung]
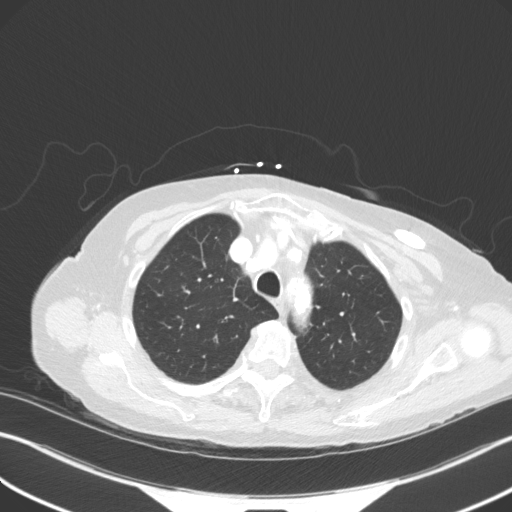
[im 114/134  lung]
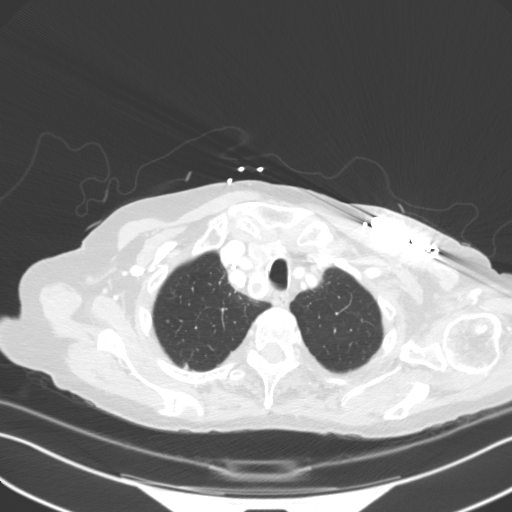
[im 124/134  lung]
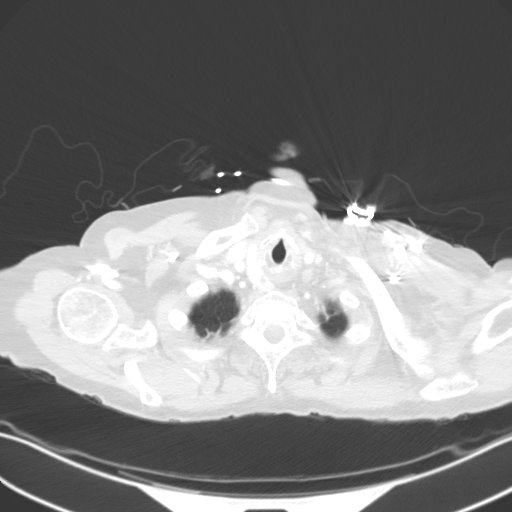

[Series 5: coronal · coronal · 0.57mm/px · 3 of 108 slices shown]
[im 22/108  lung]
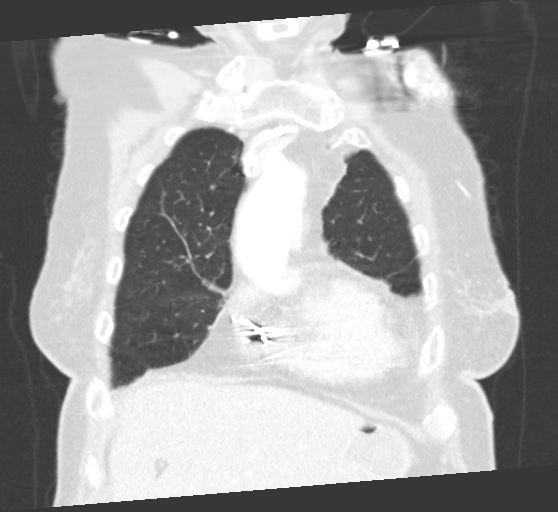
[im 43/108  lung]
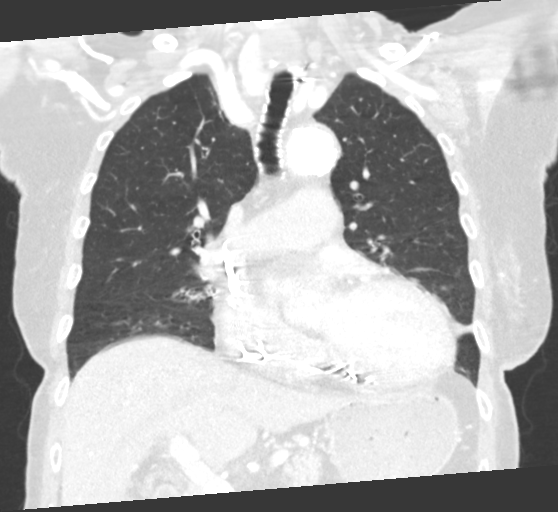
[im 65/108  lung]
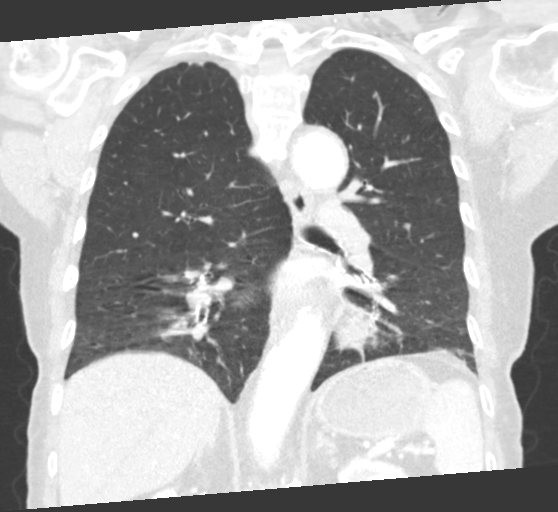

[15 of 36 positions shown; findings below may reference images not displayed]

FINDINGS: Cardiovascular: 4 cm aneurysm ascending aorta, no dissection.
Thoracic aorta is densely calcified with mild noncalcified plaque as
well. Descending thoracic aorta is tortuous. There are coronary
artery calcifications. Cardiac pacemaker with tips in the right
atrium and ventricle. No pericardial effusion.

Mediastinum/Nodes: Calcified right hilar and subcarinal lymph nodes.
No noncalcified mediastinal or hilar adenopathy. No axillary
adenopathy. Probable left thyroidectomy with heterogeneous right
thyroid lobe containing multiple nodules. The esophagus is
decompressed. Trachea is midline.

Lungs/Pleura: Calcified granuloma in the right middle and lower
lobe. Noncalcified subpleural nodule in the right lower lobe
measures 5 mm series 3, image 85. Tiny punctate and right upper lobe
nodule series 3, image 69. Scattered linear atelectasis or scarring.
No consolidation to suggest pneumonia. No pulmonary edema. No
pleural fluid.

Upper Abdomen: Splenic and hepatic granulomas. No acute abnormality.

Musculoskeletal: The bones appear under mineralized. There are no
acute or suspicious osseous abnormalities.
IMPRESSION: 1. No acute abnormality.
2. Coronary artery calcifications as well as advanced thoracic
atherosclerosis. Ascending aortic aneurysm, maximal dimension
cm. Recommend annual imaging followup by CTA. This recommendation
follows 8151 ACCF/AHA/AATS/ACR/ASA/SCA/JIRRA/RUDI/ROFIK/AUJLA Guidelines
for the Diagnosis and Management of Patients with Thoracic Aortic
Disease. Circulation. 8151; 121: [id number]
3. Sequela of prior granulomatous disease with calcified right lung
nodules and calcified nodes. There are small noncalcified nodules in
the right lung, largest measuring 5 mm. These are likely secondary
to prior granulomatous disease. These can be followed up on chest
CTA for aneurysm follow-up.

## 2018-08-04 ENCOUNTER — Encounter: Payer: Self-pay | Admitting: Emergency Medicine

## 2018-08-04 ENCOUNTER — Emergency Department: Payer: Medicare Other

## 2018-08-04 ENCOUNTER — Emergency Department
Admission: EM | Admit: 2018-08-04 | Discharge: 2018-08-04 | Disposition: A | Discharge status: Home / Self Care | Payer: Medicare Other | Attending: Emergency Medicine | Admitting: Emergency Medicine

## 2018-08-04 DIAGNOSIS — Z79899 Other long term (current) drug therapy: Secondary | ICD-10-CM | POA: Insufficient documentation

## 2018-08-04 DIAGNOSIS — I4891 Unspecified atrial fibrillation: Secondary | ICD-10-CM | POA: Diagnosis not present

## 2018-08-04 DIAGNOSIS — I110 Hypertensive heart disease with heart failure: Secondary | ICD-10-CM | POA: Diagnosis present

## 2018-08-04 DIAGNOSIS — I5042 Chronic combined systolic (congestive) and diastolic (congestive) heart failure: Secondary | ICD-10-CM | POA: Insufficient documentation

## 2018-08-04 DIAGNOSIS — Z7901 Long term (current) use of anticoagulants: Secondary | ICD-10-CM | POA: Diagnosis not present

## 2018-08-04 DIAGNOSIS — I10 Essential (primary) hypertension: Secondary | ICD-10-CM

## 2018-08-04 LAB — COMPREHENSIVE METABOLIC PANEL
ALBUMIN: 4 g/dL (ref 3.5–5.0)
ALT: 23 U/L (ref 0–44)
ANION GAP: 10 (ref 5–15)
AST: 34 U/L (ref 15–41)
Alkaline Phosphatase: 242 U/L — ABNORMAL HIGH (ref 38–126)
BUN: 22 mg/dL (ref 8–23)
CO2: 30 mmol/L (ref 22–32)
Calcium: 9.6 mg/dL (ref 8.9–10.3)
Chloride: 101 mmol/L (ref 98–111)
Creatinine, Ser: 0.95 mg/dL (ref 0.44–1.00)
GFR calc non Af Amer: 51 mL/min — ABNORMAL LOW (ref >60)
GFR, EST AFRICAN AMERICAN: 59 mL/min — AB (ref >60)
GLUCOSE: 117 mg/dL — AB (ref 70–99)
Potassium: 4 mmol/L (ref 3.5–5.1)
SODIUM: 141 mmol/L (ref 135–145)
Total Bilirubin: 0.8 mg/dL (ref 0.3–1.2)
Total Protein: 6.9 g/dL (ref 6.5–8.1)

## 2018-08-04 LAB — CBC WITH DIFFERENTIAL/PLATELET
Abs Immature Granulocytes: 0.01 10*3/uL (ref 0.00–0.07)
BASOS ABS: 0 10*3/uL (ref 0.0–0.1)
BASOS PCT: 1 %
EOS ABS: 0.1 10*3/uL (ref 0.0–0.5)
EOS PCT: 1 %
HCT: 42.5 % (ref 36.0–46.0)
Hemoglobin: 13.4 g/dL (ref 12.0–15.0)
Immature Granulocytes: 0 %
LYMPHS PCT: 26 %
Lymphs Abs: 1.5 10*3/uL (ref 0.7–4.0)
MCH: 29.8 pg (ref 26.0–34.0)
MCHC: 31.5 g/dL (ref 30.0–36.0)
MCV: 94.4 fL (ref 80.0–100.0)
MONO ABS: 0.5 10*3/uL (ref 0.1–1.0)
Monocytes Relative: 8 %
NEUTROS ABS: 3.8 10*3/uL (ref 1.7–7.7)
NEUTROS PCT: 64 %
NRBC: 0 % (ref 0.0–0.2)
PLATELETS: 149 10*3/uL — AB (ref 150–400)
RBC: 4.5 MIL/uL (ref 3.87–5.11)
RDW: 14.4 % (ref 11.5–15.5)
WBC: 5.9 10*3/uL (ref 4.0–10.5)

## 2018-08-04 LAB — TROPONIN I: Troponin I: <0.03 ng/mL (ref <0.03)

## 2018-08-04 MED ORDER — DIPHENHYDRAMINE HCL 25 MG PO CAPS
25.0000 mg | ORAL_CAPSULE | Freq: Once | ORAL | Status: AC
  Administered 2018-08-04: 25 mg via ORAL
  Filled 2018-08-04: qty 1

## 2018-08-04 MED ORDER — ALPRAZOLAM 0.5 MG PO TABS
0.2500 mg | ORAL_TABLET | Freq: Once | ORAL | Status: AC
  Administered 2018-08-04: 0.25 mg via ORAL
  Filled 2018-08-04: qty 1

## 2018-08-04 NOTE — ED Triage Notes (Signed)
Pt to ED this AM due to high BP readings at home of 180/111. Pt on lasix daily. Pt also c/o chest burning.

## 2018-08-04 NOTE — ED Notes (Signed)
X-ray at bedside

## 2018-08-04 NOTE — ED Provider Notes (Signed)
[facility] Emergency Department Provider Note ____________________________________________   I have reviewed the triage vital signs and the triage nursing note.  HISTORY  Chief Complaint Hypertension   Historian Patient Son in law (with whom she lives)  HPI Colleen Carson is a 84 y.o. female with chf, pacer, otherwise in great health (active and independent although lives with daughter and son-in-law), presents for elevated blood pressure this morning, feeling of anxiousness, an episode of nausea and indigestion sensation, and itchiness in the throat.  Patient states that several good days ago she was started on furosemide for leg edema with a history of CHF.  States that when she took the first tablet she had to pee all day and all night, so then she started to just take about a quarter of a tablet over the next couple days.  She did skip one day 2 days ago and then yesterday took a quarter of a tablet.  All night she states that she had a tickle in her throat which felt like itchy to her.  No other skin rash or skin itching.  No tongue swelling.  No trouble breathing or wheezing.  States that she had an intolerance to a medication a couple years ago that she discontinued second to that itchiness in her throat.  This morning she states that she was feeling agitated or anxious and was up early in sort of pacing around and took her blood pressure and it was 200/110 or so.  She does take Toprol for blood pressure, but typically has well-controlled blood pressures in the 120s systolic.  No headache.  No coughing or trouble breathing.  Reports that she has some chronic nasal congestion, but no new issues or concerns with allergies or nasal congestion.  Daughter reportedly has had upper respiratory virus.  Currently no chest pain, indigestion, abdominal pain, or nausea.  She still feels a little anxious.  Does report that about 20 years ago when she was on Lasix  at one point she did become hypokalemic.  She did take her Toprol this morning.    Past Medical History:  Diagnosis Date  . Arthritis   . Atrial fibrillation (HCC)   . CHF (congestive heart failure) (HCC)   . Dysrhythmia    Afib  . Hypercholesteremia   . Hypertension   . Lymphedema    lower extremities  . S/P total hip arthroplasty 01/25/2015  . Shingles   . Stroke [facility])    noted ischemia on CT scan    Patient Active Problem List   Diagnosis Date Noted  . Chronic diastolic heart failure (HCC) 05/17/2018  . Chronic systolic heart failure (HCC) 03/04/2017  . Anxiety 03/04/2017  . TIA (transient ischemic attack) 12/24/2016  . Acute encephalopathy 10/01/2016  . Symptomatic bradycardia 05/15/2016  . HTN (hypertension) 05/15/2016  . HLD (hyperlipidemia) 05/15/2016  . Atrial fibrillation (HCC) 03/11/2016  . AKI (acute kidney injury) (HCC) 05/06/2015  . S/P total hip arthroplasty 01/25/2015  . Primary osteoarthritis of left hip 01/23/2015    Past Surgical History:  Procedure Laterality Date  . JOINT REPLACEMENT  01/23/15   Left THR Dr. Rosita Kea   . PACEMAKER INSERTION  2017  . TOTAL HIP ARTHROPLASTY Left 01/23/2015   Procedure: TOTAL HIP ARTHROPLASTY ANTERIOR APPROACH;  Surgeon: Kennedy Bucker, MD;  Location: [facility] ORS;  Service: Orthopedics;  Laterality: Left;    Prior to Admission medications   Medication Sig Start Date End Date Taking? Authorizing Provider  acetaminophen (TYLENOL) 325 MG  tablet Take 650 mg by mouth every 6 (six) hours as needed.   Yes [provider]  apixaban (ELIQUIS) 5 MG TABS tablet Take 5 mg by mouth 2 (two) times daily.   Yes [provider]  atorvastatin (LIPITOR) 40 MG tablet Take 1 tablet (40 mg total) by mouth at bedtime. 01/16/18  Yes Mody, Patricia PesaSital, MD  furosemide (LASIX) 20 MG tablet Take 20 mg by mouth as needed for fluid or edema.    Yes [provider]  metoprolol tartrate (LOPRESSOR) 25 MG tablet Take 1 tablet (25 mg total)  by mouth 2 (two) times daily. 10/04/16 08/04/18 Yes Sharman CheekStafford, Phillip, MD  Multiple Vitamins-Minerals (CENTRUM SILVER 50+WOMEN PO) Take 1 tablet by mouth daily.   Yes [provider]    Allergies  Allergen Reactions  . Morphine Nausea Only    Hallucinations, nausea, vomitting  . Morphine And Related Nausea And Vomiting    Family History  Problem Relation Age of Onset  . Hypertension Other     Social History Social History   Tobacco Use  . Smoking status: Never Smoker  . Smokeless tobacco: Never Used  Substance Use Topics  . Alcohol use: No  . Drug use: No    Review of Systems  Constitutional: Negative for fever. Eyes: Negative for visual changes. ENT: Negative for sore throat, ports itching to the throat as per HPI. Cardiovascular: Negative for chest pain.  She did have some indigestion earlier without any vomiting. Respiratory: Negative for shortness of breath. Gastrointestinal: Negative for abdominal pain, vomiting and diarrhea. Genitourinary: Negative for dysuria. Musculoskeletal: Negative for back pain. Skin: Negative for rash. Neurological: Negative for headache.  ____________________________________________   PHYSICAL EXAM:  VITAL SIGNS: ED Triage Vitals  Enc Vitals Group     BP 08/04/18 0658 (!) 191/106     Pulse Rate 08/04/18 0658 90     Resp 08/04/18 0658 18     Temp 08/04/18 0658 98.3 F (36.8 C)     Temp Source 08/04/18 0658 Oral     SpO2 08/04/18 0658 98 %     Weight --      Height --      Head Circumference --      Peak Flow --      Pain Score 08/04/18 0712 0     Pain Loc --      Pain Edu? --      Excl. in GC? --      Constitutional: Alert and oriented.  HEENT      Head: Normocephalic and atraumatic.      Eyes: Conjunctivae are normal. Pupils equal and round.       Ears:         Nose: No congestion/rhinnorhea.      Mouth/Throat: Mucous membranes are moist.      Neck: No stridor. Cardiovascular/Chest: Normal rate, regular  rhythm.  No murmurs, rubs, or gallops. Respiratory: Normal respiratory effort without tachypnea nor retractions. Breath sounds are clear and equal bilaterally. No wheezes/rales/rhonchi. Gastrointestinal: Soft. No distention, no guarding, no rebound. Nontender.    Genitourinary/rectal:Deferred Musculoskeletal: Nontender with normal range of motion in all extremities. No joint effusions.  No lower extremity tenderness.  Mild lower extreme edema which is not pitting. Neurologic:  Normal speech and language. No gross or focal neurologic deficits are appreciated. Skin:  Skin is warm, dry and intact. No rash noted. Psychiatric: A little bit anxious, but overall mood and affect are normal. Speech and behavior are normal. Patient  exhibits appropriate insight and judgment.   ____________________________________________  LABS (pertinent positives/negatives) I, Governor Rooks, MD the attending physician have reviewed the labs noted below.  Labs Reviewed  COMPREHENSIVE METABOLIC PANEL - Abnormal; Notable for the following components:      Result Value   Glucose, Bld 117 (*)    Alkaline Phosphatase 242 (*)    GFR calc non Af Amer 51 (*)    GFR calc Af Amer 59 (*)    All other components within normal limits  CBC WITH DIFFERENTIAL/PLATELET - Abnormal; Notable for the following components:   Platelets 149 (*)    All other components within normal limits  TROPONIN I  TROPONIN I    ____________________________________________    EKG I, Governor Rooks, MD, the attending physician have personally viewed and interpreted all ECGs.  65 bpm.  Atrially sensed ventricularly paced rhythm. ____________________________________________  RADIOLOGY   Chest x-ray portable, viewed by myself and interpreted by radiologist:  IMPRESSION: Calcified granuloma right base. Mild bibasilar scarring. No edema or consolidation. Stable cardiac silhouette. Pacemaker leads attached to right atrium and right ventricle.  There is aortic atherosclerosis.  Aortic Atherosclerosis (ICD10-I70.0). __________________________________________  PROCEDURES  Procedure(s) performed: None  Procedures  Critical Care performed: None   ____________________________________________  ED COURSE / ASSESSMENT AND PLAN  Pertinent labs & imaging results that were available during my care of the patient were reviewed by me and considered in my medical decision making (see chart for details).    Patient follows with Dr. Lady Gary for cardiology and Dr. Dyke Brackett for primary care.  Blood pressure slightly elevated here.  She is not having any real cardiac concerning symptoms at present in terms of no chest discomfort, indigestion, nausea, or shortness of breath.  She is quitting a feeling somewhat anxious.  And then she is also complaining of some itchiness to the throat which is a little bit intermittent.  I am to go ahead and give her Benadryl for that.  Unclear whether or not this is actually an allergic reaction, but we will go ahead and try Benadryl.  Given the nonspecific nausea/indigestion earlier this morning around 6 AM, we will go ahead and send cardiac work-up.  She has no known prior coronary artery disease.  She does take Eliquis for history of A. Fib and stroke.         CONSULTATIONS:   None   Patient / Family / Caregiver informed of clinical course, medical decision-making process, and agree with plan.   I discussed return precautions, follow-up instructions, and discharge instructions with patient and/or family.  Discharge Instructions : Return to the emergency room immediately for any worsening condition including chest pain, trouble breathing, shortness breath, skin rash or itching, trouble swallowing, or any other symptoms concerning to you.  As we discussed, it is possible that you are not tolerating the furosemide, so we may go ahead and stop this.  Discussed with your primary care doctor whether to  start a new medication.    ___________________________________________   FINAL CLINICAL IMPRESSION(S) / ED DIAGNOSES   Final diagnoses:  Hypertension, unspecified type      ___________________________________________         Note: This dictation was prepared with Dragon dictation. Any transcriptional errors that result from this process are unintentional    Governor Rooks, MD 08/04/18 1553

## 2018-08-04 NOTE — ED Notes (Signed)
Family at bedside. 

## 2018-08-04 NOTE — Discharge Instructions (Addendum)
Return to the emergency room immediately for any worsening condition including chest pain, trouble breathing, shortness breath, skin rash or itching, trouble swallowing, or any other symptoms concerning to you.  As we discussed, it is possible that you are not tolerating the furosemide, so we may go ahead and stop this.  Discussed with your primary care doctor whether to start a new medication.

## 2018-11-10 ENCOUNTER — Emergency Department: Payer: Medicare Other

## 2018-11-10 ENCOUNTER — Other Ambulatory Visit: Payer: Self-pay

## 2018-11-10 ENCOUNTER — Encounter: Payer: Self-pay | Admitting: *Deleted

## 2018-11-10 ENCOUNTER — Emergency Department
Admission: EM | Admit: 2018-11-10 | Discharge: 2018-11-10 | Discharge status: Against Medical Advice | Payer: Medicare Other | Attending: Emergency Medicine | Admitting: Emergency Medicine

## 2018-11-10 DIAGNOSIS — R42 Dizziness and giddiness: Secondary | ICD-10-CM | POA: Diagnosis present

## 2018-11-10 DIAGNOSIS — Z5321 Procedure and treatment not carried out due to patient leaving prior to being seen by health care provider: Secondary | ICD-10-CM | POA: Diagnosis not present

## 2018-11-10 LAB — CBC
HCT: 43.5 % (ref 36.0–46.0)
Hemoglobin: 13.9 g/dL (ref 12.0–15.0)
MCH: 29.4 pg (ref 26.0–34.0)
MCHC: 32 g/dL (ref 30.0–36.0)
MCV: 92 fL (ref 80.0–100.0)
NRBC: 0 % (ref 0.0–0.2)
PLATELETS: 179 10*3/uL (ref 150–400)
RBC: 4.73 MIL/uL (ref 3.87–5.11)
RDW: 15.3 % (ref 11.5–15.5)
WBC: 6.1 10*3/uL (ref 4.0–10.5)

## 2018-11-10 LAB — BASIC METABOLIC PANEL
Anion gap: 10 (ref 5–15)
BUN: 26 mg/dL — ABNORMAL HIGH (ref 8–23)
CALCIUM: 9.5 mg/dL (ref 8.9–10.3)
CO2: 25 mmol/L (ref 22–32)
Chloride: 103 mmol/L (ref 98–111)
Creatinine, Ser: 0.91 mg/dL (ref 0.44–1.00)
GFR calc Af Amer: >60 mL/min (ref >60)
GFR calc non Af Amer: 55 mL/min — ABNORMAL LOW (ref >60)
Glucose, Bld: 100 mg/dL — ABNORMAL HIGH (ref 70–99)
POTASSIUM: 4.1 mmol/L (ref 3.5–5.1)
Sodium: 138 mmol/L (ref 135–145)

## 2018-11-10 LAB — TROPONIN I

## 2018-11-10 MED ORDER — SODIUM CHLORIDE 0.9% FLUSH: 3.0000 mL | Freq: Once | INTRAVENOUS | Status: DC

## 2018-11-10 NOTE — ED Triage Notes (Addendum)
Pt triage via wheelchair.  Pt reports dizziness and sob.  Pt also reports abd pain.  Pt is nauseated and feeling weak today.  Pt alert  Speech clear. Pt has a pacemaker.  Pt is on blood thinners

## 2018-11-10 NOTE — ED Notes (Signed)
Pt states she wants to go home. Pt encouraged to stay, education provided regarding risks of leaving AMA. Pt states she saw a PA at her PCP office today and will follow up with PCP this week. She does not wish to stay for ED evaluation. Pt left prior to signing AMA form.

## 2019-06-21 ENCOUNTER — Emergency Department
Admission: EM | Admit: 2019-06-21 | Discharge: 2019-06-21 | Disposition: A | Discharge status: Home / Self Care | Payer: Medicare Other | Attending: Emergency Medicine | Admitting: Emergency Medicine

## 2019-06-21 ENCOUNTER — Emergency Department: Payer: Medicare Other

## 2019-06-21 ENCOUNTER — Other Ambulatory Visit: Payer: Self-pay

## 2019-06-21 DIAGNOSIS — Z8673 Personal history of transient ischemic attack (TIA), and cerebral infarction without residual deficits: Secondary | ICD-10-CM | POA: Insufficient documentation

## 2019-06-21 DIAGNOSIS — Z20828 Contact with and (suspected) exposure to other viral communicable diseases: Secondary | ICD-10-CM | POA: Insufficient documentation

## 2019-06-21 DIAGNOSIS — Z79899 Other long term (current) drug therapy: Secondary | ICD-10-CM | POA: Diagnosis not present

## 2019-06-21 DIAGNOSIS — R531 Weakness: Secondary | ICD-10-CM | POA: Diagnosis present

## 2019-06-21 DIAGNOSIS — I509 Heart failure, unspecified: Secondary | ICD-10-CM

## 2019-06-21 DIAGNOSIS — Z7901 Long term (current) use of anticoagulants: Secondary | ICD-10-CM | POA: Insufficient documentation

## 2019-06-21 DIAGNOSIS — I110 Hypertensive heart disease with heart failure: Secondary | ICD-10-CM | POA: Diagnosis not present

## 2019-06-21 DIAGNOSIS — I5042 Chronic combined systolic (congestive) and diastolic (congestive) heart failure: Secondary | ICD-10-CM | POA: Insufficient documentation

## 2019-06-21 LAB — BASIC METABOLIC PANEL
Anion gap: 11 (ref 5–15)
BUN: 23 mg/dL (ref 8–23)
CO2: 26 mmol/L (ref 22–32)
Calcium: 9.6 mg/dL (ref 8.9–10.3)
Chloride: 102 mmol/L (ref 98–111)
Creatinine, Ser: 0.89 mg/dL (ref 0.44–1.00)
GFR calc Af Amer: >60 mL/min (ref >60)
GFR calc non Af Amer: 56 mL/min — ABNORMAL LOW (ref >60)
Glucose, Bld: 115 mg/dL — ABNORMAL HIGH (ref 70–99)
Potassium: 4.1 mmol/L (ref 3.5–5.1)
Sodium: 139 mmol/L (ref 135–145)

## 2019-06-21 LAB — CBC
HCT: 42.9 % (ref 36.0–46.0)
Hemoglobin: 14 g/dL (ref 12.0–15.0)
MCH: 29.4 pg (ref 26.0–34.0)
MCHC: 32.6 g/dL (ref 30.0–36.0)
MCV: 89.9 fL (ref 80.0–100.0)
Platelets: 151 10*3/uL (ref 150–400)
RBC: 4.77 MIL/uL (ref 3.87–5.11)
RDW: 16.1 % — ABNORMAL HIGH (ref 11.5–15.5)
WBC: 6.2 10*3/uL (ref 4.0–10.5)
nRBC: 0 % (ref 0.0–0.2)

## 2019-06-21 LAB — URINALYSIS, COMPLETE (UACMP) WITH MICROSCOPIC
Bacteria, UA: NONE SEEN
Bilirubin Urine: NEGATIVE
Glucose, UA: NEGATIVE mg/dL
Hgb urine dipstick: NEGATIVE
Ketones, ur: NEGATIVE mg/dL
Leukocytes,Ua: NEGATIVE
Nitrite: NEGATIVE
Protein, ur: NEGATIVE mg/dL
Specific Gravity, Urine: 1.005 (ref 1.005–1.030)
Squamous Epithelial / HPF: NONE SEEN (ref 0–5)
pH: 7 (ref 5.0–8.0)

## 2019-06-21 LAB — TROPONIN I (HIGH SENSITIVITY)
Troponin I (High Sensitivity): 11 ng/L (ref <18)
Troponin I (High Sensitivity): 10 ng/L (ref <18)

## 2019-06-21 LAB — BRAIN NATRIURETIC PEPTIDE: B Natriuretic Peptide: 334 pg/mL — ABNORMAL HIGH (ref 0.0–100.0)

## 2019-06-21 LAB — SARS CORONAVIRUS 2 (TAT 6-24 HRS): SARS Coronavirus 2: NEGATIVE

## 2019-06-21 MED ORDER — FUROSEMIDE 10 MG/ML IJ SOLN
20.0000 mg | Freq: Once | INTRAMUSCULAR | Status: AC
  Administered 2019-06-21: 20 mg via INTRAVENOUS
  Filled 2019-06-21: qty 4

## 2019-06-21 MED ORDER — POTASSIUM CHLORIDE CRYS ER 20 MEQ PO TBCR
20.0000 meq | EXTENDED_RELEASE_TABLET | Freq: Once | ORAL | Status: AC
  Administered 2019-06-21: 20 meq via ORAL
  Filled 2019-06-21: qty 1

## 2019-06-21 NOTE — ED Provider Notes (Signed)
[facility] Emergency Department Provider Note   ____________________________________________   First MD Initiated Contact with Patient 06/21/19 1050     (approximate)  I have reviewed the triage vital signs and the nursing notes.   HISTORY  Chief Complaint Weakness    HPI Colleen Carson is a 85 y.o. female history of A. fib, CHF, hypertension  Patient presents today as she has been feeling fatigued.  She got up to the bathroom about 4 AM lives with her daughter.  There she did not pass out but felt very weak, she was unable to get herself up in the bathroom and had to be assisted up.  She went back to bed and has since been out of 8/2 a banana but she has had what she describes as a few episodes where she will just feel slightly short of breath and feel lightheaded.  Sometimes she feels as though she is going to pass out but has not.  She is been in good health and denies any exposure to any infections or COVID.  She is not noticed any other symptoms.  Right now she feels probably fine but then she will start to feel a wave of feeling where she will start to feel little short of breath sometimes tingly in both of her hands and then will go away.   Past Medical History:  Diagnosis Date   Arthritis    Atrial fibrillation (HCC)    CHF (congestive heart failure) (HCC)    Dysrhythmia    Afib   Hypercholesteremia    Hypertension    Lymphedema    lower extremities   S/P total hip arthroplasty 01/25/2015   Shingles    Stroke [facility])    noted ischemia on CT scan    Patient Active Problem List   Diagnosis Date Noted   Chronic diastolic heart failure (HCC) 05/17/2018   Chronic systolic heart failure (HCC) 03/04/2017   Anxiety 03/04/2017   TIA (transient ischemic attack) 12/24/2016   Acute encephalopathy 10/01/2016   Symptomatic bradycardia 05/15/2016   HTN (hypertension) 05/15/2016   HLD (hyperlipidemia) 05/15/2016   Atrial  fibrillation (HCC) 03/11/2016   AKI (acute kidney injury) (HCC) 05/06/2015   S/P total hip arthroplasty 01/25/2015   Primary osteoarthritis of left hip 01/23/2015    Past Surgical History:  Procedure Laterality Date   JOINT REPLACEMENT  01/23/15   Left THR Dr. Rosita Kea    PACEMAKER INSERTION  2017   TOTAL HIP ARTHROPLASTY Left 01/23/2015   Procedure: TOTAL HIP ARTHROPLASTY ANTERIOR APPROACH;  Surgeon: Kennedy Bucker, MD;  Location: [facility] ORS;  Service: Orthopedics;  Laterality: Left;    Prior to Admission medications   Medication Sig Start Date End Date Taking? Authorizing Provider  apixaban (ELIQUIS) 5 MG TABS tablet Take 5 mg by mouth 2 (two) times daily.   Yes [provider]  atorvastatin (LIPITOR) 40 MG tablet Take 1 tablet (40 mg total) by mouth at bedtime. 01/16/18  Yes Mody, Patricia Pesa, MD  Cetirizine HCl 10 MG CAPS Take 10 mg by mouth at bedtime.   Yes [provider]  metoprolol tartrate (LOPRESSOR) 50 MG tablet Take 50 mg by mouth 2 (two) times daily.   Yes [provider]  Multiple Vitamins-Minerals (CENTRUM SILVER 50+WOMEN PO) Take 1 tablet by mouth daily.   Yes [provider]    Allergies Morphine and Morphine and related  Family History  Problem Relation Age of Onset   Hypertension Other  Social History Social History   Tobacco Use   Smoking status: Never Smoker   Smokeless tobacco: Never Used  Substance Use Topics   Alcohol use: No   Drug use: No    Review of Systems Constitutional: No fever/chills Eyes: No visual changes. ENT: No sore throat. Cardiovascular: Denies chest pain. Respiratory: Denies shortness of breath except for a couple episodes have been brief where she just feels like she is having a little trouble catching her breath. Gastrointestinal: No abdominal pain.   Genitourinary: Negative for dysuria. Musculoskeletal: Negative for back pain. Skin: Negative for rash. Neurological: Negative for headaches,  areas of focal weakness or numbness.  Exception is she will feel little tingly in both of her hands during the episodes where she feels slightly short of breath and then it goes away.  She did not fall or sustain any injury, daughter also confirms this.    ____________________________________________   PHYSICAL EXAM:  VITAL SIGNS: ED Triage Vitals  Enc Vitals Group     BP 06/21/19 1014 (!) 181/108     Pulse Rate 06/21/19 1014 80     Resp 06/21/19 1014 14     Temp 06/21/19 1014 97.8 F (36.6 C)     Temp Source 06/21/19 1014 Oral     SpO2 06/21/19 1014 96 %     Weight 06/21/19 1008 135 lb (61.2 kg)     Height 06/21/19 1008 5\' 1"  (1.549 m)     Head Circumference --      Peak Flow --      Pain Score 06/21/19 1008 0     Pain Loc --      Pain Edu? --      Excl. in [place]? --     Constitutional: Alert and oriented. Well appearing and in no acute distress.  She and her daughter both very pleasant. Eyes: Conjunctivae are normal. Head: Atraumatic. Nose: No congestion/rhinnorhea. Mouth/Throat: Mucous membranes are moist. Neck: No stridor.  Cardiovascular: Normal rate, regular rhythm. Grossly normal heart sounds.  Good peripheral circulation. Respiratory: Normal respiratory effort.  No retractions. Lungs CTAB except I suspect there is some slightly diminished lung sounds in the right lower base compared to the left. Gastrointestinal: Soft and nontender. No distention. Musculoskeletal: No lower extremity tenderness and has bilateral lower extremity lymphedema which the patient and daughter both report is chronic and not worsened.  She does move all extremities with normal strength.  There is no pronator drift.  Extraocular movements are normal.  Normal sensation to all extremities.  Normal and symmetric smile and cranial nerve exam. Neurologic:  Normal speech and language. No gross focal neurologic deficits are appreciated.  She does report just a slight headache feeling that is been present  for about a day.  Very minimal. Skin:  Skin is warm, dry and intact. No rash noted. Psychiatric: Mood and affect are normal. Speech and behavior are normal.  ____________________________________________   LABS (all labs ordered are listed, but only abnormal results are displayed)  Labs Reviewed  BASIC METABOLIC PANEL - Abnormal; Notable for the following components:      Result Value   Glucose, Bld 115 (*)    GFR calc non Af Amer 56 (*)    All other components within normal limits  URINALYSIS, COMPLETE (UACMP) WITH MICROSCOPIC - Abnormal; Notable for the following components:   Color, Urine YELLOW (*)    APPearance CLEAR (*)    All other components within normal limits  CBC - Abnormal; Notable  for the following components:   RDW 16.1 (*)    All other components within normal limits  BRAIN NATRIURETIC PEPTIDE - Abnormal; Notable for the following components:   B Natriuretic Peptide 334.0 (*)    All other components within normal limits  SARS CORONAVIRUS 2 (TAT 6-24 HRS)  TROPONIN I (HIGH SENSITIVITY)  TROPONIN I (HIGH SENSITIVITY)   ____________________________________________  EKG  Reviewed entered by me at 1012 Heart rate 80 QRS 140 QTc 500 Sinus rhythm, left bundle branch block.  No evidence of acute ischemic change noted ____________________________________________  RADIOLOGY  Ct Head Wo Contrast  Result Date: 06/21/2019 CLINICAL DATA:  Headache with fatigue/lethargy EXAM: CT HEAD WITHOUT CONTRAST TECHNIQUE: Contiguous axial images were obtained from the base of the skull through the vertex without intravenous contrast. COMPARISON:  May 12, 2018 FINDINGS: Brain: Ventricles and sulci appear within normal limits for age. There is no appreciable intracranial mass, hemorrhage, extra-axial fluid collection, or midline shift. There is a stable prior infarct in the left occipital lobe. Minimal calcification is noted in this area, stable. There is slight small vessel disease  in the centra semiovale bilaterally. No acute infarct is demonstrable on this study. Vascular: There is no hyperdense vessel. There is calcification in each distal vertebral artery and carotid siphon region. Skull: The bony calvarium appears intact. A bony fragment slightly posterior and to the right of the odontoid is stable. Sinuses/Orbits: There is mucosal thickening in several ethmoid air cells. Other visualized paranasal sinuses are clear. Visualized orbits appear symmetric bilaterally. Other: Mastoid air cells are clear. IMPRESSION: Slight small vessel disease in the centra semiovale, stable. Prior small infarct in the left occipital lobe. No acute infarct. No mass or hemorrhage. Multiple foci of arterial vascular calcification evident. There is mucosal thickening in several ethmoid air cells. Stable bony fragment slightly posterior and to the right of the odontoid. Electronically Signed   By: Bretta Bang III M.D.   On: 06/21/2019 11:44   Dg Chest Portable 1 View  Result Date: 06/21/2019 CLINICAL DATA:  Generalized weakness for several hours EXAM: PORTABLE CHEST 1 VIEW COMPARISON:  11/10/2018 FINDINGS: Cardiac shadow is enlarged but stable. Pacing device is again noted. Aortic calcifications are again seen. The lungs are well aerated bilaterally without focal infiltrate or sizable effusion. Diffuse interstitial changes are noted of a chronic nature. Superimposed interstitial edema is noted particularly in the bases. Mild vascular congestion is noted as well. IMPRESSION: Changes consistent with mild CHF superimposed over more chronic interstitial change. Electronically Signed   By: Alcide Clever M.D.   On: 06/21/2019 11:23   Imaging reviewed, x-ray suggestive of mild CHF  ____________________________________________   PROCEDURES  Procedure(s) performed: None  Procedures  Critical Care performed: No  ____________________________________________   INITIAL IMPRESSION / ASSESSMENT AND  PLAN / ED COURSE  Pertinent labs & imaging results that were available during my care of the patient were reviewed by me and considered in my medical decision making (see chart for details).   Patient comes for evaluation of few episodes of feeling fatigued, also felt presyncopal and some shortness of breath and tingling in both hands accompanying these feelings.  She is currently asymptomatic.  Very reassuring neurologic cardiac and pulmonary exam although slightly diminished the right lung base.  Denies any infectious symptoms.  Her BMP and CBC are quite reassuring.  Will check chest x-ray, BNP, CT head as she reports a slight headache associated is well.  EKG shows left bundle, no evidence of acute  arrhythmia.  Clinical Course as of Jun 20 1429  Tue Jun 21, 2019  1428 Patient reports she is feeling better.  Comfortable with plan for discharge.   [MQ]    Clinical Course User Index [MQ] Sharyn CreamerQuale, Nailyn Dearinger, MD   ----------------------------------------- 1:22 PM on 06/21/2019 -----------------------------------------  Normal patient x-ray reviewed, BNP is elevated.  Given her history of CHF as well as in setting of her presentation with intermittent shortness of breath, patient further interview and she reports she is actually knows she is gained about 2 to 3 pounds in the last couple of days unexpectedly.  She does appear likely volume overloaded, has lymphedema in lower extremities.  She is hemodynamically stable except somewhat hypertensive but Dr. Lady GaryFath discussed with me.  He knows the patient well, he recommends that he would not pursue aggressive blood pressure management but would recommend oral Lasix as the patient has at home already for the next 3 days.  He will be able to see the patient for follow-up in the clinic tomorrow or Thursday.  I discussed the plan of care with both the patient and her daughter, they are both comfortable with this plan.  She has Lasix 20 mg tablets at home, will  start taking once daily and follow-up with Dr. Lady GaryFath via his clinic either tomorrow or Thursday.  Return precautions and treatment recommendations and follow-up discussed with the patient who is agreeable with the plan.  Vitals:   06/21/19 1300 06/21/19 1330  BP: (!) 168/97 (!) 161/95  Pulse:  80  Resp: 18 18  Temp:    SpO2:  93%     Patient diuresing well.  Reports she is urinated several times.  She feels better and comfortable with the plan for discharge to see cardiology tomorrow for follow-up.  Return precautions and treatment recommendations and follow-up discussed with the patient who is agreeable with the plan.   ____________________________________________   FINAL CLINICAL IMPRESSION(S) / ED DIAGNOSES  Final diagnoses:  Acute on chronic congestive heart failure, unspecified heart failure type [facility](HCC)        Note:  This document was prepared using Dragon voice recognition software and may include unintentional dictation errors       Sharyn CreamerQuale, Akyia Borelli, MD 06/21/19 1430

## 2019-06-21 NOTE — ED Notes (Signed)
[phone]- daughter Almyra Free- call when pt is ready to be DC

## 2019-06-21 NOTE — ED Notes (Signed)
Pt ambulated to the toilet, NAD noted. Minimal assist needed.

## 2019-06-21 NOTE — ED Notes (Signed)
Per EDP, cancel D-Dimer. Called lab to inform them.

## 2019-06-21 NOTE — ED Notes (Signed)
Family at bedside. 

## 2019-06-21 NOTE — ED Notes (Signed)
Pt returned from CT °

## 2019-06-21 NOTE — Discharge Instructions (Addendum)
Please resume your home furosemide dose of 20 mg by mouth each morning for the next 3 days.  Follow-up tomorrow or Thursday in the clinic with Dr. Ubaldo Glassing

## 2019-06-21 NOTE — ED Triage Notes (Signed)
Pt presents to ED via EMS for generalized weakness that started at 0500 today.oriented x4. Hx TIA. Denies vision changes.

## 2019-06-22 NOTE — Progress Notes (Signed)
Patient ID: Colleen Carson, female    DOB: Feb 15, 1925, 85 y.o.   MRN: [id number]  HPI   Colleen Carson is a 85 y/o female with a history of arthritis, atrial fibrillation, hyperlipidemia, HTN, lymphedema, shingles, CVA and chronic heart failure.  Reviewed echo report done 01/16/18 which showed an EF of 55-65% along with moderate MR and moderate/severe TR. Reviewed echo report done 02/28/17 which showed an EF of 40-45% along with moderate AS, mild MR and mild/mod TR. PA pressure at 31 mm Hg.   Was in the ED 06/21/2019 due to acute on chronic HF and fatigue where she was treated and released.   She presents today for her follow-up visit with a chief complaint of moderate shortness of breath upon minimal exertion. She describes this as being present for several years. It had gotten worse which prompted an ED visit but has since improved. He has associated fatigue and pedal edema along with this. She denies any difficulty sleeping, abdominal distention, palpitations, chest pain, cough, dizziness or weight gain.   Has been taking her furosemide as needed and hasn't taken it since she was released from the ED.   Past Medical History:  Diagnosis Date  . Arthritis   . Atrial fibrillation (HCC)   . CHF (congestive heart failure) (HCC)   . Dysrhythmia    Afib  . Hypercholesteremia   . Hypertension   . Lymphedema    lower extremities  . S/P total hip arthroplasty 01/25/2015  . Shingles   . Stroke [facility])    noted ischemia on CT scan   Past Surgical History:  Procedure Laterality Date  . JOINT REPLACEMENT  01/23/15   Left THR Dr. Rosita Kea   . PACEMAKER INSERTION  2017  . TOTAL HIP ARTHROPLASTY Left 01/23/2015   Procedure: TOTAL HIP ARTHROPLASTY ANTERIOR APPROACH;  Surgeon: Kennedy Bucker, MD;  Location: [facility] ORS;  Service: Orthopedics;  Laterality: Left;   Family History  Problem Relation Age of Onset  . Hypertension Other    Social History   Tobacco Use  . Smoking status: Never Smoker  . Smokeless  tobacco: Never Used  Substance Use Topics  . Alcohol use: No   Allergies  Allergen Reactions  . Morphine Nausea Only    Hallucinations, nausea, vomitting  . Morphine And Related Nausea And Vomiting   Prior to Admission medications   Medication Sig Start Date End Date Taking? Authorizing Provider  apixaban (ELIQUIS) 5 MG TABS tablet Take 5 mg by mouth 2 (two) times daily.   Yes [provider]  atorvastatin (LIPITOR) 40 MG tablet Take 1 tablet (40 mg total) by mouth at bedtime. 01/16/18  Yes Mody, Patricia Pesa, MD  Cetirizine HCl 10 MG CAPS Take 10 mg by mouth at bedtime.   Yes [provider]  furosemide (LASIX) 20 MG tablet Take 10 mg by mouth as needed.   Yes [provider]  metoprolol tartrate (LOPRESSOR) 50 MG tablet Take 50 mg by mouth 2 (two) times daily.   Yes [provider]  Multiple Vitamins-Minerals (CENTRUM SILVER 50+WOMEN PO) Take 1 tablet by mouth daily.   Yes [provider]     Review of Systems  Constitutional: Positive for fatigue. Negative for appetite change.  HENT: Negative for congestion, postnasal drip and sore throat.   Eyes: Negative.   Respiratory: Positive for shortness of breath (with minimal exertion). Negative for cough and chest tightness.   Cardiovascular: Positive for leg swelling (R>L). Negative for chest pain and palpitations.  Gastrointestinal: Negative for abdominal distention and abdominal pain.  Endocrine: Negative.   Genitourinary: Negative.   Musculoskeletal: Negative for back pain and neck pain.  Skin: Negative.   Allergic/Immunologic: Negative.   Neurological: Negative for weakness and light-headedness.  Hematological: Negative for adenopathy. Does not bruise/bleed easily.  Psychiatric/Behavioral: Negative for dysphoric mood and sleep disturbance (sleeping on 1 pillow). The patient is nervous/anxious.    Vitals:   06/23/19 1418  BP: 137/90  Pulse: 83  Resp: 18  SpO2: 98%  Weight: 136 lb 8 oz  (61.9 kg)  Height: 5\' 1"  (1.549 m)   Wt Readings from Last 3 Encounters:  06/23/19 136 lb 8 oz (61.9 kg)  06/21/19 135 lb (61.2 kg)  11/10/18 137 lb (62.1 kg)   Lab Results  Component Value Date   CREATININE 0.89 06/21/2019   CREATININE 0.91 11/10/2018   CREATININE 0.95 08/04/2018    Physical Exam  Constitutional: She is oriented to person, place, and time. She appears well-developed and well-nourished.  HENT:  Head: Normocephalic and atraumatic.  Neck: Normal range of motion. Neck supple. No JVD present.  Cardiovascular: Normal rate and regular rhythm.  Pulmonary/Chest: Effort normal. She has no wheezes. She has no rales.  Abdominal: Soft. She exhibits no distension. There is no abdominal tenderness.  Musculoskeletal:        General: Edema (trace pitting) present. No tenderness.  Neurological: She is alert and oriented to person, place, and time.  Skin: Skin is warm and dry.  Psychiatric: Her behavior is normal. Thought content normal. Her mood appears anxious.  Nursing note and vitals reviewed.   Assessment & Plan:  1: Chronic heart failure with preserved ejection fraction- - NYHA class III - euvolemic today - weighing daily; reminded to call for an overnight weight gain of >2 pounds or a weekly weight gain of >5 pounds - weight stable from her last time here August 2019 - not adding salt to her food and her daughter is reading food labels and cooking foods from scratch. Reviewed the importance of closely following a 2000mg  sodium diet - encouraged her to drink 40-50 ounces of fluid daily - saw cardiologist Ubaldo Glassing) 05/05/2019 & returns later today - BNP 06/21/2019 was 334.0 - has received her flu and shingles vaccine  2: HTN- - BP looks good today - had telemedicine visit with PCP Baldemar Lenis) 01/14/2019 - BMP from 06/21/2019 reviewed and showed sodium 139, potassium 4.1, creatinine 0.89 and GFR 56  3: Lymphedema- - stage 2 - edema has returned - has worn compression  socks in the past but says that they hurt her legs - does elevate her legs when she's sitting for long periods of time - does have compression boots that she has worn in the past and she says that she'll try wearing them again for short periods - she will begin taking furosemide 10mg  on Mon, Wed & Fridays  Patient did not bring her medications nor a list. Each medication was verbally reviewed with the patient and she was encouraged to bring the bottles to every visit to confirm accuracy of list.  Patient and daughter opt to not make a return appointment at this time. Advised patient that she could call back at anytime to make another appointment.

## 2019-06-23 ENCOUNTER — Ambulatory Visit: Payer: Medicare Other | Attending: Family | Admitting: Family

## 2019-06-23 ENCOUNTER — Encounter: Payer: Self-pay | Admitting: Family

## 2019-06-23 ENCOUNTER — Other Ambulatory Visit: Payer: Self-pay

## 2019-06-23 VITALS — BP 137/90 | HR 83 | Resp 18 | Ht 61.0 in | Wt 136.5 lb

## 2019-06-23 DIAGNOSIS — Z79899 Other long term (current) drug therapy: Secondary | ICD-10-CM | POA: Diagnosis not present

## 2019-06-23 DIAGNOSIS — Z885 Allergy status to narcotic agent status: Secondary | ICD-10-CM | POA: Diagnosis not present

## 2019-06-23 DIAGNOSIS — I5032 Chronic diastolic (congestive) heart failure: Secondary | ICD-10-CM | POA: Diagnosis present

## 2019-06-23 DIAGNOSIS — I4891 Unspecified atrial fibrillation: Secondary | ICD-10-CM | POA: Insufficient documentation

## 2019-06-23 DIAGNOSIS — E7800 Pure hypercholesterolemia, unspecified: Secondary | ICD-10-CM | POA: Insufficient documentation

## 2019-06-23 DIAGNOSIS — I890 Lymphedema, not elsewhere classified: Secondary | ICD-10-CM

## 2019-06-23 DIAGNOSIS — E785 Hyperlipidemia, unspecified: Secondary | ICD-10-CM | POA: Diagnosis not present

## 2019-06-23 DIAGNOSIS — I10 Essential (primary) hypertension: Secondary | ICD-10-CM

## 2019-06-23 DIAGNOSIS — Z8249 Family history of ischemic heart disease and other diseases of the circulatory system: Secondary | ICD-10-CM | POA: Insufficient documentation

## 2019-06-23 DIAGNOSIS — Z8673 Personal history of transient ischemic attack (TIA), and cerebral infarction without residual deficits: Secondary | ICD-10-CM | POA: Diagnosis not present

## 2019-06-23 DIAGNOSIS — I110 Hypertensive heart disease with heart failure: Secondary | ICD-10-CM | POA: Insufficient documentation

## 2019-06-23 DIAGNOSIS — Z7901 Long term (current) use of anticoagulants: Secondary | ICD-10-CM | POA: Insufficient documentation

## 2019-06-23 DIAGNOSIS — Z950 Presence of cardiac pacemaker: Secondary | ICD-10-CM | POA: Insufficient documentation

## 2019-06-23 NOTE — Patient Instructions (Addendum)
Continue weighing daily and call for an overnight weight gain of > 2 pounds or a weekly weight gain of >5 pounds.  Take the furosemide 1/2 tablet on Monday, Wednesday & Fridays

## 2019-07-01 ENCOUNTER — Telehealth: Payer: Self-pay | Admitting: Family

## 2019-07-01 NOTE — Telephone Encounter (Signed)
Patient called to say that she has gained 5 pounds over the last week. She says that her weight was 133 pounds and today she is up to 138 pounds.   She is currently taking 10mg  furosemide on Monday, Wednesday and Friday's. Denies any worsening edema or change in her respiratory status.   Instructed her to increase her furosemide to 20mg  daily for the next 4 days and will see patient in the HF Clinic on 07/06/2019 and will check lab work at that time. Patient verbalized understanding.

## 2019-07-06 ENCOUNTER — Ambulatory Visit: Payer: Medicare Other | Attending: Family | Admitting: Family

## 2019-07-06 ENCOUNTER — Encounter: Payer: Self-pay | Admitting: Family

## 2019-07-06 ENCOUNTER — Other Ambulatory Visit: Payer: Self-pay

## 2019-07-06 VITALS — BP 132/82 | HR 90 | Resp 16 | Ht 61.0 in | Wt 139.3 lb

## 2019-07-06 DIAGNOSIS — Z8673 Personal history of transient ischemic attack (TIA), and cerebral infarction without residual deficits: Secondary | ICD-10-CM | POA: Insufficient documentation

## 2019-07-06 DIAGNOSIS — Z950 Presence of cardiac pacemaker: Secondary | ICD-10-CM | POA: Insufficient documentation

## 2019-07-06 DIAGNOSIS — I890 Lymphedema, not elsewhere classified: Secondary | ICD-10-CM | POA: Insufficient documentation

## 2019-07-06 DIAGNOSIS — I4891 Unspecified atrial fibrillation: Secondary | ICD-10-CM | POA: Insufficient documentation

## 2019-07-06 DIAGNOSIS — I110 Hypertensive heart disease with heart failure: Secondary | ICD-10-CM | POA: Insufficient documentation

## 2019-07-06 DIAGNOSIS — Z885 Allergy status to narcotic agent status: Secondary | ICD-10-CM | POA: Diagnosis not present

## 2019-07-06 DIAGNOSIS — E7800 Pure hypercholesterolemia, unspecified: Secondary | ICD-10-CM | POA: Diagnosis not present

## 2019-07-06 DIAGNOSIS — Z79899 Other long term (current) drug therapy: Secondary | ICD-10-CM | POA: Diagnosis not present

## 2019-07-06 DIAGNOSIS — I5032 Chronic diastolic (congestive) heart failure: Secondary | ICD-10-CM | POA: Insufficient documentation

## 2019-07-06 DIAGNOSIS — E785 Hyperlipidemia, unspecified: Secondary | ICD-10-CM | POA: Insufficient documentation

## 2019-07-06 DIAGNOSIS — I10 Essential (primary) hypertension: Secondary | ICD-10-CM

## 2019-07-06 DIAGNOSIS — Z8249 Family history of ischemic heart disease and other diseases of the circulatory system: Secondary | ICD-10-CM | POA: Insufficient documentation

## 2019-07-06 DIAGNOSIS — Z7901 Long term (current) use of anticoagulants: Secondary | ICD-10-CM | POA: Insufficient documentation

## 2019-07-06 LAB — BASIC METABOLIC PANEL
Anion gap: 11 (ref 5–15)
BUN: 24 mg/dL — ABNORMAL HIGH (ref 8–23)
CO2: 29 mmol/L (ref 22–32)
Calcium: 9.8 mg/dL (ref 8.9–10.3)
Chloride: 101 mmol/L (ref 98–111)
Creatinine, Ser: 0.96 mg/dL (ref 0.44–1.00)
GFR calc Af Amer: 60 mL/min — ABNORMAL LOW (ref >60)
GFR calc non Af Amer: 51 mL/min — ABNORMAL LOW (ref >60)
Glucose, Bld: 116 mg/dL — ABNORMAL HIGH (ref 70–99)
Potassium: 3.9 mmol/L (ref 3.5–5.1)
Sodium: 141 mmol/L (ref 135–145)

## 2019-07-06 NOTE — Progress Notes (Signed)
Patient ID: Colleen Carson, female    DOB: March 19, 1925, 85 y.o.   MRN: [id number]  HPI   Colleen Carson is a 85 y/o female with a history of arthritis, atrial fibrillation, hyperlipidemia, HTN, lymphedema, shingles, CVA and chronic heart failure.  Reviewed echo report done 01/16/18 which showed an EF of 55-65% along with moderate MR and moderate/severe TR. Reviewed echo report done 02/28/17 which showed an EF of 40-45% along with moderate AS, mild MR and mild/mod TR. PA pressure at 31 mm Hg.   Was in the ED 06/21/2019 due to acute on chronic HF and fatigue where she was treated and released.   She presents today for her follow-up visit with a chief complaint of minimal shortness of breath on moderate exertion and leg swelling. She was taking lasix 10mg  Monday, Wednesday, and Fridays, however noted that her weight increased by 1 pound several days in a row. She call our office 07/01/2019 and was instructed to take 20mg  lasix daily for 4 days. She did this for 3 days and started feeling light-headed at times. She then took 10mg  daily since 07/04/2019 and has been doing well on this dose. When she was feeling light-headed, she took an over the counter potassium pill of 200mg . She states her weight has now stabilized, her breathing is doing better, and her leg swelling is down. She denies fatigue, cough, chest pain, palpitations, abdominal distention, and trouble sleeping. She weighs herself every day, does not add salt to foods, and drinks between 40-48 ounces of water every day. She has lymphapress boots at home but does not like to wear them. She has been walking outside to the mailbox and doing some yardwork and chores around the house.   Past Medical History:  Diagnosis Date  . Arthritis   . Atrial fibrillation (HCC)   . CHF (congestive heart failure) (HCC)   . Dysrhythmia    Afib  . Hypercholesteremia   . Hypertension   . Lymphedema    lower extremities  . S/P total hip arthroplasty 01/25/2015  .  Shingles   . Stroke [facility])    noted ischemia on CT scan   Past Surgical History:  Procedure Laterality Date  . JOINT REPLACEMENT  01/23/15   Left THR Dr.   . PACEMAKER INSERTION  2017  . TOTAL HIP ARTHROPLASTY Left 01/23/2015   Procedure: TOTAL HIP ARTHROPLASTY ANTERIOR APPROACH;  Surgeon: 03/25/15, MD;  Location: [facility] ORS;  Service: Orthopedics;  Laterality: Left;   Family History  Problem Relation Age of Onset  . Hypertension Other    Social History   Tobacco Use  . Smoking status: Never Smoker  . Smokeless tobacco: Never Used  Substance Use Topics  . Alcohol use: No   Allergies  Allergen Reactions  . Morphine Nausea Only    Hallucinations, nausea, vomitting  . Morphine And Related Nausea And Vomiting   Prior to Admission medications   Medication Sig Start Date End Date Taking? Authorizing Provider  apixaban (ELIQUIS) 5 MG TABS tablet Take 5 mg by mouth 2 (two) times daily.    [provider]  atorvastatin (LIPITOR) 40 MG tablet Take 1 tablet (40 mg total) by mouth at bedtime. 01/16/18   2018, MD  Cetirizine HCl 10 MG CAPS Take 10 mg by mouth at bedtime.    [provider]  furosemide (LASIX) 20 MG tablet Take 10 mg by mouth as needed.    [provider]  metoprolol tartrate (LOPRESSOR) 50  MG tablet Take 50 mg by mouth 2 (two) times daily.    [provider]  Multiple Vitamins-Minerals (CENTRUM SILVER 50+WOMEN PO) Take 1 tablet by mouth daily.    [provider]  Potassium OTC 200mg  PO as needed  Review of Systems  Constitutional: Negative for appetite change and fatigue.  HENT: Negative for congestion, postnasal drip and sore throat.   Eyes: Negative.   Respiratory: Positive for shortness of breath (with moderate exertion). Negative for cough and chest tightness.   Cardiovascular: Positive for leg swelling (as the days go on R>L). Negative for chest pain and palpitations.  Gastrointestinal: Negative for abdominal  distention and abdominal pain.  Endocrine: Negative.   Genitourinary: Negative.   Musculoskeletal: Negative for back pain and neck pain.  Skin: Negative.   Allergic/Immunologic: Negative.   Neurological: Negative for weakness and light-headedness.  Hematological: Negative for adenopathy. Does not bruise/bleed easily.  Psychiatric/Behavioral: Negative for dysphoric mood and sleep disturbance (sleeping on 1 pillow). The patient is nervous/anxious.    Vitals:   07/06/19 1341  BP: 132/82  Pulse: 90  Resp: 16  SpO2: 97%   Filed Weights   07/06/19 1341  Weight: 139 lb 4.8 oz (63.2 kg)   Lab Results  Component Value Date   CREATININE 0.89 06/21/2019   CREATININE 0.91 11/10/2018   CREATININE 0.95 08/04/2018    Physical Exam  Constitutional: She is oriented to person, place, and time. She appears well-developed and well-nourished.  HENT:  Head: Normocephalic and atraumatic.  Neck: Normal range of motion. Neck supple. No JVD present.  Cardiovascular: Normal rate and regular rhythm.  Pulmonary/Chest: Effort normal. She has no wheezes. She has no rales.  Abdominal: Soft. She exhibits no distension. There is no abdominal tenderness.  Musculoskeletal:        General: Edema (non-pitting R>L) present. No tenderness.  Neurological: She is alert and oriented to person, place, and time.  Skin: Skin is warm and dry.  Psychiatric: She has a normal mood and affect. Her behavior is normal. Thought content normal.  Nursing note and vitals reviewed.   Assessment & Plan:  1: Chronic heart failure with preserved ejection fraction- - NYHA class II - euvolemic today - weighing daily; reminded to call for an overnight weight gain of >2 pounds or a weekly weight gain of >5 pounds - weight up 3 pounds from last visit 2 weeks ago - continue taking lasix 10mg  daily. She will call when she needs a new prescription sent to the pharmacy - check BMP today - not adding salt to her food and her daughter  is reading food labels and cooking foods from scratch. Reviewed the importance of closely following a 2000mg  sodium diet - encouraged her to drink 40-50 ounces of fluid daily - saw cardiologist Ubaldo Glassing) 06/23/2019 - BNP 06/21/2019 was 334.0 - has received her flu vaccine  2: HTN- - BP looks good today - had telemedicine visit with PCP Baldemar Lenis) 01/14/2019 - BMP from 06/21/2019 reviewed and showed sodium 139, potassium 4.1, creatinine 0.89 and GFR 56  3: Lymphedema- - stage 2 - edema has returned - has worn compression socks in the past but says that they hurt her legs - does elevate her legs when she's sitting for long periods of time - does have compression boots that she has worn in the past but she does not like to wear them  Patient did not bring her medications nor a list. Each medication was verbally reviewed with the patient and  she was encouraged to bring the bottles to every visit to confirm accuracy of list.  Follow-up in 3 months or sooner for any questions/problems.

## 2019-07-06 NOTE — Patient Instructions (Addendum)
Continue weighing daily and call for an overnight weight gain of >2 pounds or a weekly weight gain of >5 pounds.  Continue taking lasix 10mg  every day  Will check BMP today to check potassium and kidney function

## 2019-07-07 ENCOUNTER — Encounter: Payer: Self-pay | Admitting: Family

## 2019-07-26 ENCOUNTER — Telehealth: Payer: Self-pay | Admitting: Family

## 2019-07-26 NOTE — Telephone Encounter (Signed)
Received phone call from patient regarding a possible side effect from her furosemide. She says that in the past she remembers that she couldn't tolerate a water pill and she thinks she's having an issue again. She says that she's taking her furosemide 10mg  daily and has developed a deep cough which is then causing her back to hurt. She says that when this happened in the past, she developed sciatica.   She says that her weight continues to decline and her swelling is stable. She has cardiology follow-up on 08/08/2019.   Discussed various options such as using the furosemide as needed or changing it to a different one completely. She says that she'd like to try taking the furosemide only if she needs it. Emphasized the importance of weighing daily and taking the furosemide for an overnight weight gain of >2 pounds or a weekly weight gain of >5 pounds or if her swelling/ shortness of breath worsens. Encouraged her to write down when she takes the furosemide so she can keep up with how often she takes it.   Instructed her to call her PCP regarding her cough/ back pain and she says that she will call him. Patient verbalized understanding of the instructions and was comfortable with this plan.

## 2019-07-27 ENCOUNTER — Other Ambulatory Visit: Payer: Self-pay | Admitting: Rehabilitative and Restorative Service Providers"

## 2019-07-27 DIAGNOSIS — I4819 Other persistent atrial fibrillation: Secondary | ICD-10-CM

## 2019-07-27 DIAGNOSIS — I712 Thoracic aortic aneurysm, without rupture: Secondary | ICD-10-CM

## 2019-07-27 DIAGNOSIS — I7121 Aneurysm of the ascending aorta, without rupture: Secondary | ICD-10-CM

## 2019-08-09 ENCOUNTER — Other Ambulatory Visit: Payer: Self-pay

## 2019-08-09 ENCOUNTER — Ambulatory Visit
Admission: RE | Admit: 2019-08-09 | Discharge: 2019-08-09 | Disposition: A | Discharge status: Home / Self Care | Payer: Medicare Other | Source: Ambulatory Visit | Attending: Rehabilitative and Restorative Service Providers" | Admitting: Rehabilitative and Restorative Service Providers"

## 2019-08-09 DIAGNOSIS — I4819 Other persistent atrial fibrillation: Secondary | ICD-10-CM

## 2019-08-09 DIAGNOSIS — I712 Thoracic aortic aneurysm, without rupture: Secondary | ICD-10-CM | POA: Insufficient documentation

## 2019-08-09 DIAGNOSIS — I7121 Aneurysm of the ascending aorta, without rupture: Secondary | ICD-10-CM

## 2019-08-09 MED ORDER — IOHEXOL 350 MG/ML SOLN
75.0000 mL | Freq: Once | INTRAVENOUS | Status: AC | PRN
  Administered 2019-08-09: 75 mL via INTRAVENOUS

## 2019-09-19 ENCOUNTER — Other Ambulatory Visit: Payer: Self-pay | Admitting: Family

## 2019-09-19 MED ORDER — FUROSEMIDE 20 MG PO TABS: 10.0000 mg | ORAL_TABLET | ORAL | 3 refills | Status: DC

## 2019-10-05 ENCOUNTER — Ambulatory Visit: Payer: Medicare Other | Admitting: Family

## 2019-10-12 NOTE — Progress Notes (Signed)
Patient ID: Colleen Carson, female    DOB: 07/23/1925, 86 y.o.   MRN: [id number]  HPI   Colleen Carson is a 86 y/o female with a history of arthritis, atrial fibrillation, hyperlipidemia, HTN, lymphedema, shingles, CVA and chronic heart failure.  Reviewed echo report done 01/16/18 which showed an EF of 55-65% along with moderate MR and moderate/severe TR. Reviewed echo report done 02/28/17 which showed an EF of 40-45% along with moderate AS, mild MR and mild/mod TR. PA pressure at 31 mm Hg.   Was in the ED 06/21/2019 due to acute on chronic HF and fatigue where she was treated and released.   She presents today for her follow-up visit with a chief complaint of minimal shortness of breath upon moderate exertion. She describes this as chronic in nature having been present for several years. She is able to do quite a bit of walking where she lives and does so without difficulty. She has associated pedal edema (due to lymphedema) and anxiety along with this. She denies any dizziness, abdominal distention, palpitations, chest pain, cough, fatigue or weight gain.   She says that she's received her first COVID vaccine and is due to get her second one next week.   Past Medical History:  Diagnosis Date  . Arthritis   . Atrial fibrillation (HCC)   . CHF (congestive heart failure) (HCC)   . Dysrhythmia    Afib  . Hypercholesteremia   . Hypertension   . Lymphedema    lower extremities  . S/P total hip arthroplasty 01/25/2015  . Shingles   . Stroke [facility])    noted ischemia on CT scan   Past Surgical History:  Procedure Laterality Date  . JOINT REPLACEMENT  01/23/15   Left THR Dr. Rosita Kea   . PACEMAKER INSERTION  2017  . TOTAL HIP ARTHROPLASTY Left 01/23/2015   Procedure: TOTAL HIP ARTHROPLASTY ANTERIOR APPROACH;  Surgeon: Kennedy Bucker, MD;  Location: [facility] ORS;  Service: Orthopedics;  Laterality: Left;   Family History  Problem Relation Age of Onset  . Hypertension Other    Social History   Tobacco Use   . Smoking status: Never Smoker  . Smokeless tobacco: Never Used  Substance Use Topics  . Alcohol use: No   Allergies  Allergen Reactions  . Morphine Nausea Only    Hallucinations, nausea, vomitting  . Morphine And Related Nausea And Vomiting   Prior to Admission medications   Medication Sig Start Date End Date Taking? Authorizing Provider  apixaban (ELIQUIS) 5 MG TABS tablet Take 5 mg by mouth 2 (two) times daily.   Yes [provider]  atorvastatin (LIPITOR) 40 MG tablet Take 1 tablet (40 mg total) by mouth at bedtime. 01/16/18  Yes Mody, Patricia Pesa, MD  Cetirizine HCl 10 MG CAPS Take 10 mg by mouth at bedtime.   Yes [provider]  furosemide (LASIX) 20 MG tablet Take 0.5 tablets (10 mg total) by mouth every other day. Patient taking differently: Take 10 mg by mouth daily.  09/19/19  Yes Clarisa Kindred A, FNP  metoprolol tartrate (LOPRESSOR) 50 MG tablet Take 50 mg by mouth 2 (two) times daily.   Yes [provider]  Multiple Vitamins-Minerals (CENTRUM SILVER 50+WOMEN PO) Take 1 tablet by mouth daily.   Yes [provider]     Review of Systems  Constitutional: Negative for appetite change and fatigue.  HENT: Negative for congestion, postnasal drip and sore throat.   Eyes: Negative.   Respiratory: Positive for  shortness of breath (with moderate exertion). Negative for cough and chest tightness.   Cardiovascular: Positive for leg swelling (as the days go on R>L). Negative for chest pain and palpitations.  Gastrointestinal: Negative for abdominal distention and abdominal pain.  Endocrine: Negative.   Genitourinary: Negative.   Musculoskeletal: Negative for back pain and neck pain.  Skin: Negative.   Allergic/Immunologic: Negative.   Neurological: Negative for weakness and light-headedness.  Hematological: Negative for adenopathy. Does not bruise/bleed easily.  Psychiatric/Behavioral: Negative for dysphoric mood and sleep disturbance (sleeping on 1  pillow). The patient is nervous/anxious.    Vitals:   10/13/19 0938  BP: 133/68  Pulse: 80  Resp: 16  SpO2: 97%  Weight: 142 lb (64.4 kg)  Height: 5\' 1"  (1.549 m)   Wt Readings from Last 3 Encounters:  10/13/19 142 lb (64.4 kg)  07/06/19 139 lb 4.8 oz (63.2 kg)  06/23/19 136 lb 8 oz (61.9 kg)   Lab Results  Component Value Date   CREATININE 0.96 07/06/2019   CREATININE 0.89 06/21/2019   CREATININE 0.91 11/10/2018     Physical Exam  Constitutional: She is oriented to person, place, and time. She appears well-developed and well-nourished.  HENT:  Head: Normocephalic and atraumatic.  Neck: No JVD present.  Cardiovascular: Normal rate and regular rhythm.  Pulmonary/Chest: Effort normal. She has no wheezes. She has no rales.  Abdominal: Soft. She exhibits no distension. There is no abdominal tenderness.  Musculoskeletal:        General: Edema (non-pitting R>L) present. No tenderness.     Cervical back: Normal range of motion and neck supple.  Neurological: She is alert and oriented to person, place, and time.  Skin: Skin is warm and dry.  Psychiatric: She has a normal mood and affect. Her behavior is normal. Thought content normal.  Nursing note and vitals reviewed.   Assessment & Plan:  1: Chronic heart failure with preserved ejection fraction- - NYHA class II - euvolemic today - weighing daily; reminded to call for an overnight weight gain of >2 pounds or a weekly weight gain of >5 pounds - weight up 3 pounds from last visit 3 months ago - continue taking lasix 10mg  daily.  - not adding salt to her food and her daughter is reading food labels and cooking foods from scratch. Reviewed the importance of closely following a 2000mg  sodium diet - encouraged her to drink 40-50 ounces of fluid daily - saw cardiologist Ubaldo Glassing) 08/08/2019 - BNP 06/21/2019 was 334.0 - has received her flu vaccine  2: HTN- - BP looks good today - saw PCP Baldemar Lenis) 09/20/2019 - BMP from  07/06/2019 reviewed and showed sodium 141, potassium 3.9, creatinine 0.96 and GFR 51  3: Lymphedema- - stage 2 - does exercise by doing quite a bit of walking - has worn compression socks in the past but says that they hurt her legs - does elevate her legs when she's sitting for long periods of time with improvement in her edema - does have compression boots that she has worn in the past but she does not like to wear them  Patient did not bring her medications nor a list. Each medication was verbally reviewed with the patient and she was encouraged to bring the bottles to every visit to confirm accuracy of list.  Return in 6 months or sooner for any questions/problems before then.

## 2019-10-13 ENCOUNTER — Other Ambulatory Visit: Payer: Self-pay

## 2019-10-13 ENCOUNTER — Encounter: Payer: Self-pay | Admitting: Family

## 2019-10-13 ENCOUNTER — Ambulatory Visit: Payer: Medicare Other | Attending: Family | Admitting: Family

## 2019-10-13 VITALS — BP 133/68 | HR 80 | Resp 16 | Ht 61.0 in | Wt 142.0 lb

## 2019-10-13 DIAGNOSIS — Z950 Presence of cardiac pacemaker: Secondary | ICD-10-CM | POA: Diagnosis not present

## 2019-10-13 DIAGNOSIS — Z79899 Other long term (current) drug therapy: Secondary | ICD-10-CM | POA: Diagnosis not present

## 2019-10-13 DIAGNOSIS — I110 Hypertensive heart disease with heart failure: Secondary | ICD-10-CM | POA: Diagnosis not present

## 2019-10-13 DIAGNOSIS — R0602 Shortness of breath: Secondary | ICD-10-CM | POA: Diagnosis present

## 2019-10-13 DIAGNOSIS — I5032 Chronic diastolic (congestive) heart failure: Secondary | ICD-10-CM

## 2019-10-13 DIAGNOSIS — Z7901 Long term (current) use of anticoagulants: Secondary | ICD-10-CM | POA: Insufficient documentation

## 2019-10-13 DIAGNOSIS — E7800 Pure hypercholesterolemia, unspecified: Secondary | ICD-10-CM | POA: Insufficient documentation

## 2019-10-13 DIAGNOSIS — Z8673 Personal history of transient ischemic attack (TIA), and cerebral infarction without residual deficits: Secondary | ICD-10-CM | POA: Insufficient documentation

## 2019-10-13 DIAGNOSIS — E785 Hyperlipidemia, unspecified: Secondary | ICD-10-CM | POA: Insufficient documentation

## 2019-10-13 DIAGNOSIS — I890 Lymphedema, not elsewhere classified: Secondary | ICD-10-CM

## 2019-10-13 DIAGNOSIS — I10 Essential (primary) hypertension: Secondary | ICD-10-CM

## 2019-10-13 DIAGNOSIS — M1990 Unspecified osteoarthritis, unspecified site: Secondary | ICD-10-CM | POA: Insufficient documentation

## 2019-10-13 NOTE — Patient Instructions (Signed)
Continue weighing daily and call for an overnight weight gain of > 2 pounds or a weekly weight gain of >5 pounds. 

## 2019-11-22 ENCOUNTER — Other Ambulatory Visit: Payer: Self-pay | Admitting: Family

## 2019-11-22 MED ORDER — FUROSEMIDE 20 MG PO TABS: 20.0000 mg | ORAL_TABLET | Freq: Every day | ORAL | 3 refills | Status: DC

## 2019-11-28 ENCOUNTER — Other Ambulatory Visit: Payer: Self-pay

## 2019-11-28 ENCOUNTER — Emergency Department: Payer: Medicare Other

## 2019-11-28 ENCOUNTER — Inpatient Hospital Stay
Admission: EM | Admit: 2019-11-28 | Discharge: 2019-11-30 | DRG: 293 | Disposition: A | Discharge status: Home Health | Payer: Medicare Other | Attending: Internal Medicine | Admitting: Internal Medicine

## 2019-11-28 DIAGNOSIS — Z7901 Long term (current) use of anticoagulants: Secondary | ICD-10-CM

## 2019-11-28 DIAGNOSIS — Z96642 Presence of left artificial hip joint: Secondary | ICD-10-CM | POA: Diagnosis present

## 2019-11-28 DIAGNOSIS — I5033 Acute on chronic diastolic (congestive) heart failure: Secondary | ICD-10-CM

## 2019-11-28 DIAGNOSIS — E785 Hyperlipidemia, unspecified: Secondary | ICD-10-CM | POA: Diagnosis present

## 2019-11-28 DIAGNOSIS — Z66 Do not resuscitate: Secondary | ICD-10-CM | POA: Diagnosis present

## 2019-11-28 DIAGNOSIS — I5043 Acute on chronic combined systolic (congestive) and diastolic (congestive) heart failure: Secondary | ICD-10-CM

## 2019-11-28 DIAGNOSIS — Z20822 Contact with and (suspected) exposure to covid-19: Secondary | ICD-10-CM | POA: Diagnosis present

## 2019-11-28 DIAGNOSIS — I110 Hypertensive heart disease with heart failure: Principal | ICD-10-CM | POA: Diagnosis present

## 2019-11-28 DIAGNOSIS — M1990 Unspecified osteoarthritis, unspecified site: Secondary | ICD-10-CM | POA: Diagnosis present

## 2019-11-28 DIAGNOSIS — I10 Essential (primary) hypertension: Secondary | ICD-10-CM | POA: Diagnosis present

## 2019-11-28 DIAGNOSIS — Z950 Presence of cardiac pacemaker: Secondary | ICD-10-CM

## 2019-11-28 DIAGNOSIS — Z8673 Personal history of transient ischemic attack (TIA), and cerebral infarction without residual deficits: Secondary | ICD-10-CM

## 2019-11-28 DIAGNOSIS — G459 Transient cerebral ischemic attack, unspecified: Secondary | ICD-10-CM | POA: Diagnosis present

## 2019-11-28 DIAGNOSIS — E7800 Pure hypercholesterolemia, unspecified: Secondary | ICD-10-CM | POA: Diagnosis present

## 2019-11-28 DIAGNOSIS — I700 Atherosclerosis of aorta: Secondary | ICD-10-CM | POA: Diagnosis present

## 2019-11-28 DIAGNOSIS — I4891 Unspecified atrial fibrillation: Secondary | ICD-10-CM | POA: Diagnosis present

## 2019-11-28 LAB — BASIC METABOLIC PANEL
Anion gap: 10 (ref 5–15)
BUN: 27 mg/dL — ABNORMAL HIGH (ref 8–23)
CO2: 28 mmol/L (ref 22–32)
Calcium: 9.5 mg/dL (ref 8.9–10.3)
Chloride: 101 mmol/L (ref 98–111)
Creatinine, Ser: 1.24 mg/dL — ABNORMAL HIGH (ref 0.44–1.00)
GFR calc Af Amer: 44 mL/min — ABNORMAL LOW (ref >60)
GFR calc non Af Amer: 38 mL/min — ABNORMAL LOW (ref >60)
Glucose, Bld: 154 mg/dL — ABNORMAL HIGH (ref 70–99)
Potassium: 4.4 mmol/L (ref 3.5–5.1)
Sodium: 139 mmol/L (ref 135–145)

## 2019-11-28 LAB — HEPATIC FUNCTION PANEL
ALT: 25 U/L (ref 0–44)
AST: 49 U/L — ABNORMAL HIGH (ref 15–41)
Albumin: 4.2 g/dL (ref 3.5–5.0)
Alkaline Phosphatase: 235 U/L — ABNORMAL HIGH (ref 38–126)
Bilirubin, Direct: 0.3 mg/dL — ABNORMAL HIGH (ref 0.0–0.2)
Indirect Bilirubin: 0.5 mg/dL (ref 0.3–0.9)
Total Bilirubin: 0.8 mg/dL (ref 0.3–1.2)
Total Protein: 7 g/dL (ref 6.5–8.1)

## 2019-11-28 LAB — CBC
HCT: 41.5 % (ref 36.0–46.0)
Hemoglobin: 13.2 g/dL (ref 12.0–15.0)
MCH: 29.9 pg (ref 26.0–34.0)
MCHC: 31.8 g/dL (ref 30.0–36.0)
MCV: 94.1 fL (ref 80.0–100.0)
Platelets: 142 10*3/uL — ABNORMAL LOW (ref 150–400)
RBC: 4.41 MIL/uL (ref 3.87–5.11)
RDW: 15.7 % — ABNORMAL HIGH (ref 11.5–15.5)
WBC: 4.8 10*3/uL (ref 4.0–10.5)
nRBC: 0 % (ref 0.0–0.2)

## 2019-11-28 LAB — PROTIME-INR
INR: 1.2 (ref 0.8–1.2)
Prothrombin Time: 15.2 seconds (ref 11.4–15.2)

## 2019-11-28 LAB — BRAIN NATRIURETIC PEPTIDE: B Natriuretic Peptide: 461 pg/mL — ABNORMAL HIGH (ref 0.0–100.0)

## 2019-11-28 LAB — TROPONIN I (HIGH SENSITIVITY): Troponin I (High Sensitivity): 14 ng/L (ref <18)

## 2019-11-28 MED ORDER — FUROSEMIDE 10 MG/ML IJ SOLN
40.0000 mg | Freq: Once | INTRAMUSCULAR | Status: AC
  Administered 2019-11-28: 40 mg via INTRAVENOUS
  Filled 2019-11-28: qty 4

## 2019-11-28 NOTE — H&P (Signed)
History and Physical    Colleen Carson HYI:[id number] DOB: Jul 11, 1925 DOA: 11/28/2019  PCP: Kandyce Rud, MD  Patient coming from: Home with her daughter and son-in-law  Chief Complaint: SOB  HPI: Colleen Carson is a 86 y.o. fairly functional female with a pertinent history of chronic diastolic HF (EF2019 55% EF w/ moderate MR), atrial fibrillation s/p pacemaker, HTN, HLD, who presents to the ED with shortness of breath.  Patient has gained about 10 pounds in the last week and a half she is not really sure why.  She is pretty good about fluid and sodium restrictions.  She has had worsening edema, lymphedema  .  She has orthopnea and has become very progressively short of breath.  She is not really sure why this happenned.  Sometimes when Afib acts up it will cause some fluid retention.  She thought that she was told if she gains more than 2 pounds in a day to contact her provider, but she hadn't reached that as she has gained 10 pounds in 1.5 weeks.  She has noticed that she has only been able to walk the drive way about 4 times instead of more. She thinks baseline weight is 133.  On further discussion, patient states she has been eating a lot more fruit including multiple clementines, grapes along with her max of 50 oz of fluids.    Functionally, she lives with her daughter and son in law and tries to help aropund the house.  She walks with the walker outside so she can sit and rest if she needs.    In the ED, patient was hemodynamically stable.  Wbc was 4.8.hgb 13.2, plt 142, Na 139, K 4.4, CO2 28, Scr 1.24 and baseline is usually around 0.9's.   Oxygen was added for comfort of her shortness of breath.  BNP was 461 which is the highest its been in years.   Troponin 14. EKG showed mostly paced rhythm. CXR showed cardiomegaly and aortic atherosclerosis. 40mg  IV lasix was started in the ED.  She states this reached threshold and felt better when I assessed her.  Last TTE was 2019 showing 55-65%  EF.  She does think that perhaps her lasix dose was not reaching as much of a threshold.   Review of Systems: As per HPI otherwise 10 point review of systems negative.  Other pertinents as below:  General - Denies new ha's or visual changes, f/c, denies recent illness HEENT - denies falls or head trauma Cardio - as per hpi Resp - denies cough or URI/LRI symptoms GI - denies n/v/d/GI pain GU - denies urinary changes. Denies frequency MSK - denies new joint or back pain Skin - denies rashes, or new lesions, she thinks that she has been getting bit by chiggers she wonders Neuro - denies new numbness or weakness Psych - denies anxiety/depression   Past Medical History:  Diagnosis Date  . Arthritis   . Atrial fibrillation (HCC)   . CHF (congestive heart failure) (HCC)   . Dysrhythmia    Afib  . Hypercholesteremia   . Hypertension   . Lymphedema    lower extremities  . S/P total hip arthroplasty 01/25/2015  . Shingles   . Stroke [facility])    noted ischemia on CT scan    Past Surgical History:  Procedure Laterality Date  . JOINT REPLACEMENT  01/23/15   Left THR Dr. 03/25/15   . PACEMAKER INSERTION  2017  . TOTAL HIP ARTHROPLASTY Left 01/23/2015  Procedure: TOTAL HIP ARTHROPLASTY ANTERIOR APPROACH;  Surgeon: Hessie Knows, MD;  Location: [facility] ORS;  Service: Orthopedics;  Laterality: Left;     reports that she has never smoked. She has never used smokeless tobacco. She reports that she does not drink alcohol or use drugs.  Allergies  Allergen Reactions  . Morphine Nausea Only    Hallucinations, nausea, vomitting  . Morphine And Related Nausea And Vomiting    Family History  Problem Relation Age of Onset  . Hypertension Other     Prior to Admission medications   Medication Sig Start Date End Date Taking? Authorizing Provider  apixaban (ELIQUIS) 5 MG TABS tablet Take 5 mg by mouth 2 (two) times daily.    [provider]  atorvastatin (LIPITOR) 40 MG tablet Take 1 tablet  (40 mg total) by mouth at bedtime. 01/16/18   Bettey Costa, MD  Cetirizine HCl 10 MG CAPS Take 10 mg by mouth at bedtime.    [provider]  furosemide (LASIX) 20 MG tablet Take 1 tablet (20 mg total) by mouth daily. 11/22/19   Alisa Graff, FNP  metoprolol tartrate (LOPRESSOR) 50 MG tablet Take 50 mg by mouth 2 (two) times daily.    [provider]  Multiple Vitamins-Minerals (CENTRUM SILVER 50+WOMEN PO) Take 1 tablet by mouth daily.    [provider]    Physical Exam: Vitals:   11/29/19 0100 11/29/19 0200 11/29/19 0230 11/29/19 0330  BP: (!) 144/88 (!) 145/80 (!) 143/84 (!) 152/81  Pulse: 63 61 61 61  Resp: 14 15 16 15   Temp:      TempSrc:      SpO2: 97% 99% 99% 98%  Weight:      Height:        Constitutional: NAD, comfortable, pleasant, talkative Eyes: pupils equal and reactive to light, anicteric, without injection ENMT: MMM, throat without exudates or erythema Neck: normal, supple, no masses, no thyromegaly noted Respiratory: nwob, some bibasilar rales Cardiovascular: irregular paced rhythm at times, JVD to her mid ear.   Abdomen: NBS, NT, soft Musculoskeletal: moving all 4 extremities, strength grossly intact 5/5 in the UE and LE's,  Skin: no rashes, lesions, ulcers. No induration Neurologic: CN 2-12 grossly intact. Sensation intact  Psychiatric: AO appearing, Oriented x4.  mentation appropriate  Labs on Admission: I have personally reviewed following labs and imaging studies  CBC: Recent Labs  Lab 11/28/19 1848  WBC 4.8  HGB 13.2  HCT 41.5  MCV 94.1  PLT 720*   Basic Metabolic Panel: Recent Labs  Lab 11/28/19 1848  NA 139  K 4.4  CL 101  CO2 28  GLUCOSE 154*  BUN 27*  CREATININE 1.24*  CALCIUM 9.5   GFR: Estimated Creatinine Clearance: 25.2 mL/min (A) (by C-G formula based on SCr of 1.24 mg/dL (H)). Liver Function Tests: Recent Labs  Lab 11/28/19 1848  AST 49*  ALT 25  ALKPHOS 235*  BILITOT 0.8  PROT 7.0  ALBUMIN  4.2   No results for input(s): LIPASE, AMYLASE in the last 168 hours. No results for input(s): AMMONIA in the last 168 hours. Coagulation Profile: Recent Labs  Lab 11/28/19 1848  INR 1.2   Cardiac Enzymes: No results for input(s): CKTOTAL, CKMB, CKMBINDEX, TROPONINI in the last 168 hours. BNP (last 3 results) No results for input(s): PROBNP in the last 8760 hours. HbA1C: No results for input(s): HGBA1C in the last 72 hours. CBG: No results for input(s): GLUCAP in the last 168 hours. Lipid  Profile: No results for input(s): CHOL, HDL, LDLCALC, TRIG, CHOLHDL, LDLDIRECT in the last 72 hours. Thyroid Function Tests: No results for input(s): TSH, T4TOTAL, FREET4, T3FREE, THYROIDAB in the last 72 hours. Anemia Panel: No results for input(s): VITAMINB12, FOLATE, FERRITIN, TIBC, IRON, RETICCTPCT in the last 72 hours. Urine analysis:    Component Value Date/Time   COLORURINE YELLOW (A) 06/21/2019 1114   APPEARANCEUR CLEAR (A) 06/21/2019 1114   APPEARANCEUR Hazy 01/10/2015 1349   LABSPEC 1.005 06/21/2019 1114   LABSPEC 1.026 01/10/2015 1349   PHURINE 7.0 06/21/2019 1114   GLUCOSEU NEGATIVE 06/21/2019 1114   GLUCOSEU Negative 01/10/2015 1349   HGBUR NEGATIVE 06/21/2019 1114   BILIRUBINUR NEGATIVE 06/21/2019 1114   BILIRUBINUR Negative 01/10/2015 1349   KETONESUR NEGATIVE 06/21/2019 1114   PROTEINUR NEGATIVE 06/21/2019 1114   NITRITE NEGATIVE 06/21/2019 1114   LEUKOCYTESUR NEGATIVE 06/21/2019 1114   LEUKOCYTESUR 1+ 01/10/2015 1349    Radiological Exams on Admission: DG Chest 2 View  Result Date: 11/28/2019 CLINICAL DATA:  87 year old female with shortness of breath for 1 hour. Atrial fibrillation EXAM: CHEST - 2 VIEW COMPARISON:  CTA chest 07/30/2019 and earlier. FINDINGS: Stable cardiomegaly and mediastinal contours. Calcified aortic atherosclerosis. Other mediastinal contours are within normal limits. Stable left chest dual lead cardiac pacemaker. Right lower lobe calcified  granulomas. No pneumothorax, pulmonary edema, pleural effusion or acute pulmonary opacity. No acute osseous abnormality identified. Negative visible bowel gas pattern. Left thyroid surgical clips again noted. IMPRESSION: 1. No acute cardiopulmonary abnormality. 2. Stable cardiomegaly.  Aortic Atherosclerosis (ICD10-I70.0). Electronically Signed   By: Odessa Fleming M.D.   On: 11/28/2019 19:18    EKG: Independently reviewed.paced rhythm, no significant ischemic changes noted  Assessment/Plan Active Problems:   Atrial fibrillation (HCC)   HTN (hypertension)   HLD (hyperlipidemia)   TIA (transient ischemic attack)   Acute on chronic diastolic heart failure (HCC)   ANIQUE BECKLEY is a 86 y.o. functional female with a pertinent history diastolic HF, atrial fibrillation s/p pacemaker, HTN, HLD, who presents to the ED with progressive shortness of breath found to have a HF exacerbation.  Acute on chronic diastolic HF exacerbation, improving with diuresis.  Think most likely cause would be her overeating of fruits that speaks for a gradual progression of weight gain along with maybe her diuretic wasn't reaching threshold as she states she takes half a pill --BNP 461 up from 300's.  Baseline weight said to be around 133. --consider increasing home dose to whole pill and finding appropriate regimen prior to discharge --telemetry --TSH, bmp, mag, goal of K>4, Mg >2 --s/p IV 40mg  lasix with appropriate diuresis, will repeat for 7am in the morning and defer remainder to other providers --repeat high sensitivity troponin  --repeat TTE since has been over a year --reducate that if 3-5 weight gain in a week, then to contact doctor for diuretic titration --Will continue metoprolol tartrate 50mg  BID for now....--consider interrogating pacemaker.   Chronic conditions Afib - takes toprol and has a pacemaker. Cont' eliquis HTN HLD - cont statin H/o TIA - quite a while ago, she thinks three of them over 5 years,  consider adding ASA Anxiety   Patient and/or Family completely agreed with the plan, expressed understanding and I answered all questions.  DVT prophylaxis: Code Status: DNR Left a message with daughter who is the HCPOA Family Communication: Left a message on daughters phone ? Disposition Plan: Likely home with daughter and son-in-law where she resides, unfortunately, her husband passed away  at some point  Consults called: n/a Admission status: Inpatient because it will likely take some time to diurese and find an appropriate home regimen.   A total of 70 minutes utilized during this admission.  Charlane Ferretti DO [facility]   If 7PM-7AM, please contact night-coverage [email or web address] Password [email or web address]  11/29/2019, 4:07 AM

## 2019-11-28 NOTE — ED Provider Notes (Signed)
[facility] Emergency Department Provider Note  ____________________________________________   First MD Initiated Contact with Patient 11/28/19 2156     (approximate)  I have reviewed the triage vital signs and the nursing notes.   HISTORY  Chief Complaint Shortness of Breath    HPI Colleen Carson is a 86 y.o. female with past medical history of A. fib, CHF, stroke, hypertension, here with shortness of breath.  Patient states that for the last day she has had progressively worsening severe Sjogren's and extreme fatigue with exertion.  She admits to almost 10 pound weight gain over the last 2 weeks.  She states that she has gained essentially 1 pound per day for the last several days.  She said associated worsening orthopnea, leg swelling, and now shortness of breath throughout the day today.  She states that normally she is able to walk a fair distance every day, but now cannot even essentially sit up without getting extremely winded and feeling like she is going to pass out.  She has had associated increasing swelling as well as distention in her abdomen.  No overt abdominal pain.  No nausea vomiting.  No diarrhea.  No other complaints.      Past Medical History:  Diagnosis Date  . Arthritis   . Atrial fibrillation (HCC)   . CHF (congestive heart failure) (HCC)   . Dysrhythmia    Afib  . Hypercholesteremia   . Hypertension   . Lymphedema    lower extremities  . S/P total hip arthroplasty 01/25/2015  . Shingles   . Stroke [facility])    noted ischemia on CT scan    Patient Active Problem List   Diagnosis Date Noted  . Chronic diastolic heart failure (HCC) 05/17/2018  . Chronic systolic heart failure (HCC) 03/04/2017  . Anxiety 03/04/2017  . TIA (transient ischemic attack) 12/24/2016  . Acute encephalopathy 10/01/2016  . Symptomatic bradycardia 05/15/2016  . HTN (hypertension) 05/15/2016  . HLD (hyperlipidemia) 05/15/2016  . Atrial fibrillation (HCC)  03/11/2016  . AKI (acute kidney injury) (HCC) 05/06/2015  . S/P total hip arthroplasty 01/25/2015  . Primary osteoarthritis of left hip 01/23/2015    Past Surgical History:  Procedure Laterality Date  . JOINT REPLACEMENT  01/23/15   Left THR Dr. Rosita Kea   . PACEMAKER INSERTION  2017  . TOTAL HIP ARTHROPLASTY Left 01/23/2015   Procedure: TOTAL HIP ARTHROPLASTY ANTERIOR APPROACH;  Surgeon: Kennedy Bucker, MD;  Location: [facility] ORS;  Service: Orthopedics;  Laterality: Left;    Prior to Admission medications   Medication Sig Start Date End Date Taking? Authorizing Provider  apixaban (ELIQUIS) 5 MG TABS tablet Take 5 mg by mouth 2 (two) times daily.   Yes [provider]  atorvastatin (LIPITOR) 40 MG tablet Take 1 tablet (40 mg total) by mouth at bedtime. 01/16/18  Yes Mody, Patricia Pesa, MD  furosemide (LASIX) 20 MG tablet Take 1 tablet (20 mg total) by mouth daily. 11/22/19  Yes Clarisa Kindred A, FNP  metoprolol tartrate (LOPRESSOR) 50 MG tablet Take 50 mg by mouth 2 (two) times daily.   Yes [provider]  Multiple Vitamins-Minerals (CENTRUM SILVER 50+WOMEN PO) Take 1 tablet by mouth daily.   Yes [provider]    Allergies Morphine and Morphine and related  Family History  Problem Relation Age of Onset  . Hypertension Other     Social History Social History   Tobacco Use  . Smoking status: Never Smoker  . Smokeless tobacco: Never  Used  Substance Use Topics  . Alcohol use: No  . Drug use: No    Review of Systems  Review of Systems  Constitutional: Positive for fatigue. Negative for fever.  HENT: Negative for congestion and sore throat.   Eyes: Negative for visual disturbance.  Respiratory: Positive for shortness of breath. Negative for cough.   Cardiovascular: Positive for leg swelling. Negative for chest pain.  Gastrointestinal: Negative for abdominal pain, diarrhea, nausea and vomiting.  Genitourinary: Negative for flank pain.  Musculoskeletal: Negative for  back pain and neck pain.  Skin: Negative for rash and wound.  Neurological: Positive for weakness.  All other systems reviewed and are negative.    ____________________________________________  PHYSICAL EXAM:      VITAL SIGNS: ED Triage Vitals  Enc Vitals Group     BP 11/28/19 1839 (!) 175/93     Pulse Rate 11/28/19 1839 79     Resp 11/28/19 1839 18     Temp 11/28/19 1839 (!) 97.5 F (36.4 C)     Temp Source 11/28/19 1839 Oral     SpO2 11/28/19 1839 100 %     Weight 11/28/19 1841 146 lb (66.2 kg)     Height 11/28/19 1841 5\' 1"  (1.549 m)     Head Circumference --      Peak Flow --      Pain Score 11/28/19 1841 0     Pain Loc --      Pain Edu? --      Excl. in [place]? --      Physical Exam Vitals and nursing note reviewed.  Constitutional:      General: She is not in acute distress.    Appearance: She is well-developed.  HENT:     Head: Normocephalic and atraumatic.  Eyes:     Conjunctiva/sclera: Conjunctivae normal.  Neck:     Vascular: JVD present.  Cardiovascular:     Rate and Rhythm: Normal rate and regular rhythm.     Heart sounds: Normal heart sounds. No murmur. No friction rub.  Pulmonary:     Effort: Pulmonary effort is normal. No respiratory distress.     Breath sounds: Examination of the right-lower field reveals rales. Examination of the left-lower field reveals rales. Rales present. No wheezing.  Abdominal:     General: There is no distension.     Palpations: Abdomen is soft.     Tenderness: There is no abdominal tenderness.  Musculoskeletal:     Cervical back: Neck supple.     Right lower leg: Edema (3+) present.     Left lower leg: Edema (3+) present.  Skin:    General: Skin is warm.     Capillary Refill: Capillary refill takes less than 2 seconds.  Neurological:     Mental Status: She is alert and oriented to person, place, and time.     Motor: No abnormal muscle tone.       ____________________________________________   LABS (all labs  ordered are listed, but only abnormal results are displayed)  Labs Reviewed  BASIC METABOLIC PANEL - Abnormal; Notable for the following components:      Result Value   Glucose, Bld 154 (*)    BUN 27 (*)    Creatinine, Ser 1.24 (*)    GFR calc non Af Amer 38 (*)    GFR calc Af Amer 44 (*)    All other components within normal limits  CBC - Abnormal; Notable for the following components:   RDW  15.7 (*)    Platelets 142 (*)    All other components within normal limits  BRAIN NATRIURETIC PEPTIDE - Abnormal; Notable for the following components:   B Natriuretic Peptide 461.0 (*)    All other components within normal limits  HEPATIC FUNCTION PANEL - Abnormal; Notable for the following components:   AST 49 (*)    Alkaline Phosphatase 235 (*)    Bilirubin, Direct 0.3 (*)    All other components within normal limits  SARS CORONAVIRUS 2 (TAT 6-24 HRS)  PROTIME-INR  TROPONIN I (HIGH SENSITIVITY)    ____________________________________________  EKG: AV dual paced rhythm, VR 80. QRS 148, QTc 519. Paced rhythm. No ST elevations. ________________________________________  RADIOLOGY All imaging, including plain films, CT scans, and ultrasounds, independently reviewed by me, and interpretations confirmed via formal radiology reads.  ED MD interpretation:   CXR: No acute abnormality, clear  Official radiology report(s): DG Chest 2 View  Result Date: 11/28/2019 CLINICAL DATA:  86 year old female with shortness of breath for 1 hour. Atrial fibrillation EXAM: CHEST - 2 VIEW COMPARISON:  CTA chest 07/30/2019 and earlier. FINDINGS: Stable cardiomegaly and mediastinal contours. Calcified aortic atherosclerosis. Other mediastinal contours are within normal limits. Stable left chest dual lead cardiac pacemaker. Right lower lobe calcified granulomas. No pneumothorax, pulmonary edema, pleural effusion or acute pulmonary opacity. No acute osseous abnormality identified. Negative visible bowel gas  pattern. Left thyroid surgical clips again noted. IMPRESSION: 1. No acute cardiopulmonary abnormality. 2. Stable cardiomegaly.  Aortic Atherosclerosis (ICD10-I70.0). Electronically Signed   By: Odessa Fleming M.D.   On: 11/28/2019 19:18    ____________________________________________  PROCEDURES   Procedure(s) performed (including Critical Care):  Procedures  ____________________________________________  INITIAL IMPRESSION / MDM / ASSESSMENT AND PLAN / ED COURSE  As part of my medical decision making, I reviewed the following data within the electronic MEDICAL RECORD NUMBER Nursing notes reviewed and incorporated, Old chart reviewed, Notes from prior ED visits, and [place] Controlled Substance Database       *JALEEYA MCNELLY was evaluated in Emergency Department on 11/29/2019 for the symptoms described in the history of present illness. She was evaluated in the context of the global COVID-19 pandemic, which necessitated consideration that the patient might be at risk for infection with the SARS-CoV-2 virus that causes COVID-19. Institutional protocols and algorithms that pertain to the evaluation of patients at risk for COVID-19 are in a state of rapid change based on information released by regulatory bodies including the CDC and federal and state organizations. These policies and algorithms were followed during the patient's care in the ED.  Some ED evaluations and interventions may be delayed as a result of limited staffing during the pandemic.*     Medical Decision Making:  86 yo F here with progressively worsening orthopnea, LE edema, and SOB. CXR clear but rales noted on exam with significant pitting edema and marked dyspnea with just standing in ED. Labs show elevated BNP, trop likely 2/2 CHF exacerbation with demand. EKG is nonischemic. Admit to medicine for diuresis.   ____________________________________________  FINAL CLINICAL IMPRESSION(S) / ED DIAGNOSES  Final diagnoses:  Acute on chronic  combined systolic and diastolic congestive heart failure (HCC)     MEDICATIONS GIVEN DURING THIS VISIT:  Medications  furosemide (LASIX) injection 40 mg (40 mg Intravenous Given 11/28/19 2356)     ED Discharge Orders    None       Note:  This document was prepared using Dragon voice recognition software and may include  unintentional dictation errors.   Shaune Pollack, MD 11/29/19 [phone]

## 2019-11-28 NOTE — ED Triage Notes (Signed)
Per triage nurse Weyman Pedro RN) pt reports [facility] with no accomp symptoms today; st hx afib

## 2019-11-29 ENCOUNTER — Inpatient Hospital Stay
Admit: 2019-11-29 | Discharge: 2019-11-29 | Disposition: A | Discharge status: Home / Self Care | Payer: Medicare Other | Attending: Internal Medicine | Admitting: Internal Medicine

## 2019-11-29 DIAGNOSIS — Z96642 Presence of left artificial hip joint: Secondary | ICD-10-CM | POA: Diagnosis present

## 2019-11-29 DIAGNOSIS — I5033 Acute on chronic diastolic (congestive) heart failure: Secondary | ICD-10-CM | POA: Diagnosis present

## 2019-11-29 DIAGNOSIS — I110 Hypertensive heart disease with heart failure: Secondary | ICD-10-CM | POA: Diagnosis present

## 2019-11-29 DIAGNOSIS — E7800 Pure hypercholesterolemia, unspecified: Secondary | ICD-10-CM | POA: Diagnosis present

## 2019-11-29 DIAGNOSIS — E785 Hyperlipidemia, unspecified: Secondary | ICD-10-CM

## 2019-11-29 DIAGNOSIS — I10 Essential (primary) hypertension: Secondary | ICD-10-CM

## 2019-11-29 DIAGNOSIS — M1990 Unspecified osteoarthritis, unspecified site: Secondary | ICD-10-CM | POA: Diagnosis present

## 2019-11-29 DIAGNOSIS — Z8673 Personal history of transient ischemic attack (TIA), and cerebral infarction without residual deficits: Secondary | ICD-10-CM | POA: Diagnosis not present

## 2019-11-29 DIAGNOSIS — Z7901 Long term (current) use of anticoagulants: Secondary | ICD-10-CM | POA: Diagnosis not present

## 2019-11-29 DIAGNOSIS — I5043 Acute on chronic combined systolic (congestive) and diastolic (congestive) heart failure: Secondary | ICD-10-CM | POA: Diagnosis present

## 2019-11-29 DIAGNOSIS — I4891 Unspecified atrial fibrillation: Secondary | ICD-10-CM | POA: Diagnosis present

## 2019-11-29 DIAGNOSIS — Z20822 Contact with and (suspected) exposure to covid-19: Secondary | ICD-10-CM | POA: Diagnosis present

## 2019-11-29 DIAGNOSIS — Z66 Do not resuscitate: Secondary | ICD-10-CM | POA: Diagnosis present

## 2019-11-29 DIAGNOSIS — Z950 Presence of cardiac pacemaker: Secondary | ICD-10-CM | POA: Diagnosis not present

## 2019-11-29 DIAGNOSIS — I700 Atherosclerosis of aorta: Secondary | ICD-10-CM | POA: Diagnosis present

## 2019-11-29 DIAGNOSIS — G459 Transient cerebral ischemic attack, unspecified: Secondary | ICD-10-CM

## 2019-11-29 LAB — BASIC METABOLIC PANEL
Anion gap: 9 (ref 5–15)
BUN: 28 mg/dL — ABNORMAL HIGH (ref 8–23)
CO2: 33 mmol/L — ABNORMAL HIGH (ref 22–32)
Calcium: 9 mg/dL (ref 8.9–10.3)
Chloride: 101 mmol/L (ref 98–111)
Creatinine, Ser: 1.07 mg/dL — ABNORMAL HIGH (ref 0.44–1.00)
GFR calc Af Amer: 52 mL/min — ABNORMAL LOW (ref >60)
GFR calc non Af Amer: 45 mL/min — ABNORMAL LOW (ref >60)
Glucose, Bld: 92 mg/dL (ref 70–99)
Potassium: 3.5 mmol/L (ref 3.5–5.1)
Sodium: 143 mmol/L (ref 135–145)

## 2019-11-29 LAB — GLUCOSE, CAPILLARY: Glucose-Capillary: 135 mg/dL — ABNORMAL HIGH (ref 70–99)

## 2019-11-29 LAB — MAGNESIUM: Magnesium: 2.1 mg/dL (ref 1.7–2.4)

## 2019-11-29 LAB — SARS CORONAVIRUS 2 (TAT 6-24 HRS): SARS Coronavirus 2: NEGATIVE

## 2019-11-29 LAB — TSH: TSH: 4.038 u[IU]/mL (ref 0.350–4.500)

## 2019-11-29 MED ORDER — APIXABAN 5 MG PO TABS
5.0000 mg | ORAL_TABLET | Freq: Two times a day (BID) | ORAL | Status: DC
  Administered 2019-11-29 – 2019-11-30 (×3): 5 mg via ORAL
  Filled 2019-11-29 (×3): qty 1

## 2019-11-29 MED ORDER — ATORVASTATIN CALCIUM 20 MG PO TABS
40.0000 mg | ORAL_TABLET | Freq: Every day | ORAL | Status: DC
  Administered 2019-11-29: 40 mg via ORAL
  Filled 2019-11-29: qty 2

## 2019-11-29 MED ORDER — ONDANSETRON HCL 4 MG/2ML IJ SOLN: 4.0000 mg | Freq: Four times a day (QID) | INTRAMUSCULAR | Status: DC | PRN

## 2019-11-29 MED ORDER — METOPROLOL TARTRATE 50 MG PO TABS
50.0000 mg | ORAL_TABLET | Freq: Two times a day (BID) | ORAL | Status: DC
  Administered 2019-11-29 – 2019-11-30 (×3): 50 mg via ORAL
  Filled 2019-11-29 (×3): qty 1

## 2019-11-29 MED ORDER — ACETAMINOPHEN 325 MG PO TABS: 650.0000 mg | ORAL_TABLET | ORAL | Status: DC | PRN

## 2019-11-29 MED ORDER — POTASSIUM CHLORIDE CRYS ER 20 MEQ PO TBCR
40.0000 meq | EXTENDED_RELEASE_TABLET | Freq: Once | ORAL | Status: AC
  Administered 2019-11-29: 40 meq via ORAL
  Filled 2019-11-29: qty 2

## 2019-11-29 MED ORDER — SODIUM CHLORIDE 0.9 % IV SOLN: 250.0000 mL | INTRAVENOUS | Status: DC | PRN

## 2019-11-29 MED ORDER — FUROSEMIDE 10 MG/ML IJ SOLN
40.0000 mg | Freq: Two times a day (BID) | INTRAMUSCULAR | Status: DC
  Administered 2019-11-29 – 2019-11-30 (×2): 40 mg via INTRAVENOUS
  Filled 2019-11-29 (×2): qty 4

## 2019-11-29 MED ORDER — SODIUM CHLORIDE 0.9% FLUSH
3.0000 mL | Freq: Two times a day (BID) | INTRAVENOUS | Status: DC
  Administered 2019-11-29 – 2019-11-30 (×3): 3 mL via INTRAVENOUS

## 2019-11-29 MED ORDER — SODIUM CHLORIDE 0.9% FLUSH: 3.0000 mL | INTRAVENOUS | Status: DC | PRN

## 2019-11-29 MED ORDER — FUROSEMIDE 10 MG/ML IJ SOLN
40.0000 mg | Freq: Once | INTRAMUSCULAR | Status: AC
  Administered 2019-11-29: 40 mg via INTRAVENOUS
  Filled 2019-11-29: qty 4

## 2019-11-29 NOTE — Progress Notes (Signed)
*  PRELIMINARY RESULTS* Echocardiogram 2D Echocardiogram has been performed.  Colleen Carson 11/29/2019, 2:31 PM

## 2019-11-29 NOTE — Progress Notes (Signed)
PROGRESS NOTE    Colleen Carson  JXB:[id number] DOB: December 19, 1924 DOA: 11/28/2019 PCP: Kandyce Rud, MD    Brief Narrative:   Colleen Carson is a 86 y.o. fairly functional female with a pertinent history of chronic diastolic HF (EF2019 55% EF w/ moderate MR), atrial fibrillation s/p pacemaker, HTN, HLD, who presents to the ED with shortness of breath.  Patient has gained about 10 pounds in the last week    Consultants:   None  Procedures: Echo  Antimicrobials:   None   Subjective: Feeling a little bit better from shortness of breath standpoint.  Urinated.  Has no new complaints.  Objective: Vitals:   11/29/19 0604 11/29/19 0821 11/29/19 1141 11/29/19 1630  BP:  139/80 115/79 128/78  Pulse:  60 80 (!) 59  Resp:      Temp:  98.9 F (37.2 C) (!) 97.5 F (36.4 C) 97.7 F (36.5 C)  TempSrc:  Oral Oral Oral  SpO2:  97% 96% 99%  Weight: 65.9 kg     Height: 5\' 1"  (1.549 m)       Intake/Output Summary (Last 24 hours) at 11/29/2019 1719 Last data filed at 11/29/2019 1600 Gross per 24 hour  Intake 1070 ml  Output 2500 ml  Net -1430 ml   Filed Weights   11/28/19 1841 11/29/19 0604  Weight: 66.2 kg 65.9 kg    Examination:  General exam: Appears calm and comfortable  HEENT: +JVD Respiratory system: Clear to auscultation. Respiratory effort normal. Cardiovascular system: S1 & S2 heard, RRR. No JVD, murmurs, rubs, gallops or clicks. Gastrointestinal system: Abdomen is nondistended, soft and nontender. Normal bowel sounds heard. Central nervous system: Alert and oriented. No focal neurological deficits. Extremities: Mild edema, lymphedema Skin: Dry, warm Psychiatry: Judgement and insight appear normal. Mood & affect appropriate.     Data Reviewed: I have personally reviewed following labs and imaging studies  CBC: Recent Labs  Lab 11/28/19 1848  WBC 4.8  HGB 13.2  HCT 41.5  MCV 94.1  PLT 142*   Basic Metabolic Panel: Recent Labs  Lab 11/28/19 1848  11/29/19 0614  NA 139 143  K 4.4 3.5  CL 101 101  CO2 28 33*  GLUCOSE 154* 92  BUN 27* 28*  CREATININE 1.24* 1.07*  CALCIUM 9.5 9.0  MG  --  2.1   GFR: Estimated Creatinine Clearance: 29.1 mL/min (A) (by C-G formula based on SCr of 1.07 mg/dL (H)). Liver Function Tests: Recent Labs  Lab 11/28/19 1848  AST 49*  ALT 25  ALKPHOS 235*  BILITOT 0.8  PROT 7.0  ALBUMIN 4.2   No results for input(s): LIPASE, AMYLASE in the last 168 hours. No results for input(s): AMMONIA in the last 168 hours. Coagulation Profile: Recent Labs  Lab 11/28/19 1848  INR 1.2   Cardiac Enzymes: No results for input(s): CKTOTAL, CKMB, CKMBINDEX, TROPONINI in the last 168 hours. BNP (last 3 results) No results for input(s): PROBNP in the last 8760 hours. HbA1C: No results for input(s): HGBA1C in the last 72 hours. CBG: No results for input(s): GLUCAP in the last 168 hours. Lipid Profile: No results for input(s): CHOL, HDL, LDLCALC, TRIG, CHOLHDL, LDLDIRECT in the last 72 hours. Thyroid Function Tests: Recent Labs    11/29/19 0614  TSH 4.038   Anemia Panel: No results for input(s): VITAMINB12, FOLATE, FERRITIN, TIBC, IRON, RETICCTPCT in the last 72 hours. Sepsis Labs: No results for input(s): PROCALCITON, LATICACIDVEN in the last 168 hours.  Recent Results (from the  past 240 hour(s))  SARS CORONAVIRUS 2 (TAT 6-24 HRS) Nasopharyngeal Nasopharyngeal Swab     Status: None   Collection Time: 11/28/19 11:46 PM   Specimen: Nasopharyngeal Swab  Result Value Ref Range Status   SARS Coronavirus 2 NEGATIVE NEGATIVE Final    Comment: (NOTE) SARS-CoV-2 target nucleic acids are NOT DETECTED. The SARS-CoV-2 RNA is generally detectable in upper and lower respiratory specimens during the acute phase of infection. Negative results do not preclude SARS-CoV-2 infection, do not rule out co-infections with other pathogens, and should not be used as the sole basis for treatment or other patient management  decisions. Negative results must be combined with clinical observations, patient history, and epidemiological information. The expected result is Negative. Fact Sheet for Patients: SugarRoll.be Fact Sheet for Healthcare Providers: https://www.woods-mathews.com/ This test is not yet approved or cleared by the [place] FDA and  has been authorized for detection and/or diagnosis of SARS-CoV-2 by FDA under an Emergency Use Authorization (EUA). This EUA will remain  in effect (meaning this test can be used) for the duration of the COVID-19 declaration under Section 56 4(b)(1) of the Act, 21 U.S.C. section 360bbb-3(b)(1), unless the authorization is terminated or revoked sooner. Performed at [facility], [place] [address], [place],  [address]          Radiology Studies: DG Chest 2 View  Result Date: 11/28/2019 CLINICAL DATA:  86 year old female with shortness of breath for 1 hour. Atrial fibrillation EXAM: CHEST - 2 VIEW COMPARISON:  CTA chest 07/30/2019 and earlier. FINDINGS: Stable cardiomegaly and mediastinal contours. Calcified aortic atherosclerosis. Other mediastinal contours are within normal limits. Stable left chest dual lead cardiac pacemaker. Right lower lobe calcified granulomas. No pneumothorax, pulmonary edema, pleural effusion or acute pulmonary opacity. No acute osseous abnormality identified. Negative visible bowel gas pattern. Left thyroid surgical clips again noted. IMPRESSION: 1. No acute cardiopulmonary abnormality. 2. Stable cardiomegaly.  Aortic Atherosclerosis (ICD10-I70.0). Electronically Signed   By: Genevie Ann M.D.   On: 11/28/2019 19:18        Scheduled Meds: . apixaban  5 mg Oral BID  . atorvastatin  40 mg Oral QHS  . metoprolol tartrate  50 mg Oral BID  . sodium chloride flush  3 mL Intravenous Q12H   Continuous Infusions: . sodium chloride      Assessment & Plan:   Active Problems:   Atrial  fibrillation (HCC)   HTN (hypertension)   HLD (hyperlipidemia)   TIA (transient ischemic attack)   Acute on chronic diastolic heart failure (HCC)   Acute on chronic diastolic HF exacerbation,-improving with diuresis.  --BNP 461 up from 300's.  Baseline weight said to be around 133. Still volume overloaded on exam. --telemetry --TSH, bmp, mag, goal of K>4, Mg >2 --lasix 40mg  iv bid --repeat high sensitivity troponin  --repeat TTE since has been over a year -I/o --Will continue metoprolol tartrate 50mg  BID  Will need to f/u with cardiology when discharged. Monitor renal function    Afib - takes toprol and has a pacemaker. Cont' eliquis HTN-stable on beta blk. HLD - cont statin H/o TIA - quite a while ago, she thinks three of them over 5 years    DVT prophylaxis: Eliquis  Code Status:dnr Family Communication: none at bedside Disposition Plan: d/c back home in 1-2 days when more euvolemic .  Barrier: still volume overloaded and needs iv lasix.        LOS: 0 days   Time spent: 45 minutes with more  than 50% COC    Lynn Ito, MD [facility] Pager [phone]  If 7PM-7AM, please contact night-coverage [email or web address] Password [facility] 11/29/2019, 5:19 PM

## 2019-11-29 NOTE — ED Notes (Signed)
Pt is sleeping on stretcher and appears to be comfortable. Respirations are equal and unlabored, no signs of acute distress.

## 2019-11-29 NOTE — ED Notes (Signed)
This RN called pt daughter, Raynelle Fanning, and informed her on pt plan of care and admission. Daughter states understanding and denies any further questions at this time.

## 2019-11-29 NOTE — ED Notes (Signed)
This RN assisted pt with ambulation to restroom. Pt ambulated independently and with steady gait. This RN changed pt undergarments and sheets which were wet from urine. Pt updated on bed status, and denies any further questions at this time.

## 2019-11-29 NOTE — ED Notes (Signed)
Pt is resting in bed and states that she feels like her SOB has decreased. Pt states that O2 has eased her difficulty breathing. Pt respirations are equal and unlabored, no signs of acute distress.

## 2019-11-29 NOTE — ED Notes (Signed)
Pt is lying in bed, and states that she is comfortable. Respirations are equal and unlabored, no signs of acute distress.

## 2019-11-30 LAB — BASIC METABOLIC PANEL
Anion gap: 12 (ref 5–15)
BUN: 37 mg/dL — ABNORMAL HIGH (ref 8–23)
CO2: 32 mmol/L (ref 22–32)
Calcium: 9.1 mg/dL (ref 8.9–10.3)
Chloride: 97 mmol/L — ABNORMAL LOW (ref 98–111)
Creatinine, Ser: 1.14 mg/dL — ABNORMAL HIGH (ref 0.44–1.00)
GFR calc Af Amer: 48 mL/min — ABNORMAL LOW (ref >60)
GFR calc non Af Amer: 42 mL/min — ABNORMAL LOW (ref >60)
Glucose, Bld: 94 mg/dL (ref 70–99)
Potassium: 3.6 mmol/L (ref 3.5–5.1)
Sodium: 141 mmol/L (ref 135–145)

## 2019-11-30 LAB — MAGNESIUM: Magnesium: 1.9 mg/dL (ref 1.7–2.4)

## 2019-11-30 LAB — ECHOCARDIOGRAM COMPLETE
Height: 61 in
Weight: 2324.8 oz

## 2019-11-30 LAB — CBC
HCT: 40.1 % (ref 36.0–46.0)
Hemoglobin: 13.3 g/dL (ref 12.0–15.0)
MCH: 29.7 pg (ref 26.0–34.0)
MCHC: 33.2 g/dL (ref 30.0–36.0)
MCV: 89.5 fL (ref 80.0–100.0)
Platelets: 124 10*3/uL — ABNORMAL LOW (ref 150–400)
RBC: 4.48 MIL/uL (ref 3.87–5.11)
RDW: 15.2 % (ref 11.5–15.5)
WBC: 5.7 10*3/uL (ref 4.0–10.5)
nRBC: 0 % (ref 0.0–0.2)

## 2019-11-30 NOTE — Progress Notes (Signed)
Written and verbal discharge instructions discussed with pt. Discussed medication, follow-up appointment, HF education and when to call MD or return to ER. Pt verbalized understanding. IV and tele dc'd. Belongings with pt. Pt to car via wheelchair by tech where daughter was waiting.

## 2019-11-30 NOTE — TOC Transition Note (Signed)
Transition of Care [facility]) - CM/SW Discharge Note   Patient Details  Name: Colleen Carson MRN: [id number] Date of Birth: November 01, 1924  [facility]) CM/SW Contact:  Victorino Dike, RN Phone Number: 11/30/2019, 10:22 AM   Clinical Narrative:    Met with patient in room.  She reports not using a walker at home, but does when she is out in public.  She has needed SBA with ambulating in room and it is felt she could benefit from [facility] PT.  Patient is agreeable to this.  [facility] to provide these services in the home. No further TOC needs at this time, please re-consult for new needs.    Final next level of care: [place] Barriers to Discharge: Barriers Resolved   Patient Goals and CMS Choice Patient states their goals for this hospitalization and ongoing recovery are:: to be as healthy as I can. CMS Medicare.gov Compare Post Acute Care list provided to:: Patient Choice offered to / list presented to : Patient  Discharge Placement                       Discharge Plan and Services                          HH Arranged: PT [facility] Agency: [place] ([facility]) Date [place]: 11/30/19 Time [place]: 1021 Representative spoke with at [place]: [place] ([place]) Interventions     Readmission Risk Interventions No flowsheet data found.

## 2019-11-30 NOTE — Plan of Care (Signed)
  Problem: Clinical Measurements: Goal: Ability to maintain clinical measurements within normal limits will improve Outcome: Progressing Goal: Diagnostic test results will improve Outcome: Progressing Goal: Respiratory complications will improve Outcome: Progressing   Problem: Elimination: Goal: Will not experience complications related to bowel motility Outcome: Progressing Goal: Will not experience complications related to urinary retention Outcome: Progressing   Problem: Safety: Goal: Ability to remain free from injury will improve Outcome: Progressing   Problem: Activity: Goal: Capacity to carry out activities will improve Outcome: Progressing   Problem: Cardiac: Goal: Ability to achieve and maintain adequate cardiopulmonary perfusion will improve Outcome: Progressing

## 2019-12-06 NOTE — Progress Notes (Signed)
Patient ID: Colleen Carson, female    DOB: Mar 04, 1925, 86 y.o.   MRN: [id number]  HPI   Colleen Carson is a 86 y/o female with a history of arthritis, atrial fibrillation, hyperlipidemia, HTN, lymphedema, shingles, CVA and chronic heart failure.  Echo report from 11/29/19 reviewed and showed an EF of 40-45% along with mild/moderate MR, moderate/ severe TR and mildly elevated PA pressure. Reviewed echo report done 01/16/18 which showed an EF of 55-65% along with moderate MR and moderate/severe TR. Reviewed echo report done 02/28/17 which showed an EF of 40-45% along with moderate AS, mild MR and mild/mod TR. PA pressure at 31 mm Hg.   Admitted 11/28/19 due to acute on chronic HF. Initially given IV lasix and then transitioned to oral diuretics. Discharged after 2 days. Was in the ED 06/21/2019 due to acute on chronic HF and fatigue where she was treated and released.   She presents today for her follow-up visit with a chief complaint of minimal shortness of breath upon moderate exertion. She describes this as chronic in nature having been present for several years. She says that her breathing has improved since her recent admission. She has associated pedal edema, anxiety and gradual weight gain along with this. She denies any difficulty sleeping, dizziness, abdominal distention, palpitations, chest pain, cough or fatigue.    Home weight chart reviewed and shows a 3 pound weight gain in the last week. She says that she took 2 furosemide tablets both yesterday and today. Feels like her compression socks on her right leg are too tight and cut into her leg.   Past Medical History:  Diagnosis Date  . Arthritis   . Atrial fibrillation (HCC)   . CHF (congestive heart failure) (HCC)   . Dysrhythmia    Afib  . Hypercholesteremia   . Hypertension   . Lymphedema    lower extremities  . S/P total hip arthroplasty 01/25/2015  . Shingles   . Stroke [facility])    noted ischemia on CT scan   Past Surgical History:   Procedure Laterality Date  . JOINT REPLACEMENT  01/23/15   Left THR Dr. Rosita Kea   . PACEMAKER INSERTION  2017  . TOTAL HIP ARTHROPLASTY Left 01/23/2015   Procedure: TOTAL HIP ARTHROPLASTY ANTERIOR APPROACH;  Surgeon: Kennedy Bucker, MD;  Location: [facility] ORS;  Service: Orthopedics;  Laterality: Left;   Family History  Problem Relation Age of Onset  . Hypertension Other    Social History   Tobacco Use  . Smoking status: Never Smoker  . Smokeless tobacco: Never Used  Substance Use Topics  . Alcohol use: No   Allergies  Allergen Reactions  . Morphine Nausea Only    Hallucinations, nausea, vomitting  . Morphine And Related Nausea And Vomiting   Prior to Admission medications   Medication Sig Start Date End Date Taking? Authorizing Provider  apixaban (ELIQUIS) 5 MG TABS tablet Take 5 mg by mouth 2 (two) times daily.   Yes [provider]  atorvastatin (LIPITOR) 40 MG tablet Take 1 tablet (40 mg total) by mouth at bedtime. 01/16/18  Yes Mody, Patricia Pesa, MD  cetirizine (ZYRTEC) 10 MG tablet Take 10 mg by mouth daily.   Yes [provider]  furosemide (LASIX) 20 MG tablet Take 1 tablet (20 mg total) by mouth daily. 11/22/19  Yes Clarisa Kindred A, FNP  metoprolol tartrate (LOPRESSOR) 50 MG tablet Take 50 mg by mouth 2 (two) times daily.   Yes [provider]  Multiple  Vitamins-Minerals (CENTRUM SILVER 50+WOMEN PO) Take 1 tablet by mouth daily.   Yes [provider]     Review of Systems  Constitutional: Negative for appetite change and fatigue.  HENT: Negative for congestion, postnasal drip and sore throat.   Eyes: Negative.   Respiratory: Positive for shortness of breath (with moderate exertion). Negative for cough and chest tightness.   Cardiovascular: Positive for leg swelling (increasing). Negative for chest pain and palpitations.  Gastrointestinal: Negative for abdominal distention and abdominal pain.  Endocrine: Negative.   Genitourinary: Negative.    Musculoskeletal: Negative for back pain and neck pain.  Skin: Negative.   Allergic/Immunologic: Negative.   Neurological: Negative for dizziness, weakness and light-headedness.  Hematological: Negative for adenopathy. Does not bruise/bleed easily.  Psychiatric/Behavioral: Negative for dysphoric mood and sleep disturbance (sleeping on 1 pillow). The patient is nervous/anxious.    Vitals:   12/07/19 1008  BP: 120/79  Pulse: 79  Resp: 18  SpO2: 100%  Weight: 144 lb 2 oz (65.4 kg)  Height: 5\' 1"  (1.549 m)   Wt Readings from Last 3 Encounters:  12/07/19 144 lb 2 oz (65.4 kg)  11/30/19 138 lb 6.4 oz (62.8 kg)  10/13/19 142 lb (64.4 kg)   Lab Results  Component Value Date   CREATININE 1.14 (H) 11/30/2019   CREATININE 1.07 (H) 11/29/2019   CREATININE 1.24 (H) 11/28/2019     Physical Exam  Constitutional: She is oriented to person, place, and time. She appears well-developed and well-nourished.  HENT:  Head: Normocephalic and atraumatic.  Neck: No JVD present.  Cardiovascular: Normal rate and regular rhythm.  Pulmonary/Chest: Effort normal. She has no wheezes. She has no rales.  Abdominal: Soft. She exhibits no distension. There is no abdominal tenderness.  Musculoskeletal:        General: Edema (1+ pitting bilateral) present. No tenderness.     Cervical back: Normal range of motion and neck supple.  Neurological: She is alert and oriented to person, place, and time.  Skin: Skin is warm and dry.  Psychiatric: She has a normal mood and affect. Her behavior is normal. Thought content normal.  Nursing note and vitals reviewed.   Assessment & Plan:  1: Chronic heart failure with preserved ejection fraction- - NYHA class II - euvolemic today - weighing daily; reminded to call for an overnight weight gain of >2 pounds or a weekly weight gain of >5 pounds - weight up 2 pounds from last visit 2 months ago but up 3 pounds in the last week by her home scale - will stop her  furosemide and begin torsemide 40mg  daily - will check BMP at her next visit - not adding salt to her food and her daughter is reading food labels and cooking foods from scratch. Reviewed the importance of closely following a 2000mg  sodium diet - encouraged her to drink 40-50 ounces of fluid daily - saw cardiologist Ubaldo Glassing) 12/05/19 - getting PT at home twice / week - BNP 11/28/19 was 461.0 - has received her flu vaccine  2: HTN- - BP looks good today - saw PCP Baldemar Lenis) 11/04/19 & returns tomorrow - BMP from 11/30/19 reviewed and showed sodium 141, potassium 3.6, creatinine 1.14 and GFR 42  3: Lymphedema- - stage 2 - does exercise by doing quite a bit of walking - has worn compression socks in the past but says that they hurt her legs; advised her to try a larger size - does elevate her legs when she's sitting for long periods  of time with improvement in her edema - does have compression boots that she has worn in the past but she does not like to wear them; encouraged her to try wearing them again even for 10 minutes at a time   Medication bottles were reviewed.   Return in 2 weeks or sooner for any questions/problems before then.

## 2019-12-07 ENCOUNTER — Other Ambulatory Visit: Payer: Self-pay

## 2019-12-07 ENCOUNTER — Ambulatory Visit: Payer: Medicare Other | Attending: Family | Admitting: Family

## 2019-12-07 ENCOUNTER — Encounter: Payer: Self-pay | Admitting: Family

## 2019-12-07 VITALS — BP 120/79 | HR 79 | Resp 18 | Ht 61.0 in | Wt 144.1 lb

## 2019-12-07 DIAGNOSIS — Z8673 Personal history of transient ischemic attack (TIA), and cerebral infarction without residual deficits: Secondary | ICD-10-CM | POA: Diagnosis not present

## 2019-12-07 DIAGNOSIS — I110 Hypertensive heart disease with heart failure: Secondary | ICD-10-CM | POA: Insufficient documentation

## 2019-12-07 DIAGNOSIS — E785 Hyperlipidemia, unspecified: Secondary | ICD-10-CM | POA: Diagnosis not present

## 2019-12-07 DIAGNOSIS — I10 Essential (primary) hypertension: Secondary | ICD-10-CM

## 2019-12-07 DIAGNOSIS — Z950 Presence of cardiac pacemaker: Secondary | ICD-10-CM | POA: Insufficient documentation

## 2019-12-07 DIAGNOSIS — E7800 Pure hypercholesterolemia, unspecified: Secondary | ICD-10-CM | POA: Insufficient documentation

## 2019-12-07 DIAGNOSIS — I4891 Unspecified atrial fibrillation: Secondary | ICD-10-CM | POA: Insufficient documentation

## 2019-12-07 DIAGNOSIS — Z7901 Long term (current) use of anticoagulants: Secondary | ICD-10-CM | POA: Diagnosis not present

## 2019-12-07 DIAGNOSIS — Z8619 Personal history of other infectious and parasitic diseases: Secondary | ICD-10-CM | POA: Insufficient documentation

## 2019-12-07 DIAGNOSIS — I890 Lymphedema, not elsewhere classified: Secondary | ICD-10-CM | POA: Diagnosis not present

## 2019-12-07 DIAGNOSIS — M1990 Unspecified osteoarthritis, unspecified site: Secondary | ICD-10-CM | POA: Insufficient documentation

## 2019-12-07 DIAGNOSIS — Z79899 Other long term (current) drug therapy: Secondary | ICD-10-CM | POA: Diagnosis not present

## 2019-12-07 DIAGNOSIS — I5032 Chronic diastolic (congestive) heart failure: Secondary | ICD-10-CM | POA: Diagnosis present

## 2019-12-07 DIAGNOSIS — Z8249 Family history of ischemic heart disease and other diseases of the circulatory system: Secondary | ICD-10-CM | POA: Insufficient documentation

## 2019-12-07 DIAGNOSIS — F419 Anxiety disorder, unspecified: Secondary | ICD-10-CM | POA: Insufficient documentation

## 2019-12-07 MED ORDER — TORSEMIDE 20 MG PO TABS: 40.0000 mg | ORAL_TABLET | Freq: Every day | ORAL | 3 refills | Status: DC

## 2019-12-07 NOTE — Patient Instructions (Addendum)
Continue weighing daily and call for an overnight weight gain of > 2 pounds or a weekly weight gain of >5 pounds.  Stop taking furosemide  (lasix) and begin taking torsemide 2 tablets daily beginning tomorrow

## 2019-12-13 NOTE — Discharge Summary (Signed)
Physician Discharge Summary  LEINANI LISBON IEP:[id number] DOB: 01/27/1925 DOA: 84/04/2020  PCP: Derinda Late, Carson  Admit date: 11/28/2019 Discharge date: 11/30/2019  Admitted From: Home Disposition:  Home  Recommendations for Outpatient Follow-up:  1. Follow up with PCP in 1-2 weeks 2. Please obtain BMP/CBC in one week 3. Please follow up with cardiology  Home Health: yes  Equipment/Devices: none   Discharge Condition: Stable  CODE STATUS: DNR  Diet recommendation: Heart Healthy   Brief/Interim Summary:  Colleen Parisi Hinmanis a 86 y.o.fairlyfunctionalfemalewith a pertinent history ofchronic diastolic YS(AY3016 01% EF w/ moderate MR), atrial fibrillations/p pacemaker, HTN, HLD,who presents to the ED withshortness of breath. Patient has gained about 10 pounds in the last week.  Admitted for acute on chronic diastolic CHF.  Echocardiogram obtained and showed EF 45-50%.  Significantly improved with diuresis.  Shortness of breath improved to baseline.  Patient stable for discharge home with Colleen Carson and close cardiology follow up.    Discharge Diagnoses: Active Problems:   Atrial fibrillation (HCC)   HTN (hypertension)   HLD (hyperlipidemia)   TIA (transient ischemic attack)   Acute on chronic diastolic heart failure [facility])    Discharge Instructions   Discharge Instructions    (HEART FAILURE PATIENTS) Call Carson:  Anytime you have any of the following symptoms: 1) 3 pound weight gain in 24 hours or 5 pounds in 1 week 2) shortness of breath, with or without a dry hacking cough 3) swelling in the hands, feet or stomach 4) if you have to sleep on extra pillows at night in order to breathe.   Complete by: As directed    AMB referral to CHF clinic   Complete by: As directed    AMB referral to pulmonary rehabilitation   Complete by: As directed    Please select a program: Respiratory Care Services   Respiratory Care Services Diagnosis: Heart Failure   After initial evaluation and  assessments completed: Virtual Based Care may be provided alone or in conjunction with Pulmonary Rehab/Respiratory Care services based on patient barriers.: Yes   Call Carson for:  extreme fatigue   Complete by: As directed    Call Carson for:  persistant dizziness or light-headedness   Complete by: As directed    Diet - low sodium heart healthy   Complete by: As directed    Heart Failure patients record your daily weight using the same scale at the same time of day   Complete by: As directed    Increase activity slowly   Complete by: As directed    STOP any activity that causes chest pain, shortness of breath, dizziness, sweating, or exessive weakness   Complete by: As directed      Allergies as of 11/30/2019      Reactions   Morphine Nausea Only   Hallucinations, nausea, vomitting   Morphine And Related Nausea And Vomiting      Medication List    TAKE these medications   atorvastatin 40 MG tablet Commonly known as: Lipitor Take 1 tablet (40 mg total) by mouth at bedtime.   CENTRUM SILVER 50+WOMEN PO Take 1 tablet by mouth daily.   Eliquis 5 MG Tabs tablet Generic drug: apixaban Take 5 mg by mouth 2 (two) times daily.   metoprolol tartrate 50 MG tablet Commonly known as: LOPRESSOR Take 50 mg by mouth 2 (two) times daily.      Follow-up Information    [facility] Follow up on 12/07/2019.  Specialty: Cardiology Why: at 10:00am. Enter through the Medical Mall entrance Contact information: [address] [place] [address] [phone]       Colleen Carson. Schedule an appointment as soon as possible for a visit on 12/05/2019.   Specialty: Cardiology Why: Appointment at 3:15pm Contact information: [address] [place] [address] [phone]        Colleen Carson. Schedule an appointment as soon as possible for a visit on 12/08/2019.   Specialty: Family Medicine Why:  Appointment at 3pm Contact information: [address] [facility] [place] [address] [phone]          Allergies  Allergen Reactions  . Morphine Nausea Only    Hallucinations, nausea, vomitting  . Morphine And Related Nausea And Vomiting       Procedures/Studies: DG Chest 2 View  Result Date: 11/28/2019 CLINICAL DATA:  86 year old female with shortness of breath for 1 hour. Atrial fibrillation EXAM: CHEST - 2 VIEW COMPARISON:  CTA chest 07/30/2019 and earlier. FINDINGS: Stable cardiomegaly and mediastinal contours. Calcified aortic atherosclerosis. Other mediastinal contours are within normal limits. Stable left chest dual lead cardiac pacemaker. Right lower lobe calcified granulomas. No pneumothorax, pulmonary edema, pleural effusion or acute pulmonary opacity. No acute osseous abnormality identified. Negative visible bowel gas pattern. Left thyroid surgical clips again noted. IMPRESSION: 1. No acute cardiopulmonary abnormality. 2. Stable cardiomegaly.  Aortic Atherosclerosis (ICD10-I70.0). Electronically Signed   By: Odessa Fleming M.D.   On: 11/28/2019 19:18   ECHOCARDIOGRAM COMPLETE  Result Date: 11/30/2019    ECHOCARDIOGRAM REPORT   Patient Name:   Colleen [facility] Date of Exam: 11/29/2019 Medical Rec #:  [id number]      Height:       61.0 in Accession #:    [id number]     Weight:       145.3 lb Date of Birth:  September 15, 1925      BSA:          1.649 m Patient Age:    86 years       BP:           115/79 mmHg Patient Gender: F              HR:           80 bpm. Exam Location:  [facility] Procedure: 2D Echo, Cardiac Doppler and Color Doppler Indications:     CHF 428.0  History:         Patient has prior history of Echocardiogram examinations, most                  recent 01/16/2018. CHF, Pacemaker, Stroke, Arrythmias:Atrial                  Fibrillation; Risk Factors:Hypertension.  Sonographer:     Colleen Blue RDCS (AE) Referring Phys:  [id number] Charlane Ferretti  Diagnosing Phys: Marcina Millard Carson IMPRESSIONS  1. Left ventricular ejection fraction, by estimation, is 45 to 50%. The left ventricle has mildly decreased function. The left ventricle has no regional wall motion abnormalities. Left ventricular diastolic parameters are indeterminate.  2. Right ventricular systolic function is normal. The right ventricular size is mildly enlarged. There is mildly elevated pulmonary artery systolic pressure.  3. Right atrial size was severely dilated.  4. The mitral valve is normal in structure. Mild to moderate mitral valve regurgitation. No evidence of mitral stenosis.  5. Tricuspid  valve regurgitation is moderate to severe.  6. The aortic valve is normal in structure. Aortic valve regurgitation is mild. No aortic stenosis is present.  7. The inferior vena cava is normal in size with greater than 50% respiratory variability, suggesting right atrial pressure of 3 mmHg. FINDINGS  Left Ventricle: Left ventricular ejection fraction, by estimation, is 45 to 50%. The left ventricle has mildly decreased function. The left ventricle has no regional wall motion abnormalities. The left ventricular internal cavity size was normal in size. There is no left ventricular hypertrophy. Left ventricular diastolic parameters are indeterminate. Right Ventricle: The right ventricular size is mildly enlarged. No increase in right ventricular wall thickness. Right ventricular systolic function is normal. There is mildly elevated pulmonary artery systolic pressure. The tricuspid regurgitant velocity is 2.29 m/s, and with an assumed right atrial pressure of 10 mmHg, the estimated right ventricular systolic pressure is 31.0 mmHg. Left Atrium: Left atrial size was normal in size. Right Atrium: Right atrial size was severely dilated. Pericardium: There is no evidence of pericardial effusion. Mitral Valve: The mitral valve is normal in structure. Normal mobility of the mitral valve leaflets. Mild to  moderate mitral valve regurgitation. No evidence of mitral valve stenosis. Tricuspid Valve: The tricuspid valve is normal in structure. Tricuspid valve regurgitation is moderate to severe. No evidence of tricuspid stenosis. Aortic Valve: The aortic valve is normal in structure. Aortic valve regurgitation is mild. No aortic stenosis is present. Aortic valve mean gradient measures 2.3 mmHg. Aortic valve peak gradient measures 4.4 mmHg. Aortic valve area, by VTI measures 1.94 cm. Pulmonic Valve: The pulmonic valve was normal in structure. Pulmonic valve regurgitation is not visualized. No evidence of pulmonic stenosis. Aorta: The aortic root is normal in size and structure. Venous: The inferior vena cava is normal in size with greater than 50% respiratory variability, suggesting right atrial pressure of 3 mmHg. IAS/Shunts: No atrial level shunt detected by color flow Doppler. Additional Comments: A pacer wire is visualized.  LEFT VENTRICLE PLAX 2D LVIDd:         3.46 cm  Diastology LVIDs:         2.35 cm  LV e' lateral:   8.49 cm/s LV PW:         1.22 cm  LV E/e' lateral: 14.4 LV IVS:        0.90 cm  LV e' medial:    7.40 cm/s LVOT diam:     2.00 cm  LV E/e' medial:  16.5 LV SV:         41 LV SV Index:   25 LVOT Area:     3.14 cm  RIGHT VENTRICLE RV Basal diam:  3.05 cm RV S prime:     8.49 cm/s TAPSE (M-mode): 2.6 cm LEFT ATRIUM              Index       RIGHT ATRIUM           Index LA diam:        4.40 cm  2.67 cm/m  RA Area:     37.90 cm LA Vol (A2C):   121.0 ml 73.38 ml/m RA Volume:   161.00 ml 97.64 ml/m LA Vol (A4C):   72.8 ml  44.15 ml/m LA Biplane Vol: 96.8 ml  58.70 ml/m  AORTIC VALVE                   PULMONIC VALVE AV Area (Vmax):    1.93 cm  PV Vmax:        0.50 m/s AV Area (Vmean):   1.70 cm    PV Peak grad:   1.0 mmHg AV Area (VTI):     1.94 cm    RVOT Peak grad: 1 mmHg AV Vmax:           104.57 cm/s AV Vmean:          73.700 cm/s AV VTI:            0.212 m AV Peak Grad:      4.4 mmHg AV Mean  Grad:      2.3 mmHg LVOT Vmax:         64.30 cm/s LVOT Vmean:        39.900 cm/s LVOT VTI:          0.131 m LVOT/AV VTI ratio: 0.62  AORTA Ao Root diam: 2.90 cm MITRAL VALVE                TRICUSPID VALVE MV Area (PHT): 3.00 cm     TR Peak grad:   21.0 mmHg MV Decel Time: 253 msec     TR Vmax:        229.00 cm/s MV E velocity: 122.00 cm/s                             SHUNTS                             Systemic VTI:  0.13 m                             Systemic Diam: 2.00 cm Marcina Millard Carson Electronically signed by Marcina Millard Carson Signature Date/Time: 11/30/2019/1:42:59 PM    Final        Subjective: Patient seen this AM.  No acute events.  Denies fever/chills, chest pain.  SOB she says is much better.  Feels ready to go home.   Discharge Exam: Vitals:   11/30/19 0901 11/30/19 1207  BP: 131/87 118/77  Pulse: 71 68  Resp: 18 18  Temp: (!) 97.4 F (36.3 C)   SpO2: 98% 99%   Vitals:   11/29/19 1940 11/30/19 0338 11/30/19 0901 11/30/19 1207  BP: 138/80 102/78 131/87 118/77  Pulse: 80 60 71 68  Resp: 20 18 18 18   Temp: 97.8 F (36.6 C) (!) 97.2 F (36.2 C) (!) 97.4 F (36.3 C)   TempSrc: Oral Oral Oral   SpO2: 100% 97% 98% 99%  Weight:  62.8 kg    Height:        General: Carson is alert, awake, not in acute distress Cardiovascular: RRR, S1/S2 +, no rubs, no gallops Respiratory: CTA bilaterally, no wheezing, no rhonchi Abdominal: Soft, NT, ND, bowel sounds + Extremities: no edema, no cyanosis    The results of significant diagnostics from this hospitalization (including imaging, microbiology, ancillary and laboratory) are listed below for reference.     Microbiology: No results found for this or any previous visit (from the past 240 hour(s)).   Labs: BNP (last 3 results) Recent Labs    06/21/19 1013 11/28/19 1848  BNP 334.0* 461.0*   Basic Metabolic Panel: No results for input(s): NA, K, CL, CO2, GLUCOSE, BUN, CREATININE, CALCIUM, MG, PHOS in the last 168  hours. Liver Function Tests: No results for input(s):  AST, ALT, ALKPHOS, BILITOT, PROT, ALBUMIN in the last 168 hours. No results for input(s): LIPASE, AMYLASE in the last 168 hours. No results for input(s): AMMONIA in the last 168 hours. CBC: No results for input(s): WBC, NEUTROABS, HGB, HCT, MCV, PLT in the last 168 hours. Cardiac Enzymes: No results for input(s): CKTOTAL, CKMB, CKMBINDEX, TROPONINI in the last 168 hours. BNP: Invalid input(s): POCBNP CBG: No results for input(s): GLUCAP in the last 168 hours. D-Dimer No results for input(s): DDIMER in the last 72 hours. Hgb A1c No results for input(s): HGBA1C in the last 72 hours. Lipid Profile No results for input(s): CHOL, HDL, LDLCALC, TRIG, CHOLHDL, LDLDIRECT in the last 72 hours. Thyroid function studies No results for input(s): TSH, T4TOTAL, T3FREE, THYROIDAB in the last 72 hours.  Invalid input(s): FREET3 Anemia work up No results for input(s): VITAMINB12, FOLATE, FERRITIN, TIBC, IRON, RETICCTPCT in the last 72 hours. Urinalysis    Component Value Date/Time   COLORURINE YELLOW (A) 06/21/2019 1114   APPEARANCEUR CLEAR (A) 06/21/2019 1114   APPEARANCEUR Hazy 01/10/2015 1349   LABSPEC 1.005 06/21/2019 1114   LABSPEC 1.026 01/10/2015 1349   PHURINE 7.0 06/21/2019 1114   GLUCOSEU NEGATIVE 06/21/2019 1114   GLUCOSEU Negative 01/10/2015 1349   HGBUR NEGATIVE 06/21/2019 1114   BILIRUBINUR NEGATIVE 06/21/2019 1114   BILIRUBINUR Negative 01/10/2015 1349   KETONESUR NEGATIVE 06/21/2019 1114   PROTEINUR NEGATIVE 06/21/2019 1114   NITRITE NEGATIVE 06/21/2019 1114   LEUKOCYTESUR NEGATIVE 06/21/2019 1114   LEUKOCYTESUR 1+ 01/10/2015 1349   Sepsis Labs Invalid input(s): PROCALCITONIN,  WBC,  LACTICIDVEN Microbiology No results found for this or any previous visit (from the past 240 hour(s)).   Time coordinating discharge: Over 30 minutes  SIGNED:   Pennie Banter, DO [facility] 12/15/2019, 4:27 PM   If  7PM-7AM, please contact night-coverage [email or web address]

## 2019-12-15 NOTE — Progress Notes (Signed)
Patient ID: Colleen Carson, female    DOB: Jun 28, 1925, 86 y.o.   MRN: [id number]  HPI   Ms Bartley is a 86 y/o female with a history of arthritis, atrial fibrillation, hyperlipidemia, HTN, lymphedema, shingles, CVA and chronic heart failure.  Echo report from 11/29/19 reviewed and showed an EF of 40-45% along with mild/moderate MR, moderate/ severe TR and mildly elevated PA pressure. Reviewed echo report done 01/16/18 which showed an EF of 55-65% along with moderate MR and moderate/severe TR. Reviewed echo report done 02/28/17 which showed an EF of 40-45% along with moderate AS, mild MR and mild/mod TR. PA pressure at 31 mm Hg.   Admitted 11/28/19 due to acute on chronic HF. Initially given IV lasix and then transitioned to oral diuretics. Discharged after 2 days. Was in the ED 06/21/2019 due to acute on chronic HF and fatigue where she was treated and released.   She presents today for her follow-up visit with a chief complaint of minimal shortness of breath upon moderate exertion. She describes this as chronic in nature having been present for several years. She has associated pedal edema along with this although does say that her edema has improved since her diuretic was changed to torsemide. She denies any difficulty sleeping, abdominal distention, palpitations, chest pain, cough, dizziness, fatigue or weight gain.   Past Medical History:  Diagnosis Date  . Arthritis   . Atrial fibrillation (HCC)   . CHF (congestive heart failure) (HCC)   . Dysrhythmia    Afib  . Hypercholesteremia   . Hypertension   . Lymphedema    lower extremities  . S/P total hip arthroplasty 01/25/2015  . Shingles   . Stroke [facility])    noted ischemia on CT scan   Past Surgical History:  Procedure Laterality Date  . JOINT REPLACEMENT  01/23/15   Left THR Dr. Rosita Kea   . PACEMAKER INSERTION  2017  . TOTAL HIP ARTHROPLASTY Left 01/23/2015   Procedure: TOTAL HIP ARTHROPLASTY ANTERIOR APPROACH;  Surgeon: Kennedy Bucker, MD;   Location: [facility] ORS;  Service: Orthopedics;  Laterality: Left;   Family History  Problem Relation Age of Onset  . Hypertension Other    Social History   Tobacco Use  . Smoking status: Never Smoker  . Smokeless tobacco: Never Used  Substance Use Topics  . Alcohol use: No   Allergies  Allergen Reactions  . Morphine Nausea Only    Hallucinations, nausea, vomitting  . Morphine And Related Nausea And Vomiting   Prior to Admission medications   Medication Sig Start Date End Date Taking? Authorizing Provider  apixaban (ELIQUIS) 5 MG TABS tablet Take 5 mg by mouth 2 (two) times daily.   Yes [provider]  atorvastatin (LIPITOR) 40 MG tablet Take 1 tablet (40 mg total) by mouth at bedtime. 01/16/18  Yes Mody, Patricia Pesa, MD  cetirizine (ZYRTEC) 10 MG tablet Take 10 mg by mouth daily.   Yes [provider]  metoprolol tartrate (LOPRESSOR) 50 MG tablet Take 50 mg by mouth 2 (two) times daily.   Yes [provider]  Multiple Vitamins-Minerals (CENTRUM SILVER 50+WOMEN PO) Take 1 tablet by mouth daily.   Yes [provider]  torsemide (DEMADEX) 20 MG tablet Take 2 tablets (40 mg total) by mouth daily. 12/07/19 01/06/20 Yes Delma Freeze, FNP    Review of Systems  Constitutional: Negative for appetite change and fatigue.  HENT: Negative for congestion, postnasal drip and sore throat.   Eyes: Negative.  Respiratory: Positive for shortness of breath (with moderate exertion). Negative for cough and chest tightness.   Cardiovascular: Positive for leg swelling (improving). Negative for chest pain and palpitations.  Gastrointestinal: Negative for abdominal distention and abdominal pain.  Endocrine: Negative.   Genitourinary: Negative.   Musculoskeletal: Negative for back pain and neck pain.  Skin: Negative.   Allergic/Immunologic: Negative.   Neurological: Negative for dizziness, weakness and light-headedness.  Hematological: Negative for adenopathy. Does not  bruise/bleed easily.  Psychiatric/Behavioral: Negative for dysphoric mood and sleep disturbance (sleeping on 1 pillow). The patient is nervous/anxious.    Vitals:   12/16/19 1201  BP: 115/67  Pulse: 86  Resp: 16  SpO2: 100%  Weight: 141 lb 6.4 oz (64.1 kg)  Height: 5\' 1"  (1.549 m)   Wt Readings from Last 3 Encounters:  12/16/19 141 lb 6.4 oz (64.1 kg)  12/07/19 144 lb 2 oz (65.4 kg)  11/30/19 138 lb 6.4 oz (62.8 kg)   Lab Results  Component Value Date   CREATININE 1.14 (H) 11/30/2019   CREATININE 1.07 (H) 11/29/2019   CREATININE 1.24 (H) 11/28/2019     Physical Exam  Constitutional: She is oriented to person, place, and time. She appears well-developed and well-nourished.  HENT:  Head: Normocephalic and atraumatic.  Neck: No JVD present.  Cardiovascular: Normal rate and regular rhythm.  Pulmonary/Chest: Effort normal. She has no wheezes. She has no rales.  Abdominal: Soft. She exhibits no distension. There is no abdominal tenderness.  Musculoskeletal:        General: Edema (1+ pitting bilateral) present. No tenderness.     Cervical back: Normal range of motion and neck supple.  Neurological: She is alert and oriented to person, place, and time.  Skin: Skin is warm and dry.  Psychiatric: She has a normal mood and affect. Her behavior is normal. Thought content normal.  Nursing note and vitals reviewed.   Assessment & Plan:  1: Chronic heart failure with preserved ejection fraction- - NYHA class II - euvolemic today - weighing daily; reminded to call for an overnight weight gain of >2 pounds or a weekly weight gain of >5 pounds - weight down 3 pounds from last visit 10 days ago - will check BMP today due to starting torsemide 40mg  daily at her last visit - not adding salt to her food and her daughter is reading food labels and cooking foods from scratch. Reviewed the importance of closely following a 2000mg  sodium diet - encouraged her to drink 40-50 ounces of fluid  daily - saw cardiologist Ubaldo Glassing) 12/05/19 - getting PT at home twice / week - BNP 11/28/19 was 461.0 - has received her flu vaccine  2: HTN- - BP looks good today - saw PCP Baldemar Lenis) 12/08/19 - BMP from 11/30/19 reviewed and showed sodium 141, potassium 3.6, creatinine 1.14 and GFR 42  3: Lymphedema- - stage 2 - does exercise by doing quite a bit of walking - has worn compression socks in the past but says that they hurt her legs; advised her to try a larger size - does elevate her legs when she's sitting for long periods of time with improvement in her edema -tried wearing compression boots again but says that she felt nauseous and had a low BP afterwards - she does feel like her swelling is better since having her diuretic changed   Medication bottles were reviewed.   Return in 4 months or sooner for any questions/problems before then.   ADDENDUM: Lab results were received  prior to closing this note. Advised patient and her daughter that due to worsening renal function, to decrease her torsemide down to 20mg  daily (was taking 40mg  daily). Will recheck BMP in 1 week. Patient and daughter verbalized understanding.

## 2019-12-16 ENCOUNTER — Encounter: Payer: Self-pay | Admitting: Family

## 2019-12-16 ENCOUNTER — Other Ambulatory Visit: Payer: Self-pay

## 2019-12-16 ENCOUNTER — Ambulatory Visit: Payer: Medicare Other | Attending: Family | Admitting: Family

## 2019-12-16 VITALS — BP 115/67 | HR 86 | Resp 16 | Ht 61.0 in | Wt 141.4 lb

## 2019-12-16 DIAGNOSIS — I890 Lymphedema, not elsewhere classified: Secondary | ICD-10-CM | POA: Diagnosis not present

## 2019-12-16 DIAGNOSIS — Z7901 Long term (current) use of anticoagulants: Secondary | ICD-10-CM | POA: Diagnosis not present

## 2019-12-16 DIAGNOSIS — Z8673 Personal history of transient ischemic attack (TIA), and cerebral infarction without residual deficits: Secondary | ICD-10-CM | POA: Insufficient documentation

## 2019-12-16 DIAGNOSIS — I10 Essential (primary) hypertension: Secondary | ICD-10-CM

## 2019-12-16 DIAGNOSIS — Z950 Presence of cardiac pacemaker: Secondary | ICD-10-CM | POA: Diagnosis not present

## 2019-12-16 DIAGNOSIS — M1990 Unspecified osteoarthritis, unspecified site: Secondary | ICD-10-CM | POA: Diagnosis not present

## 2019-12-16 DIAGNOSIS — E7800 Pure hypercholesterolemia, unspecified: Secondary | ICD-10-CM | POA: Insufficient documentation

## 2019-12-16 DIAGNOSIS — E785 Hyperlipidemia, unspecified: Secondary | ICD-10-CM | POA: Diagnosis not present

## 2019-12-16 DIAGNOSIS — I110 Hypertensive heart disease with heart failure: Secondary | ICD-10-CM | POA: Insufficient documentation

## 2019-12-16 DIAGNOSIS — Z79899 Other long term (current) drug therapy: Secondary | ICD-10-CM | POA: Diagnosis not present

## 2019-12-16 DIAGNOSIS — I5032 Chronic diastolic (congestive) heart failure: Secondary | ICD-10-CM

## 2019-12-16 DIAGNOSIS — R0602 Shortness of breath: Secondary | ICD-10-CM | POA: Insufficient documentation

## 2019-12-16 LAB — BASIC METABOLIC PANEL
Anion gap: 12 (ref 5–15)
BUN: 50 mg/dL — ABNORMAL HIGH (ref 8–23)
CO2: 33 mmol/L — ABNORMAL HIGH (ref 22–32)
Calcium: 9.3 mg/dL (ref 8.9–10.3)
Chloride: 97 mmol/L — ABNORMAL LOW (ref 98–111)
Creatinine, Ser: 1.31 mg/dL — ABNORMAL HIGH (ref 0.44–1.00)
GFR calc Af Amer: 41 mL/min — ABNORMAL LOW (ref >60)
GFR calc non Af Amer: 35 mL/min — ABNORMAL LOW (ref >60)
Glucose, Bld: 138 mg/dL — ABNORMAL HIGH (ref 70–99)
Potassium: 3.4 mmol/L — ABNORMAL LOW (ref 3.5–5.1)
Sodium: 142 mmol/L (ref 135–145)

## 2019-12-16 NOTE — Patient Instructions (Signed)
Continue weighing daily and call for an overnight weight gain of > 2 pounds or a weekly weight gain of >5 pounds. 

## 2019-12-20 ENCOUNTER — Other Ambulatory Visit: Payer: Self-pay | Admitting: Family

## 2019-12-23 ENCOUNTER — Telehealth: Payer: Self-pay | Admitting: Family

## 2019-12-23 ENCOUNTER — Other Ambulatory Visit: Payer: Self-pay

## 2019-12-23 ENCOUNTER — Other Ambulatory Visit
Admission: RE | Admit: 2019-12-23 | Discharge: 2019-12-23 | Disposition: A | Discharge status: Home / Self Care | Payer: Medicare Other | Source: Ambulatory Visit | Attending: Family | Admitting: Family

## 2019-12-23 DIAGNOSIS — I5032 Chronic diastolic (congestive) heart failure: Secondary | ICD-10-CM | POA: Diagnosis present

## 2019-12-23 LAB — BASIC METABOLIC PANEL
Anion gap: 11 (ref 5–15)
BUN: 35 mg/dL — ABNORMAL HIGH (ref 8–23)
CO2: 33 mmol/L — ABNORMAL HIGH (ref 22–32)
Calcium: 9.2 mg/dL (ref 8.9–10.3)
Chloride: 98 mmol/L (ref 98–111)
Creatinine, Ser: 1.24 mg/dL — ABNORMAL HIGH (ref 0.44–1.00)
GFR calc Af Amer: 44 mL/min — ABNORMAL LOW (ref >60)
GFR calc non Af Amer: 38 mL/min — ABNORMAL LOW (ref >60)
Glucose, Bld: 117 mg/dL — ABNORMAL HIGH (ref 70–99)
Potassium: 3.2 mmol/L — ABNORMAL LOW (ref 3.5–5.1)
Sodium: 142 mmol/L (ref 135–145)

## 2019-12-23 NOTE — Telephone Encounter (Signed)
Spoke with patient's daughter, Raynelle Fanning, over speakerphone with the patient also listening regarding patient's lab work that was drawn today. Renal function is slowly improving although potassium level remains low.   Daughter says that patient has OTC potassium at home that she used to take. Explained that 1 OTC potassium may not be enough but we could try that and then recheck it when she returns from [place], [place] in ~ 1 month. Raynelle Fanning says that the dosage is 200mg  and she'll put 1 daily in patient's pillbox. Patient also likes to eat bananas.   to let me know when patient returns and we can recheck her lab work at that time. Patient and daughter were both comfortable with this plan.

## 2020-01-10 ENCOUNTER — Ambulatory Visit: Payer: Medicare Other | Admitting: Dermatology

## 2020-01-23 ENCOUNTER — Other Ambulatory Visit: Payer: Self-pay | Admitting: Family

## 2020-01-24 ENCOUNTER — Encounter
Admission: RE | Admit: 2020-01-24 | Discharge: 2020-01-24 | Disposition: A | Discharge status: Home / Self Care | Payer: Medicare Other | Source: Ambulatory Visit | Attending: Family | Admitting: Family

## 2020-01-24 ENCOUNTER — Other Ambulatory Visit: Payer: Self-pay

## 2020-01-24 DIAGNOSIS — Z01812 Encounter for preprocedural laboratory examination: Secondary | ICD-10-CM | POA: Diagnosis present

## 2020-01-24 LAB — BASIC METABOLIC PANEL
Anion gap: 10 (ref 5–15)
BUN: 37 mg/dL — ABNORMAL HIGH (ref 8–23)
CO2: 32 mmol/L (ref 22–32)
Calcium: 9.3 mg/dL (ref 8.9–10.3)
Chloride: 97 mmol/L — ABNORMAL LOW (ref 98–111)
Creatinine, Ser: 1.35 mg/dL — ABNORMAL HIGH (ref 0.44–1.00)
GFR calc Af Amer: 39 mL/min — ABNORMAL LOW (ref >60)
GFR calc non Af Amer: 34 mL/min — ABNORMAL LOW (ref >60)
Glucose, Bld: 151 mg/dL — ABNORMAL HIGH (ref 70–99)
Potassium: 3.7 mmol/L (ref 3.5–5.1)
Sodium: 139 mmol/L (ref 135–145)

## 2020-02-23 IMAGING — CT CT HEAD W/O CM
3 series · 15 of 46 positions shown, 18 images · non-contrast
Comparison: January 15, 2018

CLINICAL DATA: Headache with nausea.  Lethargy

EXAM:
CT HEAD WITHOUT CONTRAST
TECHNIQUE: Contiguous axial images were obtained from the base of the skull
through the vertex without intravenous contrast.

[Series 2: head wo · axial · 0.47mm/px · z∈[-191,-71]mm · 9 of 29 slices shown, 12 images]
[im 3/29  brain]
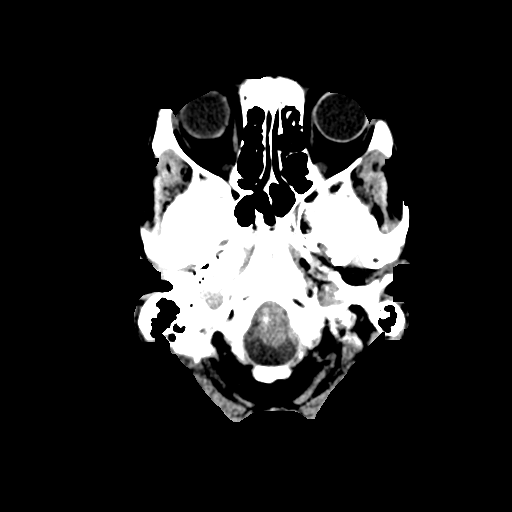
[im 3/29  bone]
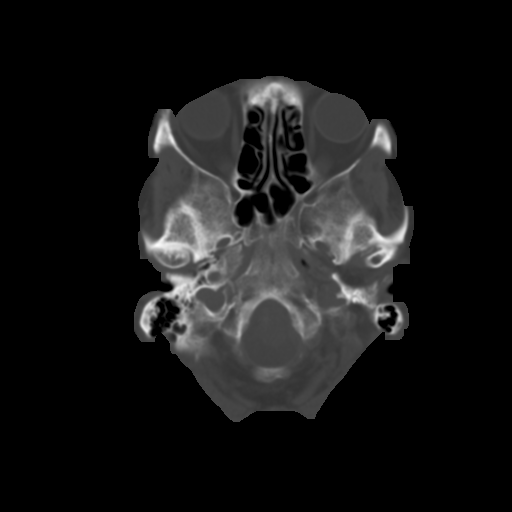
[im 6/29  brain]
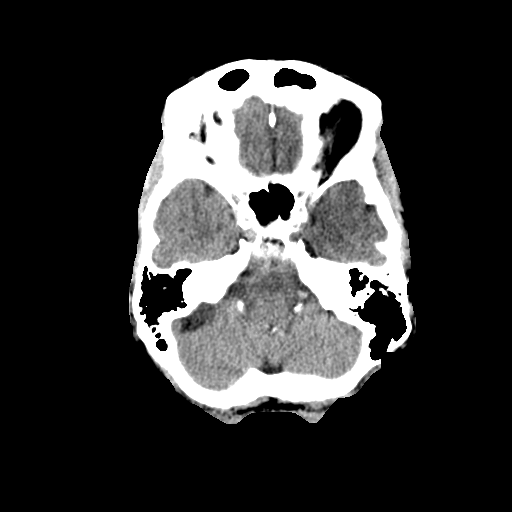
[im 9/29  brain]
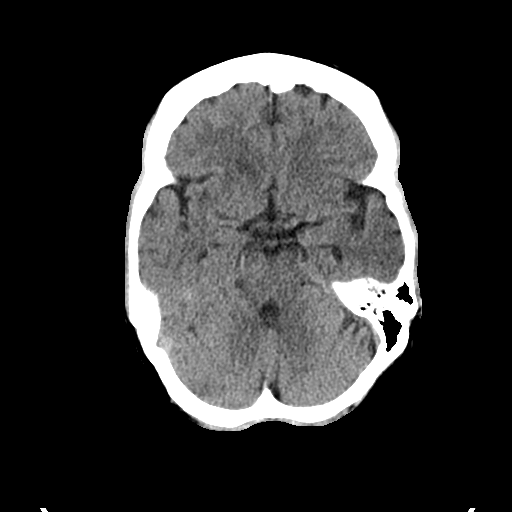
[im 12/29  brain]
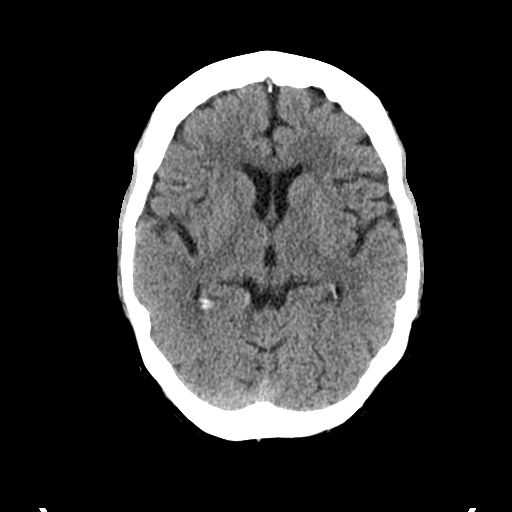
[im 15/29  brain]
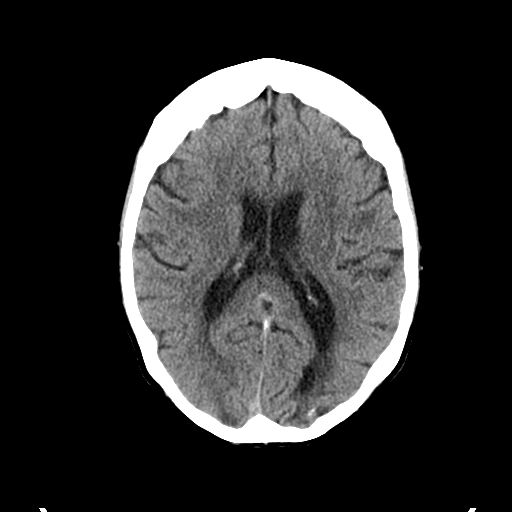
[im 15/29  bone]
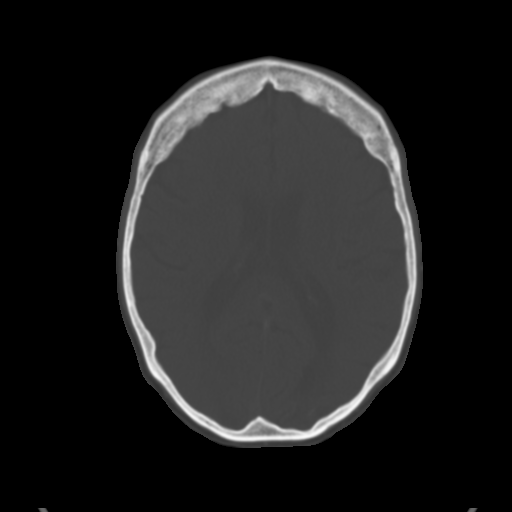
[im 18/29  brain]
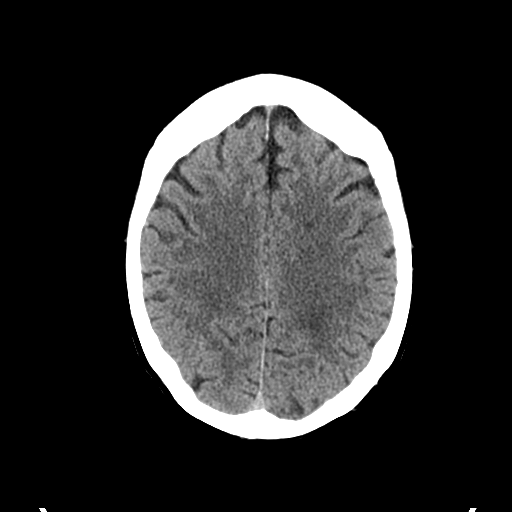
[im 21/29  brain]
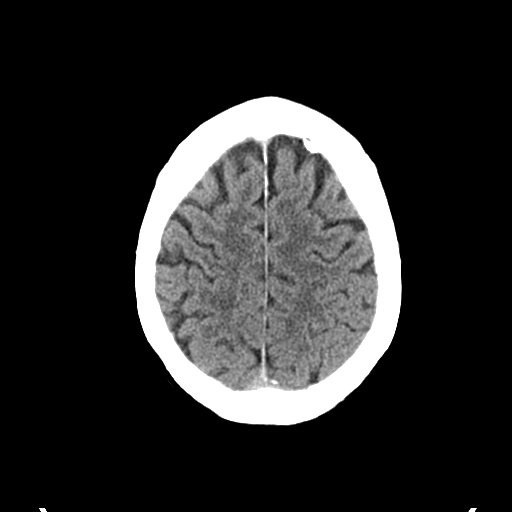
[im 24/29  brain]
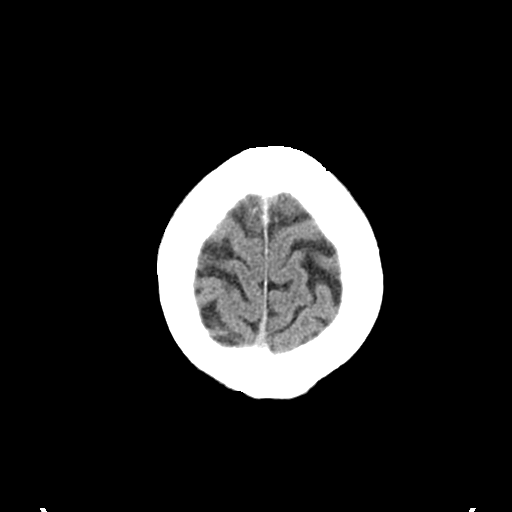
[im 27/29  brain]
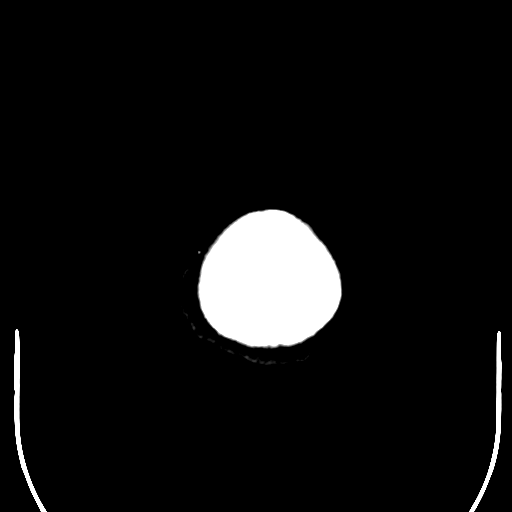
[im 27/29  bone]
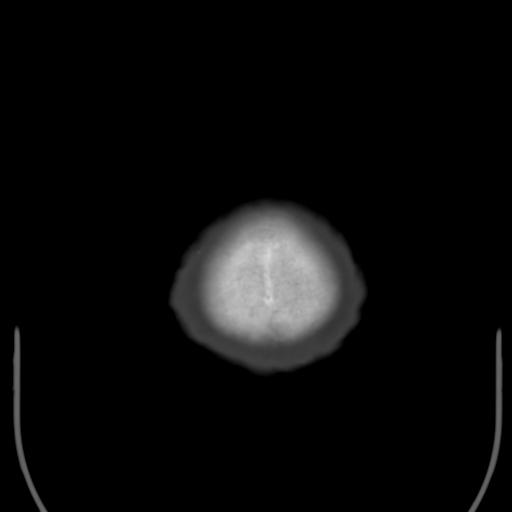

[Series 4: coronal soft tissue · coronal · 0.29mm/px · 3 of 62 slices shown]
[im 21/62  brain]
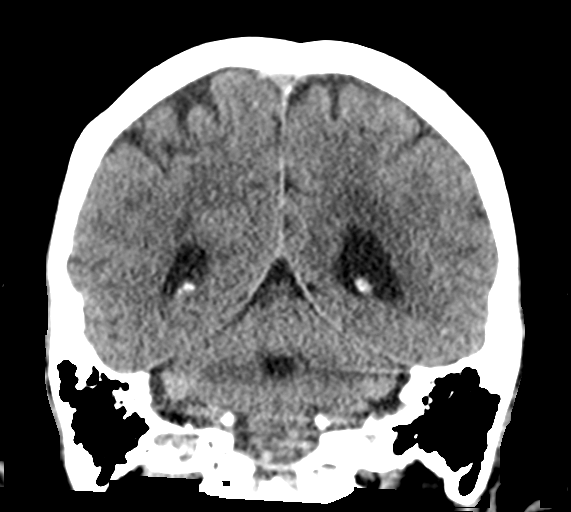
[im 28/62  brain]
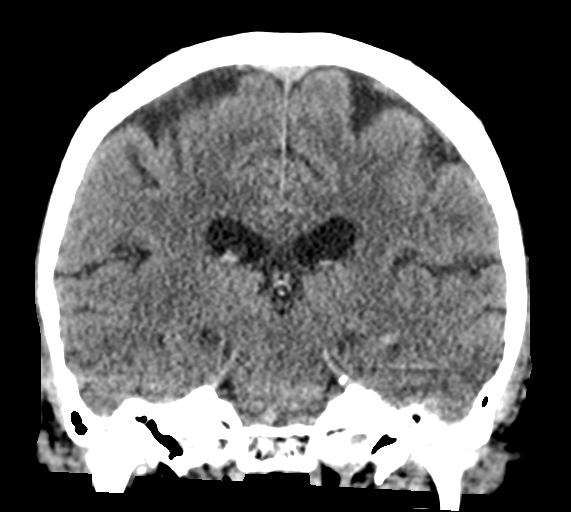
[im 34/62  brain]
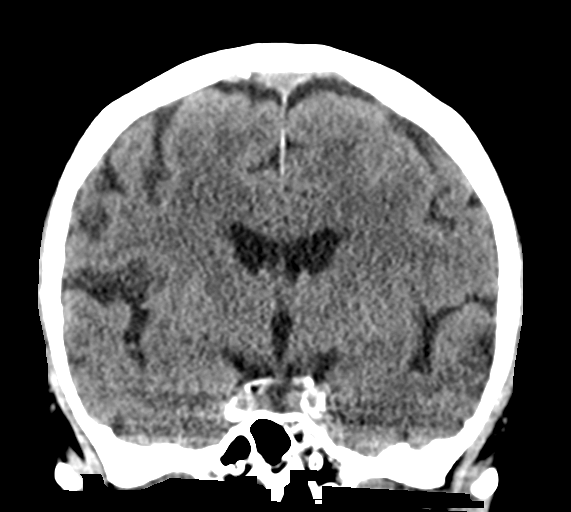

[Series 5: sagittal soft tissue · sagittal · 0.28mm/px · 3 of 49 slices shown]
[im 17/49  brain]
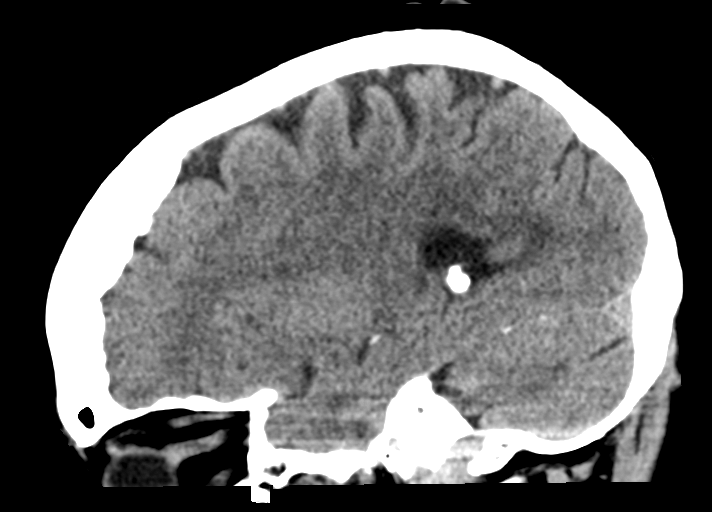
[im 25/49  brain]
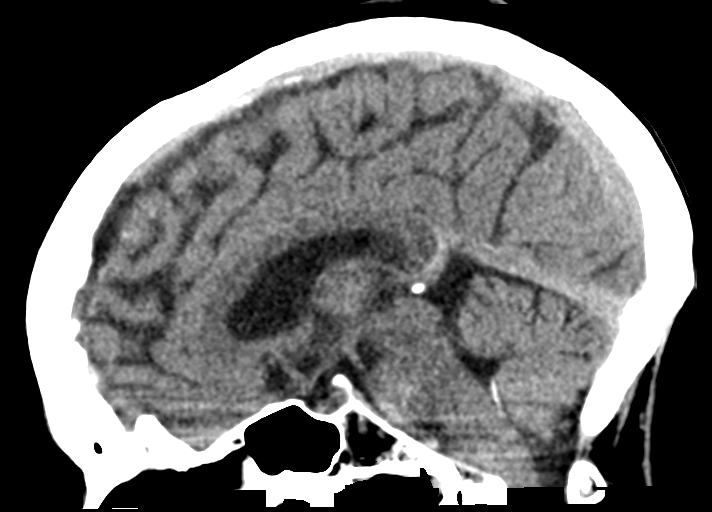
[im 33/49  brain]
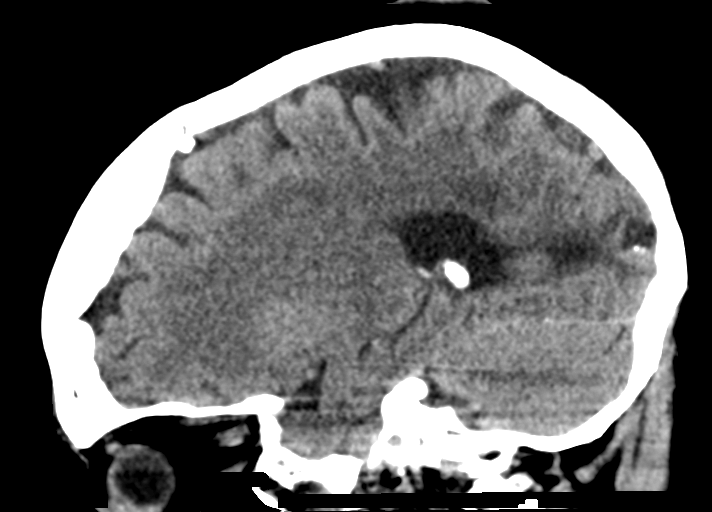

[15 of 46 positions shown; findings below may reference images not displayed]

FINDINGS: Brain: The ventricles and sulci appear within normal limits for age.
There is no intracranial mass, hemorrhage, extra-axial fluid
collection, or midline shift. There is evidence of a prior infarct
in the mid left occipital lobe, stable. There is mild patchy small
vessel disease in the centra semiovale bilaterally, stable. There is
no evident acute infarct.

Vascular: There is no hyperdense vessel. There is calcification in
each carotid siphon region.

Skull: The bony calvarium appears intact. There is a small bony
fragment slightly posterior and to the right of the superior
odontoid, stable from previous study.

Sinuses/Orbits: There is mucosal thickening in multiple ethmoid air
cells. There is also mucosal thickening in the superior right
maxillary antrum. Visualized orbits appear symmetric bilaterally.

Other: Mastoid air cells are clear.
IMPRESSION: Prior, stable infarct in the mid left occipital lobe. Mild
periventricular small vessel disease. No acute infarct. No mass or
hemorrhage.

There are foci of arterial vascular calcification.

There is mucosal thickening in several paranasal sinuses. A small
focus of bone slightly posterior to the superior odontoid is chronic
and stable in appearance.

## 2020-02-23 IMAGING — CR DG CHEST 2V
2 series · 2 of 2 positions shown · non-contrast
Comparison: 01/15/2018

CLINICAL DATA: Malaise after waking up. Patient noted a 3 pound
weight gain since yesterday.

EXAM:
CHEST - 2 VIEW

[chest lat]
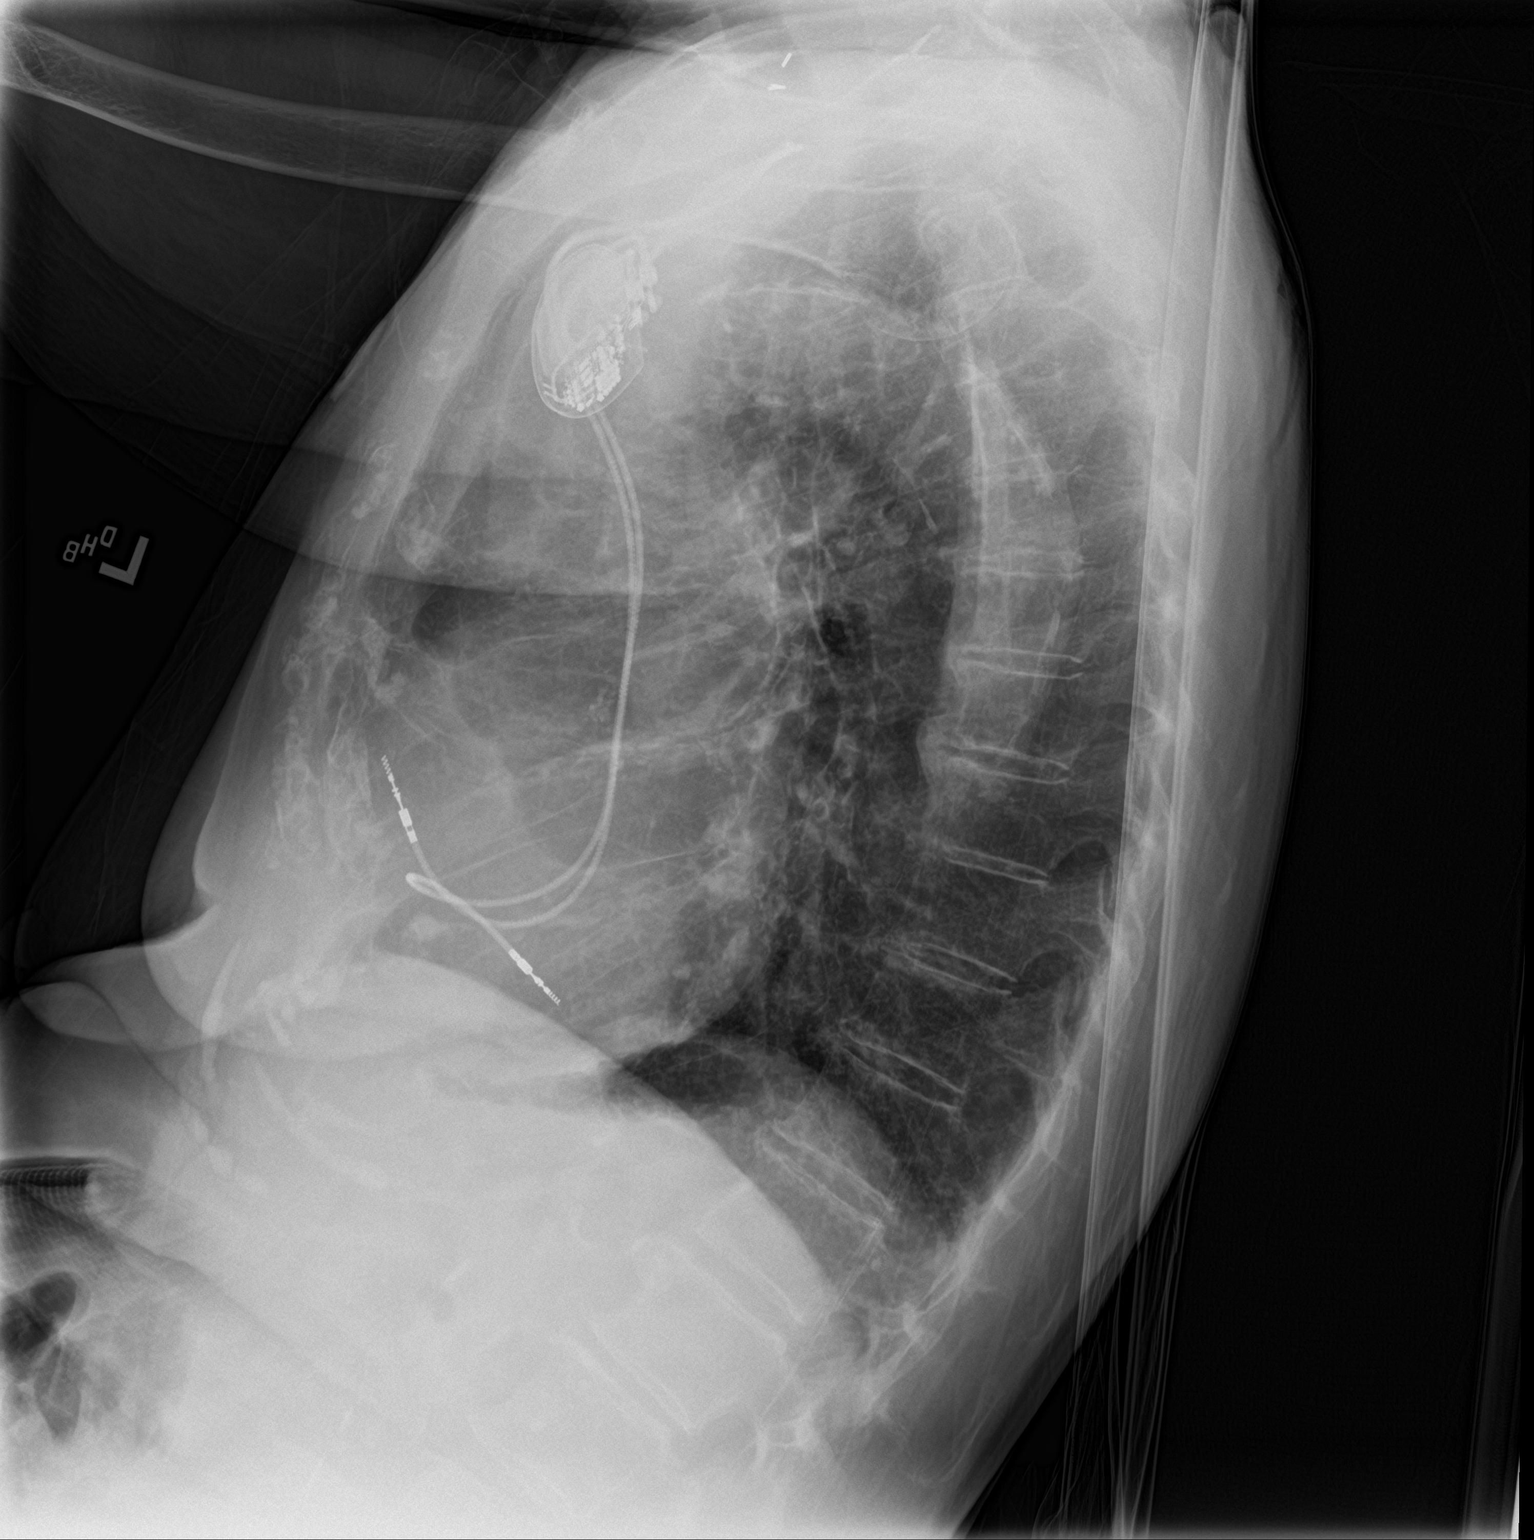

[chest ap]
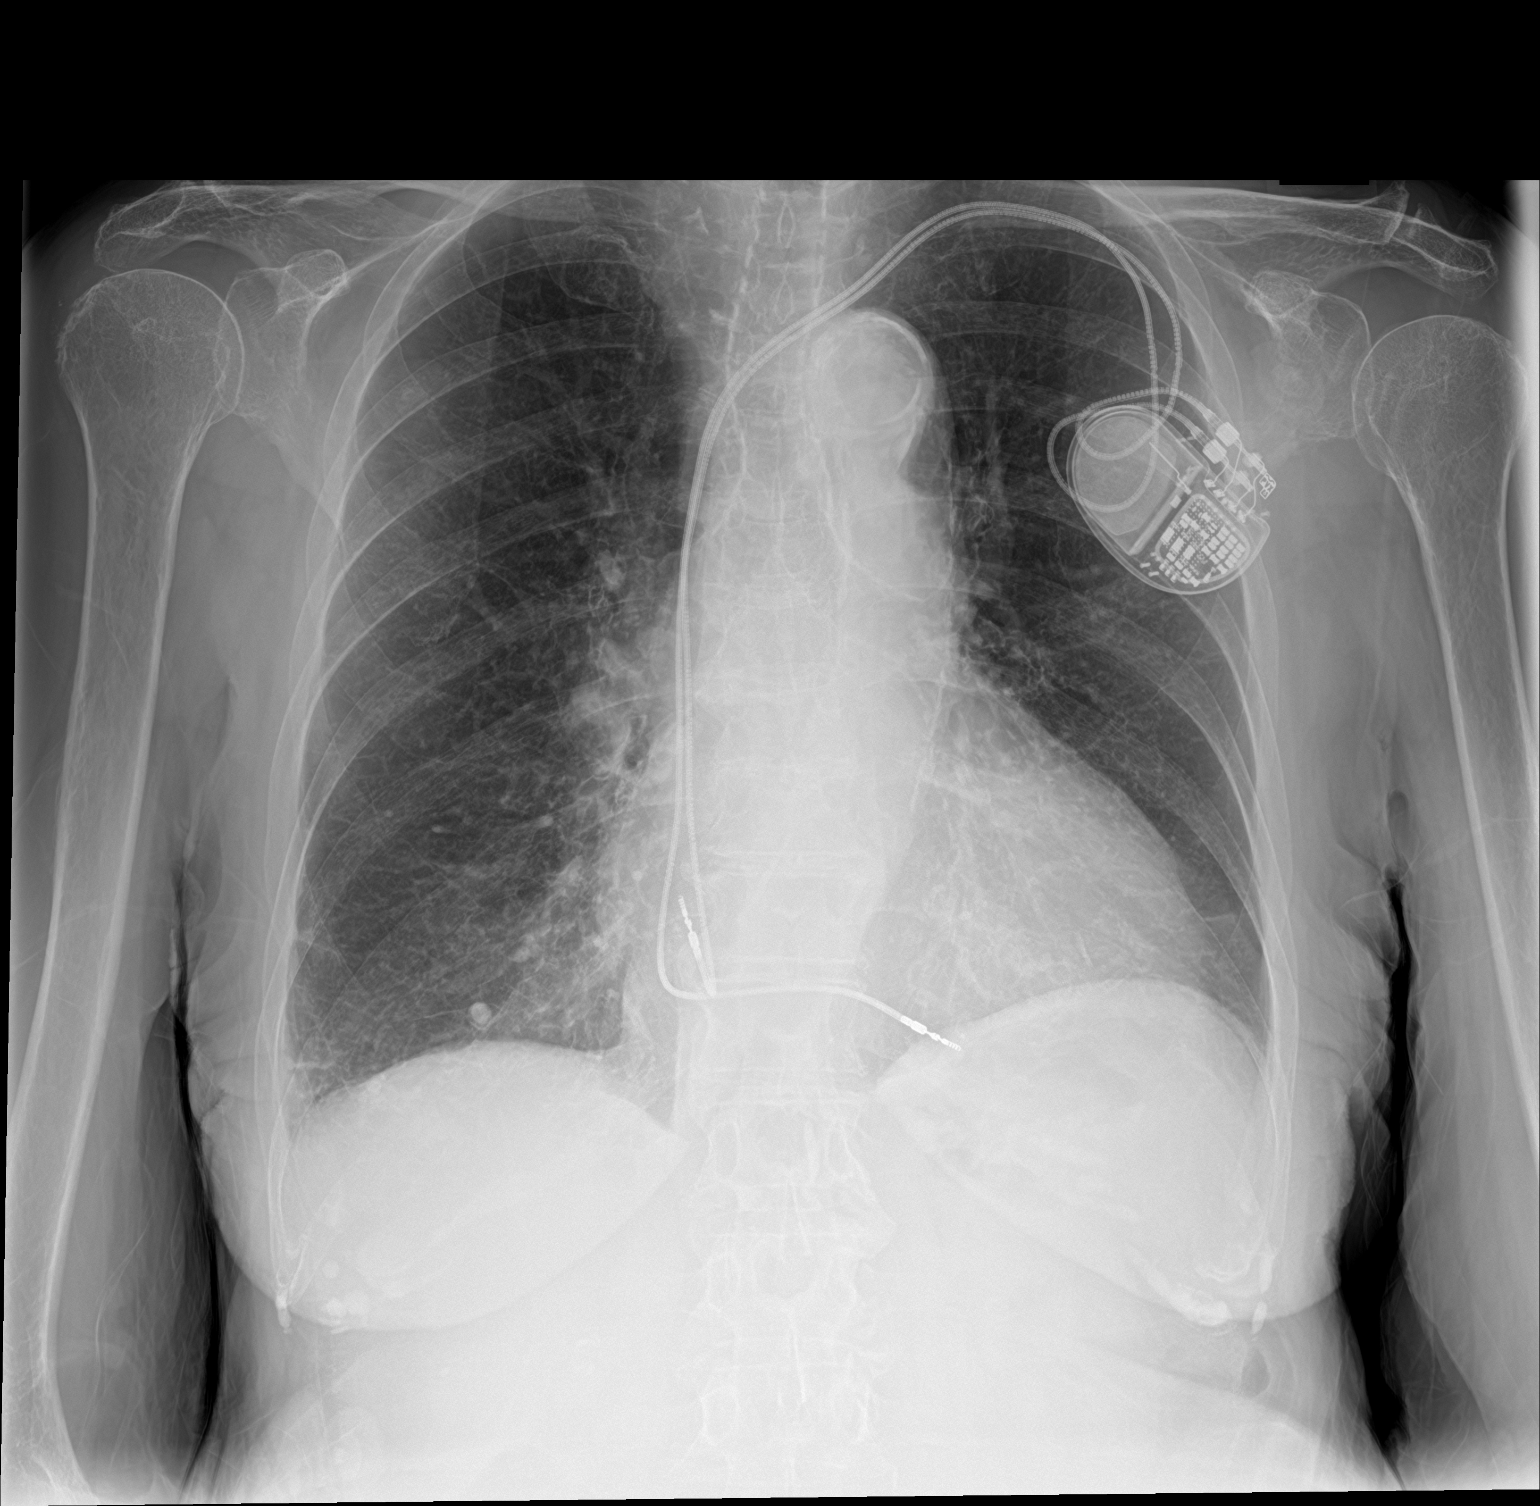

[2 of 2 positions shown; findings below may reference images not displayed]

FINDINGS: Stable enlarged cardiac silhouette. Moderate aortic atherosclerosis
with ectasia. Calcified nodule at the right lung base consistent
with a granuloma. No overt pulmonary edema, effusion or
pneumothorax. Left-sided pacemaker apparatus with right atrial and
right ventricular leads are stable in appearance. No acute osseous
abnormality.
IMPRESSION: Stable cardiomegaly with aortic atherosclerosis. No overt pulmonary
edema, effusion or pulmonary consolidation.

## 2020-04-01 ENCOUNTER — Emergency Department: Payer: Medicare Other

## 2020-04-01 ENCOUNTER — Other Ambulatory Visit: Payer: Self-pay

## 2020-04-01 ENCOUNTER — Emergency Department
Admission: EM | Admit: 2020-04-01 | Discharge: 2020-04-01 | Disposition: A | Discharge status: Home / Self Care | Payer: Medicare Other | Attending: Emergency Medicine | Admitting: Emergency Medicine

## 2020-04-01 DIAGNOSIS — Z7901 Long term (current) use of anticoagulants: Secondary | ICD-10-CM | POA: Insufficient documentation

## 2020-04-01 DIAGNOSIS — Z79899 Other long term (current) drug therapy: Secondary | ICD-10-CM | POA: Diagnosis not present

## 2020-04-01 DIAGNOSIS — G459 Transient cerebral ischemic attack, unspecified: Secondary | ICD-10-CM | POA: Insufficient documentation

## 2020-04-01 DIAGNOSIS — I5033 Acute on chronic diastolic (congestive) heart failure: Secondary | ICD-10-CM | POA: Insufficient documentation

## 2020-04-01 DIAGNOSIS — Z96652 Presence of left artificial knee joint: Secondary | ICD-10-CM | POA: Insufficient documentation

## 2020-04-01 DIAGNOSIS — R42 Dizziness and giddiness: Secondary | ICD-10-CM | POA: Diagnosis present

## 2020-04-01 DIAGNOSIS — I110 Hypertensive heart disease with heart failure: Secondary | ICD-10-CM | POA: Diagnosis not present

## 2020-04-01 DIAGNOSIS — R079 Chest pain, unspecified: Secondary | ICD-10-CM

## 2020-04-01 DIAGNOSIS — H81391 Other peripheral vertigo, right ear: Secondary | ICD-10-CM | POA: Diagnosis not present

## 2020-04-01 DIAGNOSIS — I4821 Permanent atrial fibrillation: Secondary | ICD-10-CM | POA: Diagnosis not present

## 2020-04-01 DIAGNOSIS — Z96642 Presence of left artificial hip joint: Secondary | ICD-10-CM | POA: Diagnosis not present

## 2020-04-01 LAB — CBC
HCT: 40.5 % (ref 36.0–46.0)
Hemoglobin: 13.4 g/dL (ref 12.0–15.0)
MCH: 29.5 pg (ref 26.0–34.0)
MCHC: 33.1 g/dL (ref 30.0–36.0)
MCV: 89.2 fL (ref 80.0–100.0)
Platelets: 136 10*3/uL — ABNORMAL LOW (ref 150–400)
RBC: 4.54 MIL/uL (ref 3.87–5.11)
RDW: 16.6 % — ABNORMAL HIGH (ref 11.5–15.5)
WBC: 6.2 10*3/uL (ref 4.0–10.5)
nRBC: 0 % (ref 0.0–0.2)

## 2020-04-01 LAB — BASIC METABOLIC PANEL
Anion gap: 14 (ref 5–15)
BUN: 32 mg/dL — ABNORMAL HIGH (ref 8–23)
CO2: 28 mmol/L (ref 22–32)
Calcium: 9.7 mg/dL (ref 8.9–10.3)
Chloride: 96 mmol/L — ABNORMAL LOW (ref 98–111)
Creatinine, Ser: 1.22 mg/dL — ABNORMAL HIGH (ref 0.44–1.00)
GFR calc Af Amer: 44 mL/min — ABNORMAL LOW (ref >60)
GFR calc non Af Amer: 38 mL/min — ABNORMAL LOW (ref >60)
Glucose, Bld: 139 mg/dL — ABNORMAL HIGH (ref 70–99)
Potassium: 3.6 mmol/L (ref 3.5–5.1)
Sodium: 138 mmol/L (ref 135–145)

## 2020-04-01 LAB — TROPONIN I (HIGH SENSITIVITY)
Troponin I (High Sensitivity): 12 ng/L (ref <18)
Troponin I (High Sensitivity): 14 ng/L (ref <18)

## 2020-04-01 MED ORDER — MECLIZINE HCL 25 MG PO TABS
25.0000 mg | ORAL_TABLET | Freq: Once | ORAL | Status: AC
  Administered 2020-04-01: 25 mg via ORAL
  Filled 2020-04-01: qty 1

## 2020-04-01 NOTE — ED Notes (Signed)
VS obtained by this RN. Pt visualized in NAD at this time, explained delay. Explained pt will be called back for repeat blood work. Pt states understanding. Daughter remains with patient in the lobby.

## 2020-04-01 NOTE — ED Notes (Signed)
Pt otf for imaging 

## 2020-04-01 NOTE — ED Provider Notes (Signed)
[facility] Emergency Department Provider Note  ____________________________________________  Time seen: Approximately 9:44 PM  I have reviewed the triage vital signs and the nursing notes.   HISTORY  Chief Complaint Dizziness    HPI Colleen Carson is a 86 y.o. female with a history of CHF atrial fibrillation hypertension chronic lymphedema bilateral lower extremities who comes the ED complaining of intermittent dizziness described as room spinning and not lightheadedness ongoing for the past 3 days.  Also associated with feeling fatigued and occasional left-sided chest pain which feels like a twinge and last for a minute right over the site of her pacemaker device in the left upper chest.  Nonradiating, no aggravating or alleviating factors, no shortness of breath diaphoresis or vomiting.  Not exertional.  She notes that she is feeling more anxious as well over the last few days and "claustrophobic" due to upcoming dentist appointment tomorrow morning.  She called her doctor who sent a prescription for clonazepam for her to take as needed for the procedure.  She took Benadryl for the vertigo which seemed to help.  She has done Epley maneuver in the past for this, but has not attempted it so far in the last few days.  She has been compliant with all of her medications.      Past Medical History:  Diagnosis Date  . Arthritis   . Atrial fibrillation (HCC)   . CHF (congestive heart failure) (HCC)   . Dysrhythmia    Afib  . Hypercholesteremia   . Hypertension   . Lymphedema    lower extremities  . S/P total hip arthroplasty 01/25/2015  . Shingles   . Stroke [facility])    noted ischemia on CT scan     Patient Active Problem List   Diagnosis Date Noted  . Acute on chronic diastolic heart failure (HCC) 11/29/2019  . Chronic diastolic heart failure (HCC) 05/17/2018  . Chronic systolic heart failure (HCC) 03/04/2017  . Anxiety 03/04/2017  . TIA (transient  ischemic attack) 12/24/2016  . Acute encephalopathy 10/01/2016  . Symptomatic bradycardia 05/15/2016  . HTN (hypertension) 05/15/2016  . HLD (hyperlipidemia) 05/15/2016  . Atrial fibrillation (HCC) 03/11/2016  . AKI (acute kidney injury) (HCC) 05/06/2015  . S/P total hip arthroplasty 01/25/2015  . Primary osteoarthritis of left hip 01/23/2015     Past Surgical History:  Procedure Laterality Date  . JOINT REPLACEMENT  01/23/15   Left THR Dr. Rosita Kea   . PACEMAKER INSERTION  2017  . TOTAL HIP ARTHROPLASTY Left 01/23/2015   Procedure: TOTAL HIP ARTHROPLASTY ANTERIOR APPROACH;  Surgeon: Kennedy Bucker, MD;  Location: [facility] ORS;  Service: Orthopedics;  Laterality: Left;     Prior to Admission medications   Medication Sig Start Date End Date Taking? Authorizing Provider  apixaban (ELIQUIS) 5 MG TABS tablet Take 5 mg by mouth 2 (two) times daily.    [provider]  atorvastatin (LIPITOR) 40 MG tablet Take 1 tablet (40 mg total) by mouth at bedtime. 01/16/18   Adrian Saran, MD  cetirizine (ZYRTEC) 10 MG tablet Take 10 mg by mouth daily.    [provider]  metoprolol tartrate (LOPRESSOR) 50 MG tablet Take 50 mg by mouth 2 (two) times daily.    [provider]  Multiple Vitamins-Minerals (CENTRUM SILVER 50+WOMEN PO) Take 1 tablet by mouth daily.    [provider]  OVER THE COUNTER MEDICATION Take 200 mg by mouth daily. Take 200mg  of over the counter potassium daily  [provider]  torsemide (DEMADEX) 20 MG tablet Take 2 tablets (40 mg total) by mouth daily. Patient taking differently: Take 20 mg by mouth daily.  12/07/19 01/06/20  Delma Freeze, FNP     Allergies Morphine and Morphine and related   Family History  Problem Relation Age of Onset  . Hypertension Other     Social History Social History   Tobacco Use  . Smoking status: Never Smoker  . Smokeless tobacco: Never Used  Vaping Use  . Vaping Use: Never used  Substance Use Topics   . Alcohol use: No  . Drug use: No    Review of Systems  Constitutional:   No fever or chills.  ENT:   No sore throat. No rhinorrhea. Cardiovascular:   Positive atypical chest pain as above without palpitations or syncope. Respiratory:   No dyspnea or cough. Gastrointestinal:   Negative for abdominal pain, vomiting and diarrhea.  Musculoskeletal:   Negative for focal pain or swelling All other systems reviewed and are negative except as documented above in ROS and HPI.  ____________________________________________   PHYSICAL EXAM:  VITAL SIGNS: ED Triage Vitals  Enc Vitals Group     BP 04/01/20 1650 (!) 175/91     Pulse Rate 04/01/20 1650 62     Resp 04/01/20 1650 16     Temp 04/01/20 1650 (!) 97.5 F (36.4 C)     Temp Source 04/01/20 1650 Oral     SpO2 04/01/20 1650 100 %     Weight 04/01/20 1651 130 lb (59 kg)     Height 04/01/20 1651 5\' 1"  (1.549 m)     Head Circumference --      Peak Flow --      Pain Score 04/01/20 1650 0     Pain Loc --      Pain Edu? --      Excl. in GC? --     Vital signs reviewed, nursing assessments reviewed.   Constitutional:   Alert and oriented. Non-toxic appearance. Eyes:   Conjunctivae are normal. EOMI. PERRL.  No nystagmus ENT      Head:   Normocephalic and atraumatic.      Nose:   Normal.      Mouth/Throat:   Moist mucosa, no oral lesions      Neck:   No meningismus. Full ROM. Hematological/Lymphatic/Immunilogical:   No cervical lymphadenopathy. Cardiovascular:   RRR. Symmetric bilateral radial and DP pulses.  No murmurs. Cap refill less than 2 seconds. Respiratory:   Normal respiratory effort without tachypnea/retractions. Breath sounds are clear and equal bilaterally. No wheezes/rales/rhonchi. Gastrointestinal:   Soft and nontender. Non distended. There is no CVA tenderness.  No rebound, rigidity, or guarding. Musculoskeletal:   Normal range of motion in all extremities. No joint effusions.  No lower extremity tenderness.   Chronic lymphedema, no calf tenderness or swelling, no pitting. Neurologic:   Normal speech and language.  Motor grossly intact. No acute focal neurologic deficits are appreciated.  Skin:    Skin is warm, dry and intact. No rash noted.  No petechiae, purpura, or bullae.  ____________________________________________    LABS (pertinent positives/negatives) (all labs ordered are listed, but only abnormal results are displayed) Labs Reviewed  BASIC METABOLIC PANEL - Abnormal; Notable for the following components:      Result Value   Chloride 96 (*)    Glucose, Bld 139 (*)    BUN 32 (*)    Creatinine, Ser 1.22 (*)  GFR calc non Af Amer 38 (*)    GFR calc Af Amer 44 (*)    All other components within normal limits  CBC - Abnormal; Notable for the following components:   RDW 16.6 (*)    Platelets 136 (*)    All other components within normal limits  URINALYSIS, COMPLETE (UACMP) WITH MICROSCOPIC  CBG MONITORING, ED  TROPONIN I (HIGH SENSITIVITY)  TROPONIN I (HIGH SENSITIVITY)   ____________________________________________   EKG  Interpreted by me Atrial fibrillation with ventricular pacing, rate of 65.  Left axis, left bundle branch block, no acute ischemic changes.  ____________________________________________    RADIOLOGY  DG Chest 2 View  Result Date: 04/01/2020 CLINICAL DATA:  Weakness and dizziness EXAM: CHEST - 2 VIEW COMPARISON:  11/28/2019 FINDINGS: The heart size and mediastinal contours are within normal limits. Both lungs are clear. The visualized skeletal structures are unremarkable. Unchanged position of left chest wall pacemaker IMPRESSION: No active cardiopulmonary disease. Electronically Signed   By: Deatra Robinson M.D.   On: 04/01/2020 20:33    ____________________________________________   PROCEDURES Procedures  ____________________________________________  DIFFERENTIAL DIAGNOSIS   Ventricular dysrhythmia, electrolyte abnormality, dehydration,  pacemaker dysfunction, non-STEMI, vertigo, anxiety  CLINICAL IMPRESSION / ASSESSMENT AND PLAN / ED COURSE  Medications ordered in the ED: Medications  meclizine (ANTIVERT) tablet 25 mg (25 mg Oral Given 04/01/20 2028)    Pertinent labs & imaging results that were available during my care of the patient were reviewed by me and considered in my medical decision making (see chart for details).  Colleen Carson was evaluated in Emergency Department on 04/01/2020 for the symptoms described in the history of present illness. She was evaluated in the context of the global COVID-19 pandemic, which necessitated consideration that the patient might be at risk for infection with the SARS-CoV-2 virus that causes COVID-19. Institutional protocols and algorithms that pertain to the evaluation of patients at risk for COVID-19 are in a state of rapid change based on information released by regulatory bodies including the CDC and federal and state organizations. These policies and algorithms were followed during the patient's care in the ED.   Patient presents with atypical chest pain and exacerbation of her chronic vertigo symptoms.  Symptoms are waxing and waning.  EKG and labs are unremarkable.  Chest x-ray unremarkable.  Pacemaker interrogated which shows permanent atrial fibrillation but no acute events, no shocks.   Considering the patient's symptoms, medical history, and physical examination today, I have low suspicion for ACS, PE, TAD, pneumothorax, carditis, mediastinitis, pneumonia, CHF, or sepsis.  After work-up, I think her symptoms are due to her positional vertigo as well as anxiety about a dental procedure that is coming up tomorrow.  She had reached out to her doctor who prescribed her low-dose clonazepam to take for the anxiety, this will also help with her vertigo.  She will plan to follow-up with her ENT doctor as well as attempt her Epley maneuver which she has done in the past at home.       ____________________________________________   FINAL CLINICAL IMPRESSION(S) / ED DIAGNOSES    Final diagnoses:  Peripheral vertigo involving right ear  Nonspecific chest pain  Permanent atrial fibrillation [facility])     ED Discharge Orders    None      Portions of this note were generated with dragon dictation software. Dictation errors may occur despite best attempts at proofreading.   Sharman Cheek, MD 04/01/20 2151

## 2020-04-01 NOTE — Discharge Instructions (Signed)
Your labs, chest x-ray, and pacemaker report are all unremarkable.  Please continue taking all of your home medications as prescribed.  Additionally, you can use low-dose meclizine for a few days if needed, and the clonazepam prescribed by your doctor.

## 2020-04-01 NOTE — ED Notes (Signed)
Pacemaker interrogation paperwork given to Dr. Scotty Court

## 2020-04-01 NOTE — ED Triage Notes (Signed)
Pt here with daughter who states pt had felt dizzy for a  Daughter answering all questions even though appears A&O.

## 2020-04-01 NOTE — ED Triage Notes (Signed)
First RN Note: Pt presents to ED via POV with c/o severe weakness and dizziness.

## 2020-04-09 ENCOUNTER — Emergency Department
Admission: EM | Admit: 2020-04-09 | Discharge: 2020-04-09 | Disposition: A | Discharge status: Home / Self Care | Payer: Medicare Other | Attending: Emergency Medicine | Admitting: Emergency Medicine

## 2020-04-09 ENCOUNTER — Other Ambulatory Visit: Payer: Self-pay

## 2020-04-09 DIAGNOSIS — R319 Hematuria, unspecified: Secondary | ICD-10-CM | POA: Insufficient documentation

## 2020-04-09 DIAGNOSIS — R350 Frequency of micturition: Secondary | ICD-10-CM | POA: Diagnosis not present

## 2020-04-09 DIAGNOSIS — Z5321 Procedure and treatment not carried out due to patient leaving prior to being seen by health care provider: Secondary | ICD-10-CM | POA: Insufficient documentation

## 2020-04-09 LAB — CBC WITH DIFFERENTIAL/PLATELET
Abs Immature Granulocytes: 0.02 10*3/uL (ref 0.00–0.07)
Basophils Absolute: 0.1 10*3/uL (ref 0.0–0.1)
Basophils Relative: 1 %
Eosinophils Absolute: 0.1 10*3/uL (ref 0.0–0.5)
Eosinophils Relative: 1 %
HCT: 42 % (ref 36.0–46.0)
Hemoglobin: 13.5 g/dL (ref 12.0–15.0)
Immature Granulocytes: 0 %
Lymphocytes Relative: 34 %
Lymphs Abs: 3.1 10*3/uL (ref 0.7–4.0)
MCH: 29 pg (ref 26.0–34.0)
MCHC: 32.1 g/dL (ref 30.0–36.0)
MCV: 90.3 fL (ref 80.0–100.0)
Monocytes Absolute: 0.9 10*3/uL (ref 0.1–1.0)
Monocytes Relative: 10 %
Neutro Abs: 5 10*3/uL (ref 1.7–7.7)
Neutrophils Relative %: 54 %
Platelets: 141 10*3/uL — ABNORMAL LOW (ref 150–400)
RBC: 4.65 MIL/uL (ref 3.87–5.11)
RDW: 16.3 % — ABNORMAL HIGH (ref 11.5–15.5)
WBC: 9.2 10*3/uL (ref 4.0–10.5)
nRBC: 0 % (ref 0.0–0.2)

## 2020-04-09 LAB — COMPREHENSIVE METABOLIC PANEL
ALT: 32 U/L (ref 0–44)
AST: 51 U/L — ABNORMAL HIGH (ref 15–41)
Albumin: 4.5 g/dL (ref 3.5–5.0)
Alkaline Phosphatase: 339 U/L — ABNORMAL HIGH (ref 38–126)
Anion gap: 12 (ref 5–15)
BUN: 36 mg/dL — ABNORMAL HIGH (ref 8–23)
CO2: 28 mmol/L (ref 22–32)
Calcium: 9.4 mg/dL (ref 8.9–10.3)
Chloride: 95 mmol/L — ABNORMAL LOW (ref 98–111)
Creatinine, Ser: 1.2 mg/dL — ABNORMAL HIGH (ref 0.44–1.00)
GFR calc Af Amer: 45 mL/min — ABNORMAL LOW (ref >60)
GFR calc non Af Amer: 39 mL/min — ABNORMAL LOW (ref >60)
Glucose, Bld: 109 mg/dL — ABNORMAL HIGH (ref 70–99)
Potassium: 3.7 mmol/L (ref 3.5–5.1)
Sodium: 135 mmol/L (ref 135–145)
Total Bilirubin: 1.1 mg/dL (ref 0.3–1.2)
Total Protein: 7.5 g/dL (ref 6.5–8.1)

## 2020-04-09 LAB — URINALYSIS, COMPLETE (UACMP) WITH MICROSCOPIC
Bacteria, UA: NONE SEEN
RBC / HPF: >50 RBC/hpf — ABNORMAL HIGH (ref 0–5)
Specific Gravity, Urine: 1.009 (ref 1.005–1.030)
Squamous Epithelial / HPF: NONE SEEN (ref 0–5)
WBC, UA: >50 WBC/hpf — ABNORMAL HIGH (ref 0–5)

## 2020-04-09 NOTE — ED Notes (Signed)
Patient update on wait time. Patient verbalizes understanding.

## 2020-04-09 NOTE — ED Triage Notes (Signed)
Patient reports she had urinary frequency and that when she urinates she is bleeding a lot (patient takes eliquis).

## 2020-04-16 ENCOUNTER — Ambulatory Visit: Payer: Medicare Other | Admitting: Family

## 2020-04-17 NOTE — Progress Notes (Signed)
Patient ID: Colleen Carson, female    DOB: [birth year], 86 y.o.   MRN: [id number]  HPI   Colleen Carson is a 86 y/o female with a history of arthritis, atrial fibrillation, hyperlipidemia, HTN, lymphedema, shingles, CVA and chronic heart failure.  Echo report from 11/29/19 reviewed and showed an EF of 40-45% along with mild/moderate MR, moderate/ severe TR and mildly elevated PA pressure. Reviewed echo report done 01/16/18 which showed an EF of 55-65% along with moderate MR and moderate/severe TR. Reviewed echo report done 02/28/17 which showed an EF of 40-45% along with moderate AS, mild MR and mild/mod TR. PA pressure at 31 mm Hg.   Was in the ED 04/01/20 due to dizziness where she was evaluated and released. Admitted 11/28/19 due to acute on chronic HF. Initially given IV lasix and then transitioned to oral diuretics. Discharged after 2 days. Was in the ED 06/21/2019 due to acute on chronic HF and fatigue where she was treated and released.   She presents today for her follow-up visit with a chief complaint of chronic swelling in her lower legs. She says that this has been present for several years but does improve upon elevation. She has no other symptoms and specifically denies any difficulty sleeping, dizziness, abdominal distention, palpitations, chest pain, shortness of breath, cough, fatigue or weight gain.   Remains quite active at home doing laundry, dusting etc. Does walk outside when it's not too hot / humid outside.   Past Medical History:  Diagnosis Date   Arthritis    Atrial fibrillation (HCC)    CHF (congestive heart failure) (HCC)    Dysrhythmia    Afib   Hypercholesteremia    Hypertension    Lymphedema    lower extremities   S/P total hip arthroplasty 01/25/2015   Shingles    Stroke [facility])    noted ischemia on CT scan   Past Surgical History:  Procedure Laterality Date   JOINT REPLACEMENT  01/23/15   Left THR Dr. Rosita Kea    PACEMAKER INSERTION  2017   TOTAL HIP  ARTHROPLASTY Left 01/23/2015   Procedure: TOTAL HIP ARTHROPLASTY ANTERIOR APPROACH;  Surgeon: Kennedy Bucker, MD;  Location: [facility] ORS;  Service: Orthopedics;  Laterality: Left;   Family History  Problem Relation Age of Onset   Hypertension Other    Social History   Tobacco Use   Smoking status: Never Smoker   Smokeless tobacco: Never Used  Substance Use Topics   Alcohol use: No   Allergies  Allergen Reactions   Morphine Nausea Only    Hallucinations, nausea, vomitting   Morphine And Related Nausea And Vomiting   Prior to Admission medications   Medication Sig Start Date End Date Taking? Authorizing Provider  apixaban (ELIQUIS) 5 MG TABS tablet Take 5 mg by mouth 2 (two) times daily.   Yes [provider]  atorvastatin (LIPITOR) 40 MG tablet Take 1 tablet (40 mg total) by mouth at bedtime. 01/16/18  Yes Mody, Patricia Pesa, MD  cetirizine (ZYRTEC) 10 MG tablet Take 10 mg by mouth daily.   Yes [provider]  metoprolol tartrate (LOPRESSOR) 50 MG tablet Take 50 mg by mouth 2 (two) times daily.   Yes [provider]  Multiple Vitamins-Minerals (CENTRUM SILVER 50+WOMEN PO) Take 1 tablet by mouth daily.   Yes [provider]  OVER THE COUNTER MEDICATION Take 200 mg by mouth daily. Take 200mg  of over the counter potassium daily   Yes [provider]  torsemide (DEMADEX)  20 MG tablet Take 2 tablets (40 mg total) by mouth daily. Patient taking differently: Take 20 mg by mouth daily.  12/07/19 04/18/20 Yes Delma Freeze, FNP    Review of Systems  Constitutional: Negative for appetite change and fatigue.  HENT: Negative for congestion, postnasal drip and sore throat.   Eyes: Negative.   Respiratory: Negative for cough, chest tightness and shortness of breath.   Cardiovascular: Positive for leg swelling. Negative for chest pain and palpitations.  Gastrointestinal: Negative for abdominal distention and abdominal pain.  Endocrine: Negative.    Genitourinary: Negative.   Musculoskeletal: Negative for back pain and neck pain.  Skin: Negative.   Allergic/Immunologic: Negative.   Neurological: Negative for dizziness, weakness and light-headedness.  Hematological: Negative for adenopathy. Does not bruise/bleed easily.  Psychiatric/Behavioral: Negative for dysphoric mood and sleep disturbance (sleeping on 1 pillow). The patient is not nervous/anxious.    Vitals:   04/18/20 1102  BP: (!) 106/63  Pulse: 90  Resp: 16  SpO2: 94%  Weight: 138 lb (62.6 kg)  Height: 5\' 1"  (1.549 m)   Wt Readings from Last 3 Encounters:  04/18/20 138 lb (62.6 kg)  04/09/20 129 lb 13.6 oz (58.9 kg)  04/01/20 130 lb (59 kg)   Lab Results  Component Value Date   CREATININE 1.20 (H) 04/09/2020   CREATININE 1.22 (H) 04/01/2020   CREATININE 1.35 (H) 01/24/2020    Physical Exam Vitals and nursing note reviewed.  Constitutional:      Appearance: She is well-developed.  HENT:     Head: Normocephalic and atraumatic.  Neck:     Vascular: No JVD.  Cardiovascular:     Rate and Rhythm: Normal rate and regular rhythm.  Pulmonary:     Effort: Pulmonary effort is normal.     Breath sounds: No wheezing or rales.  Abdominal:     General: There is no distension.     Palpations: Abdomen is soft.     Tenderness: There is no abdominal tenderness.  Musculoskeletal:        General: No tenderness.     Cervical back: Normal range of motion and neck supple.     Right lower leg: Edema (nonpitting) present.     Left lower leg: Edema (nonpitting) present.  Skin:    General: Skin is warm and dry.  Neurological:     Mental Status: She is alert and oriented to person, place, and time.  Psychiatric:        Behavior: Behavior normal.        Thought Content: Thought content normal.     Assessment & Plan:  1: Chronic heart failure with preserved ejection fraction- - NYHA class I - euvolemic today - weighing daily; reminded to call for an overnight weight  gain of >2 pounds or a weekly weight gain of >5 pounds - weight down 3 pounds from last visit 4 months ago - not adding salt to her food and her daughter is reading food labels and cooking foods from scratch. Reviewed the importance of closely following a 2000mg  sodium diet - encouraged her to drink 40-50 ounces of fluid daily - saw cardiologist 03/25/2020) 03/07/20 - remains very active at home - BNP 11/28/19 was 461.0 - reports receiving both COVID vaccines  2: HTN- - BP looks good although on the low side - saw PCP 03/09/20) 04/03/20 - BMP from 04/09/20 reviewed and showed sodium 135, potassium 3.7, creatinine 1.20 and GFR 39  3: Lymphedema- - stage 2 - does  exercise by doing quite a bit of walking when it's not too hot/ humid - wearing a mild compression stocking right now which she says does help and that she can tolerate - does elevate her legs when she's sitting for long periods of time with improvement in her edema - unable to wear compression boots as she gets nauseous and hypotensive every time she uses them    Patient did not bring her medications nor a list. Each medication was verbally reviewed with the patient and she was encouraged to bring the bottles to every visit to confirm accuracy of list.  Return in 6 months or sooner for any questions/problems before then.

## 2020-04-18 ENCOUNTER — Encounter: Payer: Self-pay | Admitting: Family

## 2020-04-18 ENCOUNTER — Ambulatory Visit: Payer: Medicare Other | Admitting: Family

## 2020-04-18 ENCOUNTER — Ambulatory Visit: Payer: Medicare Other | Attending: Family | Admitting: Family

## 2020-04-18 ENCOUNTER — Other Ambulatory Visit: Payer: Self-pay

## 2020-04-18 VITALS — BP 106/63 | HR 90 | Resp 16 | Ht 61.0 in | Wt 138.0 lb

## 2020-04-18 DIAGNOSIS — E7800 Pure hypercholesterolemia, unspecified: Secondary | ICD-10-CM | POA: Diagnosis not present

## 2020-04-18 DIAGNOSIS — Z950 Presence of cardiac pacemaker: Secondary | ICD-10-CM | POA: Diagnosis not present

## 2020-04-18 DIAGNOSIS — Z8249 Family history of ischemic heart disease and other diseases of the circulatory system: Secondary | ICD-10-CM | POA: Diagnosis not present

## 2020-04-18 DIAGNOSIS — I110 Hypertensive heart disease with heart failure: Secondary | ICD-10-CM | POA: Diagnosis not present

## 2020-04-18 DIAGNOSIS — Z79899 Other long term (current) drug therapy: Secondary | ICD-10-CM | POA: Diagnosis not present

## 2020-04-18 DIAGNOSIS — I10 Essential (primary) hypertension: Secondary | ICD-10-CM

## 2020-04-18 DIAGNOSIS — Z8673 Personal history of transient ischemic attack (TIA), and cerebral infarction without residual deficits: Secondary | ICD-10-CM | POA: Diagnosis not present

## 2020-04-18 DIAGNOSIS — E785 Hyperlipidemia, unspecified: Secondary | ICD-10-CM | POA: Diagnosis not present

## 2020-04-18 DIAGNOSIS — I5032 Chronic diastolic (congestive) heart failure: Secondary | ICD-10-CM | POA: Diagnosis present

## 2020-04-18 DIAGNOSIS — Z96642 Presence of left artificial hip joint: Secondary | ICD-10-CM | POA: Diagnosis not present

## 2020-04-18 DIAGNOSIS — Z7901 Long term (current) use of anticoagulants: Secondary | ICD-10-CM | POA: Insufficient documentation

## 2020-04-18 DIAGNOSIS — Z885 Allergy status to narcotic agent status: Secondary | ICD-10-CM | POA: Diagnosis not present

## 2020-04-18 DIAGNOSIS — I4891 Unspecified atrial fibrillation: Secondary | ICD-10-CM | POA: Insufficient documentation

## 2020-04-18 DIAGNOSIS — I890 Lymphedema, not elsewhere classified: Secondary | ICD-10-CM

## 2020-04-18 DIAGNOSIS — Z8619 Personal history of other infectious and parasitic diseases: Secondary | ICD-10-CM | POA: Insufficient documentation

## 2020-04-18 DIAGNOSIS — M1990 Unspecified osteoarthritis, unspecified site: Secondary | ICD-10-CM | POA: Diagnosis not present

## 2020-04-18 NOTE — Patient Instructions (Signed)
Continue weighing daily and call for an overnight weight gain of > 2 pounds or a weekly weight gain of >5 pounds. 

## 2020-10-15 ENCOUNTER — Encounter: Payer: Self-pay | Admitting: Family

## 2020-10-15 ENCOUNTER — Other Ambulatory Visit: Payer: Self-pay

## 2020-10-15 ENCOUNTER — Ambulatory Visit: Payer: Medicare Other | Attending: Family | Admitting: Family

## 2020-10-15 VITALS — BP 123/94 | HR 80 | Resp 18 | Ht 61.0 in | Wt 142.5 lb

## 2020-10-15 DIAGNOSIS — I5033 Acute on chronic diastolic (congestive) heart failure: Secondary | ICD-10-CM | POA: Diagnosis present

## 2020-10-15 DIAGNOSIS — Z885 Allergy status to narcotic agent status: Secondary | ICD-10-CM | POA: Insufficient documentation

## 2020-10-15 DIAGNOSIS — I890 Lymphedema, not elsewhere classified: Secondary | ICD-10-CM | POA: Diagnosis not present

## 2020-10-15 DIAGNOSIS — Z8673 Personal history of transient ischemic attack (TIA), and cerebral infarction without residual deficits: Secondary | ICD-10-CM | POA: Insufficient documentation

## 2020-10-15 DIAGNOSIS — E785 Hyperlipidemia, unspecified: Secondary | ICD-10-CM | POA: Insufficient documentation

## 2020-10-15 DIAGNOSIS — Z950 Presence of cardiac pacemaker: Secondary | ICD-10-CM | POA: Insufficient documentation

## 2020-10-15 DIAGNOSIS — Z7901 Long term (current) use of anticoagulants: Secondary | ICD-10-CM | POA: Insufficient documentation

## 2020-10-15 DIAGNOSIS — I10 Essential (primary) hypertension: Secondary | ICD-10-CM

## 2020-10-15 DIAGNOSIS — Z8249 Family history of ischemic heart disease and other diseases of the circulatory system: Secondary | ICD-10-CM | POA: Diagnosis not present

## 2020-10-15 DIAGNOSIS — I110 Hypertensive heart disease with heart failure: Secondary | ICD-10-CM | POA: Diagnosis not present

## 2020-10-15 DIAGNOSIS — Z79899 Other long term (current) drug therapy: Secondary | ICD-10-CM | POA: Insufficient documentation

## 2020-10-15 MED ORDER — METOLAZONE 2.5 MG PO TABS
2.5000 mg | ORAL_TABLET | Freq: Every day | ORAL | 0 refills | Status: DC
Start: 2020-10-15 — End: 2020-11-07

## 2020-10-15 NOTE — Patient Instructions (Addendum)
Continue weighing daily and call for an overnight weight gain of > 2 pounds or a weekly weight gain of >5 pounds.  Take metolazone (booster fluid pill) 1/2 hour before morning torsemide on Tuesday and Wednesday. On these 2 days, take an additional potassium pill as well (2 potassium pills total on Tuesday and Wednesday)

## 2020-10-15 NOTE — Progress Notes (Signed)
Patient ID: Colleen Carson, female    DOB: Sep 08, 1925, 87 y.o.   MRN: [id number]  HPI   Colleen Carson is a 87 y/o female with a history of arthritis, atrial fibrillation, hyperlipidemia, HTN, lymphedema, shingles, CVA and chronic heart failure.  Echo report from 11/29/19 reviewed and showed an EF of 40-45% along with mild/moderate MR, moderate/ severe TR and mildly elevated PA pressure. Reviewed echo report done 01/16/18 which showed an EF of 55-65% along with moderate MR and moderate/severe TR. Reviewed echo report done 02/28/17 which showed an EF of 40-45% along with moderate AS, mild MR and mild/mod TR. PA pressure at 31 mm Hg.   Has not been admitted or been in the ED in the last 6 months.   She presents today for an acute visit with a chief complaint of moderate shortness of breath with little exertion. She describes this as chronic in nature having been present for several years although seems to have worsened over the last week. She has associated fatigue, weight gain (5 pounds in the last week) and worsening pedal edema along with this. She also feels "off balance". She denies any difficulty sleeping, dizziness, abdominal distention, palpitations, chest pain or cough.   Denies any change in her diet. Normally takes 20mg  torsemide daily but for the last couple of day has taken 40mg  daily without any relief.   Past Medical History:  Diagnosis Date  . Arthritis   . Atrial fibrillation (HCC)   . CHF (congestive heart failure) (HCC)   . Dysrhythmia    Afib  . Hypercholesteremia   . Hypertension   . Lymphedema    lower extremities  . S/P total hip arthroplasty 01/25/2015  . Shingles   . Stroke [facility])    noted ischemia on CT scan   Past Surgical History:  Procedure Laterality Date  . JOINT REPLACEMENT  01/23/15   Left THR [facility]   . PACEMAKER INSERTION  2017  . TOTAL HIP ARTHROPLASTY Left 01/23/2015   Procedure: TOTAL HIP ARTHROPLASTY ANTERIOR APPROACH;  Surgeon: 2018, MD;  Location:  [facility] ORS;  Service: Orthopedics;  Laterality: Left;   Family History  Problem Relation Age of Onset  . Hypertension Other    Social History   Tobacco Use  . Smoking status: Never Smoker  . Smokeless tobacco: Never Used  Substance Use Topics  . Alcohol use: No   Allergies  Allergen Reactions  . Morphine Nausea Only    Hallucinations, nausea, vomitting  . Morphine And Related Nausea And Vomiting   Prior to Admission medications   Medication Sig Start Date End Date Taking? Authorizing Provider  apixaban (ELIQUIS) 5 MG TABS tablet Take 5 mg by mouth 2 (two) times daily.   Yes [provider]  atorvastatin (LIPITOR) 40 MG tablet Take 1 tablet (40 mg total) by mouth at bedtime. 01/16/18  Yes Mody, Kennedy Bucker, MD  cetirizine (ZYRTEC) 10 MG tablet Take 10 mg by mouth daily.   Yes [provider]  Cranberry 1000 MG CAPS Take by mouth daily.   Yes [provider]  metoprolol tartrate (LOPRESSOR) 50 MG tablet Take 50 mg by mouth 2 (two) times daily.   Yes [provider]  Multiple Vitamins-Minerals (CENTRUM SILVER 50+WOMEN PO) Take 1 tablet by mouth daily.   Yes [provider]  OVER THE COUNTER MEDICATION Take 200 mg by mouth daily. Take 200mg  of over the counter potassium daily   Yes [provider]  torsemide (DEMADEX) 20  MG tablet Take 2 tablets (40 mg total) by mouth daily. 12/07/19 04/18/20 Yes Delma Freeze, FNP    Review of Systems  Constitutional: Positive for fatigue. Negative for appetite change.  HENT: Negative for congestion, postnasal drip and sore throat.   Eyes: Negative.   Respiratory: Positive for shortness of breath. Negative for cough and chest tightness.   Cardiovascular: Positive for leg swelling. Negative for chest pain and palpitations.  Gastrointestinal: Negative for abdominal distention and abdominal pain.  Endocrine: Negative.   Genitourinary: Negative.   Musculoskeletal: Negative for back pain and neck pain.   Skin: Negative.   Allergic/Immunologic: Negative.   Neurological: Negative for dizziness, weakness and light-headedness.       "wobbly"  Hematological: Negative for adenopathy. Does not bruise/bleed easily.  Psychiatric/Behavioral: Negative for dysphoric mood and sleep disturbance (sleeping on 1 pillow). The patient is not nervous/anxious.    Vitals:   10/15/20 1303  BP: (!) 123/94  Pulse: 80  Resp: 18  SpO2: 100%  Weight: 142 lb 8 oz (64.6 kg)  Height: 5\' 1"  (1.549 m)   Wt Readings from Last 3 Encounters:  10/15/20 142 lb 8 oz (64.6 kg)  04/18/20 138 lb (62.6 kg)  04/09/20 129 lb 13.6 oz (58.9 kg)   Lab Results  Component Value Date   CREATININE 1.20 (H) 04/09/2020   CREATININE 1.22 (H) 04/01/2020   CREATININE 1.35 (H) 01/24/2020    Physical Exam Vitals and nursing note reviewed.  Constitutional:      Appearance: She is well-developed.  HENT:     Head: Normocephalic and atraumatic.  Neck:     Vascular: No JVD.  Cardiovascular:     Rate and Rhythm: Normal rate and regular rhythm.  Pulmonary:     Effort: Pulmonary effort is normal.     Breath sounds: No wheezing or rales.  Abdominal:     General: There is no distension.     Palpations: Abdomen is soft.     Tenderness: There is no abdominal tenderness.  Musculoskeletal:        General: No tenderness.     Cervical back: Normal range of motion and neck supple.     Right lower leg: No tenderness. Edema (2+ pitting) present.     Left lower leg: No tenderness. Edema (2+ pitting) present.  Skin:    General: Skin is warm and dry.  Neurological:     Mental Status: She is alert and oriented to person, place, and time.  Psychiatric:        Behavior: Behavior normal.        Thought Content: Thought content normal.    Assessment & Plan:  1: Acute on Chronic heart failure with preserved ejection fraction- - NYHA class III - mildly fluid overloaded today with worsening edema and weight gain - weighing daily; reminded  to call for an overnight weight gain of >2 pounds or a weekly weight gain of >5 pounds - weight up 4 pounds from last visit 6 months ago; reports home weight up 5 pounds in the last week - will add metolazone 2.5mg  daily for 2 days; advised to start it tomorrow and take it 1/2 hour before morning torsemide - she is to double her potassium on those 2 days as well - will check BMP later this week - not adding salt to her food and her daughter is reading food labels and cooking foods from scratch.  - encouraged her to drink 40-50 ounces of fluid daily - saw  cardiologist (Fath) 03/07/20 - BNP 11/28/19 was 461.0 - reports receiving both COVID vaccines  2: HTN- - BP mildly elevated today - saw PCP Larwance Sachs) 04/24/20 - BMP from 8/9/21reviewed and showed sodium 143, potassium 4.1, creatinine 1.3 and GFR 38  3: Lymphedema- - stage 2 - does exercise by doing quite a bit of walking when it's not too hot/ humid although hasn't been walking as much due to the snow and worsening symptoms that she's having - wearing a mild compression stocking right now which she says does help and that she can tolerate - does elevate her legs when she's sitting for long periods of time with improvement in her edema - unable to wear compression boots as she gets nauseous and hypotensive every time she uses them    Patient did not bring her medications nor a list. Each medication was verbally reviewed with the patient and she was encouraged to bring the bottles to every visit to confirm accuracy of list.  Return in 3 days or sooner for any questions/problems before then.

## 2020-10-16 ENCOUNTER — Ambulatory Visit (INDEPENDENT_AMBULATORY_CARE_PROVIDER_SITE_OTHER): Payer: Medicare Other | Admitting: Dermatology

## 2020-10-16 ENCOUNTER — Other Ambulatory Visit: Payer: Self-pay

## 2020-10-16 DIAGNOSIS — L821 Other seborrheic keratosis: Secondary | ICD-10-CM

## 2020-10-16 DIAGNOSIS — L820 Inflamed seborrheic keratosis: Secondary | ICD-10-CM

## 2020-10-16 DIAGNOSIS — L578 Other skin changes due to chronic exposure to nonionizing radiation: Secondary | ICD-10-CM

## 2020-10-16 NOTE — Progress Notes (Signed)
   Follow-Up Visit   Subjective  Colleen Carson is a 87 y.o. female who presents for the following: Nevus (Patient has some moles at back, one behind right knee that she would like looked at. Also has a dry spot at left temple, some itch, irritated by bra. No history of skin cancer. ).    The following portions of the chart were reviewed this encounter and updated as appropriate:       Review of Systems:  No other skin or systemic complaints except as noted in HPI or Assessment and Plan.  Objective  Well appearing patient in no apparent distress; mood and affect are within normal limits.  A focused examination was performed including back, right leg, scalp, face. Relevant physical exam findings are noted in the Assessment and Plan.  Objective  Left frontal hairline x 1, right post thigh x 1, left mid back at braline x 2, right mid back at braline x1 (5): Erythematous keratotic or waxy stuck-on papule or plaque.    Assessment & Plan  Inflamed seborrheic keratosis (5) Left frontal hairline x 1, right post thigh x 1, left mid back at braline x 2, right mid back at braline x1  ISKs x 2 on L lateral back are large and may need additional treatments to clear, may recur  Destruction of lesion - Left frontal hairline x 1, right post thigh x 1, left mid back at braline x 2, right mid back at braline x1  Destruction method: cryotherapy   Informed consent: discussed and consent obtained   Lesion destroyed using liquid nitrogen: Yes   Region frozen until ice ball extended beyond lesion: Yes   Outcome: patient tolerated procedure well with no complications   Post-procedure details: wound care instructions given    Seborrheic Keratoses - Stuck-on, waxy, tan-brown papules and plaques  - Discussed benign etiology and prognosis. - Observe - Call for any changes  Actinic Damage - chronic, secondary to cumulative UV radiation exposure/sun exposure over time - diffuse scaly erythematous  macules with underlying dyspigmentation - Recommend daily broad spectrum sunscreen SPF 30+ to sun-exposed areas, reapply every 2 hours as needed.  - Call for new or changing lesions.  Return if symptoms worsen or fail to improve.  Anise Salvo, RMA, am acting as scribe for Willeen Niece, MD . Documentation: I have reviewed the above documentation for accuracy and completeness, and I agree with the above.  Willeen Niece MD

## 2020-10-16 NOTE — Patient Instructions (Addendum)
Cryotherapy Aftercare  . Wash gently with soap and water everyday.   Marland Kitchen Apply Vaseline and Band-Aid daily until healed.  Melanoma ABCDEs  Melanoma is the most dangerous type of skin cancer, and is the leading cause of death from skin disease.  You are more likely to develop melanoma if you:  Have light-colored skin, light-colored eyes, or red or blond hair  Spend a lot of time in the sun  Tan regularly, either outdoors or in a tanning bed  Have had blistering sunburns, especially during childhood  Have a close family member who has had a melanoma  Have atypical moles or large birthmarks  Early detection of melanoma is key since treatment is typically straightforward and cure rates are extremely high if we catch it early.   The first sign of melanoma is often a change in a mole or a new dark spot.  The ABCDE system is a way of remembering the signs of melanoma.  A for asymmetry:  The two halves do not match. B for border:  The edges of the growth are irregular. C for color:  A mixture of colors are present instead of an even brown color. D for diameter:  Melanomas are usually (but not always) greater than 42mm - the size of a pencil eraser. E for evolution:  The spot keeps changing in size, shape, and color.  Please check your skin once per month between visits. You can use a small mirror in front and a large mirror behind you to keep an eye on the back side or your body.   If you see any new or changing lesions before your next follow-up, please call to schedule a visit.  Please continue daily skin protection including broad spectrum sunscreen SPF 30+ to sun-exposed areas, reapplying every 2 hours as needed when you're outdoors.    Seborrheic Keratosis  What causes seborrheic keratoses? Seborrheic keratoses are harmless, common skin growths that first appear during adult life.  As time goes by, more growths appear.  Some people may develop a large number of them.  Seborrheic  keratoses appear on both covered and uncovered body parts.  They are not caused by sunlight.  The tendency to develop seborrheic keratoses can be inherited.  They vary in color from skin-colored to gray, brown, or even black.  They can be either smooth or have a rough, warty surface.   Seborrheic keratoses are superficial and look as if they were stuck on the skin.  Under the microscope this type of keratosis looks like layers upon layers of skin.  That is why at times the top layer may seem to fall off, but the rest of the growth remains and re-grows.    Treatment Seborrheic keratoses do not need to be treated, but can easily be removed in the office.  Seborrheic keratoses often cause symptoms when they rub on clothing or jewelry.  Lesions can be in the way of shaving.  If they become inflamed, they can cause itching, soreness, or burning.  Removal of a seborrheic keratosis can be accomplished by freezing, burning, or surgery. If any spot bleeds, scabs, or grows rapidly, please return to have it checked, as these can be an indication of a skin cancer.   compression sock donner will help putting compression socks on daily

## 2020-10-17 ENCOUNTER — Ambulatory Visit: Payer: Medicare Other | Admitting: Family

## 2020-10-18 ENCOUNTER — Ambulatory Visit: Payer: Medicare Other | Admitting: Family

## 2020-10-18 ENCOUNTER — Other Ambulatory Visit
Admission: RE | Admit: 2020-10-18 | Discharge: 2020-10-18 | Disposition: A | Discharge status: Home / Self Care | Payer: Medicare Other | Source: Ambulatory Visit | Attending: Family | Admitting: Family

## 2020-10-18 ENCOUNTER — Encounter: Payer: Self-pay | Admitting: Family

## 2020-10-18 ENCOUNTER — Telehealth: Payer: Self-pay | Admitting: Family

## 2020-10-18 ENCOUNTER — Other Ambulatory Visit: Payer: Self-pay

## 2020-10-18 VITALS — BP 124/77 | HR 92 | Resp 18 | Ht 61.0 in | Wt 136.0 lb

## 2020-10-18 DIAGNOSIS — I890 Lymphedema, not elsewhere classified: Secondary | ICD-10-CM

## 2020-10-18 DIAGNOSIS — E785 Hyperlipidemia, unspecified: Secondary | ICD-10-CM | POA: Insufficient documentation

## 2020-10-18 DIAGNOSIS — I5032 Chronic diastolic (congestive) heart failure: Secondary | ICD-10-CM | POA: Insufficient documentation

## 2020-10-18 DIAGNOSIS — Z8673 Personal history of transient ischemic attack (TIA), and cerebral infarction without residual deficits: Secondary | ICD-10-CM | POA: Insufficient documentation

## 2020-10-18 DIAGNOSIS — Z7901 Long term (current) use of anticoagulants: Secondary | ICD-10-CM | POA: Insufficient documentation

## 2020-10-18 DIAGNOSIS — I509 Heart failure, unspecified: Secondary | ICD-10-CM | POA: Insufficient documentation

## 2020-10-18 DIAGNOSIS — R0602 Shortness of breath: Secondary | ICD-10-CM | POA: Insufficient documentation

## 2020-10-18 DIAGNOSIS — R5383 Other fatigue: Secondary | ICD-10-CM | POA: Insufficient documentation

## 2020-10-18 DIAGNOSIS — I110 Hypertensive heart disease with heart failure: Secondary | ICD-10-CM | POA: Insufficient documentation

## 2020-10-18 DIAGNOSIS — R609 Edema, unspecified: Secondary | ICD-10-CM | POA: Insufficient documentation

## 2020-10-18 DIAGNOSIS — Z950 Presence of cardiac pacemaker: Secondary | ICD-10-CM | POA: Insufficient documentation

## 2020-10-18 DIAGNOSIS — M1990 Unspecified osteoarthritis, unspecified site: Secondary | ICD-10-CM | POA: Insufficient documentation

## 2020-10-18 DIAGNOSIS — I10 Essential (primary) hypertension: Secondary | ICD-10-CM

## 2020-10-18 DIAGNOSIS — E7800 Pure hypercholesterolemia, unspecified: Secondary | ICD-10-CM | POA: Insufficient documentation

## 2020-10-18 DIAGNOSIS — Z79899 Other long term (current) drug therapy: Secondary | ICD-10-CM | POA: Insufficient documentation

## 2020-10-18 LAB — BASIC METABOLIC PANEL
Anion gap: 16 — ABNORMAL HIGH (ref 5–15)
BUN: 47 mg/dL — ABNORMAL HIGH (ref 8–23)
CO2: 34 mmol/L — ABNORMAL HIGH (ref 22–32)
Calcium: 9.9 mg/dL (ref 8.9–10.3)
Chloride: 91 mmol/L — ABNORMAL LOW (ref 98–111)
Creatinine, Ser: 1.61 mg/dL — ABNORMAL HIGH (ref 0.44–1.00)
GFR, Estimated: 30 mL/min — ABNORMAL LOW (ref >60)
Glucose, Bld: 119 mg/dL — ABNORMAL HIGH (ref 70–99)
Potassium: 3.1 mmol/L — ABNORMAL LOW (ref 3.5–5.1)
Sodium: 141 mmol/L (ref 135–145)

## 2020-10-18 NOTE — Telephone Encounter (Signed)
Spoke with patient's daughter, Colleen Carson, regarding patient's lab results obtained earlier today.   Patient had taken 2 doses of 2.5mg  metolazone along with extra potassium earlier this week due to fluid retention.   Potassium level is low at 3.1 along with worsening renal function. Mardene Sayer to have patient take 2 of her potassium supplements every day until we recheck her lab work next week. Explained that the OTC potassium supplements were not as strong as the prescription dose so depending on next week's results, we may need to change what potassium she is taking.   Will re-check a BMP next Wed 10/24/20. Colleen Carson verbalized understanding and was in agreement with this plan.

## 2020-10-18 NOTE — Patient Instructions (Signed)
Continue weighing daily and call for an overnight weight gain of > 2 pounds or a weekly weight gain of >5 pounds. 

## 2020-10-18 NOTE — Progress Notes (Signed)
Patient ID: Colleen Carson, female    DOB: 09/10/1925, 87 y.o.   MRN: [id number]  HPI   Colleen Carson is a 87 y/o female with a history of arthritis, atrial fibrillation, hyperlipidemia, HTN, lymphedema, shingles, CVA and chronic heart failure.  Echo report from 11/29/19 reviewed and showed an EF of 40-45% along with mild/moderate MR, moderate/ severe TR and mildly elevated PA pressure. Reviewed echo report done 01/16/18 which showed an EF of 55-65% along with moderate MR and moderate/severe TR. Reviewed echo report done 02/28/17 which showed an EF of 40-45% along with moderate AS, mild MR and mild/mod TR. PA pressure at 31 mm Hg.   Has not been admitted or been in the ED in the last 6 months.   She presents today for an acute visit with a chief complaint of minimal shortness of breath upon moderate exertion. She describes this as chronic in nature having been present for several years and says that symptoms have improved since taking metolazone. She has associated fatigue and pedal edema (improving) along with this. She denies any dizziness, cougheep, chest pain, palpitations, abdominal distention, difficulty sleeping or weight gain.   Was given 2.5mg  metolazone for 2 doses since she was last here along with extra potassium supplements  Past Medical History:  Diagnosis Date  . Arthritis   . Atrial fibrillation (HCC)   . CHF (congestive heart failure) (HCC)   . Dysrhythmia    Afib  . Hypercholesteremia   . Hypertension   . Lymphedema    lower extremities  . S/P total hip arthroplasty 01/25/2015  . Shingles   . Stroke [facility])    noted ischemia on CT scan   Past Surgical History:  Procedure Laterality Date  . JOINT REPLACEMENT  01/23/15   Left THR Dr. Rosita Kea   . PACEMAKER INSERTION  2017  . TOTAL HIP ARTHROPLASTY Left 01/23/2015   Procedure: TOTAL HIP ARTHROPLASTY ANTERIOR APPROACH;  Surgeon: Kennedy Bucker, MD;  Location: [facility] ORS;  Service: Orthopedics;  Laterality: Left;   Family History  Problem  Relation Age of Onset  . Hypertension Other    Social History   Tobacco Use  . Smoking status: Never Smoker  . Smokeless tobacco: Never Used  Substance Use Topics  . Alcohol use: No   Allergies  Allergen Reactions  . Morphine Nausea Only    Hallucinations, nausea, vomitting  . Morphine And Related Nausea And Vomiting   Prior to Admission medications   Medication Sig Start Date End Date Taking? Authorizing Provider  apixaban (ELIQUIS) 5 MG TABS tablet Take 5 mg by mouth 2 (two) times daily.   Yes [provider]  atorvastatin (LIPITOR) 40 MG tablet Take 1 tablet (40 mg total) by mouth at bedtime. 01/16/18  Yes Mody, Patricia Pesa, MD  cetirizine (ZYRTEC) 10 MG tablet Take 10 mg by mouth daily.   Yes [provider]  Cranberry 1000 MG CAPS Take by mouth daily.   Yes [provider]  metolazone (ZAROXOLYN) 2.5 MG tablet Take 1 tablet (2.5 mg total) by mouth daily. Take 1/2 hour before morning torsemide 10/15/20 01/13/21 Yes Kalil Woessner A, FNP  metoprolol tartrate (LOPRESSOR) 50 MG tablet Take 50 mg by mouth 2 (two) times daily.   Yes [provider]  Multiple Vitamins-Minerals (CENTRUM SILVER 50+WOMEN PO) Take 1 tablet by mouth daily.   Yes [provider]  OVER THE COUNTER MEDICATION Take 200 mg by mouth daily. Take 200mg  of over the counter potassium daily  Yes [provider]  torsemide (DEMADEX) 20 MG tablet Take 2 tablets (40 mg total) by mouth daily. Patient taking differently: Take 20 mg by mouth daily. 12/07/19 04/18/20  Delma Freeze, FNP     Review of Systems  Constitutional: Positive for fatigue. Negative for appetite change.  HENT: Negative for congestion, postnasal drip and sore throat.   Eyes: Negative.   Respiratory: Positive for shortness of breath. Negative for cough and chest tightness.   Cardiovascular: Positive for leg swelling. Negative for chest pain and palpitations.  Gastrointestinal: Negative for abdominal  distention and abdominal pain.  Endocrine: Negative.   Genitourinary: Negative.   Musculoskeletal: Negative for back pain and neck pain.  Skin: Negative.   Allergic/Immunologic: Negative.   Neurological: Negative for dizziness, weakness and light-headedness.       "wobbly"  Hematological: Negative for adenopathy. Does not bruise/bleed easily.  Psychiatric/Behavioral: Negative for dysphoric mood and sleep disturbance (sleeping on 1 pillow). The patient is not nervous/anxious.    Vitals:   10/18/20 1330  BP: 124/77  Pulse: 92  Resp: 18  SpO2: 100%  Weight: 136 lb (61.7 kg)  Height: 5\' 1"  (1.549 m)   Wt Readings from Last 3 Encounters:  10/18/20 136 lb (61.7 kg)  10/15/20 142 lb 8 oz (64.6 kg)  04/18/20 138 lb (62.6 kg)   Lab Results  Component Value Date   CREATININE 1.20 (H) 04/09/2020   CREATININE 1.22 (H) 04/01/2020   CREATININE 1.35 (H) 01/24/2020   Physical Exam Vitals and nursing note reviewed.  Constitutional:      Appearance: She is well-developed.  HENT:     Head: Normocephalic and atraumatic.  Neck:     Vascular: No JVD.  Cardiovascular:     Rate and Rhythm: Normal rate and regular rhythm.  Pulmonary:     Effort: Pulmonary effort is normal.     Breath sounds: No wheezing or rales.  Abdominal:     General: There is no distension.     Palpations: Abdomen is soft.     Tenderness: There is no abdominal tenderness.  Musculoskeletal:        General: No tenderness.     Cervical back: Normal range of motion and neck supple.     Right lower leg: No tenderness. Edema (nonpitting) present.     Left lower leg: No tenderness. Edema (nonpitting) present.  Skin:    General: Skin is warm and dry.  Neurological:     Mental Status: She is alert and oriented to person, place, and time.  Psychiatric:        Behavior: Behavior normal.        Thought Content: Thought content normal.    Assessment & Plan:  1: Chronic heart failure with preserved ejection fraction- -  NYHA class II - euvolemic today - weighing daily; reminded to call for an overnight weight gain of >2 pounds or a weekly weight gain of >5 pounds - weight down 6 pounds from last visit 3 days ago - took 2 days of metolazone 2.5mg  along with extra potassium for 2 days as well - will check BMP today - not adding salt to her food and her daughter is reading food labels and cooking foods from scratch.  - encouraged her to drink 40-50 ounces of fluid daily - saw cardiologist (Fath) 03/07/20 - BNP 11/28/19 was 461.0 - reports receiving both COVID vaccines  2: HTN- - BP looks good today - saw PCP 01/28/20) 04/24/20 - BMP from 8/9/21reviewed  and showed sodium 143, potassium 4.1, creatinine 1.3 and GFR 38  3: Lymphedema- - stage 2 - does exercise by doing quite a bit of walking when it's not too hot/ humid although hasn't been walking as much due to the snow and worsening symptoms that she's having - wearing a mild compression stocking right now which she says does help and that she can tolerate - does elevate her legs when she's sitting for long periods of time with improvement in her edema - unable to wear compression boots as she gets nauseous and hypotensive every time she uses them    Patient did not bring her medications nor a list. Each medication was verbally reviewed with the patient and she was encouraged to bring the bottles to every visit to confirm accuracy of list.  Return in 4 months or sooner for any questions/problems before then.

## 2020-10-23 ENCOUNTER — Telehealth: Payer: Self-pay | Admitting: Family

## 2020-10-23 DIAGNOSIS — E876 Hypokalemia: Secondary | ICD-10-CM

## 2020-10-23 NOTE — Telephone Encounter (Signed)
Patient's potassium level was low (3.1) last week. She has doubled her potassium dose and needs order placed to recheck it tomorrow.  Order has been placed.

## 2020-10-24 ENCOUNTER — Other Ambulatory Visit
Admission: RE | Admit: 2020-10-24 | Discharge: 2020-10-24 | Disposition: A | Discharge status: Home / Self Care | Payer: Medicare Other | Attending: Family | Admitting: Family

## 2020-10-24 ENCOUNTER — Telehealth: Payer: Self-pay | Admitting: Family

## 2020-10-24 ENCOUNTER — Other Ambulatory Visit: Payer: Self-pay

## 2020-10-24 DIAGNOSIS — E876 Hypokalemia: Secondary | ICD-10-CM | POA: Diagnosis present

## 2020-10-24 LAB — BASIC METABOLIC PANEL
Anion gap: 13 (ref 5–15)
BUN: 40 mg/dL — ABNORMAL HIGH (ref 8–23)
CO2: 33 mmol/L — ABNORMAL HIGH (ref 22–32)
Calcium: 9.4 mg/dL (ref 8.9–10.3)
Chloride: 94 mmol/L — ABNORMAL LOW (ref 98–111)
Creatinine, Ser: 1.49 mg/dL — ABNORMAL HIGH (ref 0.44–1.00)
GFR, Estimated: 33 mL/min — ABNORMAL LOW (ref >60)
Glucose, Bld: 145 mg/dL — ABNORMAL HIGH (ref 70–99)
Potassium: 3 mmol/L — ABNORMAL LOW (ref 3.5–5.1)
Sodium: 140 mmol/L (ref 135–145)

## 2020-10-24 MED ORDER — POTASSIUM CHLORIDE CRYS ER 20 MEQ PO TBCR
40.0000 meq | EXTENDED_RELEASE_TABLET | Freq: Every day | ORAL | 5 refills | Status: DC
Start: 2020-10-24 — End: 2020-11-08

## 2020-10-24 NOTE — Telephone Encounter (Signed)
Potassium level remains low at 3.0 even after patient doubled her OTC potassium supplement. Renal function improving with GFR of 1.49.   Advised daughter, Raynelle Fanning, to stop the OTC potassium and will begin RX potassium as daily. Will recheck her BMP next week. Raynelle Fanning will check her calendar and let me know what day she can bring her in and the order will then be placed.

## 2020-10-29 ENCOUNTER — Telehealth: Payer: Self-pay | Admitting: Family

## 2020-10-29 NOTE — Telephone Encounter (Signed)
Called and spoke to daughter after receiving a message that patient has continued to take double torsemide and is still continuing to have fluid retention. After speaking with tina, I advised daughter to get her scheduled to follow up with Fath due to concern with watching kidneys as well.   Carlisle Torgeson, NT

## 2020-10-30 ENCOUNTER — Telehealth: Payer: Self-pay | Admitting: Family

## 2020-10-30 DIAGNOSIS — E876 Hypokalemia: Secondary | ICD-10-CM

## 2020-10-30 NOTE — Telephone Encounter (Signed)
Patient is now taking potassium daily. Order placed for BMP tomorrow.

## 2020-11-02 ENCOUNTER — Telehealth: Payer: Self-pay | Admitting: Family

## 2020-11-02 ENCOUNTER — Other Ambulatory Visit
Admission: RE | Admit: 2020-11-02 | Discharge: 2020-11-02 | Disposition: A | Discharge status: Home / Self Care | Payer: Medicare Other | Source: Ambulatory Visit | Attending: Cardiology | Admitting: Cardiology

## 2020-11-02 DIAGNOSIS — R0602 Shortness of breath: Secondary | ICD-10-CM | POA: Diagnosis present

## 2020-11-02 LAB — BRAIN NATRIURETIC PEPTIDE: B Natriuretic Peptide: 643.1 pg/mL — ABNORMAL HIGH (ref 0.0–100.0)

## 2020-11-02 NOTE — Telephone Encounter (Signed)
Patient's daughter, Raynelle Fanning, called to update on patient's condition. Raynelle Fanning says that patient saw Dr. Lady Gary 3 days ago and again earlier today. Lab work and CXR were done and she reviewed some of the lab work with me over the phone as I couldn't currently view the results.   She reports that patient's potassium is 3.5 and GFR 32. Hg 12.2 and platelet count has been steadily declining over the last few lab tests.   Raynelle Fanning says that patient feels very weak and tired. She said that even just walking to the car made her so weak that she had to go to bed. Not eating/ drinking much either. Raynelle Fanning was concerned because patient is not acting like her normal self and she's at a loss of what to do.   Mardene Sayer to not force food but if patient is not eating much to supplement with ensure or boost for the calories/ protein. Can drink some gatorade but no more than 20 ounces due to sodium content.  Mardene Sayer to f/u with PCP regarding declining platelet count. Also advised Raynelle Fanning that she knows her mom the best and if she looks worse over the weekend that I would recommend she take patient to the ED. Told her she had the option of [facility], [facility] [address] or [facility].   Raynelle Fanning verbalized understanding and appreciated the conversation.

## 2020-11-07 ENCOUNTER — Observation Stay (HOSPITAL_BASED_OUTPATIENT_CLINIC_OR_DEPARTMENT_OTHER)
Admission: EM | Admit: 2020-11-07 | Discharge: 2020-11-08 | Disposition: A | Discharge status: Home / Self Care | Payer: Medicare Other | Source: Home / Self Care | Attending: Emergency Medicine | Admitting: Emergency Medicine

## 2020-11-07 ENCOUNTER — Emergency Department: Payer: Medicare Other

## 2020-11-07 ENCOUNTER — Other Ambulatory Visit: Payer: Self-pay

## 2020-11-07 DIAGNOSIS — Z8673 Personal history of transient ischemic attack (TIA), and cerebral infarction without residual deficits: Secondary | ICD-10-CM | POA: Insufficient documentation

## 2020-11-07 DIAGNOSIS — I5042 Chronic combined systolic (congestive) and diastolic (congestive) heart failure: Secondary | ICD-10-CM | POA: Insufficient documentation

## 2020-11-07 DIAGNOSIS — N179 Acute kidney failure, unspecified: Secondary | ICD-10-CM | POA: Diagnosis not present

## 2020-11-07 DIAGNOSIS — Z7901 Long term (current) use of anticoagulants: Secondary | ICD-10-CM

## 2020-11-07 DIAGNOSIS — Z20822 Contact with and (suspected) exposure to covid-19: Secondary | ICD-10-CM | POA: Insufficient documentation

## 2020-11-07 DIAGNOSIS — Z79899 Other long term (current) drug therapy: Secondary | ICD-10-CM | POA: Insufficient documentation

## 2020-11-07 DIAGNOSIS — R410 Disorientation, unspecified: Secondary | ICD-10-CM | POA: Diagnosis not present

## 2020-11-07 DIAGNOSIS — Z8679 Personal history of other diseases of the circulatory system: Secondary | ICD-10-CM

## 2020-11-07 DIAGNOSIS — D696 Thrombocytopenia, unspecified: Secondary | ICD-10-CM

## 2020-11-07 DIAGNOSIS — I130 Hypertensive heart and chronic kidney disease with heart failure and stage 1 through stage 4 chronic kidney disease, or unspecified chronic kidney disease: Secondary | ICD-10-CM | POA: Insufficient documentation

## 2020-11-07 DIAGNOSIS — R531 Weakness: Secondary | ICD-10-CM

## 2020-11-07 DIAGNOSIS — Z96642 Presence of left artificial hip joint: Secondary | ICD-10-CM | POA: Insufficient documentation

## 2020-11-07 DIAGNOSIS — I890 Lymphedema, not elsewhere classified: Secondary | ICD-10-CM | POA: Diagnosis not present

## 2020-11-07 DIAGNOSIS — N1832 Chronic kidney disease, stage 3b: Secondary | ICD-10-CM | POA: Diagnosis not present

## 2020-11-07 DIAGNOSIS — E876 Hypokalemia: Secondary | ICD-10-CM | POA: Diagnosis not present

## 2020-11-07 LAB — MAGNESIUM: Magnesium: 2.4 mg/dL (ref 1.7–2.4)

## 2020-11-07 LAB — COMPREHENSIVE METABOLIC PANEL
ALT: 28 U/L (ref 0–44)
AST: 47 U/L — ABNORMAL HIGH (ref 15–41)
Albumin: 4.1 g/dL (ref 3.5–5.0)
Alkaline Phosphatase: 230 U/L — ABNORMAL HIGH (ref 38–126)
Anion gap: 9 (ref 5–15)
BUN: 46 mg/dL — ABNORMAL HIGH (ref 8–23)
CO2: 25 mmol/L (ref 22–32)
Calcium: 9.6 mg/dL (ref 8.9–10.3)
Chloride: 100 mmol/L (ref 98–111)
Creatinine, Ser: 1.78 mg/dL — ABNORMAL HIGH (ref 0.44–1.00)
GFR, Estimated: 26 mL/min — ABNORMAL LOW (ref >60)
Glucose, Bld: 101 mg/dL — ABNORMAL HIGH (ref 70–99)
Potassium: 5 mmol/L (ref 3.5–5.1)
Sodium: 134 mmol/L — ABNORMAL LOW (ref 135–145)
Total Bilirubin: 1.3 mg/dL — ABNORMAL HIGH (ref 0.3–1.2)
Total Protein: 7.2 g/dL (ref 6.5–8.1)

## 2020-11-07 LAB — CBC WITH DIFFERENTIAL/PLATELET
Abs Immature Granulocytes: 0.01 10*3/uL (ref 0.00–0.07)
Basophils Absolute: 0 10*3/uL (ref 0.0–0.1)
Basophils Relative: 1 %
Eosinophils Absolute: 0.1 10*3/uL (ref 0.0–0.5)
Eosinophils Relative: 2 %
HCT: 40.9 % (ref 36.0–46.0)
Hemoglobin: 13.1 g/dL (ref 12.0–15.0)
Immature Granulocytes: 0 %
Lymphocytes Relative: 26 %
Lymphs Abs: 1.1 10*3/uL (ref 0.7–4.0)
MCH: 29 pg (ref 26.0–34.0)
MCHC: 32 g/dL (ref 30.0–36.0)
MCV: 90.7 fL (ref 80.0–100.0)
Monocytes Absolute: 0.6 10*3/uL (ref 0.1–1.0)
Monocytes Relative: 14 %
Neutro Abs: 2.5 10*3/uL (ref 1.7–7.7)
Neutrophils Relative %: 57 %
Platelets: 121 10*3/uL — ABNORMAL LOW (ref 150–400)
RBC: 4.51 MIL/uL (ref 3.87–5.11)
RDW: 15.9 % — ABNORMAL HIGH (ref 11.5–15.5)
WBC: 4.4 10*3/uL (ref 4.0–10.5)
nRBC: 0 % (ref 0.0–0.2)

## 2020-11-07 LAB — TROPONIN I (HIGH SENSITIVITY)
Troponin I (High Sensitivity): 21 ng/L — ABNORMAL HIGH (ref <18)
Troponin I (High Sensitivity): 21 ng/L — ABNORMAL HIGH (ref <18)

## 2020-11-07 LAB — LACTIC ACID, PLASMA: Lactic Acid, Venous: 1.2 mmol/L (ref 0.5–1.9)

## 2020-11-07 LAB — RESP PANEL BY RT-PCR (FLU A&B, COVID) ARPGX2
Influenza A by PCR: NEGATIVE
Influenza B by PCR: NEGATIVE
SARS Coronavirus 2 by RT PCR: NEGATIVE

## 2020-11-07 LAB — BRAIN NATRIURETIC PEPTIDE: B Natriuretic Peptide: 555.4 pg/mL — ABNORMAL HIGH (ref 0.0–100.0)

## 2020-11-07 LAB — PROCALCITONIN: Procalcitonin: <0.1 ng/mL

## 2020-11-07 MED ORDER — SODIUM CHLORIDE 0.45 % IV SOLN: INTRAVENOUS | Status: DC

## 2020-11-07 MED ORDER — LACTATED RINGERS IV BOLUS: 500.0000 mL | Freq: Once | INTRAVENOUS | Status: DC

## 2020-11-07 MED ORDER — ADULT MULTIVITAMIN W/MINERALS CH
1.0000 | ORAL_TABLET | Freq: Every day | ORAL | Status: DC
  Administered 2020-11-08: 1 via ORAL
  Filled 2020-11-07: qty 1

## 2020-11-07 MED ORDER — ACETAMINOPHEN 325 MG PO TABS: 650.0000 mg | ORAL_TABLET | Freq: Four times a day (QID) | ORAL | Status: DC | PRN

## 2020-11-07 MED ORDER — SENNA 8.6 MG PO TABS
1.0000 | ORAL_TABLET | Freq: Two times a day (BID) | ORAL | Status: DC
  Administered 2020-11-07 – 2020-11-08 (×2): 8.6 mg via ORAL
  Filled 2020-11-07 (×2): qty 1

## 2020-11-07 MED ORDER — APIXABAN 2.5 MG PO TABS
2.5000 mg | ORAL_TABLET | Freq: Two times a day (BID) | ORAL | Status: DC
  Administered 2020-11-08: 2.5 mg via ORAL
  Filled 2020-11-07: qty 1

## 2020-11-07 MED ORDER — ATORVASTATIN CALCIUM 20 MG PO TABS
40.0000 mg | ORAL_TABLET | Freq: Every day | ORAL | Status: DC
  Administered 2020-11-07: 40 mg via ORAL
  Filled 2020-11-07: qty 2

## 2020-11-07 MED ORDER — SODIUM CHLORIDE 0.9 % IV SOLN
1.0000 g | INTRAVENOUS | Status: DC
  Administered 2020-11-07: 1 g via INTRAVENOUS
  Filled 2020-11-07 (×2): qty 10

## 2020-11-07 MED ORDER — ACETAMINOPHEN 650 MG RE SUPP: 650.0000 mg | Freq: Four times a day (QID) | RECTAL | Status: DC | PRN

## 2020-11-07 MED ORDER — DOCUSATE SODIUM 100 MG PO CAPS
100.0000 mg | ORAL_CAPSULE | Freq: Two times a day (BID) | ORAL | Status: DC
  Administered 2020-11-07 – 2020-11-08 (×2): 100 mg via ORAL
  Filled 2020-11-07 (×2): qty 1

## 2020-11-07 MED ORDER — APIXABAN 5 MG PO TABS
5.0000 mg | ORAL_TABLET | Freq: Two times a day (BID) | ORAL | Status: DC
Start: 2020-11-08 — End: 2020-11-07

## 2020-11-07 NOTE — ED Triage Notes (Signed)
Reported via EMS due to increased lethargy/fatigue. Patient has been treated with abx for infection, but family states she has been more lethargic and not as responsive. States that was told to hold lasix for dehydration but gained 5lb in 1 day.

## 2020-11-07 NOTE — H&P (Signed)
History and Physical    Colleen Carson DJT:[id number] DOB: [birth year] DOA: 11/07/2020  PCP: Derinda Late, MD  Patient coming from: home   Chief Complaint: weakness  HPI: Colleen Carson is a 87 y.o. female with medical history significant of atrial fibrillation on anticoagulation, lymphedema, stroke, CHF, pacemaker pain of weakness.  Patient was diagnosed with urinary tract infection a week ago per daughter and was given amoxicillin then switched to Augmentin yesterday. She had a recent CT scan abdomen and pelvis that revealed large stool burden no evidence of obstruction. She then saw her cardiologist Dr. Ubaldo Glassing for peripheral edema earlier in the week. She had torsemide increased but feels like she is not losing weight. Her weight was up 2 pounds per her scale at home. She then had reported weak and fatigued with dizziness while at cardiology office. Her vitals were stable. There was  concern over a possible overdiuresis. Her torsemide back to 20 mg daily and plan was recheck renal function. But today per daughter , pt was not feeling well, c/o weakness and not eating much. Was trying to hydrate before, but not much success. Pt herself reports dysuria and "discomfort". No cp. States has sob with anything , but does not appear to be acute.   Daughter also complains of patient having trouble swallowing.   ED Course: 115/76, afebrile, pulse 60, 98% on room air.  She is paced.  Creatinine up at 1.78.  Alk phos 230, AST 47.  Lactic acid 1.2, WBC 4.4, H&H stable, platelets 121 BNP 555.4 TP 21, procalcitonin less than 0.10  Review of Systems: All systems reviewed and otherwise negative.    Past Medical History:  Diagnosis Date  . Arthritis   . Atrial fibrillation ([place])   . CHF (congestive heart failure) ([place])   . Dysrhythmia    Afib  . Hypercholesteremia   . Hypertension   . Lymphedema    lower extremities  . S/P total hip arthroplasty 01/25/2015  . Shingles   . Stroke [facility])    noted  ischemia on CT scan    Past Surgical History:  Procedure Laterality Date  . JOINT REPLACEMENT  01/23/15   Left THR Dr. Rudene Christians   . PACEMAKER INSERTION  2017  . TOTAL HIP ARTHROPLASTY Left 01/23/2015   Procedure: TOTAL HIP ARTHROPLASTY ANTERIOR APPROACH;  Surgeon: Hessie Knows, MD;  Location: [facility] ORS;  Service: Orthopedics;  Laterality: Left;     reports that she has never smoked. She has never used smokeless tobacco. She reports that she does not drink alcohol and does not use drugs.  Allergies  Allergen Reactions  . Morphine Nausea Only    Hallucinations, nausea, vomitting  . Morphine And Related Nausea And Vomiting    Family History  Problem Relation Age of Onset  . Hypertension Other      Prior to Admission medications   Medication Sig Start Date End Date Taking? Authorizing Provider  apixaban (ELIQUIS) 5 MG TABS tablet Take 5 mg by mouth 2 (two) times daily.   Yes [provider]  atorvastatin (LIPITOR) 40 MG tablet Take 1 tablet (40 mg total) by mouth at bedtime. 01/16/18   Bettey Costa, MD  cetirizine (ZYRTEC) 10 MG tablet Take 10 mg by mouth daily.    [provider]  Cranberry 1000 MG CAPS Take by mouth daily.    [provider]  metolazone (ZAROXOLYN) 2.5 MG tablet Take 1 tablet (2.5 mg total) by mouth daily. Take 1/2 hour before morning torsemide  10/15/20 01/13/21  Alisa Graff, FNP  metoprolol tartrate (LOPRESSOR) 50 MG tablet Take 50 mg by mouth 2 (two) times daily.    [provider]  Multiple Vitamins-Minerals (CENTRUM SILVER 50+WOMEN PO) Take 1 tablet by mouth daily.    [provider]  potassium chloride SA (KLOR-CON) 20 MEQ tablet Take 2 tablets (40 mEq total) by mouth daily. 10/24/20   Alisa Graff, FNP  torsemide (DEMADEX) 20 MG tablet Take 2 tablets (40 mg total) by mouth daily. Patient taking differently: Take 20 mg by mouth daily. 12/07/19 04/18/20  Alisa Graff, FNP    Physical Exam: Vitals:   11/07/20 1534  11/07/20 1600 11/07/20 1630 11/07/20 1700  BP:  115/76 132/88 127/88  Pulse:  60 72 62  Resp:  _0 Temp:      TempSrc:      SpO2:  98% 99% 98%  Weight: 62 kg     Height: _1  (1.549 m)       Constitutional: NAD, calm, comfortable Vitals:   11/07/20 1534 11/07/20 1600 11/07/20 1630 11/07/20 1700  BP:  115/76 132/88 127/88  Pulse:  60 72 62  Resp:  _2 Temp:      TempSrc:      SpO2:  98% 99% 98%  Weight: 62 kg     Height: _3  (1.549 m)      Eyes: PERRL, lids and conjunctivae normal ENMT:mask on  Neck: no massess Respiratory: clear to auscultation bilaterally, no wheezing, no crackles. Normal respiratory effort. No accessory muscle use.  Cardiovascular: Regular rate and rhythm, no murmurs / rubs / gallops.Lymphedema with mild edema overall.  Abdomen: no tenderness, no masses palpated. Bowel sounds positive.  Musculoskeletal: no clubbing / cyanosis. No joint deformity upper and lower extremities. Good ROM, no contractures. Normal muscle tone.  Skin: no rashes, lesions, ulcers. No induration Neurologic: CN 2-12 grossly intact. AAxox3 Psychiatric: Normal judgment and insight. Alert and oriented x 3. Normal mood.    Labs on Admission: I have personally reviewed following labs and imaging studies  CBC: Recent Labs  Lab 11/07/20 1542  WBC 4.4  NEUTROABS 2.5  HGB 13.1  HCT 40.9  MCV 90.7  PLT 812*   Basic Metabolic Panel: Recent Labs  Lab 11/07/20 1542  NA 134*  K 5.0  CL 100  CO2 25  GLUCOSE 101*  BUN 46*  CREATININE 1.78*  CALCIUM 9.6  MG 2.4   GFR: Estimated Creatinine Clearance: 16.7 mL/min (A) (by C-G formula based on SCr of 1.78 mg/dL (H)). Liver Function Tests: Recent Labs  Lab 11/07/20 1542  AST 47*  ALT 28  ALKPHOS 230*  BILITOT 1.3*  PROT 7.2  ALBUMIN 4.1   No results for input(s): LIPASE, AMYLASE in the last 168 hours. No results for input(s): AMMONIA in the last 168 hours. Coagulation Profile: No results for input(s): INR,  PROTIME in the last 168 hours. Cardiac Enzymes: No results for input(s): CKTOTAL, CKMB, CKMBINDEX, TROPONINI in the last 168 hours. BNP (last 3 results) No results for input(s): PROBNP in the last 8760 hours. HbA1C: No results for input(s): HGBA1C in the last 72 hours. CBG: No results for input(s): GLUCAP in the last 168 hours. Lipid Profile: No results for input(s): CHOL, HDL, LDLCALC, TRIG, CHOLHDL, LDLDIRECT in the last 72 hours. Thyroid Function Tests: No results for input(s): TSH, T4TOTAL, FREET4, T3FREE, THYROIDAB in the last 72 hours. Anemia Panel: No results for input(s): VITAMINB12, FOLATE, FERRITIN,  TIBC, IRON, RETICCTPCT in the last 72 hours. Urine analysis:    Component Value Date/Time   COLORURINE RED (A) 04/09/2020 0238   APPEARANCEUR TURBID (A) 04/09/2020 0238   APPEARANCEUR Hazy 01/10/2015 1349   LABSPEC 1.009 04/09/2020 0238   LABSPEC 1.026 01/10/2015 1349   PHURINE  04/09/2020 0238    TEST NOT REPORTED DUE TO COLOR INTERFERENCE OF URINE PIGMENT   GLUCOSEU (A) 04/09/2020 0238    TEST NOT REPORTED DUE TO COLOR INTERFERENCE OF URINE PIGMENT   GLUCOSEU Negative 01/10/2015 1349   HGBUR (A) 04/09/2020 0238    TEST NOT REPORTED DUE TO COLOR INTERFERENCE OF URINE PIGMENT   BILIRUBINUR (A) 04/09/2020 0238    TEST NOT REPORTED DUE TO COLOR INTERFERENCE OF URINE PIGMENT   BILIRUBINUR Negative 01/10/2015 1349   KETONESUR (A) 04/09/2020 0238    TEST NOT REPORTED DUE TO COLOR INTERFERENCE OF URINE PIGMENT   PROTEINUR (A) 04/09/2020 0238    TEST NOT REPORTED DUE TO COLOR INTERFERENCE OF URINE PIGMENT   NITRITE (A) 04/09/2020 0238    TEST NOT REPORTED DUE TO COLOR INTERFERENCE OF URINE PIGMENT   LEUKOCYTESUR (A) 04/09/2020 0238    TEST NOT REPORTED DUE TO COLOR INTERFERENCE OF URINE PIGMENT   LEUKOCYTESUR 1+ 01/10/2015 1349    Radiological Exams on Admission: DG Chest 1 View  Result Date: 11/07/2020 CLINICAL DATA:  Cough, lethargy EXAM: CHEST  1 VIEW COMPARISON:   04/01/2020 FINDINGS: Single frontal view of the chest demonstrates an enlarged cardiac silhouette. Stable atherosclerosis of the aorta. Diffuse interstitial scarring again noted unchanged. No focal airspace disease, effusion, or pneumothorax. IMPRESSION: 1. Enlarged cardiac silhouette, stable. 2. Chronic interstitial scarring, without acute airspace disease. Electronically Signed   By: Randa Ngo M.D.   On: 11/07/2020 15:57    EKG: Independently reviewed.appears paced, with afib  Assessment/Plan Active Problems:   AKI (acute kidney injury) ([place])    1. Weakness- likely 2/2 dehydration, overdiuresis. Will give gently hydration PT/OT Hold diuretics  2. AKI on ckd stage IIIb-prerenal due to over diuresing and decreased p.o. intake Of IV fluids at 50 MLS per hour Diuretics Monitor levels  3.Chronic Systolic HF Lymphedema Echo from 3/9-EF 45 to 50% Compensated and euvolemic on exam Recently her torsemide was decreased due to over diuresing We will continue hold Daily weight I's and O's Monitor volume status as patient is being gently hydrated  4.UTI- recently dx. Still with symptoms.  Will ck ua, ucx Start empiric IV Rocephin Check bladder scan do I's and O's if holding to 50 cc or more on scan  5. Dysphagia-reported per daughter Will place on dysphagia diet for now Consult SPL for swallowing evalve  6. H/xo afib- on Eliquis Continue  7. Chronic thrombocytopenia monitor  DVT prophylaxis: SCD, Eliquis  Code Status: DNR confirmed with pt and daughter Family Communication: Daughter at bedside.   Disposition Plan: home  Consults called: none  Admission status: observation, as pt requires <2MN stays for current medical illness    Nolberto Hanlon MD [facility]   If 7PM-7AM, please contact night-coverage [email or web address] Password [facility]  11/07/2020, 6:09 PM

## 2020-11-07 NOTE — ED Provider Notes (Signed)
[facility] Emergency Department Provider Note  ____________________________________________   Event Date/Time   First MD Initiated Contact with Patient 11/07/20 1518     (approximate)  I have reviewed the triage vital signs and the nursing notes.   HISTORY  Chief Complaint Fatigue   HPI Colleen Carson is a 87 y.o. female with a past medical history of A. fib on Eliquis and rate controlled on metoprolol, arthritis, CHF, HTN, and recent diagnosis of colitis and UTI on 2/13 discharged on amoxicillin switched to Augmentin yesterday on 2/15 and recent discontinuation of torsemide yesterday due to concern for possible overdiuresis who presents from home for assessment of worsening generalized fatigue and weakness, cough, some right-sided chest discomfort and diarrhea and some burning with urination.  She states that she has been feeling poorly over the last couple of days but feels little bit worse today.         Past Medical History:  Diagnosis Date  . Arthritis   . Atrial fibrillation ([facility])   . CHF (congestive heart failure) ([facility])   . Dysrhythmia    Afib  . Hypercholesteremia   . Hypertension   . Lymphedema    lower extremities  . S/P total hip arthroplasty 01/25/2015  . Shingles   . Stroke [facility])    noted ischemia on CT scan    Patient Active Problem List   Diagnosis Date Noted  . Acute on chronic diastolic heart failure (HCC) 11/29/2019  . Chronic diastolic heart failure (HCC) 05/17/2018  . Chronic systolic heart failure (HCC) 03/04/2017  . Anxiety 03/04/2017  . TIA (transient ischemic attack) 12/24/2016  . Acute encephalopathy 10/01/2016  . Symptomatic bradycardia 05/15/2016  . HTN (hypertension) 05/15/2016  . HLD (hyperlipidemia) 05/15/2016  . Atrial fibrillation (H[facility]) 03/11/2016  . AKI (acute kidney injury) ([facility]) 05/06/2015  . S/P total hip arthroplasty 01/25/2015  . Primary osteoarthritis of left hip 01/23/2015    Past Surgical  History:  Procedure Laterality Date  . JOINT REPLACEMENT  01/23/15   Left THR Dr. Rosita Kea   . PACEMAKER INSERTION  2017  . TOTAL HIP ARTHROPLASTY Left 01/23/2015   Procedure: TOTAL HIP ARTHROPLASTY ANTERIOR APPROACH;  Surgeon: Kennedy Bucker, MD;  Location: [facility] ORS;  Service: Orthopedics;  Laterality: Left;    Prior to Admission medications   Medication Sig Start Date End Date Taking? Authorizing Provider  apixaban (ELIQUIS) 5 MG TABS tablet Take 5 mg by mouth 2 (two) times daily.    [provider]  atorvastatin (LIPITOR) 40 MG tablet Take 1 tablet (40 mg total) by mouth at bedtime. 01/16/18   Adrian Saran, MD  cetirizine (ZYRTEC) 10 MG tablet Take 10 mg by mouth daily.    [provider]  Cranberry 1000 MG CAPS Take by mouth daily.    [provider]  metolazone (ZAROXOLYN) 2.5 MG tablet Take 1 tablet (2.5 mg total) by mouth daily. Take 1/2 hour before morning torsemide 10/15/20 01/13/21  Clarisa Kindred A, FNP  metoprolol tartrate (LOPRESSOR) 50 MG tablet Take 50 mg by mouth 2 (two) times daily.    [provider]  Multiple Vitamins-Minerals (CENTRUM SILVER 50+WOMEN PO) Take 1 tablet by mouth daily.    [provider]  potassium chloride SA (KLOR-CON) 20 MEQ tablet Take 2 tablets (40 mEq total) by mouth daily. 10/24/20   Delma Freeze, FNP  torsemide (DEMADEX) 20 MG tablet Take 2 tablets (40 mg total) by mouth daily. Patient taking differently: Take 20 mg by mouth  daily. 12/07/19 04/18/20  Delma Freeze, FNP    Allergies Morphine and Morphine and related  Family History  Problem Relation Age of Onset  . Hypertension Other     Social History Social History   Tobacco Use  . Smoking status: Never Smoker  . Smokeless tobacco: Never Used  Vaping Use  . Vaping Use: Never used  Substance Use Topics  . Alcohol use: No  . Drug use: No    Review of Systems  Review of Systems  Constitutional: Positive for malaise/fatigue. Negative for chills and  fever.  HENT: Negative for sore throat.   Eyes: Negative for pain.  Respiratory: Positive for cough. Negative for stridor.   Cardiovascular: Positive for chest pain.  Gastrointestinal: Positive for diarrhea. Negative for vomiting.  Genitourinary: Negative for dysuria.  Musculoskeletal: Negative for myalgias.  Skin: Negative for rash.  Neurological: Negative for seizures, loss of consciousness and headaches.  Psychiatric/Behavioral: Negative for suicidal ideas.  All other systems reviewed and are negative.     ____________________________________________   PHYSICAL EXAM:  VITAL SIGNS: ED Triage Vitals  Enc Vitals Group     BP      Pulse      Resp      Temp      Temp src      SpO2      Weight      Height      Head Circumference      Peak Flow      Pain Score      Pain Loc      Pain Edu?      Excl. in GC?    Vitals:   11/07/20 1630 11/07/20 1700  BP: 132/88 127/88  Pulse: 72 62  Resp: 15 11  Temp:    SpO2: 99% 98%   Physical Exam Vitals and nursing note reviewed.  Constitutional:      General: She is not in acute distress.    Appearance: She is well-developed and well-nourished.  HENT:     Head: Normocephalic and atraumatic.     Right Ear: External ear normal.     Left Ear: External ear normal.     Nose: Nose normal.     Mouth/Throat:     Mouth: Mucous membranes are dry.  Eyes:     Conjunctiva/sclera: Conjunctivae normal.  Cardiovascular:     Rate and Rhythm: Normal rate and regular rhythm.     Heart sounds: No murmur heard.   Pulmonary:     Effort: Pulmonary effort is normal. No respiratory distress.     Breath sounds: Normal breath sounds.  Abdominal:     Palpations: Abdomen is soft.     Tenderness: There is no abdominal tenderness.  Musculoskeletal:     Cervical back: Neck supple.     Right lower leg: Edema present.     Left lower leg: Edema present.  Skin:    General: Skin is warm and dry.     Capillary Refill: Capillary refill takes 2 to  3 seconds.  Neurological:     Mental Status: She is alert and oriented to person, place, and time.  Psychiatric:        Mood and Affect: Mood and affect and mood normal.      ____________________________________________   LABS (all labs ordered are listed, but only abnormal results are displayed)  Labs Reviewed  CBC WITH DIFFERENTIAL/PLATELET - Abnormal; Notable for the following components:      Result Value  RDW 15.9 (*)    Platelets 121 (*)    All other components within normal limits  COMPREHENSIVE METABOLIC PANEL - Abnormal; Notable for the following components:   Sodium 134 (*)    Glucose, Bld 101 (*)    BUN 46 (*)    Creatinine, Ser 1.78 (*)    AST 47 (*)    Alkaline Phosphatase 230 (*)    Total Bilirubin 1.3 (*)    GFR, Estimated 26 (*)    All other components within normal limits  BRAIN NATRIURETIC PEPTIDE - Abnormal; Notable for the following components:   B Natriuretic Peptide 555.4 (*)    All other components within normal limits  TROPONIN I (HIGH SENSITIVITY) - Abnormal; Notable for the following components:   Troponin I (High Sensitivity) 21 (*)    All other components within normal limits  RESP PANEL BY RT-PCR (FLU A&B, COVID) ARPGX2  LACTIC ACID, PLASMA  MAGNESIUM  PROCALCITONIN  URINALYSIS, COMPLETE (UACMP) WITH MICROSCOPIC  LACTIC ACID, PLASMA  TROPONIN I (HIGH SENSITIVITY)   ____________________________________________  EKG  Paced rhythm with a ventricular rate of 63, LVH and otherwise unremarkable QTc interval.  Nonspecific ST change in lead I and aVL.  These changes were present on ECG obtained on 04/03/2020. ____________________________________________  RADIOLOGY  ED MD interpretation: Cardiomegaly without overt edema, large effusion, pneumothorax, focal consolidation or any other clear acute intrathoracic process.  Official radiology report(s): DG Chest 1 View  Result Date: 11/07/2020 CLINICAL DATA:  Cough, lethargy EXAM: CHEST  1 VIEW  COMPARISON:  04/01/2020 FINDINGS: Single frontal view of the chest demonstrates an enlarged cardiac silhouette. Stable atherosclerosis of the aorta. Diffuse interstitial scarring again noted unchanged. No focal airspace disease, effusion, or pneumothorax. IMPRESSION: 1. Enlarged cardiac silhouette, stable. 2. Chronic interstitial scarring, without acute airspace disease. Electronically Signed   By: Sharlet SalinaMichael  Brown M.D.   On: 11/07/2020 15:57    ____________________________________________   PROCEDURES  Procedure(s) performed (including Critical Care):  Procedures   ____________________________________________   INITIAL IMPRESSION / ASSESSMENT AND PLAN / ED COURSE      Patient presents with above to history exam for assessment of cough some right-sided chest discomfort worsening malaise and fatigue as well as persistent diarrhea) with urination after recent diagnosis of UTI and colitis as well as recent discontinuation of her torsemide.  On arrival she is afebrile hemodynamically stable.  She is oriented x4.  She is nonfocal neuro exam and while she is quite edematous she does not have any significant rales in her lungs.  Regard to patient's cough and right-sided chest pain differential includes pneumothorax, pneumonia, PE, pleurisy, ACS, arrhythmia, symptomatic anemia, chest wall etiologies and viral bronchitis.  Seems that she is still experiencing some symptoms for recent diagnosis of UTI and colitis.  Over her abdomen is soft nontender and she has not had any recent vomiting and has been tolerating p.o.  Chest x-ray shows no pneumothorax, full consolidation suggestive of pneumonia, effusion, edema or any other clear acute intrathoracic process.  ECG shows paced rhythm without any new or clear ischemic changes and given nonelevated troponin x2 Evalose patient for ACS at this time.  Low suspicion for acute infectious process given absence of fever, elevated white blood cell count and  undetectable procalcitonin.  Covid is negative.  Lactic acid is nonelevated and given stable vital signs with no white count or fever a very low suspicion for sepsis or serious invasive bacterial infection at this time.    CBC shows no leukocytosis  or acute anemia.  CMP shows evidence of AKI with a creatinine of 1.78 compared to 1.492 weeks ago.  Seems patient's baseline is around 1.2.  No other significant electrolyte or metabolic derangements.  Troponin is just above normal at 21 which is back some mild demand in the setting of dehydration.  BNP is close to baseline at 550 compared to 643 5 days ago and 4 6111 months ago.  Suspect mild dehydration.  Discussed with cardiology Dr. Darrold Junker who recommended continue holding patient's torsemide at this time.  Also discussed patient's presentation work-up and concern for dehydration with daughter states that patient lives with her and her husband but they do not feel they can adequately take care of her while she is getting over this UTI given she is very weak and unable to ambulate safely to the bathroom on her own or take care of herself.  Given these concerns and AKI we will plan admit to observation medicine service.      ____________________________________________   FINAL CLINICAL IMPRESSION(S) / ED DIAGNOSES  Final diagnoses:  AKI (acute kidney injury) (HCC)  Weakness    Medications  lactated ringers bolus 500 mL (has no administration in time range)     ED Discharge Orders    None       Note:  This document was prepared using Dragon voice recognition software and may include unintentional dictation errors.   Gilles Chiquito, MD 11/07/20 [phone]

## 2020-11-07 NOTE — ED Notes (Signed)
Contacted lab to draw bloodwork.

## 2020-11-08 DIAGNOSIS — N179 Acute kidney failure, unspecified: Secondary | ICD-10-CM

## 2020-11-08 DIAGNOSIS — R531 Weakness: Secondary | ICD-10-CM

## 2020-11-08 DIAGNOSIS — D696 Thrombocytopenia, unspecified: Secondary | ICD-10-CM

## 2020-11-08 DIAGNOSIS — N189 Chronic kidney disease, unspecified: Secondary | ICD-10-CM

## 2020-11-08 DIAGNOSIS — Z7901 Long term (current) use of anticoagulants: Secondary | ICD-10-CM

## 2020-11-08 DIAGNOSIS — R5383 Other fatigue: Secondary | ICD-10-CM

## 2020-11-08 DIAGNOSIS — Z87442 Personal history of urinary calculi: Secondary | ICD-10-CM

## 2020-11-08 DIAGNOSIS — I509 Heart failure, unspecified: Secondary | ICD-10-CM

## 2020-11-08 DIAGNOSIS — I890 Lymphedema, not elsewhere classified: Secondary | ICD-10-CM

## 2020-11-08 DIAGNOSIS — E860 Dehydration: Secondary | ICD-10-CM | POA: Diagnosis not present

## 2020-11-08 DIAGNOSIS — Z8673 Personal history of transient ischemic attack (TIA), and cerebral infarction without residual deficits: Secondary | ICD-10-CM

## 2020-11-08 LAB — BASIC METABOLIC PANEL
Anion gap: 13 (ref 5–15)
BUN: 43 mg/dL — ABNORMAL HIGH (ref 8–23)
CO2: 21 mmol/L — ABNORMAL LOW (ref 22–32)
Calcium: 9.6 mg/dL (ref 8.9–10.3)
Chloride: 101 mmol/L (ref 98–111)
Creatinine, Ser: 1.61 mg/dL — ABNORMAL HIGH (ref 0.44–1.00)
GFR, Estimated: 30 mL/min — ABNORMAL LOW (ref >60)
Glucose, Bld: 82 mg/dL (ref 70–99)
Potassium: 5.3 mmol/L — ABNORMAL HIGH (ref 3.5–5.1)
Sodium: 135 mmol/L (ref 135–145)

## 2020-11-08 LAB — FOLATE: Folate: 32 ng/mL (ref >5.9)

## 2020-11-08 LAB — CBC
HCT: 38.7 % (ref 36.0–46.0)
Hemoglobin: 13 g/dL (ref 12.0–15.0)
MCH: 29.5 pg (ref 26.0–34.0)
MCHC: 33.6 g/dL (ref 30.0–36.0)
MCV: 87.8 fL (ref 80.0–100.0)
Platelets: 117 10*3/uL — ABNORMAL LOW (ref 150–400)
RBC: 4.41 MIL/uL (ref 3.87–5.11)
RDW: 15.8 % — ABNORMAL HIGH (ref 11.5–15.5)
WBC: 4.9 10*3/uL (ref 4.0–10.5)
nRBC: 0 % (ref 0.0–0.2)

## 2020-11-08 LAB — URINALYSIS, COMPLETE (UACMP) WITH MICROSCOPIC
Bacteria, UA: NONE SEEN
Bilirubin Urine: NEGATIVE
Glucose, UA: NEGATIVE mg/dL
Hgb urine dipstick: NEGATIVE
Ketones, ur: NEGATIVE mg/dL
Leukocytes,Ua: NEGATIVE
Nitrite: NEGATIVE
Protein, ur: 100 mg/dL — AB
Specific Gravity, Urine: 1.015 (ref 1.005–1.030)
pH: 6 (ref 5.0–8.0)

## 2020-11-08 LAB — TROPONIN I (HIGH SENSITIVITY)
Troponin I (High Sensitivity): 25 ng/L — ABNORMAL HIGH (ref <18)
Troponin I (High Sensitivity): 22 ng/L — ABNORMAL HIGH (ref <18)

## 2020-11-08 LAB — PATHOLOGIST SMEAR REVIEW

## 2020-11-08 LAB — VITAMIN B12: Vitamin B-12: 1452 pg/mL — ABNORMAL HIGH (ref 180–914)

## 2020-11-08 LAB — HEPATITIS PANEL, ACUTE
HCV Ab: NONREACTIVE
Hep A IgM: NONREACTIVE
Hep B C IgM: NONREACTIVE
Hepatitis B Surface Ag: NONREACTIVE

## 2020-11-08 LAB — POTASSIUM: Potassium: 4.5 mmol/L (ref 3.5–5.1)

## 2020-11-08 LAB — IMMATURE PLATELET FRACTION: Immature Platelet Fraction: 8.1 % (ref 1.2–8.6)

## 2020-11-08 LAB — LACTIC ACID, PLASMA: Lactic Acid, Venous: 1.4 mmol/L (ref 0.5–1.9)

## 2020-11-08 MED ORDER — APIXABAN 2.5 MG PO TABS: 2.5000 mg | ORAL_TABLET | Freq: Two times a day (BID) | ORAL | 0 refills | Status: AC

## 2020-11-08 MED ORDER — SODIUM POLYSTYRENE SULFONATE 15 GM/60ML PO SUSP
30.0000 g | Freq: Once | ORAL | Status: AC
  Administered 2020-11-08: 30 g via ORAL
  Filled 2020-11-08 (×2): qty 120

## 2020-11-08 NOTE — TOC Initial Note (Signed)
Transition of Care [facility]) - Initial/Assessment Note    Patient Details  Name: Colleen Carson MRN: [id number] Date of Birth: April 19, 1925  Transition of Care [facility]) CM/SW Contact:    Trenton Founds, RN Phone Number: 11/08/2020, 2:17 PM  Clinical Narrative:   RNCM attempted to meet with patient in room however she was up in bathroom and daughter was contacted. Patient lives at home with daughter and son in law and is normally relatively independent at baseline. Daughter verbalizes agreement to recommendations for rolling walker and 3N1 as well as home health, she does not have a preference as to agency.  RNCM reached out to [facility] with [facility] for equipment.  RNCM reached out to [place] with [facility] for home health.              Expected Discharge Plan: Home w Home Health Services Barriers to Discharge: No Barriers Identified   Patient Goals and CMS Choice        Expected Discharge Plan and Services Expected Discharge Plan: Home w Home Health Services       Living arrangements for the past 2 months: Single Family Home Expected Discharge Date: 11/08/20               DME Arranged: 3-N-1,Walker rolling DME Agency: [facility] Date DME Agency Contacted: 11/08/20 Time DME Agency Contacted: 1415 Representative spoke with at DME Agency: Elease Hashimoto HH Arranged: PT,OT HH Agency: [facility] Date HH Agency Contacted: 11/08/20 Time HH Agency Contacted: 1415 Representative spoke with at [facility] Agency: Barbara Cower  Prior Living Arrangements/Services Living arrangements for the past 2 months: Single Family Home Lives with:: Adult Children Patient language and need for interpreter reviewed:: Yes Do you feel safe going back to the place where you live?: Yes      Need for Family Participation in Patient Care: Yes (Comment) Care giver support system in place?: Yes (comment)   Criminal Activity/Legal Involvement Pertinent to Current Situation/Hospitalization: No - Comment as  needed  Activities of Daily Living Home Assistive Devices/Equipment: None ADL Screening (condition at time of admission) Patient's cognitive ability adequate to safely complete daily activities?: Yes Is the patient deaf or have difficulty hearing?: No Does the patient have difficulty seeing, even when wearing glasses/contacts?: No Does the patient have difficulty concentrating, remembering, or making decisions?: No Patient able to express need for assistance with ADLs?: Yes Does the patient have difficulty dressing or bathing?: No Independently performs ADLs?: Yes (appropriate for developmental age) Does the patient have difficulty walking or climbing stairs?: No Weakness of Legs: None Weakness of Arms/Hands: None  Permission Sought/Granted                  Emotional Assessment Appearance:: Appears stated age       Alcohol / Substance Use: Not Applicable Psych Involvement: No (comment)  Admission diagnosis:  Weakness [R53.1] AKI (acute kidney injury) (HCC) [N17.9] Patient Active Problem List   Diagnosis Date Noted  . Weakness   . Thrombocytopenia (HCC)   . Chronic anticoagulation   . Acute on chronic diastolic heart failure (HCC) 11/29/2019  . Chronic diastolic heart failure (HCC) 05/17/2018  . Chronic systolic heart failure (HCC) 03/04/2017  . Anxiety 03/04/2017  . TIA (transient ischemic attack) 12/24/2016  . Acute encephalopathy 10/01/2016  . Symptomatic bradycardia 05/15/2016  . HTN (hypertension) 05/15/2016  . HLD (hyperlipidemia) 05/15/2016  . Atrial fibrillation (HCC) 03/11/2016  . AKI (acute kidney injury) (HCC) 05/06/2015  . S/P total hip arthroplasty 01/25/2015  .  Primary osteoarthritis of left hip 01/23/2015   PCP:  Kandyce Rud, MD Pharmacy:   [facility] [address] [place] - [address] [place] [address] Phone: [phone] Fax: [phone]  [id number] - [place] [facility] - [address] [place] [address] Phone: [phone] Fax: [phone]     Social Determinants of Health (SDOH) Interventions    Readmission Risk Interventions No flowsheet data found.

## 2020-11-08 NOTE — Evaluation (Signed)
Physical Therapy Evaluation Patient Details Name: Colleen Carson MRN: [id number] DOB: 05-Sep-1925 Today's Date: 11/08/2020   History of Present Illness  presented to ER secondary to progressive weakness, decreased PO and increased weight gait; admitted for management of dehydration, over-diuresis  Clinical Impression  Upon evaluation, patient alert and oriented; follows commands and agreeable to session.  Denies pain and endorses general improvement since admission.  Bilat UE/LE strength and ROM grossly symmetrical and WFL; no focal weakness appreciated.  Able to complete bed mobility with mod indep; sit/stand, basic transfers and gait (200') without assist device, cga/close sup. Demonstrates reciprocal stepping pattern with fair step height/length; fair cadence; mild sway/lateral LOB with head turns and dynamic gait components (relies on LE step strategy for balance recovery).  Single standing rest break to complete distance; BORG 7/10 end of trial.  Sats 95%, HR 70s with exertion.  Do recommend continued use of RW for longer distances to optimize safety, maximize energy conservation; patient voiced agreement and understanding. Would benefit from skilled PT to address above deficits and promote optimal return to PLOF.; Recommend transition to HHPT upon discharge from acute hospitalization.     Follow Up Recommendations Home health PT    Equipment Recommendations       Recommendations for Other Services       Precautions / Restrictions Precautions Precautions: Fall Restrictions Weight Bearing Restrictions: No      Mobility  Bed Mobility Overal bed mobility: Independent                  Transfers Overall transfer level: Needs assistance   Transfers: Sit to/from Stand Sit to Stand: Supervision            Ambulation/Gait Ambulation/Gait assistance: Min guard Gait Distance (Feet): 200 Feet Assistive device: None       General Gait Details: reciprocal stepping  pattern with fair step height/length; fair cadence; mild sway/lateral LOB with head turns and dynamic gait components (relies on LE step strategy for balance recovery).  Single standing rest break to complete distance; BORG 7/10 end of trial.  Sats 95%, HR 70s with exertion.  Do recommend continued use of RW for longer distances to optimize safety, maximize energy conservation; patient voiced agreement and understanding.  Stairs            Wheelchair Mobility    Modified Rankin (Stroke Patients Only)       Balance Overall balance assessment: Needs assistance Sitting-balance support: No upper extremity supported;Feet supported Sitting balance-Leahy Scale: Good     Standing balance support: Bilateral upper extremity supported Standing balance-Leahy Scale: Fair                               Pertinent Vitals/Pain Pain Assessment: No/denies pain    Home Living Family/patient expects to be discharged to:: Private residence Living Arrangements: Children;Spouse/significant other Available Help at Discharge: Family;Available PRN/intermittently Type of Home: House Home Access: Stairs to enter   Entrance Stairs-Number of Steps: 3 Home Layout: Two level;Able to live on main level with bedroom/bathroom Home Equipment: Walker - 4 wheels      Prior Function Level of Independence: Independent         Comments: Indep with ADLs, household mobilization; does use 4wRW for longer, outdoor distances. Denies fall history.     Hand Dominance        Extremity/Trunk Assessment   Upper Extremity Assessment Upper Extremity Assessment: Overall WFL for tasks assessed  Lower Extremity Assessment Lower Extremity Assessment: Overall WFL for tasks assessed (baseline lymphadema bilat LEs)       Communication   Communication: No difficulties  Cognition Arousal/Alertness: Awake/alert Behavior During Therapy: WFL for tasks assessed/performed Overall Cognitive Status:  Within Functional Limits for tasks assessed                                        General Comments      Exercises     Assessment/Plan    PT Assessment Patient needs continued PT services  PT Problem List Decreased activity tolerance;Decreased mobility;Cardiopulmonary status limiting activity;Decreased safety awareness;Decreased knowledge of precautions;Decreased knowledge of use of DME;Decreased balance       PT Treatment Interventions DME instruction;Gait training;Stair training;Functional mobility training;Therapeutic activities;Therapeutic exercise;Balance training;Patient/family education    PT Goals (Current goals can be found in the Care Plan section)  Acute Rehab PT Goals Patient Stated Goal: to return home PT Goal Formulation: With patient Time For Goal Achievement: 11/22/20 Potential to Achieve Goals: Good    Frequency Min 2X/week   Barriers to discharge        Co-evaluation               AM-PAC PT "6 Clicks" Mobility  Outcome Measure Help needed turning from your back to your side while in a flat bed without using bedrails?: None Help needed moving from lying on your back to sitting on the side of a flat bed without using bedrails?: None Help needed moving to and from a bed to a chair (including a wheelchair)?: A Little Help needed standing up from a chair using your arms (e.g., wheelchair or bedside chair)?: A Little Help needed to walk in hospital room?: A Little Help needed climbing 3-5 steps with a railing? : A Little 6 Click Score: 20    End of Session Equipment Utilized During Treatment: Gait belt Activity Tolerance: Patient tolerated treatment well Patient left: in chair;with call bell/phone within reach;with chair alarm set Nurse Communication: Mobility status PT Visit Diagnosis: Muscle weakness (generalized) (M62.81);Difficulty in walking, not elsewhere classified (R26.2)    Time: 6606-3016 PT Time Calculation (min) (ACUTE  ONLY): 19 min   Charges:   PT Evaluation $PT Eval Moderate Complexity: 1 Mod          Corky Blumstein H. Manson Passey, PT, DPT, NCS 11/08/20, 9:42 AM [phone]

## 2020-11-08 NOTE — Plan of Care (Signed)

## 2020-11-08 NOTE — Plan of Care (Signed)
New care plan initiated 

## 2020-11-08 NOTE — Progress Notes (Signed)
Clinical/Bedside Swallow Evaluation Patient Details  Name: Colleen Carson MRN: [id number] Date of Birth: 11/04/1924  Today's Date: 11/08/2020 Time: SLP Start Time (ACUTE ONLY): 1145 SLP Stop Time (ACUTE ONLY): 1233 SLP Time Calculation (min) (ACUTE ONLY): 48 min  Past Medical History:  Past Medical History:  Diagnosis Date   Arthritis    Atrial fibrillation (HCC)    CHF (congestive heart failure) (HCC)    Dysrhythmia    Afib   Hypercholesteremia    Hypertension    Lymphedema    lower extremities   S/P total hip arthroplasty 01/25/2015   Shingles    Stroke [facility])    noted ischemia on CT scan   Past Surgical History:  Past Surgical History:  Procedure Laterality Date   JOINT REPLACEMENT  01/23/15   Left THR Dr. Rosita Kea    PACEMAKER INSERTION  2017   TOTAL HIP ARTHROPLASTY Left 01/23/2015   Procedure: TOTAL HIP ARTHROPLASTY ANTERIOR APPROACH;  Surgeon: Kennedy Bucker, MD;  Location: [facility] ORS;  Service: Orthopedics;  Laterality: Left;   HPI:  Pt is 87 y.o. female who presented with weakness, decreased PO intake, weight gain and admitted for management of dehydration, over-diuresis.   Assessment / Plan / Recommendation Clinical Impression  Patient presents with appearance of adequate airway protection with thin liquids and soft solids. Consumed 6 oz thin liquid via straw, puree, and solid graham cracker without overt signs of aspiration. Oral control, mastication, and clearance are function, swallow appears timely, vital signs remain stable. Her primary complaint is globus sensation with solids, particularly meat and bread; she reports this improves when she drinks liquids. Reports liquids make her cough occasionally, however she consumed thin liquids consecutively via straw with appearance of timely swallow initiation and adequate airway protection. Patient ordered a tuna salad sandwich. As SLP documented outside of room, noted coughing from pt's room. Returned to room and pt gestured to area  of thyroid notch and reported feeling bread was "sticking." This reduced as SLP cued pt to take sips of water. Subsequently, no further coughing when pt alternated solids and liquids. Patient is deemed to be at low risk for aspiration as she remains ambulatory, no history of pneumonia, and is able to self-feed. Discussed with daughter over the phone possibility of completing objective testing of swallow function if difficulties persist using compensations (upright posture, slow rate, alternate solids and liquids, moisten dry foods, avoid troublesome foods such as dry meat, breads). SLP will follow briefly during acute stay for tolerance, training in compensations. SLP Visit Diagnosis: Dysphagia, oropharyngeal phase (R13.12)    Aspiration Risk  Mild aspiration risk    Diet Recommendation Thin liquid;Dysphagia 3 (Mech soft) (moisten solids, avoid tough meats, dry bread or troublesome foods)   Liquid Administration via: Cup Medication Administration: Whole meds with liquid (break larger pills, can take meds whole in puree if this is easier) Supervision: Patient able to self feed Compensations: Slow rate;Small sips/bites Postural Changes: Seated upright at 90 degrees    Other  Recommendations Recommended Consults: Consider GI evaluation (consider f/u with GI as OP) Oral Care Recommendations: Oral care BID   Follow up Recommendations None      Frequency and Duration min 1 x/week  1 week       Prognosis Prognosis for Safe Diet Advancement: Good      Swallow Study   General Date of Onset: 11/07/20 HPI: Pt is 87 y.o. female who presented with weakness, decreased PO intake, weight gain and admitted for management of dehydration,  over-diuresis. Type of Study: Bedside Swallow Evaluation Previous Swallow Assessment: none on file Diet Prior to this Study: Dysphagia 2 (chopped);Thin liquids Temperature Spikes Noted: No Respiratory Status: Room air History of Recent Intubation:  No Behavior/Cognition: Alert;Cooperative;Pleasant mood Oral Cavity Assessment: Within Functional Limits Oral Care Completed by SLP: No Oral Cavity - Dentition: Adequate natural dentition (has a partial) Vision: Functional for self-feeding Self-Feeding Abilities: Able to feed self Patient Positioning: Upright in bed Baseline Vocal Quality: Normal Volitional Cough: Strong Volitional Swallow: Able to elicit    Oral/Motor/Sensory Function Overall Oral Motor/Sensory Function: Within functional limits   Ice Chips Ice chips: Not tested   Thin Liquid Thin Liquid: Within functional limits Presentation: Cup;Straw;Self Fed    Nectar Thick Nectar Thick Liquid: Not tested   Honey Thick Honey Thick Liquid: Not tested   Puree Puree: Within functional limits Presentation: Self Fed;Spoon   Solid     Solid: Within functional limits Presentation: Self Fed     Rondel Baton, MS, CCC-SLP Speech-Language Pathologist  Arlana Lindau 11/08/2020,1:20 PM

## 2020-11-08 NOTE — Consult Note (Signed)
Hematology/Oncology Consult note [facility] Telephone:([phone] [phone]  Patient Care Team: Kandyce Rud, MD as PCP - General (Family Medicine) Delma Freeze, FNP as Nurse Practitioner (Family Medicine) Dalia Heading, MD as Consulting Physician (Cardiology)   Name of the patient: Colleen Carson  [id number]  1925/05/15   Date of visit: 11/08/20 REASON FOR COSULTATION:  Thrombocytopenia History of presenting illness-  87 y.o. female with PMH listed at below including atrial fibrillation on anticoagulation, lymphedema, stroke, CHF, who presents to ER for evaluation of fatigue and weakness.  Patient had recent UTI infection and was given oral antibiotics for treatment.  There was also concern of possible overdiuresis. In emergency room creatinine was found to be elevated at 1.78 and patient was admitted for AKI, dehydration. It was noted that patient has acute on chronic thrombocytopenia.  Hematology was consulted for further evaluation As outpatient at bedside.  She is undergoing speech evaluation for dysphagia problems.  No family members at the bedside. She lives with daughter at home.  11/08/2020, CBC showed a platelet count of 117,000.  No reported active bleeding events.  She does have easy bruising due to being on anticoagulation.  Patient has normal platelet count and to April 2019 when a slight downward trend of platelet count is observed.  Her baseline is about 1 30-1 40,000. Patient reports feeling better today since admission.  Review of Systems  Constitutional: Positive for fatigue. Negative for appetite change, chills and fever.  HENT:   Negative for hearing loss and voice change.        Dysphagia symptoms.  Eyes: Negative for eye problems.  Respiratory: Negative for chest tightness and cough.   Cardiovascular: Positive for leg swelling. Negative for chest pain.  Gastrointestinal: Negative for abdominal distention, abdominal pain  and blood in stool.  Endocrine: Negative for hot flashes.  Genitourinary: Negative for difficulty urinating and frequency.   Musculoskeletal: Negative for arthralgias.  Skin: Negative for itching and rash.  Neurological: Negative for extremity weakness.  Hematological: Negative for adenopathy. Bruises/bleeds easily.  Psychiatric/Behavioral: Negative for confusion.    Allergies  Allergen Reactions  . Clonazepam Other (See Comments)    Heavy sedation  . Morphine Nausea Only    Hallucinations, nausea, vomitting  . Morphine And Related Nausea And Vomiting    Patient Active Problem List   Diagnosis Date Noted  . Acute on chronic diastolic heart failure (HCC) 11/29/2019  . Chronic diastolic heart failure (HCC) 05/17/2018  . Chronic systolic heart failure (HCC) 03/04/2017  . Anxiety 03/04/2017  . TIA (transient ischemic attack) 12/24/2016  . Acute encephalopathy 10/01/2016  . Symptomatic bradycardia 05/15/2016  . HTN (hypertension) 05/15/2016  . HLD (hyperlipidemia) 05/15/2016  . Atrial fibrillation (HCC) 03/11/2016  . AKI (acute kidney injury) (HCC) 05/06/2015  . S/P total hip arthroplasty 01/25/2015  . Primary osteoarthritis of left hip 01/23/2015     Past Medical History:  Diagnosis Date  . Arthritis   . Atrial fibrillation (HCC)   . CHF (congestive heart failure) (HCC)   . Dysrhythmia    Afib  . Hypercholesteremia   . Hypertension   . Lymphedema    lower extremities  . S/P total hip arthroplasty 01/25/2015  . Shingles   . Stroke [facility])    noted ischemia on CT scan     Past Surgical History:  Procedure Laterality Date  . JOINT REPLACEMENT  01/23/15   Left THR Dr. Rosita Kea   . PACEMAKER INSERTION  2017  . TOTAL  HIP ARTHROPLASTY Left 01/23/2015   Procedure: TOTAL HIP ARTHROPLASTY ANTERIOR APPROACH;  Surgeon: Kennedy Bucker, MD;  Location: [facility] ORS;  Service: Orthopedics;  Laterality: Left;    Social History   Socioeconomic History  . Marital status: Widowed    Spouse  name: Not on file  . Number of children: Not on file  . Years of education: Not on file  . Highest education level: Not on file  Occupational History  . Occupation: retired  Tobacco Use  . Smoking status: Never Smoker  . Smokeless tobacco: Never Used  Vaping Use  . Vaping Use: Never used  Substance and Sexual Activity  . Alcohol use: No  . Drug use: No  . Sexual activity: Not Currently  Other Topics Concern  . Not on file  Social History Narrative  . Not on file   Social Determinants of Health   Financial Resource Strain: Not on file  Food Insecurity: Not on file  Transportation Needs: Not on file  Physical Activity: Not on file  Stress: Not on file  Social Connections: Not on file  Intimate Partner Violence: Not on file     Family History  Problem Relation Age of Onset  . Hypertension Other      Current Facility-Administered Medications:  .  acetaminophen (TYLENOL) tablet 650 mg, 650 mg, Oral, Q6H PRN **OR** acetaminophen (TYLENOL) suppository 650 mg, 650 mg, Rectal, Q6H PRN, Lynn Ito, MD .  apixaban (ELIQUIS) tablet 2.5 mg, 2.5 mg, Oral, BID, Marylu Lund, Sahar, MD, 2.5 mg at 11/08/20 1032 .  atorvastatin (LIPITOR) tablet 40 mg, 40 mg, Oral, QHS, Amery, Sahar, MD, 40 mg at 11/07/20 2320 .  cefTRIAXone (ROCEPHIN) 1 g in sodium chloride 0.9 % 100 mL IVPB, 1 g, Intravenous, Q24H, Lynn Ito, MD, Stopped at 11/07/20 1905 .  docusate sodium (COLACE) capsule 100 mg, 100 mg, Oral, BID, Marylu Lund, Sahar, MD, 100 mg at 11/08/20 1032 .  multivitamin with minerals tablet 1 tablet, 1 tablet, Oral, Daily, Lynn Ito, MD, 1 tablet at 11/08/20 1032 .  senna (SENOKOT) tablet 8.6 mg, 1 tablet, Oral, BID, Marylu Lund, Sahar, MD, 8.6 mg at 11/08/20 1032 .  sodium polystyrene (KAYEXALATE) 15 GM/60ML suspension 30 g, 30 g, Oral, Once, Lynn Ito, MD   Physical exam:  Vitals:   11/07/20 2034 11/07/20 2328 11/08/20 0500 11/08/20 0803  BP: 121/77 (!) 130/93  139/76  Pulse: 62 60  (!) 59   Resp: 16 16  15   Temp: (!) 97.5 F (36.4 C)   98.2 F (36.8 C)  TempSrc:      SpO2: 100% 98%  98%  Weight:  146 lb 8 oz (66.5 kg) 134 lb 7.7 oz (61 kg)   Height:       Physical Exam Constitutional:      General: She is not in acute distress.    Appearance: She is not diaphoretic.     Comments: Frail elderly female  HENT:     Head: Normocephalic and atraumatic.     Nose: Nose normal.     Mouth/Throat:     Mouth: Oropharynx is clear and moist.     Pharynx: No oropharyngeal exudate.  Eyes:     General: No scleral icterus.    Extraocular Movements: EOM normal.     Pupils: Pupils are equal, round, and reactive to light.  Cardiovascular:     Rate and Rhythm: Normal rate and regular rhythm.     Heart sounds: No murmur heard.   Pulmonary:  Effort: Pulmonary effort is normal. No respiratory distress.     Breath sounds: No rales.  Abdominal:     General: There is no distension.     Palpations: Abdomen is soft.     Tenderness: There is no abdominal tenderness.  Musculoskeletal:        General: Swelling present. No edema. Normal range of motion.     Cervical back: Normal range of motion and neck supple.  Skin:    General: Skin is warm and dry.     Findings: No erythema.  Neurological:     Mental Status: She is alert and oriented to person, place, and time.     Cranial Nerves: No cranial nerve deficit.     Motor: No abnormal muscle tone.     Coordination: Coordination normal.  Psychiatric:        Mood and Affect: Mood and affect normal.         CMP Latest Ref Rng & Units 11/08/2020  Glucose 70 - 99 mg/dL 82  BUN 8 - 23 mg/dL 41(O)  Creatinine 8.78 - 1.00 mg/dL 6.76(H)  Sodium 209 - 470 mmol/L 135  Potassium 3.5 - 5.1 mmol/L 5.3(H)  Chloride 98 - 111 mmol/L 101  CO2 22 - 32 mmol/L 21(L)  Calcium 8.9 - 10.3 mg/dL 9.6  Total Protein 6.5 - 8.1 g/dL -  Total Bilirubin 0.3 - 1.2 mg/dL -  Alkaline Phos 38 - 962 U/L -  AST 15 - 41 U/L -  ALT 0 - 44 U/L -   CBC  Latest Ref Rng & Units 11/08/2020  WBC 4.0 - 10.5 K/uL 4.9  Hemoglobin 12.0 - 15.0 g/dL 83.6  Hematocrit 62.9 - 46.0 % 38.7  Platelets 150 - 400 K/uL 117(L)    RADIOGRAPHIC STUDIES: I have personally reviewed the radiological images as listed and agreed with the findings in the report. DG Chest 1 View  Result Date: 11/07/2020 CLINICAL DATA:  Cough, lethargy EXAM: CHEST  1 VIEW COMPARISON:  04/01/2020 FINDINGS: Single frontal view of the chest demonstrates an enlarged cardiac silhouette. Stable atherosclerosis of the aorta. Diffuse interstitial scarring again noted unchanged. No focal airspace disease, effusion, or pneumothorax. IMPRESSION: 1. Enlarged cardiac silhouette, stable. 2. Chronic interstitial scarring, without acute airspace disease. Electronically Signed   By: Sharlet Salina M.D.   On: 11/07/2020 15:57    Assessment and plan-   Thrombocytopenia, Platelet count during this admission slightly lower than her baseline. Check folate, smear, immature platelet fraction, B12, hepatitis panel. -Smear showed no increase of schistocytes, no circulating blasts.  Mild anisocytosis.  Normal folate level.  Normal immature platelet fraction.  Hepatitis panel and B12 are pending.  Acute slightly drop of her platelet counts probably due to recent infection.  Pending above work-up.  I discussed with Dr. Marylu Lund.  Patient may be discharged from my aspect and follow-up with me outpatient in 2 weeks.  Our office will call patient to arrange her follow-up appointment.  Patient was also provided my office contact information.  AKI, on chronic CKD.  Patient has been on gentle hydration and has improved. Atrial fibrillation, on Eliquis.    Thank you for allowing me to participate in the care of this patient.   Rickard Patience, MD, PhD Hematology Oncology [facility] at [facility] Pager- [phone] 11/08/2020

## 2020-11-08 NOTE — Discharge Summary (Addendum)
Colleen Carson WUJ:[id number] DOB: 1925/05/08 DOA: 11/07/2020  PCP: Kandyce RudBabaoff, Marcus, MD  Admit date: 11/07/2020 Discharge date: 11/08/2020  Admitted From: home Disposition:  home  Recommendations for Outpatient Follow-up:  1. Follow up with PCP in 1 week 2. Please obtain BMP/CBC in one week 3. Follow up with Dr. Lady GaryFath in one week 4. F/u with Dr. Cathie HoopsYu hematology in 2 weeks 5. F/u with GI for dysphagia  Home Health:yes    Discharge Condition:Stable CODE STATUS:DNR  Diet recommendation: Heart Healthy, dysphagia 3  Brief/Interim Summary: Colleen DawleyHilda M Carson is a 87 y.o. female with medical history significant of atrial fibrillation on anticoagulation, lymphedema, stroke, CHF, pacemaker pain of weakness.  Patient was diagnosed with urinary tract infection a week ago per daughter and was given amoxicillin then switched to Augmentin yesterday. She had a recent CT scan abdomen and pelvis that revealed large stool burden no evidence of obstruction. She then saw her cardiologist Dr. Lady GaryFath for peripheral edema earlier in the week. She had torsemide increased but feels like she is not losing weight. Her weight was up 2 pounds per her scale at home. She then had reported weak and fatigued with dizziness while at cardiology office. Her vitals were stable. There was  concern over a possible overdiuresis. Her torsemide back to 20 mg daily and plan was recheck renal function. But today per daughter , pt was not feeling well, c/o weakness and not eating much. Was trying to hydrate before, but not much success. Pt herself reports dysuria and "discomfort". No cp. Stated had sob with anything , but does not appear to be acute.  Patient also c/o some swallowing issues. Swallowing evaluation was ordered.  1. Weakness- likely 2/2 dehydration, overdiuresis. Improved with gentle hydration with IV fluids at 50 MLS per hour Feels much better today PT OT recommend home health  2. AKI on ckd stage IIIb-prerenal due to over  diuresing and decreased p.o. intake Improved with gentle hydration down to 1.61 today Continue hydrating p.o.  3.Chronic Systolic HF Lymphedema Echo from 3/9-EF 45 to 50% Compensated and euvolemic on exam Recently her torsemide was decreased due to over diuresing Held in hospital. Pt was told to weigh daily and if water weight is up by 3lbs, to take her new low dose. Dr. Lady GaryFath was notified to see pt soon since being discharged. Beta blk held due to low bp.   4.UTI- recently dx. R/o here. UA negative. Was started on empiric abx, will dc as ua neative. F/u with pcp   5. Dysphagia-reported per daughter Speech evaluated the patient they considered can increase it dysphagia 3 diet which is mechanical soft however they felt this more in the esophageal area and recommended GI consultation as outpatient  6. H/xo afib- on Eliquis   7. Chronic thrombocytopenia hematology was consulted, will follow up with Dr. Cathie HoopsYu as outpatient  8.Hyperkalemia-mild . K 5.3. given kayexalate 30gm this am.  Needs ck lab by pcp.   Discharge Diagnoses:  Active Problems:   AKI (acute kidney injury) [facility](HCC)    Discharge Instructions  Discharge Instructions    Call MD for:  difficulty breathing, headache or visual disturbances   Complete by: As directed    Diet - low sodium heart healthy   Complete by: As directed    Discharge instructions   Complete by: As directed    Follow-up with a gastroenterologist for your swallowing issue Follow-up with PCP in 1 week Follow-up with Dr. Cathie HoopsYu hematology Follow-up with Dr. Lady GaryFath next  week Hold metoprolol , check blood pressure and call Dr. Lady Gary before talking it . Monitor weight daily, if water weight up by 3lbs take low dose lasix   Increase activity slowly   Complete by: As directed      Allergies as of 11/08/2020      Reactions   Clonazepam Other (See Comments)   Heavy sedation   Morphine Nausea Only   Hallucinations, nausea, vomitting   Morphine  And Related Nausea And Vomiting      Medication List    STOP taking these medications   amoxicillin-clavulanate 875-125 MG tablet Commonly known as: AUGMENTIN   cephALEXin 500 MG capsule Commonly known as: KEFLEX   metoprolol tartrate 50 MG tablet Commonly known as: LOPRESSOR   potassium chloride SA 20 MEQ tablet Commonly known as: KLOR-CON     TAKE these medications   apixaban 2.5 MG Tabs tablet Commonly known as: ELIQUIS Take 1 tablet (2.5 mg total) by mouth 2 (two) times daily. What changed:   medication strength  how much to take   atorvastatin 40 MG tablet Commonly known as: Lipitor Take 1 tablet (40 mg total) by mouth at bedtime.   CENTRUM SILVER 50+WOMEN PO Take 1 tablet by mouth daily.   cetirizine 10 MG tablet Commonly known as: ZYRTEC Take 10 mg by mouth daily.   Cranberry 1000 MG Caps Take by mouth daily.   torsemide 20 MG tablet Commonly known as: DEMADEX Take 2 tablets (40 mg total) by mouth daily. What changed: how much to take            Durable Medical Equipment  (From admission, onward)         Start     Ordered   11/08/20 1346  For home use only DME Shower stool  Once        11/08/20 1345          Follow-up Information    Kandyce Rud, MD Follow up in 1 week(s).   Specialty: Family Medicine Contact information: [address] [facility] [place] [address] [phone]        Dalia Heading, MD Follow up in 1 week(s).   Specialty: Cardiology Contact information: [address] [place] [address] [phone]        Rickard Patience, MD Follow up in 2 week(s).   Specialty: Oncology Contact information: [address] [place] [address] [phone]              Allergies  Allergen Reactions  . Clonazepam Other (See Comments)    Heavy sedation  . Morphine Nausea Only    Hallucinations, nausea, vomitting  . Morphine And Related Nausea And  Vomiting    Consultations:  hematology   Procedures/Studies: DG Chest 1 View  Result Date: 11/07/2020 CLINICAL DATA:  Cough, lethargy EXAM: CHEST  1 VIEW COMPARISON:  04/01/2020 FINDINGS: Single frontal view of the chest demonstrates an enlarged cardiac silhouette. Stable atherosclerosis of the aorta. Diffuse interstitial scarring again noted unchanged. No focal airspace disease, effusion, or pneumothorax. IMPRESSION: 1. Enlarged cardiac silhouette, stable. 2. Chronic interstitial scarring, without acute airspace disease. Electronically Signed   By: Sharlet Salina M.D.   On: 11/07/2020 15:57       Subjective: Feels much better today. Sitting in chair more upbeat today  Discharge Exam: Vitals:   11/08/20 0803 11/08/20 1145  BP: 139/76 118/73  Pulse: (!) 59 60  Resp: 15 16  Temp: 98.2 F (36.8 C) 98.2 F (36.8 C)  SpO2: 98% 95%   Vitals:   11/07/20 2328 11/08/20 0500 11/08/20 0803 11/08/20 1145  BP: (!) 130/93  139/76 118/73  Pulse: 60  (!) 59 60  Resp: 16  15 16   Temp:   98.2 F (36.8 C) 98.2 F (36.8 C)  TempSrc:      SpO2: 98%  98% 95%  Weight: 66.5 kg 61 kg    Height:        General: Pt is alert, awake, not in acute distress Cardiovascular: RRR, S1/S2 +, no rubs, no gallops Respiratory: CTA bilaterally, no wheezing, no rhonchi Abdominal: Soft, NT, ND, bowel sounds + Extremities: no edema    The results of significant diagnostics from this hospitalization (including imaging, microbiology, ancillary and laboratory) are listed below for reference.     Microbiology: Recent Results (from the past 240 hour(s))  Resp Panel by RT-PCR (Flu A&B, Covid) Nasopharyngeal Swab     Status: None   Collection Time: 11/07/20  3:42 PM   Specimen: Nasopharyngeal Swab; Nasopharyngeal(NP) swabs in vial transport medium  Result Value Ref Range Status   SARS Coronavirus 2 by RT PCR NEGATIVE NEGATIVE Final    Comment: (NOTE) SARS-CoV-2 target nucleic acids are NOT  DETECTED.  The SARS-CoV-2 RNA is generally detectable in upper respiratory specimens during the acute phase of infection. The lowest concentration of SARS-CoV-2 viral copies this assay can detect is 138 copies/mL. A negative result does not preclude SARS-Cov-2 infection and should not be used as the sole basis for treatment or other patient management decisions. A negative result may occur with  improper specimen collection/handling, submission of specimen other than nasopharyngeal swab, presence of viral mutation(s) within the areas targeted by this assay, and inadequate number of viral copies(<138 copies/mL). A negative result must be combined with clinical observations, patient history, and epidemiological information. The expected result is Negative.  Fact Sheet for Patients:  11/09/20  Fact Sheet for Healthcare Providers:  BloggerCourse.com  This test is no t yet approved or cleared by the [place] FDA and  has been authorized for detection and/or diagnosis of SARS-CoV-2 by FDA under an Emergency Use Authorization (EUA). This EUA will remain  in effect (meaning this test can be used) for the duration of the COVID-19 declaration under Section 564(b)(1) of the Act, 21 U.S.C.section 360bbb-3(b)(1), unless the authorization is terminated  or revoked sooner.       Influenza A by PCR NEGATIVE NEGATIVE Final   Influenza B by PCR NEGATIVE NEGATIVE Final    Comment: (NOTE) The Xpert Xpress SARS-CoV-2/FLU/RSV plus assay is intended as an aid in the diagnosis of influenza from Nasopharyngeal swab specimens and should not be used as a sole basis for treatment. Nasal washings and aspirates are unacceptable for Xpert Xpress SARS-CoV-2/FLU/RSV testing.  Fact Sheet for Patients: [place]  Fact Sheet for Healthcare Providers: BloggerCourse.com  This test is not yet  approved or cleared by the [place] FDA and has been authorized for detection and/or diagnosis of SARS-CoV-2 by FDA under an Emergency Use Authorization (EUA). This EUA will remain in effect (meaning this test can be used) for the duration of the COVID-19 declaration under Section 564(b)(1) of the Act, 21 U.S.C. section 360bbb-3(b)(1), unless the authorization is terminated or revoked.  Performed at [facility], [address] [place], [place]      Labs: BNP (last 3 results) Recent Labs    11/28/19 1848 11/02/20 1119 11/07/20  1542  BNP 461.0* 643.1* 555.4*   Basic Metabolic Panel: Recent Labs  Lab 11/07/20 1542 11/08/20 0547  NA 134* 135  K 5.0 5.3*  CL 100 101  CO2 25 21*  GLUCOSE 101* 82  BUN 46* 43*  CREATININE 1.78* 1.61*  CALCIUM 9.6 9.6  MG 2.4  --    Liver Function Tests: Recent Labs  Lab 11/07/20 1542  AST 47*  ALT 28  ALKPHOS 230*  BILITOT 1.3*  PROT 7.2  ALBUMIN 4.1   No results for input(s): LIPASE, AMYLASE in the last 168 hours. No results for input(s): AMMONIA in the last 168 hours. CBC: Recent Labs  Lab 11/07/20 1542 11/08/20 0547  WBC 4.4 4.9  NEUTROABS 2.5  --   HGB 13.1 13.0  HCT 40.9 38.7  MCV 90.7 87.8  PLT 121* 117*   Cardiac Enzymes: No results for input(s): CKTOTAL, CKMB, CKMBINDEX, TROPONINI in the last 168 hours. BNP: Invalid input(s): POCBNP CBG: No results for input(s): GLUCAP in the last 168 hours. D-Dimer No results for input(s): DDIMER in the last 72 hours. Hgb A1c No results for input(s): HGBA1C in the last 72 hours. Lipid Profile No results for input(s): CHOL, HDL, LDLCALC, TRIG, CHOLHDL, LDLDIRECT in the last 72 hours. Thyroid function studies No results for input(s): TSH, T4TOTAL, T3FREE, THYROIDAB in the last 72 hours.  Invalid input(s): FREET3 Anemia work up Recent Labs    11/08/20 0938  FOLATE 32.0   Urinalysis    Component Value Date/Time   COLORURINE YELLOW (A)  11/07/2020 1542   APPEARANCEUR HAZY (A) 11/07/2020 1542   APPEARANCEUR Hazy 01/10/2015 1349   LABSPEC 1.015 11/07/2020 1542   LABSPEC 1.026 01/10/2015 1349   PHURINE 6.0 11/07/2020 1542   GLUCOSEU NEGATIVE 11/07/2020 1542   GLUCOSEU Negative 01/10/2015 1349   HGBUR NEGATIVE 11/07/2020 1542   BILIRUBINUR NEGATIVE 11/07/2020 1542   BILIRUBINUR Negative 01/10/2015 1349   KETONESUR NEGATIVE 11/07/2020 1542   PROTEINUR 100 (A) 11/07/2020 1542   NITRITE NEGATIVE 11/07/2020 1542   LEUKOCYTESUR NEGATIVE 11/07/2020 1542   LEUKOCYTESUR 1+ 01/10/2015 1349   Sepsis Labs Invalid input(s): PROCALCITONIN,  WBC,  LACTICIDVEN Microbiology Recent Results (from the past 240 hour(s))  Resp Panel by RT-PCR (Flu A&B, Covid) Nasopharyngeal Swab     Status: None   Collection Time: 11/07/20  3:42 PM   Specimen: Nasopharyngeal Swab; Nasopharyngeal(NP) swabs in vial transport medium  Result Value Ref Range Status   SARS Coronavirus 2 by RT PCR NEGATIVE NEGATIVE Final    Comment: (NOTE) SARS-CoV-2 target nucleic acids are NOT DETECTED.  The SARS-CoV-2 RNA is generally detectable in upper respiratory specimens during the acute phase of infection. The lowest concentration of SARS-CoV-2 viral copies this assay can detect is 138 copies/mL. A negative result does not preclude SARS-Cov-2 infection and should not be used as the sole basis for treatment or other patient management decisions. A negative result may occur with  improper specimen collection/handling, submission of specimen other than nasopharyngeal swab, presence of viral mutation(s) within the areas targeted by this assay, and inadequate number of viral copies(<138 copies/mL). A negative result must be combined with clinical observations, patient history, and epidemiological information. The expected result is Negative.  Fact Sheet for Patients:  BloggerCourse.com  Fact Sheet for Healthcare Providers:   [employer]  This test is no t yet approved or cleared by the [place] FDA and  has been authorized for detection and/or diagnosis of SARS-CoV-2 by FDA under an Emergency Use Authorization (EUA).  This EUA will remain  in effect (meaning this test can be used) for the duration of the COVID-19 declaration under Section 564(b)(1) of the Act, 21 U.S.C.section 360bbb-3(b)(1), unless the authorization is terminated  or revoked sooner.       Influenza A by PCR NEGATIVE NEGATIVE Final   Influenza B by PCR NEGATIVE NEGATIVE Final    Comment: (NOTE) The Xpert Xpress SARS-CoV-2/FLU/RSV plus assay is intended as an aid in the diagnosis of influenza from Nasopharyngeal swab specimens and should not be used as a sole basis for treatment. Nasal washings and aspirates are unacceptable for Xpert Xpress SARS-CoV-2/FLU/RSV testing.  Fact Sheet for Patients: BloggerCourse.com  Fact Sheet for Healthcare Providers: [employer]ious[employer]ker.it  This test is not yet approved or cleared by the [place] FDA and has been authorized for detection and/or diagnosis of SARS-CoV-2 by FDA under an Emergency Use Authorization (EUA). This EUA will remain in effect (meaning this test can be used) for the duration of the COVID-19 declaration under Section 564(b)(1) of the Act, 21 U.S.C. section 360bbb-3(b)(1), unless the authorization is terminated or revoked.  Performed at [facility], [address], [place], [place] [address]      Time coordinating discharge: Over 30 minutes  SIGNED:   Lynn Ito, MD  [facility] 11/08/2020, 2:00 PM Pager   If 7PM-7AM, please contact night-coverage [email or web address] Password [email or web address]

## 2020-11-08 NOTE — Evaluation (Addendum)
Occupational Therapy Evaluation Patient Details Name: Colleen Carson MRN: [id number] DOB: 1925-09-07 Today's Date: 11/08/2020    History of Present Illness presented to ER secondary to progressive weakness, decreased PO and increased weight gait; admitted for management of dehydration, over-diuresis   Clinical Impression   Colleen Carson was seen for OT evaluation this date. Pt was independent in all ADL and functional mobility, living in a 2 story home with daughter and son in law available near 24/7. Pt is MOD I for LBD and ADL t/fs using RW, including ~160 ft mobility. Pt and daughter educated in energy conservation strategies including pursed lip breathing, activity pacing, home/routines modifications, work simplification, AE/DME, prioritizing of meaningful occupations, and falls prevention. Pt verbalized understanding and all questions answered. No further acute OT needs, will sign off. Upon discharge, recommend Outpatient OT services.       Follow Up Recommendations  Outpatient OT (lymphadema OT)    Equipment Recommendations  Tub/shower seat;Other (comment) (2WW)    Recommendations for Other Services       Precautions / Restrictions Precautions Precautions: Fall Restrictions Weight Bearing Restrictions: No      Mobility Bed Mobility Overal bed mobility: Independent                  Transfers Overall transfer level: Modified independent   Transfers: Sit to/from Stand Sit to Stand: Supervision              Balance Overall balance assessment: Needs assistance Sitting-balance support: No upper extremity supported;Feet supported Sitting balance-Leahy Scale: Good     Standing balance support: Bilateral upper extremity supported Standing balance-Leahy Scale: Good                             ADL either performed or assessed with clinical judgement   ADL Overall ADL's : Modified independent                                                           Pertinent Vitals/Pain Pain Assessment: No/denies pain     Hand Dominance     Extremity/Trunk Assessment Upper Extremity Assessment Upper Extremity Assessment: Overall WFL for tasks assessed   Lower Extremity Assessment Lower Extremity Assessment: Overall WFL for tasks assessed       Communication Communication Communication: No difficulties   Cognition Arousal/Alertness: Awake/alert Behavior During Therapy: WFL for tasks assessed/performed Overall Cognitive Status: Within Functional Limits for tasks assessed                                     General Comments  SpO2 99% on RA following ~160 ft mobility    Exercises Exercises: Other exercises Other Exercises Other Exercises: Pt and family educated er: OT role, DME recs, d/c recs, falls prevention, ECS, home/routines modifications Other Exercises: LBD, mobility, sit>sup, sit<>stand, sitting/standing balance/tolerance   Shoulder Instructions      Home Living Family/patient expects to be discharged to:: Private residence Living Arrangements: Children;Spouse/significant other Available Help at Discharge: Family;Available PRN/intermittently Type of Home: House Home Access: Stairs to enter Entrance Stairs-Number of Steps: 3   Home Layout: Two level;Able to live on main level with bedroom/bathroom  Home Equipment: Walker - 4 wheels;Shower seat - built in;Grab bars - tub/shower   Additional Comments: Daughter works part time and son in Social worker works from home      Prior Functioning/Environment Level of Independence: Independent        Comments: Indep with ADLs, household mobilization; does use 4wRW for longer, outdoor distances. Denies fall history.        OT Problem List: Decreased strength;Decreased activity tolerance         OT Goals(Current goals can be found in the care plan section) Acute Rehab OT Goals Patient Stated Goal: to return home OT Goal  Formulation: With patient/family Time For Goal Achievement: 11/22/20 Potential to Achieve Goals: Good   AM-PAC OT "6 Clicks" Daily Activity     Outcome Measure Help from another person eating meals?: None Help from another person taking care of personal grooming?: None Help from another person toileting, which includes using toliet, bedpan, or urinal?: A Little Help from another person bathing (including washing, rinsing, drying)?: A Little Help from another person to put on and taking off regular upper body clothing?: None Help from another person to put on and taking off regular lower body clothing?: None 6 Click Score: 22   End of Session Equipment Utilized During Treatment: Rolling walker Nurse Communication: Mobility status  Activity Tolerance: Patient tolerated treatment well Patient left: in bed;with call bell/phone within reach;with family/visitor present  OT Visit Diagnosis: Other abnormalities of gait and mobility (R26.89)                Time: 3976-7341 OT Time Calculation (min): 24 min Charges:  OT General Charges $OT Visit: 1 Visit OT Evaluation $OT Eval Moderate Complexity: 1 Mod OT Treatments $Self Care/Home Management : 8-22 mins  Kathie Dike, M.S. OTR/L  11/08/20, 1:21 PM  ascom [phone]

## 2020-11-09 LAB — URINE CULTURE: Culture: NO GROWTH

## 2020-11-10 ENCOUNTER — Emergency Department: Payer: Medicare Other

## 2020-11-10 ENCOUNTER — Other Ambulatory Visit: Payer: Self-pay

## 2020-11-10 ENCOUNTER — Inpatient Hospital Stay
Admission: EM | Admit: 2020-11-10 | Discharge: 2020-11-13 | DRG: 948 | Disposition: A | Discharge status: Home Health | Payer: Medicare Other | Attending: Internal Medicine | Admitting: Internal Medicine

## 2020-11-10 DIAGNOSIS — N179 Acute kidney failure, unspecified: Secondary | ICD-10-CM | POA: Diagnosis present

## 2020-11-10 DIAGNOSIS — Z87440 Personal history of urinary (tract) infections: Secondary | ICD-10-CM

## 2020-11-10 DIAGNOSIS — E875 Hyperkalemia: Secondary | ICD-10-CM | POA: Diagnosis present

## 2020-11-10 DIAGNOSIS — Z96642 Presence of left artificial hip joint: Secondary | ICD-10-CM | POA: Diagnosis present

## 2020-11-10 DIAGNOSIS — Z20822 Contact with and (suspected) exposure to covid-19: Secondary | ICD-10-CM | POA: Diagnosis present

## 2020-11-10 DIAGNOSIS — I4891 Unspecified atrial fibrillation: Secondary | ICD-10-CM | POA: Diagnosis present

## 2020-11-10 DIAGNOSIS — I5032 Chronic diastolic (congestive) heart failure: Secondary | ICD-10-CM

## 2020-11-10 DIAGNOSIS — E785 Hyperlipidemia, unspecified: Secondary | ICD-10-CM | POA: Diagnosis present

## 2020-11-10 DIAGNOSIS — R5381 Other malaise: Secondary | ICD-10-CM

## 2020-11-10 DIAGNOSIS — R52 Pain, unspecified: Secondary | ICD-10-CM

## 2020-11-10 DIAGNOSIS — Z7901 Long term (current) use of anticoagulants: Secondary | ICD-10-CM

## 2020-11-10 DIAGNOSIS — Z79899 Other long term (current) drug therapy: Secondary | ICD-10-CM

## 2020-11-10 DIAGNOSIS — R531 Weakness: Secondary | ICD-10-CM

## 2020-11-10 DIAGNOSIS — I10 Essential (primary) hypertension: Secondary | ICD-10-CM | POA: Diagnosis present

## 2020-11-10 DIAGNOSIS — Z8249 Family history of ischemic heart disease and other diseases of the circulatory system: Secondary | ICD-10-CM

## 2020-11-10 DIAGNOSIS — I480 Paroxysmal atrial fibrillation: Secondary | ICD-10-CM | POA: Diagnosis present

## 2020-11-10 DIAGNOSIS — R680 Hypothermia, not associated with low environmental temperature: Secondary | ICD-10-CM | POA: Diagnosis not present

## 2020-11-10 DIAGNOSIS — Z885 Allergy status to narcotic agent status: Secondary | ICD-10-CM

## 2020-11-10 DIAGNOSIS — N1832 Chronic kidney disease, stage 3b: Secondary | ICD-10-CM | POA: Diagnosis present

## 2020-11-10 DIAGNOSIS — Z8673 Personal history of transient ischemic attack (TIA), and cerebral infarction without residual deficits: Secondary | ICD-10-CM

## 2020-11-10 DIAGNOSIS — Z66 Do not resuscitate: Secondary | ICD-10-CM | POA: Diagnosis present

## 2020-11-10 DIAGNOSIS — I130 Hypertensive heart and chronic kidney disease with heart failure and stage 1 through stage 4 chronic kidney disease, or unspecified chronic kidney disease: Secondary | ICD-10-CM | POA: Diagnosis present

## 2020-11-10 DIAGNOSIS — R410 Disorientation, unspecified: Principal | ICD-10-CM | POA: Diagnosis present

## 2020-11-10 DIAGNOSIS — M1990 Unspecified osteoarthritis, unspecified site: Secondary | ICD-10-CM | POA: Diagnosis present

## 2020-11-10 DIAGNOSIS — R7989 Other specified abnormal findings of blood chemistry: Secondary | ICD-10-CM

## 2020-11-10 DIAGNOSIS — E041 Nontoxic single thyroid nodule: Secondary | ICD-10-CM | POA: Diagnosis present

## 2020-11-10 DIAGNOSIS — I5042 Chronic combined systolic (congestive) and diastolic (congestive) heart failure: Secondary | ICD-10-CM | POA: Diagnosis present

## 2020-11-10 DIAGNOSIS — Z950 Presence of cardiac pacemaker: Secondary | ICD-10-CM

## 2020-11-10 DIAGNOSIS — E860 Dehydration: Secondary | ICD-10-CM | POA: Diagnosis present

## 2020-11-10 DIAGNOSIS — I890 Lymphedema, not elsewhere classified: Secondary | ICD-10-CM | POA: Diagnosis present

## 2020-11-10 DIAGNOSIS — D696 Thrombocytopenia, unspecified: Secondary | ICD-10-CM | POA: Diagnosis present

## 2020-11-10 DIAGNOSIS — E876 Hypokalemia: Secondary | ICD-10-CM

## 2020-11-10 DIAGNOSIS — M7989 Other specified soft tissue disorders: Secondary | ICD-10-CM

## 2020-11-10 DIAGNOSIS — R1310 Dysphagia, unspecified: Secondary | ICD-10-CM

## 2020-11-10 DIAGNOSIS — F419 Anxiety disorder, unspecified: Secondary | ICD-10-CM | POA: Diagnosis present

## 2020-11-10 LAB — COMPREHENSIVE METABOLIC PANEL
ALT: 26 U/L (ref 0–44)
AST: 53 U/L — ABNORMAL HIGH (ref 15–41)
Albumin: 4.1 g/dL (ref 3.5–5.0)
Alkaline Phosphatase: 226 U/L — ABNORMAL HIGH (ref 38–126)
Anion gap: 16 — ABNORMAL HIGH (ref 5–15)
BUN: 35 mg/dL — ABNORMAL HIGH (ref 8–23)
CO2: 26 mmol/L (ref 22–32)
Calcium: 9.7 mg/dL (ref 8.9–10.3)
Chloride: 95 mmol/L — ABNORMAL LOW (ref 98–111)
Creatinine, Ser: 1.43 mg/dL — ABNORMAL HIGH (ref 0.44–1.00)
GFR, Estimated: 34 mL/min — ABNORMAL LOW (ref >60)
Glucose, Bld: 107 mg/dL — ABNORMAL HIGH (ref 70–99)
Potassium: 3.2 mmol/L — ABNORMAL LOW (ref 3.5–5.1)
Sodium: 137 mmol/L (ref 135–145)
Total Bilirubin: 1.4 mg/dL — ABNORMAL HIGH (ref 0.3–1.2)
Total Protein: 7.4 g/dL (ref 6.5–8.1)

## 2020-11-10 LAB — PROCALCITONIN: Procalcitonin: <0.1 ng/mL

## 2020-11-10 LAB — CBC WITH DIFFERENTIAL/PLATELET
Abs Immature Granulocytes: 0.01 10*3/uL (ref 0.00–0.07)
Basophils Absolute: 0 10*3/uL (ref 0.0–0.1)
Basophils Relative: 0 %
Eosinophils Absolute: 0.1 10*3/uL (ref 0.0–0.5)
Eosinophils Relative: 1 %
HCT: 41.1 % (ref 36.0–46.0)
Hemoglobin: 13.5 g/dL (ref 12.0–15.0)
Immature Granulocytes: 0 %
Lymphocytes Relative: 31 %
Lymphs Abs: 2.1 10*3/uL (ref 0.7–4.0)
MCH: 29 pg (ref 26.0–34.0)
MCHC: 32.8 g/dL (ref 30.0–36.0)
MCV: 88.4 fL (ref 80.0–100.0)
Monocytes Absolute: 0.8 10*3/uL (ref 0.1–1.0)
Monocytes Relative: 11 %
Neutro Abs: 3.9 10*3/uL (ref 1.7–7.7)
Neutrophils Relative %: 57 %
Platelets: 115 10*3/uL — ABNORMAL LOW (ref 150–400)
RBC: 4.65 MIL/uL (ref 3.87–5.11)
RDW: 15.9 % — ABNORMAL HIGH (ref 11.5–15.5)
WBC: 6.8 10*3/uL (ref 4.0–10.5)
nRBC: 0 % (ref 0.0–0.2)

## 2020-11-10 LAB — URINALYSIS, COMPLETE (UACMP) WITH MICROSCOPIC
Bacteria, UA: NONE SEEN
Bilirubin Urine: NEGATIVE
Glucose, UA: NEGATIVE mg/dL
Hgb urine dipstick: NEGATIVE
Ketones, ur: NEGATIVE mg/dL
Leukocytes,Ua: NEGATIVE
Nitrite: NEGATIVE
Protein, ur: NEGATIVE mg/dL
Specific Gravity, Urine: 1.01 (ref 1.005–1.030)
WBC, UA: NONE SEEN WBC/hpf (ref 0–5)
pH: 5 (ref 5.0–8.0)

## 2020-11-10 LAB — RESP PANEL BY RT-PCR (FLU A&B, COVID) ARPGX2
Influenza A by PCR: NEGATIVE
Influenza B by PCR: NEGATIVE
SARS Coronavirus 2 by RT PCR: NEGATIVE

## 2020-11-10 LAB — TSH: TSH: 7.988 u[IU]/mL — ABNORMAL HIGH (ref 0.350–4.500)

## 2020-11-10 LAB — TROPONIN I (HIGH SENSITIVITY)
Troponin I (High Sensitivity): 25 ng/L — ABNORMAL HIGH (ref <18)
Troponin I (High Sensitivity): 33 ng/L — ABNORMAL HIGH (ref <18)

## 2020-11-10 LAB — CK: Total CK: 79 U/L (ref 38–234)

## 2020-11-10 LAB — T4, FREE: Free T4: 1.21 ng/dL — ABNORMAL HIGH (ref 0.61–1.12)

## 2020-11-10 MED ORDER — ACETAMINOPHEN 500 MG PO TABS
1000.0000 mg | ORAL_TABLET | Freq: Once | ORAL | Status: AC
  Administered 2020-11-10: 1000 mg via ORAL
  Filled 2020-11-10: qty 2

## 2020-11-10 MED ORDER — SODIUM CHLORIDE 0.9% FLUSH: 3.0000 mL | INTRAVENOUS | Status: DC | PRN

## 2020-11-10 MED ORDER — ATORVASTATIN CALCIUM 20 MG PO TABS
40.0000 mg | ORAL_TABLET | Freq: Every day | ORAL | Status: DC
  Administered 2020-11-11 – 2020-11-12 (×2): 40 mg via ORAL
  Filled 2020-11-10 (×2): qty 2

## 2020-11-10 MED ORDER — ACETAMINOPHEN 650 MG RE SUPP: 650.0000 mg | Freq: Four times a day (QID) | RECTAL | Status: DC | PRN

## 2020-11-10 MED ORDER — TORSEMIDE 20 MG PO TABS
20.0000 mg | ORAL_TABLET | Freq: Every day | ORAL | Status: DC
Start: 2020-11-10 — End: 2020-11-13
  Administered 2020-11-11 – 2020-11-13 (×3): 20 mg via ORAL
  Filled 2020-11-10 (×4): qty 1

## 2020-11-10 MED ORDER — POTASSIUM CHLORIDE CRYS ER 20 MEQ PO TBCR
40.0000 meq | EXTENDED_RELEASE_TABLET | Freq: Once | ORAL | Status: AC
  Administered 2020-11-10: 40 meq via ORAL
  Filled 2020-11-10: qty 2

## 2020-11-10 MED ORDER — ONDANSETRON HCL 4 MG PO TABS: 4.0000 mg | ORAL_TABLET | Freq: Four times a day (QID) | ORAL | Status: DC | PRN

## 2020-11-10 MED ORDER — ACETAMINOPHEN 325 MG PO TABS
650.0000 mg | ORAL_TABLET | Freq: Four times a day (QID) | ORAL | Status: DC | PRN
  Administered 2020-11-11 – 2020-11-12 (×2): 650 mg via ORAL
  Filled 2020-11-10 (×2): qty 2

## 2020-11-10 MED ORDER — ADULT MULTIVITAMIN W/MINERALS CH
1.0000 | ORAL_TABLET | Freq: Every day | ORAL | Status: DC
  Administered 2020-11-11 – 2020-11-13 (×3): 1 via ORAL
  Filled 2020-11-10 (×4): qty 1

## 2020-11-10 MED ORDER — SODIUM CHLORIDE 0.9% FLUSH
3.0000 mL | Freq: Two times a day (BID) | INTRAVENOUS | Status: DC
  Administered 2020-11-10 – 2020-11-13 (×5): 3 mL via INTRAVENOUS

## 2020-11-10 MED ORDER — LORATADINE 10 MG PO TABS
10.0000 mg | ORAL_TABLET | Freq: Every day | ORAL | Status: DC
  Administered 2020-11-11 – 2020-11-13 (×3): 10 mg via ORAL
  Filled 2020-11-10 (×4): qty 1

## 2020-11-10 MED ORDER — SODIUM CHLORIDE 0.9 % IV SOLN: 250.0000 mL | INTRAVENOUS | Status: DC | PRN

## 2020-11-10 MED ORDER — APIXABAN 2.5 MG PO TABS
2.5000 mg | ORAL_TABLET | Freq: Two times a day (BID) | ORAL | Status: DC
  Administered 2020-11-11 – 2020-11-13 (×5): 2.5 mg via ORAL
  Filled 2020-11-10 (×7): qty 1

## 2020-11-10 MED ORDER — PHENOL 1.4 % MT LIQD
1.0000 | Freq: Four times a day (QID) | OROMUCOSAL | Status: DC | PRN
  Administered 2020-11-10: 1 via OROMUCOSAL
  Filled 2020-11-10: qty 177

## 2020-11-10 MED ORDER — ONDANSETRON HCL 4 MG/2ML IJ SOLN: 4.0000 mg | Freq: Four times a day (QID) | INTRAMUSCULAR | Status: DC | PRN

## 2020-11-10 MED ORDER — SODIUM CHLORIDE 0.9 % IV SOLN: INTRAVENOUS | Status: DC

## 2020-11-10 NOTE — ED Notes (Signed)
This RN to bedside as daughter was distraught saying her mother was choking- daughter gave pt some water and she began to choke- suction on and helped to get phlegm out of pt's mouth

## 2020-11-10 NOTE — H&P (Signed)
History and Physical    Colleen Carson ASN:[id number] DOB: 11-Jul-1925 DOA: 11/10/2020  PCP: Kandyce Rud, MD   Patient coming from: Home  I have personally briefly reviewed patient's old medical records in [facility]  Chief Complaint: Confusion                                Dysphagia  Most of the history was obtained from patient's daughter at the bedside  HPI: Colleen Carson is a 87 y.o. female with medical history significant for atrial fibrillation, hypertension, CHF, history of CVA and dyslipidemia who presents to the emergency room via EMS at the request of her daughter for evaluation of mental status changes which her daughter describes as talking about her husband who has been there for about 3 years and just not able to follow commands or carry out her activities of daily living like she usually would.  Patient was discharged from [facility], 2 days prior to this admission and at that time was in her usual state of health which daughter describes as awake, alert and able to follow commands.  She was ambulatory with a rolling walker.  Prior to that hospitalization she states that her mother was able to carry out all activities of daily living, able to care for herself, ambulate without an assist device and do some light cleaning in the house. Over the last 2 weeks she has had 2 emergency room visits, one hospitalization for progressive weakness, difficulty swallowing and presumed urinary tract infection for which she has been treated with multiple antibiotics. Her daughter states that since her discharge home she has been very weak, unable to get out of bed and has had poor oral intake due to pain when she swallows.  She is awake and alert and oriented only to person which daughter states is not her baseline. I am unable to do a review of systems on this patient . Labs show sodium 137, potassium 3.2, chloride 95, bicarb 26, glucose 107, BUN 35, creatinine 1.43, calcium 9.7, alkaline  phosphatase 226, albumin 4.1, AST 53, ALT 26, total protein 7.4, total bilirubin 1.4, CK 79, troponin 33, white count 6.8, hemoglobin 13.5, hematocrit 41, MCV 88.4, RDW 15.9, platelet count 115, TSH 7.9 Respiratory viral panel is negative Urine analysis is sterile CT scan of abdomen and pelvis shows question trace free fluid within the pelvis of uncertain etiology. Cardiomegaly, trace bilateral pleural effusions and mild bibasilar atelectasis. Colonic diverticulosis without definite evidence of acute diverticulitis. Aortic Atherosclerosis  CT scan of the head without contrast shows no acute or reversible finding. Chest x-ray reviewed by me shows similar appearance of the chest x-ray with likely scarring at the left lung base and no evidence of acute cardiopulmonary disease. Unchanged left chest wall pacing device and cardiomegaly.    ED Course: Patient is a 87 year old female who presents to the ER via EMS for evaluation of pain with swallowing as well as generalized weakness and confusion.  Patient was recently discharged from the hospital and according to the daughter has declined since her discharge and is very concerned about her mental status changes.  She is also concerned that patient is very weak and unable to ambulate and so patient will be referred to observation status.  Review of Systems: As per HPI otherwise all other systems reviewed and negative.    Past Medical History:  Diagnosis Date  . Arthritis   .  Atrial fibrillation (HCC)   . CHF (congestive heart failure) (HCC)   . Dysrhythmia    Afib  . Hypercholesteremia   . Hypertension   . Lymphedema    lower extremities  . S/P total hip arthroplasty 01/25/2015  . Shingles   . Stroke [facility])    noted ischemia on CT scan    Past Surgical History:  Procedure Laterality Date  . JOINT REPLACEMENT  01/23/15   Left THR Dr. Rosita Kea   . PACEMAKER INSERTION  2017  . TOTAL HIP ARTHROPLASTY Left 01/23/2015   Procedure: TOTAL HIP  ARTHROPLASTY ANTERIOR APPROACH;  Surgeon: Kennedy Bucker, MD;  Location: [facility] ORS;  Service: Orthopedics;  Laterality: Left;     reports that she has never smoked. She has never used smokeless tobacco. She reports that she does not drink alcohol and does not use drugs.  Allergies  Allergen Reactions  . Clonazepam Other (See Comments)    Heavy sedation  . Morphine Nausea Only and Other (See Comments)    Hallucinations, nausea, vomitting  . Morphine And Related Nausea And Vomiting    Family History  Problem Relation Age of Onset  . Hypertension Other       Prior to Admission medications   Medication Sig Start Date End Date Taking? Authorizing Provider  apixaban (ELIQUIS) 2.5 MG TABS tablet Take 1 tablet (2.5 mg total) by mouth 2 (two) times daily. 11/08/20 12/08/20 Yes Lynn Ito, MD  atorvastatin (LIPITOR) 40 MG tablet Take 1 tablet (40 mg total) by mouth at bedtime. 01/16/18  Yes Mody, Patricia Pesa, MD  cetirizine (ZYRTEC) 10 MG tablet Take 10 mg by mouth daily.   Yes [provider]  Cranberry 1000 MG CAPS Take by mouth daily.   Yes [provider]  Multiple Vitamins-Minerals (CENTRUM SILVER 50+WOMEN PO) Take 1 tablet by mouth daily.   Yes [provider]  torsemide (DEMADEX) 20 MG tablet Take 2 tablets (40 mg total) by mouth daily. Patient taking differently: Take 20 mg by mouth daily. 12/07/19 04/18/20 Yes Delma Freeze, FNP    Physical Exam: Vitals:   11/10/20 0900 11/10/20 1018 11/10/20 1100 11/10/20 1130  BP: (!) 148/85 (!) 151/95 134/73 128/73  Pulse: 79 80 60 80  Resp: Temp:      TempSrc:      SpO2: 100% 98% 99% 97%     Vitals:   11/10/20 0900 11/10/20 1018 11/10/20 1100 11/10/20 1130  BP: (!) 148/85 (!) 151/95 134/73 128/73  Pulse: 79 80 60 80  Resp: Temp:      TempSrc:      SpO2: 100% 98% 99% 97%      Constitutional: Alert and oriented x only to person. Not in any apparent distress  HEENT:      Head:  Normocephalic and atraumatic.         Eyes: PERLA, EOMI, Conjunctivae are normal. Sclera is non-icteric.       Mouth/Throat: Mucous membranes are moist.       Neck: Supple with no signs of meningismus. Cardiovascular: Regular rate and rhythm. No murmurs, gallops, or rubs. 2+ symmetrical distal pulses are present . No JVD. No LE edema.   Respiratory: Respiratory effort normal .Lungs sounds clear bilaterally. No wheezes, crackles, or rhonchi.  Gastrointestinal: Soft, non tender, and non distended with positive bowel sounds.  Genitourinary: No CVA tenderness. Musculoskeletal: Nontender with normal range of motion in all extremities. No cyanosis, or erythema of extremities.  Chronic lymphedema Neurologic:  Face is symmetric. Moving all extremities. No gross focal neurologic deficits . Skin: Skin is warm, dry.  No rash or ulcers Psychiatric: Mood and affect are normal*   Labs on Admission: I have personally reviewed following labs and imaging studies  CBC: Recent Labs  Lab 11/07/20 1542 11/08/20 0547 11/10/20 0909  WBC 4.4 4.9 6.8  NEUTROABS 2.5  --  3.9  HGB 13.1 13.0 13.5  HCT 40.9 38.7 41.1  MCV 90.7 87.8 88.4  PLT 121* 117* 115*   Basic Metabolic Panel: Recent Labs  Lab 11/07/20 1542 11/08/20 0547 11/08/20 1339 11/10/20 0909  NA 134* 135  --  137  K 5.0 5.3* 4.5 3.2*  CL 100 101  --  95*  CO2 25 21*  --  26  GLUCOSE 101* 82  --  107*  BUN 46* 43*  --  35*  CREATININE 1.78* 1.61*  --  1.43*  CALCIUM 9.6 9.6  --  9.7  MG 2.4  --   --   --    GFR: Estimated Creatinine Clearance: 20.6 mL/min (A) (by C-G formula based on SCr of 1.43 mg/dL (H)). Liver Function Tests: Recent Labs  Lab 11/07/20 1542 11/10/20 0909  AST 47* 53*  ALT 28 26  ALKPHOS 230* 226*  BILITOT 1.3* 1.4*  PROT 7.2 7.4  ALBUMIN 4.1 4.1   No results for input(s): LIPASE, AMYLASE in the last 168 hours. No results for input(s): AMMONIA in the last 168 hours. Coagulation Profile: No results for  input(s): INR, PROTIME in the last 168 hours. Cardiac Enzymes: Recent Labs  Lab 11/10/20 0909  CKTOTAL 79   BNP (last 3 results) No results for input(s): PROBNP in the last 8760 hours. HbA1C: No results for input(s): HGBA1C in the last 72 hours. CBG: No results for input(s): GLUCAP in the last 168 hours. Lipid Profile: No results for input(s): CHOL, HDL, LDLCALC, TRIG, CHOLHDL, LDLDIRECT in the last 72 hours. Thyroid Function Tests: Recent Labs    11/10/20 0909  TSH 7.988*  FREET4 1.21*   Anemia Panel: Recent Labs    11/08/20 0938  VITAMINB12 1,452*  FOLATE 32.0   Urine analysis:    Component Value Date/Time   COLORURINE YELLOW (A) 11/10/2020 0909   APPEARANCEUR CLEAR (A) 11/10/2020 0909   APPEARANCEUR Hazy 01/10/2015 1349   LABSPEC 1.010 11/10/2020 0909   LABSPEC 1.026 01/10/2015 1349   PHURINE 5.0 11/10/2020 0909   GLUCOSEU NEGATIVE 11/10/2020 0909   GLUCOSEU Negative 01/10/2015 1349   HGBUR NEGATIVE 11/10/2020 0909   BILIRUBINUR NEGATIVE 11/10/2020 0909   BILIRUBINUR Negative 01/10/2015 1349   KETONESUR NEGATIVE 11/10/2020 0909   PROTEINUR NEGATIVE 11/10/2020 0909   NITRITE NEGATIVE 11/10/2020 0909   LEUKOCYTESUR NEGATIVE 11/10/2020 0909   LEUKOCYTESUR 1+ 01/10/2015 1349    Radiological Exams on Admission: CT ABDOMEN PELVIS WO CONTRAST  Result Date: 11/10/2020 CLINICAL DATA:  87 year old female with abdominal pain, dysuria and altered mental status. EXAM: CT ABDOMEN AND PELVIS WITHOUT CONTRAST TECHNIQUE: Multidetector CT imaging of the abdomen and pelvis was performed following the standard protocol without IV contrast. COMPARISON:  08/09/2019 chest CT. FINDINGS: Please note that parenchymal abnormalities may be missed without intravenous contrast. Lower chest: Cardiomegaly, trace bilateral pleural effusion and pacemaker leads are present. Mild bibasilar atelectasis is noted. Hepatobiliary: No definite hepatic abnormality noted. No gallbladder abnormalities are  identified. No biliary dilatation. Pancreas: Unremarkable Spleen: Unremarkable Adrenals/Urinary Tract: Renal cortical thinning noted. Multiple lesions within both kidneys probably represent cysts.  There is no evidence of hydronephrosis or obstructing urinary calculi. Renal vascular calcifications are present. The adrenal glands are unremarkable. The bladder is not well distended. Stomach/Bowel: There is no evidence of bowel obstruction or pneumoperitoneum. Colonic diverticulosis noted without definite evidence of acute diverticulitis. No suspicious bowel wall thickening is noted. Vascular/Lymphatic: Aortic atherosclerosis. No enlarged abdominal or pelvic lymph nodes. Reproductive: No significant abnormality identified but LEFT hip replacement artifact obscures detail within the pelvis. Other: There may be a trace amount of free fluid within the posterior LOWER pelvis. Musculoskeletal: LEFT hip arthroplasty changes noted. Degenerative changes within the lumbar spine are present. IMPRESSION: 1. Question trace free fluid within the pelvis of uncertain etiology. 2. Cardiomegaly, trace bilateral pleural effusions and mild bibasilar atelectasis. 3. Colonic diverticulosis without definite evidence of acute diverticulitis. 4. Aortic Atherosclerosis (ICD10-I70.0). Electronically Signed   By: Harmon Pier M.D.   On: 11/10/2020 09:59   DG Chest 1 View  Result Date: 11/10/2020 CLINICAL DATA:  87 year old female with altered mental status EXAM: CHEST  1 VIEW COMPARISON:  11/07/2020 FINDINGS: Cardiomediastinal silhouette unchanged. Pacing device on the left chest wall with 2 leads in place. Calcifications of the aortic arch. No pneumothorax. Similar appearance at the left lung base with blunting at the left costophrenic angle. Coarsened interstitial markings throughout with no interlobular septal thickening. No confluent airspace disease. IMPRESSION: Similar appearance of the chest x-ray with likely scarring at the left lung  base and no evidence of acute cardiopulmonary disease. Unchanged left chest wall pacing device and cardiomegaly. Electronically Signed   By: Gilmer Mor D.O.   On: 11/10/2020 09:55   CT Head Wo Contrast  Result Date: 11/10/2020 CLINICAL DATA:  Mental status change with unknown cause EXAM: CT HEAD WITHOUT CONTRAST TECHNIQUE: Contiguous axial images were obtained from the base of the skull through the vertex without intravenous contrast. COMPARISON:  06/21/2019 FINDINGS: Brain: No evidence of acute infarction, hemorrhage, hydrocephalus, extra-axial collection or mass lesion/mass effect. Remote bilateral occipital infarct at the poles. Vascular: Atheromatous calcification. Skull: Normal. Negative for fracture or focal lesion. Sinuses/Orbits: No acute finding. IMPRESSION: No acute or reversible finding.  Stable from 2020. Electronically Signed   By: Marnee Spring M.D.   On: 11/10/2020 09:42     Assessment/Plan Principal Problem:   Generalized weakness Active Problems:   Atrial fibrillation (HCC)   HTN (hypertension)   Chronic diastolic heart failure (HCC)   Thrombocytopenia (HCC)    Generalized weakness Most likely physical deconditioning from recent hospital stay We will request PT evaluation Place patient on fall precautions   Altered mental status ??  Delirium Patient is oriented only to person not to place and time which her daughter states is not her baseline Unable to explain patient's mental status changes at this time as there is no obvious source of infection or medication change Will monitor patient closely Initial CT scan of the head without contrast does not show any acute findings, patient may need repeat imaging in a.m. if no improvement in her mental status   Hypokalemia Most likely secondary to diuretic use Supplement potassium   History of A. Fib Patient is rate controlled Continue apixaban as primary prophylaxis for an acute stroke   Chronic diastolic  dysfunction CHF Continue torsemide   Dysphagia Patient was seen and evaluated by speech therapy during her last hospitalization and they recommended mechanical soft diet with thin liquids and to consider GI evaluation We will request GI consult during this hospitalization   DVT prophylaxis: Apixaban  Code Status: DO NOT RESUSCITATE Family Communication: Greater than 50% of time was spent discussing patient's condition and plan of care with her daughter at the bedside.  All questions and concerns have been addressed.  She verbalizes understanding and agrees with the plan.  CODE STATUS was discussed and she is a DNR. Disposition Plan: Probable skilled nursing facility Consults called: Gastroenterology/physical therapy Status: Observation    Mayu Ronk MD [facility]     11/10/2020, 1:43 PM

## 2020-11-10 NOTE — ED Notes (Signed)
Notified provider Agbata that pt tearful, anxious, and confused. Pt grimacing but won't tell this RN why. Pt given sip of water before meds but barely able to drink it in her current state. Brought apple sauce to bedside to help pt take meds but holding them for now as concerned pt will aspirate. Will try again when pt calms down for a sustained timeframe.

## 2020-11-10 NOTE — ED Notes (Signed)
Attending Agbata at bedside.

## 2020-11-10 NOTE — TOC Progression Note (Signed)
Transition of Care [facility]) - Progression Note    Patient Details  Name: Colleen Carson MRN: [id number] Date of Birth: 05/07/1925  [facility] CM/SW Contact  Larwance Rote, LCSW Phone Number: 11/10/2020, 4:36 PM  Clinical Narrative:   Patient is established with [facility] for PT services.   TOC team following.   Expected Discharge Plan and Services          Social Determinants of Health (SDOH) Interventions    Readmission Risk Interventions No flowsheet data found.

## 2020-11-10 NOTE — ED Notes (Signed)
Daughter at bedside.

## 2020-11-10 NOTE — ED Notes (Signed)
Pt at CT

## 2020-11-10 NOTE — ED Notes (Signed)
New urine sample sent to lab. Provider Agbata notified.

## 2020-11-10 NOTE — Consult Note (Signed)
NEUROLOGY CONSULTATION NOTE   Date of service: November 10, 2020 Patient Name: Colleen DawleyHilda M Epstein MRN:  [id number] DOB:  [birth year] Reason for consult: "Confusion and dysphagia" _ _ _   _ __   _ __ _ _  __ __   _ __   __ _  History of Present Illness  Colleen DawleyHilda M Tafoya is a 87 y.o. female with PMH significant for arthritis, Afibb on Eliquis, CHF, HLD, HTN, shingles, hx of strokes who present with AMS.  History mostly provided by patient's son in law. She was doing absolutely fine about 2 week ago. She is a high functioning individual even with her advanced age. Was then noted to have some fatigue and dizziness along with weight gain. [facility] and noted to have increased stool burden and diagnosed with a UTI. She was started on Keflex and sent home from ED. Was seen by her PCP and switched to Amoxcillin. She was seen by cardiologist office the next day and her diuretics were increased for concern for weight gain. Reported weakness and fatigue. She was brought in to the ED on 2/16 for concern for dehydration. Had AKI on top of CKD3b. Was taken off Abx due to negative UA. She improved with gentle hydration and was discharged from hospital the next day. She was tired and lethargic at home, not getting out of bed. She went to bed on 11/09/20, was a little lethargic and woke up the next day and was more confused and somnolent. She was brought in to the hospital.  No fever, reports back of mouth is hurting on the right and it hurts when she swallows. Denies any dysuria.Has waxing and waning mentation on my evaluation. Poor attention and distractible. Other times more coherent and participates in history. Crying at times during exam and had an episode of BL leg shaking that was arhythmic with side to side head movement and moaning during that. Was able to squeeze her son in laws hand during the event. The event self resolved with in a minute with no post ictal period afterwards.  Patient tells me that she is  tired has not slept at all last night. Could not sleep due to pain in the back of her throat. Has not slept well in the last few days. Workup here with infectious studies with negative serum procalcitonin, no leukocytosis on CBC, has known thrombocytopenia on labs. CTH without contrast with no acute abnormalities and stable compared to prior from 2020. CT A/P with ?trace free fluid within the pelvis, cardiomegaly, colonic diverticulosis without definite evidence of acute diverticulitis, aortic atherosclerosis.   ROS   Unable to obtain a detailed ROS due to encephalopathy and concern for ureliable historian. Endorses pain in the back of her throat. BL leg edema.  Past History   Past Medical History:  Diagnosis Date  . Arthritis   . Atrial fibrillation (HCC)   . CHF (congestive heart failure) (HCC)   . Dysrhythmia    Afib  . Hypercholesteremia   . Hypertension   . Lymphedema    lower extremities  . S/P total hip arthroplasty 01/25/2015  . Shingles   . Stroke [facility](HCC)    noted ischemia on CT scan   Past Surgical History:  Procedure Laterality Date  . JOINT REPLACEMENT  01/23/15   Left THR Dr. Rosita KeaMenz   . PACEMAKER INSERTION  2017  . TOTAL HIP ARTHROPLASTY Left 01/23/2015   Procedure: TOTAL HIP ARTHROPLASTY ANTERIOR APPROACH;  Surgeon: Kennedy BuckerMichael Menz,  MD;  Location: [facility] ORS;  Service: Orthopedics;  Laterality: Left;   Family History  Problem Relation Age of Onset  . Hypertension Other    Social History   Socioeconomic History  . Marital status: Widowed    Spouse name: Not on file  . Number of children: Not on file  . Years of education: Not on file  . Highest education level: Not on file  Occupational History  . Occupation: retired  Tobacco Use  . Smoking status: Never Smoker  . Smokeless tobacco: Never Used  Vaping Use  . Vaping Use: Never used  Substance and Sexual Activity  . Alcohol use: No  . Drug use: No  . Sexual activity: Not Currently  Other Topics Concern  . Not on  file  Social History Narrative  . Not on file   Social Determinants of Health   Financial Resource Strain: Not on file  Food Insecurity: Not on file  Transportation Needs: Not on file  Physical Activity: Not on file  Stress: Not on file  Social Connections: Not on file   Allergies  Allergen Reactions  . Clonazepam Other (See Comments)    Heavy sedation  . Morphine Nausea Only and Other (See Comments)    Hallucinations, nausea, vomitting  . Morphine And Related Nausea And Vomiting    Medications  (Not in a hospital admission)    Vitals   Vitals:   11/10/20 1130 11/10/20 1400 11/10/20 1516 11/10/20 1630  BP: 128/73 130/69 (!) 144/75 132/66  Pulse: 80 (!) 58 80 70  Resp: 13 14 15 18   Temp:    97.6 F (36.4 C)  TempSrc:    Axillary  SpO2: 97% 96%  97%     There is no height or weight on file to calculate BMI.  Physical Exam   General: Laying comfortably in bed; in no acute distress. HENT: Normal oropharynx and mucosa. Normal external appearance of ears and nose. Neck: Supple, no pain or tenderness CV: No JVD. No peripheral edema. Pulmonary: Symmetric Chest rise. Normal respiratory effort. Abdomen: Soft to touch, non-tender. Ext: No cyanosis, edema, or deformity Skin: No rash. Normal palpation of skin. Musculoskeletal: Normal digits and nails by inspection. No clubbing.  Neurologic Examination  Mental status/Cognition: opens eyes to voice, unreliable historian with some waxing and waning mentation. More participatory with history when engaged, oriented to self, place, month and year, poor attention. Speech/language: Fluent, comprehension intact, object naming intact. Cranial nerves:   CN II Pupils equal and reactive to light, blinks to threat BL   CN III,IV,VI EOM intact, no gaze preference or deviation, no nystagmus   CN V normal sensation in V1, V2, and V3 segments bilaterally   CN VII no asymmetry, no nasolabial fold flattening   CN VIII normal hearing to  speech   CN IX & X normal palatal elevation, unable to visualize the uvula.   CN XI 5/5 head turn and 5/5 shoulder shrug bilaterally   CN XII midline tongue protrusion   Motor:  Muscle bulk: poor, tone normal, pronator drift none tremor none. No neck rigidity. Mvmt Root Nerve  Muscle Right Left Comments  SA C5/6 Ax Deltoid     EF C5/6 Mc Biceps     EE C6/7/8 Rad Triceps 5 5   WF C6/7 Med FCR 5 5   WE C7/8 PIN ECU     F Ab C8/T1 U ADM/FDI 5 5   HF L1/2/3 Fem Illopsoas 4 4   KE L2/3/4 Fem  Quad     DF L4/5 D Peron Tib Ant 4 4   PF S1/2 Tibial Grc/Sol 5 5    Reflexes:  Right Left Comments  Pectoralis      Biceps (C5/6) 1 1   Brachioradialis (C5/6) 1 1    Triceps (C6/7) 1 1    Patellar (L3/4) 2 2    Achilles (S1) 1 1    Hoffman      Plantar withdraws withdraws   Jaw jerk    Sensation:  Light touch    Pin prick    Temperature    Vibration   Proprioception    Coordination/Complex Motor:  - Finger to Nose intact BL - Heel to shin intact BL - Rapid alternating movement are slowed. - Gait: Deferred.  Labs   CBC:  Recent Labs  Lab 11/07/20 1542 11/08/20 0547 11/10/20 0909  WBC 4.4 4.9 6.8  NEUTROABS 2.5  --  3.9  HGB 13.1 13.0 13.5  HCT 40.9 38.7 41.1  MCV 90.7 87.8 88.4  PLT 121* 117* 115*    Basic Metabolic Panel:  Lab Results  Component Value Date   NA 137 11/10/2020   K 3.2 (L) 11/10/2020   CO2 26 11/10/2020   GLUCOSE 107 (H) 11/10/2020   BUN 35 (H) 11/10/2020   CREATININE 1.43 (H) 11/10/2020   CALCIUM 9.7 11/10/2020   GFRNONAA 34 (L) 11/10/2020   GFRAA 45 (L) 04/09/2020   Lipid Panel:  Lab Results  Component Value Date   LDLCALC 63 01/16/2018   HgbA1c:  Lab Results  Component Value Date   HGBA1C 5.5 01/16/2018   Urine Drug Screen: No results found for: LABOPIA, COCAINSCRNUR, LABBENZ, AMPHETMU, THCU, LABBARB  Alcohol Level No results found for: ETH  CT Head without contrast: CTH was negative for a large hypodensity concerning for a large  territory infarct or hyperdensity concerning for an ICH  MRI Brain: pending  rEEG:pending  Impression   Colleen Carson is a 87 y.o. female  with PMH significant for arthritis, Afibb on Eliquis, CHF, HLD, HTN, shingles, hx of strokes who presents with lethargy, tiredness and waxing and waning mentation. This is her 4th visit to a hospital in the last 2 weeks. Initial concern for a UTI was on Abx and then discontinued. Subsequent concern for dehydration and overdiuresis with improvement with gentle hydration and then discharged. She is being brought back in this time for waxing and waning mentation. Patient reports poor sleep over the last few days and has not slept at all last day due to a sore throat that would wake her up. Her neuro exam demonstrates delirium with poor attention, distractability, no focal deficit thou. She has recently had workup for reversible encephalopathy with normal Vit b12, folate and mildly elevated TSH. No neck rigidity or kernigs or brudzinskis sign on exam, no leukocytosis on exam. Does have a history of shingles but no clear vesicular lesions on her skin, just some bruises on her arms which I think are probably due to Peripheral IV placement. I suspect that this is most likely due to delirium. She does have history of prior strokes and is on eliquis. CTH is normal, will see if her pacemaker is compatible and we can get an MRI Brain.  Recommendations  - I ordered MRI Brain without contrast - I ordered a routine EEG. - If unable to get MRI Brain, recommend repeat CTH without contrast - Recommend delirium precautions and minimize vital checks overnight as safe. - I do  not have high suspicion for a CNS infection at this time. Will see how she is doing over the next couple of days. ______________________________________________________________________   Thank you for the opportunity to take part in the care of this patient. If you have any further questions, please contact  the neurology consultation attending.  Signed,  Erick Blinks [facility] Pager Number [phone] _ _ _   _ __   _ __ _ _  __ __   _ __   __ _

## 2020-11-10 NOTE — ED Notes (Signed)
Pt now finding her words more easily, voice getting clearer, easier to understand, pt started urinating some into purewick when requested by this RN. Pt immediately reported it was painful to urinate. Will send sample to lab if enough available. Visitor remains at bedside.

## 2020-11-10 NOTE — Progress Notes (Signed)
   11/10/20 2240  Assess: MEWS Score  Temp (!) 91.8 F (33.2 C)  BP (!) 152/78  Pulse Rate 60  Assess: MEWS Score  MEWS Temp 2  MEWS Systolic 0  MEWS Pulse 0  MEWS RR 0  MEWS LOC 0  MEWS Score 2  MEWS Score Color Yellow  Assess: if the MEWS score is Yellow or Red  Were vital signs taken at a resting state? Yes  Focused Assessment No change from prior assessment  Early Detection of Sepsis Score *See Row Information* Medium  MEWS guidelines implemented *See Row Information* Yes  Treat  MEWS Interventions Escalated (See documentation below)  Escalate  MEWS: Escalate Yellow: discuss with charge nurse/RN and consider discussing with provider and RRT  Notify: Charge Nurse/RN  Name of Charge Nurse/RN Notified Viviann Spare RN  Date Charge Nurse/RN Notified 11/10/20  Time Charge Nurse/RN Notified 2247

## 2020-11-10 NOTE — ED Provider Notes (Signed)
[facility] Emergency Department Provider Note  ____________________________________________   Event Date/Time   First MD Initiated Contact with Patient 11/10/20 [phone]     (approximate)  I have reviewed the triage vital signs and the nursing notes.   HISTORY  Chief Complaint Altered Mental Status   HPI LILANA BLASKO is a 87 y.o. female 87 y.o.femalewith medical history significant ofatrial fibrillation on anticoagulation, lymphedema, stroke, CHF, pacemaker, and recent admission 2/16-2/17 for generalized weakness likely secondary to dehydration and overdiuresis in the setting of recent UTI who presents via EMS from home for assessment of altered mental status per EMS.  EMS was called by patient's daughter who states the patient was nonsensical.  On arrival patient states "I just do not feel good".  She endorses mild sore throat and burning with urination.  She denies any specific pain in her chest, abdomen, back head arms legs or any other clear acute sick symptoms.  No vomiting or cough.  Per her daughter she seemed to have difficulty with her words today and seem to be shaking in her arms and felt warm.  She also seems much weaker than usual.         Past Medical History:  Diagnosis Date  . Arthritis   . Atrial fibrillation (HCC)   . CHF (congestive heart failure) (HCC)   . Dysrhythmia    Afib  . Hypercholesteremia   . Hypertension   . Lymphedema    lower extremities  . S/P total hip arthroplasty 01/25/2015  . Shingles   . Stroke [facility])    noted ischemia on CT scan    Patient Active Problem List   Diagnosis Date Noted  . Generalized weakness 11/10/2020  . Weakness   . Thrombocytopenia (HCC)   . Chronic anticoagulation   . Acute on chronic diastolic heart failure (HCC) 11/29/2019  . Chronic diastolic heart failure (HCC) 05/17/2018  . Chronic systolic heart failure (HCC) 03/04/2017  . Anxiety 03/04/2017  . TIA (transient ischemic attack)  12/24/2016  . Acute encephalopathy 10/01/2016  . Symptomatic bradycardia 05/15/2016  . HTN (hypertension) 05/15/2016  . HLD (hyperlipidemia) 05/15/2016  . Atrial fibrillation (HCC) 03/11/2016  . AKI (acute kidney injury) (HCC) 05/06/2015  . S/P total hip arthroplasty 01/25/2015  . Primary osteoarthritis of left hip 01/23/2015    Past Surgical History:  Procedure Laterality Date  . JOINT REPLACEMENT  01/23/15   Left THR Dr. Rosita Kea   . PACEMAKER INSERTION  2017  . TOTAL HIP ARTHROPLASTY Left 01/23/2015   Procedure: TOTAL HIP ARTHROPLASTY ANTERIOR APPROACH;  Surgeon: Kennedy Bucker, MD;  Location: [facility] ORS;  Service: Orthopedics;  Laterality: Left;    Prior to Admission medications   Medication Sig Start Date End Date Taking? Authorizing Provider  apixaban (ELIQUIS) 2.5 MG TABS tablet Take 1 tablet (2.5 mg total) by mouth 2 (two) times daily. 11/08/20 12/08/20  Lynn Ito, MD  atorvastatin (LIPITOR) 40 MG tablet Take 1 tablet (40 mg total) by mouth at bedtime. 01/16/18   Adrian Saran, MD  cetirizine (ZYRTEC) 10 MG tablet Take 10 mg by mouth daily.    [provider]  Cranberry 1000 MG CAPS Take by mouth daily.    [provider]  Multiple Vitamins-Minerals (CENTRUM SILVER 50+WOMEN PO) Take 1 tablet by mouth daily.    [provider]  torsemide (DEMADEX) 20 MG tablet Take 2 tablets (40 mg total) by mouth daily. Patient taking differently: Take 20 mg by mouth daily. 12/07/19 04/18/20  Bing Neighbors,  Jarold Songina A, FNP    Allergies Clonazepam, Morphine, and Morphine and related  Family History  Problem Relation Age of Onset  . Hypertension Other     Social History Social History   Tobacco Use  . Smoking status: Never Smoker  . Smokeless tobacco: Never Used  Vaping Use  . Vaping Use: Never used  Substance Use Topics  . Alcohol use: No  . Drug use: No    Review of Systems  Review of Systems  Constitutional: Positive for malaise/fatigue. Negative for chills and fever.   HENT: Positive for sore throat.   Eyes: Negative for pain.  Respiratory: Negative for cough and stridor.   Cardiovascular: Negative for chest pain.  Gastrointestinal: Negative for vomiting.  Genitourinary: Positive for dysuria.  Musculoskeletal: Negative for myalgias.  Skin: Negative for rash.  Neurological: Positive for tremors and weakness. Negative for seizures, loss of consciousness and headaches.  Psychiatric/Behavioral: Negative for suicidal ideas.  All other systems reviewed and are negative.     ____________________________________________   PHYSICAL EXAM:  VITAL SIGNS: ED Triage Vitals  Enc Vitals Group     BP      Pulse      Resp      Temp      Temp src      SpO2      Weight      Height      Head Circumference      Peak Flow      Pain Score      Pain Loc      Pain Edu?      Excl. in GC?    Vitals:   11/10/20 0900 11/10/20 1018  BP: (!) 148/85 (!) 151/95  Pulse: 79 80  Resp: 20 12  Temp:    SpO2: 100% 98%   Physical Exam Vitals and nursing note reviewed.  Constitutional:      General: She is not in acute distress.    Appearance: She is well-developed and well-nourished.  HENT:     Head: Normocephalic and atraumatic.     Right Ear: External ear normal.     Left Ear: External ear normal.     Nose: Nose normal.  Eyes:     Conjunctiva/sclera: Conjunctivae normal.  Cardiovascular:     Rate and Rhythm: Normal rate and regular rhythm.     Heart sounds: No murmur heard.   Pulmonary:     Effort: Pulmonary effort is normal. No respiratory distress.     Breath sounds: Normal breath sounds.  Abdominal:     Palpations: Abdomen is soft.     Tenderness: There is no abdominal tenderness.  Musculoskeletal:     Cervical back: Neck supple.     Right lower leg: Edema present.     Left lower leg: Edema present.  Skin:    General: Skin is warm and dry.  Neurological:     Mental Status: She is alert.  Psychiatric:        Mood and Affect: Mood and  affect normal.     PERRLA.  EOMI.  No facial droop.  Patient is able to give this examiner thumbs up with both hands and move both of her feet on command.  Sensation is intact to light touch of all extremities.  Oropharynx unremarkable.  No stridor over the neck.  Anterior neck is unremarkable.  ____________________________________________   LABS (all labs ordered are listed, but only abnormal results are displayed)  Labs Reviewed  CBC WITH DIFFERENTIAL/PLATELET -  Abnormal; Notable for the following components:      Result Value   RDW 15.9 (*)    Platelets 115 (*)    All other components within normal limits  COMPREHENSIVE METABOLIC PANEL - Abnormal; Notable for the following components:   Potassium 3.2 (*)    Chloride 95 (*)    Glucose, Bld 107 (*)    BUN 35 (*)    Creatinine, Ser 1.43 (*)    AST 53 (*)    Alkaline Phosphatase 226 (*)    Total Bilirubin 1.4 (*)    GFR, Estimated 34 (*)    Anion gap 16 (*)    All other components within normal limits  TSH - Abnormal; Notable for the following components:   TSH 7.988 (*)    All other components within normal limits  URINALYSIS, COMPLETE (UACMP) WITH MICROSCOPIC - Abnormal; Notable for the following components:   Color, Urine YELLOW (*)    APPearance CLEAR (*)    All other components within normal limits  TROPONIN I (HIGH SENSITIVITY) - Abnormal; Notable for the following components:   Troponin I (High Sensitivity) 25 (*)    All other components within normal limits  RESP PANEL BY RT-PCR (FLU A&B, COVID) ARPGX2  PROCALCITONIN  T4, FREE  CK  TROPONIN I (HIGH SENSITIVITY)   ____________________________________________  EKG  AV dual paced complexes with a ventricular rate of 82. ____________________________________________  RADIOLOGY  ED MD interpretation: CT head shows no evidence of acute or subacute CVA, SAH or other clear acute intracranial process.  CT abdomen pelvis shows no acute abdominal pelvic process.   Chest x-ray shows no evidence of pneumonia, acute volume overload or any other clear acute intrathoracic process.  Official radiology report(s): CT ABDOMEN PELVIS WO CONTRAST  Result Date: 11/10/2020 CLINICAL DATA:  87 year old female with abdominal pain, dysuria and altered mental status. EXAM: CT ABDOMEN AND PELVIS WITHOUT CONTRAST TECHNIQUE: Multidetector CT imaging of the abdomen and pelvis was performed following the standard protocol without IV contrast. COMPARISON:  08/09/2019 chest CT. FINDINGS: Please note that parenchymal abnormalities may be missed without intravenous contrast. Lower chest: Cardiomegaly, trace bilateral pleural effusion and pacemaker leads are present. Mild bibasilar atelectasis is noted. Hepatobiliary: No definite hepatic abnormality noted. No gallbladder abnormalities are identified. No biliary dilatation. Pancreas: Unremarkable Spleen: Unremarkable Adrenals/Urinary Tract: Renal cortical thinning noted. Multiple lesions within both kidneys probably represent cysts. There is no evidence of hydronephrosis or obstructing urinary calculi. Renal vascular calcifications are present. The adrenal glands are unremarkable. The bladder is not well distended. Stomach/Bowel: There is no evidence of bowel obstruction or pneumoperitoneum. Colonic diverticulosis noted without definite evidence of acute diverticulitis. No suspicious bowel wall thickening is noted. Vascular/Lymphatic: Aortic atherosclerosis. No enlarged abdominal or pelvic lymph nodes. Reproductive: No significant abnormality identified but LEFT hip replacement artifact obscures detail within the pelvis. Other: There may be a trace amount of free fluid within the posterior LOWER pelvis. Musculoskeletal: LEFT hip arthroplasty changes noted. Degenerative changes within the lumbar spine are present. IMPRESSION: 1. Question trace free fluid within the pelvis of uncertain etiology. 2. Cardiomegaly, trace bilateral pleural effusions and  mild bibasilar atelectasis. 3. Colonic diverticulosis without definite evidence of acute diverticulitis. 4. Aortic Atherosclerosis (ICD10-I70.0). Electronically Signed   By: Harmon Pier M.D.   On: 11/10/2020 09:59   DG Chest 1 View  Result Date: 11/10/2020 CLINICAL DATA:  87 year old female with altered mental status EXAM: CHEST  1 VIEW COMPARISON:  11/07/2020 FINDINGS: Cardiomediastinal silhouette unchanged. Pacing device  on the left chest wall with 2 leads in place. Calcifications of the aortic arch. No pneumothorax. Similar appearance at the left lung base with blunting at the left costophrenic angle. Coarsened interstitial markings throughout with no interlobular septal thickening. No confluent airspace disease. IMPRESSION: Similar appearance of the chest x-ray with likely scarring at the left lung base and no evidence of acute cardiopulmonary disease. Unchanged left chest wall pacing device and cardiomegaly. Electronically Signed   By: Gilmer Mor D.O.   On: 11/10/2020 09:55   CT Head Wo Contrast  Result Date: 11/10/2020 CLINICAL DATA:  Mental status change with unknown cause EXAM: CT HEAD WITHOUT CONTRAST TECHNIQUE: Contiguous axial images were obtained from the base of the skull through the vertex without intravenous contrast. COMPARISON:  06/21/2019 FINDINGS: Brain: No evidence of acute infarction, hemorrhage, hydrocephalus, extra-axial collection or mass lesion/mass effect. Remote bilateral occipital infarct at the poles. Vascular: Atheromatous calcification. Skull: Normal. Negative for fracture or focal lesion. Sinuses/Orbits: No acute finding. IMPRESSION: No acute or reversible finding.  Stable from 2020. Electronically Signed   By: Marnee Spring M.D.   On: 11/10/2020 09:42    ____________________________________________   PROCEDURES  Procedure(s) performed (including Critical Care):  Procedures   ____________________________________________   INITIAL IMPRESSION / ASSESSMENT  AND PLAN / ED COURSE      Patient presents with above to history exam for assessment of altered mental status per daughter.  On arrival patient is afebrile and hemodynamically stable.  She has a nonfocal neuro exam although is very sleepy and does not participate much in history but endorses mild sore throat and some burning with urination.  Per daughter she seemed to have difficulty with her words this morning and has not seemed back to herself since coming home from the hospital on 17th.  Differential includes but is not limited to acute infectious process, metabolic derangement, SAH, arrhythmia, thyroid derangement, liver failure, CVA, and polypharmacy.  No clear history or exam factors to suggest acute traumatic injury.  Very low suspicion for toxic ingestion.  No focal deficits on exam to suggest clear CVA although patient does not fully participate including testing of finger-to-nose, pronator drift or other features other maneuvers other than noted above.  CT head is unremarkable.  Chest x-ray shows no evidence of acute volume overload or infection.  CT abdomen pelvis shows cardiomegaly and very small bilateral pleural effusions as well as some colonic diverticuli without evidence of diverticulitis and the evidence of aortic atherosclerosis.  No evidence of other acute abdominal or pelvic pathology.  CBC shows no leukocytosis or acute anemia.  CMP shows K of 3.2 but no other significant derangements.  Creatinine at 1.43 has improved from 2 days ago when it was noted to be 1.61.  UA does not appear infected.  Covid is negative.  Troponin is 25 and at baseline as it was 25 and 22 2 days ago and I will assess patient for ACS.  On several reassessments patient is noted to have some generalized shaking stating she is hurting all over.  She was given some Tylenol and potassium.  Her daughter states she is typically able to ambulate with a walker at home and was able to use after returning from emergency  room and is not sure why she is in so much pain.  Unclear source at this time given patient does seem weaker than baseline and is in significant pain upon admit to medicine for further evaluation management.  ____________________________________________   FINAL CLINICAL  IMPRESSION(S) / ED DIAGNOSES  Final diagnoses:  Generalized pain  Generalized weakness  Hypokalemia  TSH elevation    Medications  acetaminophen (TYLENOL) tablet 1,000 mg (1,000 mg Oral Given 11/10/20 1056)  potassium chloride SA (KLOR-CON) CR tablet 40 mEq (40 mEq Oral Given 11/10/20 1055)     ED Discharge Orders    None       Note:  This document was prepared using Dragon voice recognition software and may include unintentional dictation errors.   Gilles Chiquito, MD 11/10/20 1106

## 2020-11-10 NOTE — ED Notes (Addendum)
Pt denies HA but reports it hurts to open her eyes. States she "can't see" and that the light hurts her eyes. Pt pointing to throat making complaint about her "thyroid". Was going to give pt opportunity to take oral meds but pt seems to fluctuate even while this RN at bedside between fluid, clear speech and aphasia/garbled speech. Will keep NPO.

## 2020-11-10 NOTE — ED Notes (Signed)
Pt reports it hurts to swallow and that her lower abd hurts. Pt dry; no urine noted in canister; pt will not attempt to urinate. Abd soft but tender to pt. Will complete bladder scan soon. Pt's speech somewhat hard to understand, can't find her words to the same extent as she was able to earlier. Anxious, grimacing, shaking at times.

## 2020-11-10 NOTE — Progress Notes (Signed)
Patient's is a yellow MEWS because of her low temperature. NP notified and warm blankets have been applied to the patient. Will recheck temp in 2 hours and use bear hugger if there is no improvement.

## 2020-11-10 NOTE — ED Notes (Signed)
Pt presents to ED via EMS from home. Per EMS pt has been seen multiple times due to a UTI. Pt is currently alert & oriented to place and person. Pt denies pain at this time. Pt has been on multiple ABX for UTI recently and was recently discharged on 2/17 from [facility]. Pt was catheterized for urine, cardiac monitor (pacemaker noted) placed, and IV established with blood draw. Pt denies fevers or chills but presents cold to touch, but pt does have episodes of shaking and pt states "it happens before I pee". When asked if pt had burning on urination, pt states "yes". Pt also seems to have increased frequency. Pt arrives with depends in place but dry, external purewick placed for pt comfort and blanket provided as well.

## 2020-11-10 NOTE — Progress Notes (Signed)
NP notified that MRI has been cancelled due to patient having a pacemaker.

## 2020-11-10 NOTE — ED Notes (Signed)
Daughter remains at bedside. Bed locked low. Rails up. Call bell within reach. Daughter has concerns about pt; daughter wanting update on plan; wanting to speak with doctor; requesting an MRI and other tests. Attending Agbata notified via secure chat.

## 2020-11-11 ENCOUNTER — Encounter: Payer: Self-pay | Admitting: Internal Medicine

## 2020-11-11 ENCOUNTER — Inpatient Hospital Stay: Payer: Medicare Other

## 2020-11-11 DIAGNOSIS — N1832 Chronic kidney disease, stage 3b: Secondary | ICD-10-CM | POA: Diagnosis present

## 2020-11-11 DIAGNOSIS — E876 Hypokalemia: Secondary | ICD-10-CM | POA: Diagnosis present

## 2020-11-11 DIAGNOSIS — R410 Disorientation, unspecified: Principal | ICD-10-CM

## 2020-11-11 DIAGNOSIS — I890 Lymphedema, not elsewhere classified: Secondary | ICD-10-CM | POA: Diagnosis present

## 2020-11-11 DIAGNOSIS — R1310 Dysphagia, unspecified: Secondary | ICD-10-CM | POA: Diagnosis present

## 2020-11-11 DIAGNOSIS — Z79899 Other long term (current) drug therapy: Secondary | ICD-10-CM | POA: Diagnosis not present

## 2020-11-11 DIAGNOSIS — Z20822 Contact with and (suspected) exposure to covid-19: Secondary | ICD-10-CM | POA: Diagnosis present

## 2020-11-11 DIAGNOSIS — Z66 Do not resuscitate: Secondary | ICD-10-CM | POA: Diagnosis present

## 2020-11-11 DIAGNOSIS — M1990 Unspecified osteoarthritis, unspecified site: Secondary | ICD-10-CM | POA: Diagnosis present

## 2020-11-11 DIAGNOSIS — E875 Hyperkalemia: Secondary | ICD-10-CM | POA: Diagnosis present

## 2020-11-11 DIAGNOSIS — D696 Thrombocytopenia, unspecified: Secondary | ICD-10-CM | POA: Diagnosis present

## 2020-11-11 DIAGNOSIS — E860 Dehydration: Secondary | ICD-10-CM | POA: Diagnosis present

## 2020-11-11 DIAGNOSIS — E041 Nontoxic single thyroid nodule: Secondary | ICD-10-CM | POA: Diagnosis present

## 2020-11-11 DIAGNOSIS — Z8673 Personal history of transient ischemic attack (TIA), and cerebral infarction without residual deficits: Secondary | ICD-10-CM | POA: Diagnosis not present

## 2020-11-11 DIAGNOSIS — Z7901 Long term (current) use of anticoagulants: Secondary | ICD-10-CM | POA: Diagnosis not present

## 2020-11-11 DIAGNOSIS — I480 Paroxysmal atrial fibrillation: Secondary | ICD-10-CM | POA: Diagnosis present

## 2020-11-11 DIAGNOSIS — I5032 Chronic diastolic (congestive) heart failure: Secondary | ICD-10-CM | POA: Diagnosis not present

## 2020-11-11 DIAGNOSIS — N179 Acute kidney failure, unspecified: Secondary | ICD-10-CM | POA: Diagnosis present

## 2020-11-11 DIAGNOSIS — F419 Anxiety disorder, unspecified: Secondary | ICD-10-CM | POA: Diagnosis present

## 2020-11-11 DIAGNOSIS — E785 Hyperlipidemia, unspecified: Secondary | ICD-10-CM | POA: Diagnosis present

## 2020-11-11 DIAGNOSIS — R680 Hypothermia, not associated with low environmental temperature: Secondary | ICD-10-CM | POA: Diagnosis not present

## 2020-11-11 DIAGNOSIS — Z96642 Presence of left artificial hip joint: Secondary | ICD-10-CM | POA: Diagnosis present

## 2020-11-11 DIAGNOSIS — Z87440 Personal history of urinary (tract) infections: Secondary | ICD-10-CM | POA: Diagnosis not present

## 2020-11-11 DIAGNOSIS — I5042 Chronic combined systolic (congestive) and diastolic (congestive) heart failure: Secondary | ICD-10-CM | POA: Diagnosis present

## 2020-11-11 DIAGNOSIS — R531 Weakness: Secondary | ICD-10-CM | POA: Diagnosis not present

## 2020-11-11 DIAGNOSIS — Z950 Presence of cardiac pacemaker: Secondary | ICD-10-CM | POA: Diagnosis not present

## 2020-11-11 DIAGNOSIS — I130 Hypertensive heart and chronic kidney disease with heart failure and stage 1 through stage 4 chronic kidney disease, or unspecified chronic kidney disease: Secondary | ICD-10-CM | POA: Diagnosis present

## 2020-11-11 LAB — BASIC METABOLIC PANEL
Anion gap: 14 (ref 5–15)
BUN: 36 mg/dL — ABNORMAL HIGH (ref 8–23)
CO2: 25 mmol/L (ref 22–32)
Calcium: 9.5 mg/dL (ref 8.9–10.3)
Chloride: 99 mmol/L (ref 98–111)
Creatinine, Ser: 1.34 mg/dL — ABNORMAL HIGH (ref 0.44–1.00)
GFR, Estimated: 37 mL/min — ABNORMAL LOW (ref >60)
Glucose, Bld: 89 mg/dL (ref 70–99)
Potassium: 3.5 mmol/L (ref 3.5–5.1)
Sodium: 138 mmol/L (ref 135–145)

## 2020-11-11 LAB — CBC
HCT: 37.6 % (ref 36.0–46.0)
Hemoglobin: 12.6 g/dL (ref 12.0–15.0)
MCH: 29 pg (ref 26.0–34.0)
MCHC: 33.5 g/dL (ref 30.0–36.0)
MCV: 86.6 fL (ref 80.0–100.0)
Platelets: 108 10*3/uL — ABNORMAL LOW (ref 150–400)
RBC: 4.34 MIL/uL (ref 3.87–5.11)
RDW: 15.8 % — ABNORMAL HIGH (ref 11.5–15.5)
WBC: 4.4 10*3/uL (ref 4.0–10.5)
nRBC: 0 % (ref 0.0–0.2)

## 2020-11-11 NOTE — Evaluation (Signed)
Physical Therapy Evaluation Patient Details Name: Colleen Carson MRN: [id number] DOB: [birth year] Today's Date: 11/11/2020   History of Present Illness  Patient is a 87 year old female who presents to the ER via EMS for evaluation of pain with swallowing as well as generalized weakness and confusion.  Patient was recently discharged from the hospital and according to the daughter has declined since her discharge and is very concerned about her mental status changes.  She is also concerned that patient is very weak and unable to ambulate. PMH: a-fib, HTN, CHF, hx of CVA, dyslipidemia, arthritis, lymphademia (LE), s/p THA, shingles  Clinical Impression  Pt seen for PT evaluation with pt's daughter arriving shortly after PT & providing PLOF & home set up information. Pt very confused throughout, speaking of various things causing pain - monitored during session. Pt is able to complete bed mobility with min assist but requires +2 to attempt sit<>stand with pt ultimately unable to each time 2/2 BLE knees buckling. Pt with eyes closing once sitting EOB 2/2 fatigue so pt assisted back to bed & positioned in chair position. Pt would benefit from STR upon d/c to maximize independence with functional mobility & reduce fall risk prior to return home. Educated pt's daughter on recommendation of STR & she is in agreement. Will continue to follow pt acutely to progress transfers & gait as able.     Follow Up Recommendations SNF    Equipment Recommendations  None recommended by PT    Recommendations for Other Services       Precautions / Restrictions Precautions Precautions: Fall Restrictions Weight Bearing Restrictions: No      Mobility  Bed Mobility Overal bed mobility: Needs Assistance Bed Mobility: Supine to Sit;Sit to Supine     Supine to sit: Min assist;HOB elevated Sit to supine: Min assist;HOB elevated   General bed mobility comments: use of bed rails, cuing to initiate movement     Transfers Overall transfer level: Needs assistance Equipment used: Rolling walker (2 wheeled) Transfers: Sit to/from Stand Sit to Stand: Max assist;+2 physical assistance         General transfer comment: PT blocks knees, pt attempts sit<>Stand 3-4 times with RW & max +1 then +2 assist but pt unable to come to full upright standing 2/2 BLE knee buckling each time.  Ambulation/Gait                Stairs            Wheelchair Mobility    Modified Rankin (Stroke Patients Only)       Balance Overall balance assessment: No apparent balance deficits (not formally assessed);Needs assistance Sitting-balance support: Feet supported;Bilateral upper extremity supported;No upper extremity supported Sitting balance-Leahy Scale: Fair Sitting balance - Comments: close supervision sitting EOB   Standing balance support: Bilateral upper extremity supported Standing balance-Leahy Scale: Zero                               Pertinent Vitals/Pain Pain Assessment: Faces Faces Pain Scale: Hurts a little bit Pain Location: pt with varying c/o pain, initially stating BUE & BLE are "burning" then later tells daughter her head & R side of neck hurt Pain Intervention(s): Monitored during session (nurse in room reporting she will give pt pain meds)    Home Living Family/patient expects to be discharged to:: Private residence Living Arrangements: Children Available Help at Discharge: Family;Available PRN/intermittently Type of Home: House Home Access: Stairs  to enter Entrance Stairs-Rails: Can reach both Entrance Stairs-Number of Steps: 3 Home Layout: Two level;Able to live on main level with bedroom/bathroom Home Equipment: Walker - 4 wheels;Shower seat - built in;Grab bars - tub/shower;Walker - 2 wheels Additional Comments: Daughter works part time and son in Social worker works from home    Prior Function           Comments: Prior to initial admission pt was independent  without AD in the home, used rollator for longer/outdoor distances. Cooking, dressing without assistance. Pt with slow functional decline with daughter reporting pt has hardly been out of bed since returning home following previous admission.     Hand Dominance   Dominant Hand: Right    Extremity/Trunk Assessment   Upper Extremity Assessment Upper Extremity Assessment: Generalized weakness    Lower Extremity Assessment Lower Extremity Assessment: Generalized weakness (pt able to perform 3-/5 knee extension seated EOB but BLE buckle during each sit<>stand attempt)       Communication      Cognition Arousal/Alertness: Awake/alert   Overall Cognitive Status: Impaired/Different from baseline Area of Impairment: Attention;Orientation;Following commands;Memory;Awareness;Problem solving;Safety/judgement                 Orientation Level: Disoriented to;Situation;Place (only oriented to year, unable to recall current month)   Memory: Decreased recall of precautions;Decreased short-term memory Following Commands: Follows one step commands inconsistently;Follows one step commands with increased time Safety/Judgement: Decreased awareness of safety;Decreased awareness of deficits Awareness: Intellectual Problem Solving: Slow processing;Decreased initiation General Comments: pt frequently stating the "flashing lights" or hearing her daughter "talk about the dog" caused her pain      General Comments General comments (skin integrity, edema, etc.): HR 63 bpm at rest, increases to 129 bpm with first standing attempt but quickly decreases to 80's then 60s bpm    Exercises     Assessment/Plan    PT Assessment Patient needs continued PT services  PT Problem List Decreased activity tolerance;Decreased mobility;Cardiopulmonary status limiting activity;Decreased safety awareness;Decreased knowledge of precautions;Decreased knowledge of use of DME;Decreased balance;Decreased  strength;Decreased cognition;Pain       PT Treatment Interventions DME instruction;Gait training;Stair training;Functional mobility training;Therapeutic activities;Therapeutic exercise;Balance training;Patient/family education;Neuromuscular re-education;Cognitive remediation;Wheelchair mobility training;Modalities    PT Goals (Current goals can be found in the Care Plan section)  Acute Rehab PT Goals Patient Stated Goal: return to PLOF PT Goal Formulation: With patient/family Time For Goal Achievement: 11/25/20 Potential to Achieve Goals: Fair    Frequency Min 2X/week   Barriers to discharge Decreased caregiver support;Inaccessible home environment      Co-evaluation               AM-PAC PT "6 Clicks" Mobility  Outcome Measure Help needed turning from your back to your side while in a flat bed without using bedrails?: A Little Help needed moving from lying on your back to sitting on the side of a flat bed without using bedrails?: A Lot Help needed moving to and from a bed to a chair (including a wheelchair)?: Total Help needed standing up from a chair using your arms (e.g., wheelchair or bedside chair)?: Total Help needed to walk in hospital room?: Total Help needed climbing 3-5 steps with a railing? : Total 6 Click Score: 9    End of Session Equipment Utilized During Treatment: Gait belt Activity Tolerance: Patient limited by fatigue Patient left: in bed;with call bell/phone within reach;with bed alarm set;with family/visitor present;with nursing/sitter in room Nurse Communication: Mobility status PT Visit Diagnosis: Muscle  weakness (generalized) (M62.81);Difficulty in walking, not elsewhere classified (R26.2)    Time: 3016-0109 PT Time Calculation (min) (ACUTE ONLY): 32 min   Charges:   PT Evaluation $PT Eval Moderate Complexity: 1 Mod PT Treatments $Therapeutic Activity: 23-37 mins        Aleda Grana, PT, DPT 11/11/20, 10:52 AM   Sandi Mariscal 11/11/2020, 10:50 AM

## 2020-11-11 NOTE — Consult Note (Signed)
[facility]   Jamey Reaseodoro Keith Devika Dragovich, M.D.  Reason for Consult: Dysphagia, odynophagia   Attending Requesting Consult: Dr. Joylene IgoAgbata   History of Present Illness: [name] is a 87 y.o. female who presented yesterday with significant confusion, brought in by her daughter for odynophagia and dysphagia. Mentation appears much improved today, however, and patient is interactive in no acute distress. Patient says she has no dysphagia or odynophagia to breads, eggs, other soft foods. She has some c/o odynophagia to solids like chicken, meat, some raw vegetables. She grows tomatoes in her garden and can have difficulty swallowing these as well. No personal history of immunocompromised state, recent anbiotics, toxin ingestion, etc.  Past Medical History:  Past Medical History:  Diagnosis Date  . Arthritis   . Atrial fibrillation (HCC)   . CHF (congestive heart failure) (HCC)   . Dysrhythmia    Afib  . Hypercholesteremia   . Hypertension   . Lymphedema    lower extremities  . S/P total hip arthroplasty 01/25/2015  . Shingles   . Stroke [facility](HCC)    noted ischemia on CT scan    Problem List: Patient Active Problem List   Diagnosis Date Noted  . Delirium 11/11/2020  . Generalized weakness 11/10/2020  . Weakness   . Thrombocytopenia (HCC)   . Chronic anticoagulation   . Acute on chronic diastolic heart failure (HCC) 11/29/2019  . Chronic diastolic heart failure (HCC) 05/17/2018  . Chronic systolic heart failure (HCC) 03/04/2017  . Anxiety 03/04/2017  . TIA (transient ischemic attack) 12/24/2016  . Acute encephalopathy 10/01/2016  . Symptomatic bradycardia 05/15/2016  . HTN (hypertension) 05/15/2016  . HLD (hyperlipidemia) 05/15/2016  . Atrial fibrillation (HCC) 03/11/2016  . AKI (acute kidney injury) (HCC) 05/06/2015  . S/P total hip arthroplasty 01/25/2015  . Primary osteoarthritis of left hip 01/23/2015    Past Surgical History: Past Surgical  History:  Procedure Laterality Date  . JOINT REPLACEMENT  01/23/15   Left THR Dr. Rosita KeaMenz   . PACEMAKER INSERTION  2017  . TOTAL HIP ARTHROPLASTY Left 01/23/2015   Procedure: TOTAL HIP ARTHROPLASTY ANTERIOR APPROACH;  Surgeon: Kennedy BuckerMichael Menz, MD;  Location: [facility] ORS;  Service: Orthopedics;  Laterality: Left;    Allergies: Allergies  Allergen Reactions  . Clonazepam Other (See Comments)    Heavy sedation  . Morphine Nausea Only and Other (See Comments)    Hallucinations, nausea, vomitting  . Morphine And Related Nausea And Vomiting    Home Medications: Medications Prior to Admission  Medication Sig Dispense Refill Last Dose  . apixaban (ELIQUIS) 2.5 MG TABS tablet Take 1 tablet (2.5 mg total) by mouth 2 (two) times daily. 60 tablet 0 11/09/2020 at Unknown time  . atorvastatin (LIPITOR) 40 MG tablet Take 1 tablet (40 mg total) by mouth at bedtime.   11/09/2020 at Unknown time  . cetirizine (ZYRTEC) 10 MG tablet Take 10 mg by mouth daily.   11/09/2020 at Unknown time  . Cranberry 1000 MG CAPS Take by mouth daily.   11/09/2020 at Unknown time  . Multiple Vitamins-Minerals (CENTRUM SILVER 50+WOMEN PO) Take 1 tablet by mouth daily.   11/09/2020 at Unknown time  . torsemide (DEMADEX) 20 MG tablet Take 2 tablets (40 mg total) by mouth daily. (Patient taking differently: Take 20 mg by mouth daily.) 120 tablet 3 Past Week at Unknown time   Home medication reconciliation was completed with the patient.   Scheduled Inpatient Medications:   . apixaban  2.5 mg Oral  BID  . atorvastatin  40 mg Oral QHS  . loratadine  10 mg Oral Daily  . multivitamin with minerals  1 tablet Oral Daily  . sodium chloride flush  3 mL Intravenous Q12H  . torsemide  20 mg Oral Daily    Continuous Inpatient Infusions:   . sodium chloride    . sodium chloride 30 mL/hr at 11/10/20 1558    PRN Inpatient Medications:  sodium chloride, acetaminophen **OR** acetaminophen, ondansetron **OR** ondansetron (ZOFRAN) IV, phenol,  sodium chloride flush  Family History: family history includes Hypertension in an other family member.   GI Family History: Negative  Social History:   reports that she has never smoked. She has never used smokeless tobacco. She reports that she does not drink alcohol and does not use drugs. The patient denies ETOH, tobacco, or drug use.    Review of Systems: Review of Systems - Negative except that in HPI  Physical Examination: BP 128/67 (BP Location: Left Arm)   Pulse 61   Temp (!) 94.8 F (34.9 C) (Rectal)   Resp 18   Ht 5\' 1"  (1.549 m)   LMP  (LMP Unknown)   SpO2 95%   BMI 25.41 kg/m  Physical Exam Constitutional:      General: She is not in acute distress.    Appearance: Normal appearance. She is not ill-appearing, toxic-appearing or diaphoretic.  HENT:     Head: Normocephalic and atraumatic.     Nose: Nose normal.  Eyes:     General: No scleral icterus.    Conjunctiva/sclera: Conjunctivae normal.     Pupils: Pupils are equal, round, and reactive to light.  Cardiovascular:     Rate and Rhythm: Normal rate.     Pulses: Normal pulses.     Heart sounds: No gallop.   Pulmonary:     Effort: Pulmonary effort is normal.     Breath sounds: Normal breath sounds.  Abdominal:     General: There is no distension.     Palpations: Abdomen is soft. There is no mass.     Tenderness: There is no abdominal tenderness. There is no guarding.  Musculoskeletal:     Cervical back: Normal range of motion.     Right lower leg: Edema present.     Left lower leg: Edema present.  Skin:    Capillary Refill: Capillary refill takes less than 2 seconds.  Neurological:     Mental Status: She is alert.  Psychiatric:        Mood and Affect: Mood normal.        Thought Content: Thought content normal.     Data: Lab Results  Component Value Date   WBC 4.4 11/11/2020   HGB 12.6 11/11/2020   HCT 37.6 11/11/2020   MCV 86.6 11/11/2020   PLT 108 (L) 11/11/2020   Recent Labs  Lab  11/08/20 0547 11/10/20 0909 11/11/20 0424  HGB 13.0 13.5 12.6   Lab Results  Component Value Date   NA 138 11/11/2020   K 3.5 11/11/2020   CL 99 11/11/2020   CO2 25 11/11/2020   BUN 36 (H) 11/11/2020   CREATININE 1.34 (H) 11/11/2020   Lab Results  Component Value Date   ALT 26 11/10/2020   AST 53 (H) 11/10/2020   ALKPHOS 226 (H) 11/10/2020   BILITOT 1.4 (H) 11/10/2020   No results for input(s): APTT, INR, PTT in the last 168 hours. CBC Latest Ref Rng & Units 11/11/2020 11/10/2020 11/08/2020  WBC 4.0 -  10.5 K/uL 4.4 6.8 4.9  Hemoglobin 12.0 - 15.0 g/dL 46.5 68.1 27.5  Hematocrit 36.0 - 46.0 % 37.6 41.1 38.7  Platelets 150 - 400 K/uL 108(L) 115(L) 117(L)    STUDIES: CT ABDOMEN PELVIS WO CONTRAST  Result Date: 11/10/2020 CLINICAL DATA:  87 year old female with abdominal pain, dysuria and altered mental status. EXAM: CT ABDOMEN AND PELVIS WITHOUT CONTRAST TECHNIQUE: Multidetector CT imaging of the abdomen and pelvis was performed following the standard protocol without IV contrast. COMPARISON:  08/09/2019 chest CT. FINDINGS: Please note that parenchymal abnormalities may be missed without intravenous contrast. Lower chest: Cardiomegaly, trace bilateral pleural effusion and pacemaker leads are present. Mild bibasilar atelectasis is noted. Hepatobiliary: No definite hepatic abnormality noted. No gallbladder abnormalities are identified. No biliary dilatation. Pancreas: Unremarkable Spleen: Unremarkable Adrenals/Urinary Tract: Renal cortical thinning noted. Multiple lesions within both kidneys probably represent cysts. There is no evidence of hydronephrosis or obstructing urinary calculi. Renal vascular calcifications are present. The adrenal glands are unremarkable. The bladder is not well distended. Stomach/Bowel: There is no evidence of bowel obstruction or pneumoperitoneum. Colonic diverticulosis noted without definite evidence of acute diverticulitis. No suspicious bowel wall thickening  is noted. Vascular/Lymphatic: Aortic atherosclerosis. No enlarged abdominal or pelvic lymph nodes. Reproductive: No significant abnormality identified but LEFT hip replacement artifact obscures detail within the pelvis. Other: There may be a trace amount of free fluid within the posterior LOWER pelvis. Musculoskeletal: LEFT hip arthroplasty changes noted. Degenerative changes within the lumbar spine are present. IMPRESSION: 1. Question trace free fluid within the pelvis of uncertain etiology. 2. Cardiomegaly, trace bilateral pleural effusions and mild bibasilar atelectasis. 3. Colonic diverticulosis without definite evidence of acute diverticulitis. 4. Aortic Atherosclerosis (ICD10-I70.0). Electronically Signed   By: Harmon Pier M.D.   On: 11/10/2020 09:59   DG Chest 1 View  Result Date: 11/10/2020 CLINICAL DATA:  87 year old female with altered mental status EXAM: CHEST  1 VIEW COMPARISON:  11/07/2020 FINDINGS: Cardiomediastinal silhouette unchanged. Pacing device on the left chest wall with 2 leads in place. Calcifications of the aortic arch. No pneumothorax. Similar appearance at the left lung base with blunting at the left costophrenic angle. Coarsened interstitial markings throughout with no interlobular septal thickening. No confluent airspace disease. IMPRESSION: Similar appearance of the chest x-ray with likely scarring at the left lung base and no evidence of acute cardiopulmonary disease. Unchanged left chest wall pacing device and cardiomegaly. Electronically Signed   By: Gilmer Mor D.O.   On: 11/10/2020 09:55   CT HEAD WO CONTRAST  Result Date: 11/11/2020 CLINICAL DATA:  Confusion. EXAM: CT HEAD WITHOUT CONTRAST TECHNIQUE: Contiguous axial images were obtained from the base of the skull through the vertex without intravenous contrast. COMPARISON:  Head CT 11/10/2020. FINDINGS: Brain: No evidence of acute infarction, hemorrhage, hydrocephalus, extra-axial collection or mass lesion/mass effect.  Remote bilateral PCA infarcts again seen. Vascular: No hyperdense vessel or unexpected calcification. Skull: Intact.  No focal lesion. Sinuses/Orbits: Negative. Other: None. IMPRESSION: No acute abnormality or change compared to yesterday's exam. Remote bilateral PCA infarcts. Electronically Signed   By: Drusilla Kanner M.D.   On: 11/11/2020 13:54   CT Head Wo Contrast  Result Date: 11/10/2020 CLINICAL DATA:  Mental status change with unknown cause EXAM: CT HEAD WITHOUT CONTRAST TECHNIQUE: Contiguous axial images were obtained from the base of the skull through the vertex without intravenous contrast. COMPARISON:  06/21/2019 FINDINGS: Brain: No evidence of acute infarction, hemorrhage, hydrocephalus, extra-axial collection or mass lesion/mass effect. Remote bilateral occipital infarct at  the poles. Vascular: Atheromatous calcification. Skull: Normal. Negative for fracture or focal lesion. Sinuses/Orbits: No acute finding. IMPRESSION: No acute or reversible finding.  Stable from 2020. Electronically Signed   By: Marnee Spring M.D.   On: 11/10/2020 09:42   @IMAGES @  Assessment:  1. Dysphagia/odynophagia - DDx includes esophageal stricture, infection (HSV or Candida esophagitis), Erosive esophagitis, malignancy, autoimmmune disease I.e. bullous pemphigoid (rare), esophageal dysmotility (achalasia, DES, etc.), complex paraesophageal hernia.  2. Altered mental status - improved.  3. Advanced age  24. DNR status  COVID-19 status: Tested negative   Recommendations:  1. Barium swallow x-ray with tablet in AM. Can consider endoluminal evaluation if indicated afterwards with patient and family's consent. 2. Continue diet as tolerated. 3. Following  Thank you for the consult. Please call with questions or concerns.  10, "Rosina Lowenstein MD [facility] [address] [place], [place] [phone]  11/11/2020 3:33 PM

## 2020-11-11 NOTE — Progress Notes (Addendum)
Progress Note    Colleen Carson  RKY:[id number] DOB: May 07, 1925  DOA: 11/10/2020 PCP: Kandyce Rud, MD      Brief Narrative:    Medical records reviewed and are as summarized below:  Colleen Carson is a 87 y.o. female with medical history significant for atrial fibrillation on Eliquis, hypertension, chronic diastolic CHF, moderate to severe TR, history of stroke, dyslipidemia, recent discharge from the hospital on November 08, 2020.  She was brought to the hospital again because of confusion, pain on swallowing and difficulty swallowing.      Assessment/Plan:   Principal Problem:   Generalized weakness Active Problems:   Atrial fibrillation (HCC)   HTN (hypertension)   Chronic diastolic heart failure (HCC)   Thrombocytopenia (HCC)    Body mass index is 25.41 kg/m.    Altered mental status, likely delirium: Initial CT head did not show any acute abnormality.  Repeat CT head is pending.  She cannot have an MRI because she has a pacemaker.  No evidence of sepsis or infection thus far.  Urinalysis and chest x-ray did not show any evidence of infection.  Follow-up with neurologist for further recommendations.  Dysphagia and odynophagia: Gastroenterologist has been consulted for further evaluation.  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  Thyroid ultrasound has been ordered.  Hypothermia: Improved.  Etiology unclear.  No evidence of sepsis thus far.  TSH was 7.988  Paroxysmal atrial fibrillation and history of stroke: Continue Eliquis  Chronic diastolic CHF: Compensated.  Continue torsemide  Generalized weakness and fatigue: PT recommends discharge to SNF.  Plan of care was discussed with her daughter, Colleen Carson, at the bedside.  She had a list of questions in her notebook.  All her questions were answered to the best of my ability.   Diet Order            Diet NPO time specified  Diet effective now                     Consultants:  Neurologist  Gastroenterologist  Procedures:  None    Medications:   . apixaban  2.5 mg Oral BID  . atorvastatin  40 mg Oral QHS  . loratadine  10 mg Oral Daily  . multivitamin with minerals  1 tablet Oral Daily  . sodium chloride flush  3 mL Intravenous Q12H  . torsemide  20 mg Oral Daily   Continuous Infusions: . sodium chloride    . sodium chloride 30 mL/hr at 11/10/20 1558     Anti-infectives (From admission, onward)   None             Family Communication/Anticipated D/C date and plan/Code Status   DVT prophylaxis: apixaban (ELIQUIS) tablet 2.5 mg Start: 11/10/20 1345 apixaban (ELIQUIS) tablet 2.5 mg     Code Status: DNR  Family Communication: Colleen Carson, daughter, at bedside Disposition Plan:    Status is: Observation  The patient will require care spanning > 2 midnights and should be moved to inpatient because: Unsafe d/c plan  Dispo: The patient is from: Home              Anticipated d/c is to: SNF              Anticipated d/c date is: 2 days              Patient currently is not medically stable to d/c.   Difficult to place  patient No           Subjective:   Interval events noted.  She complains of pain on swallowing and pain in the neck.    Objective:    Vitals:   11/11/20 0444 11/11/20 0631 11/11/20 1053 11/11/20 1100  BP: 132/62 123/66 138/60   Pulse: 65 62 (!) 59   Resp: 18     Temp: (!) 94.3 F (34.6 C) (!) 95.2 F (35.1 C)  (!) 94.3 F (34.6 C)  TempSrc: Rectal Rectal  Oral  SpO2: 97% 98% 98%   Height:       No data found.   Intake/Output Summary (Last 24 hours) at 11/11/2020 1235 Last data filed at 11/11/2020 0127 Gross per 24 hour  Intake 0 ml  Output 0 ml  Net 0 ml   There were no vitals filed for this visit.  Exam:  GEN: NAD SKIN: No rash EYES: EOMI, PERRL ENT: MMM CV: RRR PULM: CTA B ABD: soft, ND, NT, +BS CNS: AAO x 2 (person and place), non focal EXT: No edema or  tenderness        Data Reviewed:   I have personally reviewed following labs and imaging studies:  Labs: Labs show the following:   Basic Metabolic Panel: Recent Labs  Lab 11/07/20 1542 11/08/20 0547 11/08/20 1339 11/10/20 0909 11/11/20 0424  NA 134* 135  --  137 138  K 5.0 5.3*   < > 3.2* 3.5  CL 100 101  --  95* 99  CO2 25 21*  --  26 25  GLUCOSE 101* 82  --  107* 89  BUN 46* 43*  --  35* 36*  CREATININE 1.78* 1.61*  --  1.43* 1.34*  CALCIUM 9.6 9.6  --  9.7 9.5  MG 2.4  --   --   --   --    < > = values in this interval not displayed.   GFR Estimated Creatinine Clearance: 22 mL/min (A) (by C-G formula based on SCr of 1.34 mg/dL (H)). Liver Function Tests: Recent Labs  Lab 11/07/20 1542 11/10/20 0909  AST 47* 53*  ALT 28 26  ALKPHOS 230* 226*  BILITOT 1.3* 1.4*  PROT 7.2 7.4  ALBUMIN 4.1 4.1   No results for input(s): LIPASE, AMYLASE in the last 168 hours. No results for input(s): AMMONIA in the last 168 hours. Coagulation profile No results for input(s): INR, PROTIME in the last 168 hours.  CBC: Recent Labs  Lab 11/07/20 1542 11/08/20 0547 11/10/20 0909 11/11/20 0424  WBC 4.4 4.9 6.8 4.4  NEUTROABS 2.5  --  3.9  --   HGB 13.1 13.0 13.5 12.6  HCT 40.9 38.7 41.1 37.6  MCV 90.7 87.8 88.4 86.6  PLT 121* 117* 115* 108*   Cardiac Enzymes: Recent Labs  Lab 11/10/20 0909  CKTOTAL 79   BNP (last 3 results) No results for input(s): PROBNP in the last 8760 hours. CBG: No results for input(s): GLUCAP in the last 168 hours. D-Dimer: No results for input(s): DDIMER in the last 72 hours. Hgb A1c: No results for input(s): HGBA1C in the last 72 hours. Lipid Profile: No results for input(s): CHOL, HDL, LDLCALC, TRIG, CHOLHDL, LDLDIRECT in the last 72 hours. Thyroid function studies: Recent Labs    11/10/20 0909  TSH 7.988*   Anemia work up: No results for input(s): VITAMINB12, FOLATE, FERRITIN, TIBC, IRON, RETICCTPCT in the last 72  hours. Sepsis Labs: Recent Labs  Lab 11/07/20 1542 11/08/20 0546  11/08/20 0547 11/10/20 0909 11/11/20 0424  PROCALCITON <0.10  --   --  <0.10  --   WBC 4.4  --  4.9 6.8 4.4  LATICACIDVEN 1.2 1.4  --   --   --     Microbiology Recent Results (from the past 240 hour(s))  Resp Panel by RT-PCR (Flu A&B, Covid) Nasopharyngeal Swab     Status: None   Collection Time: 11/07/20  3:42 PM   Specimen: Nasopharyngeal Swab; Nasopharyngeal(NP) swabs in vial transport medium  Result Value Ref Range Status   SARS Coronavirus 2 by RT PCR NEGATIVE NEGATIVE Final    Comment: (NOTE) SARS-CoV-2 target nucleic acids are NOT DETECTED.  The SARS-CoV-2 RNA is generally detectable in upper respiratory specimens during the acute phase of infection. The lowest concentration of SARS-CoV-2 viral copies this assay can detect is 138 copies/mL. A negative result does not preclude SARS-Cov-2 infection and should not be used as the sole basis for treatment or other patient management decisions. A negative result may occur with  improper specimen collection/handling, submission of specimen other than nasopharyngeal swab, presence of viral mutation(s) within the areas targeted by this assay, and inadequate number of viral copies(<138 copies/mL). A negative result must be combined with clinical observations, patient history, and epidemiological information. The expected result is Negative.  Fact Sheet for Patients:  BloggerCourse.comhttps://www.fda.gov/media/152166/download  Fact Sheet for Healthcare Providers:  SeriousBroker.ithttps://www.fda.gov/media/152162/download  This test is no t yet approved or cleared by the [place] FDA and  has been authorized for detection and/or diagnosis of SARS-CoV-2 by FDA under an Emergency Use Authorization (EUA). This EUA will remain  in effect (meaning this test can be used) for the duration of the COVID-19 declaration under Section 564(b)(1) of the Act, 21 U.S.C.section 360bbb-3(b)(1), unless  the authorization is terminated  or revoked sooner.       Influenza A by PCR NEGATIVE NEGATIVE Final   Influenza B by PCR NEGATIVE NEGATIVE Final    Comment: (NOTE) The Xpert Xpress SARS-CoV-2/FLU/RSV plus assay is intended as an aid in the diagnosis of influenza from Nasopharyngeal swab specimens and should not be used as a sole basis for treatment. Nasal washings and aspirates are unacceptable for Xpert Xpress SARS-CoV-2/FLU/RSV testing.  Fact Sheet for Patients: BloggerCourse.comhttps://www.fda.gov/media/152166/download  Fact Sheet for Healthcare Providers: SeriousBroker.ithttps://www.fda.gov/media/152162/download  This test is not yet approved or cleared by the [place] FDA and has been authorized for detection and/or diagnosis of SARS-CoV-2 by FDA under an Emergency Use Authorization (EUA). This EUA will remain in effect (meaning this test can be used) for the duration of the COVID-19 declaration under Section 564(b)(1) of the Act, 21 U.S.C. section 360bbb-3(b)(1), unless the authorization is terminated or revoked.  Performed at [facility], [address] [place], [place] [address]   Urine Culture     Status: None   Collection Time: 11/07/20  3:42 PM   Specimen: Urine, Random  Result Value Ref Range Status   Specimen Description   Final    URINE, RANDOM Performed at [facility], [address] [place], [place] [address]    Special Requests   Final    NONE Performed at [facility], [address] [place], [place] [address]    Culture   Final    NO GROWTH Performed at [facility], [address] [place], [place] [address]    Report Status 11/09/2020 FINAL  Final  Resp Panel by RT-PCR (Flu A&B, Covid) Urine, Catheterized  Status: None   Collection Time: 11/10/20  9:09 AM   Specimen: Urine, Catheterized; Nasopharyngeal(NP) swabs in vial transport medium  Result Value Ref Range Status   SARS Coronavirus 2 by RT PCR NEGATIVE NEGATIVE Final     Comment: (NOTE) SARS-CoV-2 target nucleic acids are NOT DETECTED.  The SARS-CoV-2 RNA is generally detectable in upper respiratory specimens during the acute phase of infection. The lowest concentration of SARS-CoV-2 viral copies this assay can detect is 138 copies/mL. A negative result does not preclude SARS-Cov-2 infection and should not be used as the sole basis for treatment or other patient management decisions. A negative result may occur with  improper specimen collection/handling, submission of specimen other than nasopharyngeal swab, presence of viral mutation(s) within the areas targeted by this assay, and inadequate number of viral copies(<138 copies/mL). A negative result must be combined with clinical observations, patient history, and epidemiological information. The expected result is Negative.  Fact Sheet for Patients:  BloggerCourse.com  Fact Sheet for Healthcare Providers:  SeriousBroker.it  This test is no t yet approved or cleared by the [place] FDA and  has been authorized for detection and/or diagnosis of SARS-CoV-2 by FDA under an Emergency Use Authorization (EUA). This EUA will remain  in effect (meaning this test can be used) for the duration of the COVID-19 declaration under Section 564(b)(1) of the Act, 21 U.S.C.section 360bbb-3(b)(1), unless the authorization is terminated  or revoked sooner.       Influenza A by PCR NEGATIVE NEGATIVE Final   Influenza B by PCR NEGATIVE NEGATIVE Final    Comment: (NOTE) The Xpert Xpress SARS-CoV-2/FLU/RSV plus assay is intended as an aid in the diagnosis of influenza from Nasopharyngeal swab specimens and should not be used as a sole basis for treatment. Nasal washings and aspirates are unacceptable for Xpert Xpress SARS-CoV-2/FLU/RSV testing.  Fact Sheet for Patients: BloggerCourse.com  Fact Sheet for Healthcare  Providers: SeriousBroker.it  This test is not yet approved or cleared by the [place] FDA and has been authorized for detection and/or diagnosis of SARS-CoV-2 by FDA under an Emergency Use Authorization (EUA). This EUA will remain in effect (meaning this test can be used) for the duration of the COVID-19 declaration under Section 564(b)(1) of the Act, 21 U.S.C. section 360bbb-3(b)(1), unless the authorization is terminated or revoked.  Performed at [facility], [address] [place], [place] [address]     Procedures and diagnostic studies:  CT ABDOMEN PELVIS WO CONTRAST  Result Date: 11/10/2020 CLINICAL DATA:  87 year old female with abdominal pain, dysuria and altered mental status. EXAM: CT ABDOMEN AND PELVIS WITHOUT CONTRAST TECHNIQUE: Multidetector CT imaging of the abdomen and pelvis was performed following the standard protocol without IV contrast. COMPARISON:  08/09/2019 chest CT. FINDINGS: Please note that parenchymal abnormalities may be missed without intravenous contrast. Lower chest: Cardiomegaly, trace bilateral pleural effusion and pacemaker leads are present. Mild bibasilar atelectasis is noted. Hepatobiliary: No definite hepatic abnormality noted. No gallbladder abnormalities are identified. No biliary dilatation. Pancreas: Unremarkable Spleen: Unremarkable Adrenals/Urinary Tract: Renal cortical thinning noted. Multiple lesions within both kidneys probably represent cysts. There is no evidence of hydronephrosis or obstructing urinary calculi. Renal vascular calcifications are present. The adrenal glands are unremarkable. The bladder is not well distended. Stomach/Bowel: There is no evidence of bowel obstruction or pneumoperitoneum. Colonic diverticulosis noted without definite evidence of acute diverticulitis. No suspicious bowel wall thickening is noted. Vascular/Lymphatic: Aortic atherosclerosis. No enlarged abdominal or pelvic lymph  nodes. Reproductive: No significant abnormality  identified but LEFT hip replacement artifact obscures detail within the pelvis. Other: There may be a trace amount of free fluid within the posterior LOWER pelvis. Musculoskeletal: LEFT hip arthroplasty changes noted. Degenerative changes within the lumbar spine are present. IMPRESSION: 1. Question trace free fluid within the pelvis of uncertain etiology. 2. Cardiomegaly, trace bilateral pleural effusions and mild bibasilar atelectasis. 3. Colonic diverticulosis without definite evidence of acute diverticulitis. 4. Aortic Atherosclerosis (ICD10-I70.0). Electronically Signed   By: Harmon Pier M.D.   On: 11/10/2020 09:59   DG Chest 1 View  Result Date: 11/10/2020 CLINICAL DATA:  87 year old female with altered mental status EXAM: CHEST  1 VIEW COMPARISON:  11/07/2020 FINDINGS: Cardiomediastinal silhouette unchanged. Pacing device on the left chest wall with 2 leads in place. Calcifications of the aortic arch. No pneumothorax. Similar appearance at the left lung base with blunting at the left costophrenic angle. Coarsened interstitial markings throughout with no interlobular septal thickening. No confluent airspace disease. IMPRESSION: Similar appearance of the chest x-ray with likely scarring at the left lung base and no evidence of acute cardiopulmonary disease. Unchanged left chest wall pacing device and cardiomegaly. Electronically Signed   By: Gilmer Mor D.O.   On: 11/10/2020 09:55   CT Head Wo Contrast  Result Date: 11/10/2020 CLINICAL DATA:  Mental status change with unknown cause EXAM: CT HEAD WITHOUT CONTRAST TECHNIQUE: Contiguous axial images were obtained from the base of the skull through the vertex without intravenous contrast. COMPARISON:  06/21/2019 FINDINGS: Brain: No evidence of acute infarction, hemorrhage, hydrocephalus, extra-axial collection or mass lesion/mass effect. Remote bilateral occipital infarct at the poles. Vascular: Atheromatous  calcification. Skull: Normal. Negative for fracture or focal lesion. Sinuses/Orbits: No acute finding. IMPRESSION: No acute or reversible finding.  Stable from 2020. Electronically Signed   By: Marnee Spring M.D.   On: 11/10/2020 09:42               LOS: 0 days   Antwione Picotte  [facility]   Pager on [email or web address]. If 7PM-7AM, please contact night-coverage at [email or web address]     11/11/2020, 12:35 PM

## 2020-11-11 NOTE — Progress Notes (Signed)
Mobility Specialist - Progress Note   11/11/20 1500  Mobility  Activity Stood at bedside  Range of Motion/Exercises Right leg;Left leg (AP, ABD, SLR)  Level of Assistance Moderate assist, patient does 50-74%  Assistive Device Front wheel walker  Distance Ambulated (ft) 0 ft  Mobility Response Tolerated well  Mobility performed by Mobility specialist  $Mobility charge 1 Mobility    Pre-mobility: 62 HR, 94% SpO2 During mobility: 71 HR, 98% SpO2 Post-mobility: 60 HR, 95% SpO2   Pt was lying in bed upon arrival with daughter present in room. Pt agreed to session. Pt utilizing room air. Pt c/o fatigue, but denies pain and nausea. Pt appears confused this date and required redirection several times during session. Pt was able to sit EOB with minA where she began c/o lightheadedness. An extensive rest break was taken to resolve issue. While seated EOB, pt participated in exercises: ankle pumps x10, straight leg raises x10, and abduction x10 with minA. Verbal and tactile cues needed for proper technique and arousal as pt would occasionally close eyes resulting in minimized participation. Pt stood to RW x2 with modA +1 on first attempt, progressing to minA +1 on 2nd attempt. Pt with posterior lean upon standing that was temporarily corrected with cueing until knees began to buckle. Blocking method used to prevent foot from sliding. Pt able to hold upright position for ~8 seconds and then ~5 seconds before requesting to sit. VC needed for hand placement and sequencing. Overall, pt tolerated session well. Pt was left in bed with all needs in reach and alarm set.    Filiberto Pinks Mobility Specialist 11/11/20, 4:09 PM

## 2020-11-11 NOTE — Progress Notes (Signed)
Received an order for a warming blanket but there are none currently available for use. Will continue to apply warm blankets. NP aware.

## 2020-11-11 NOTE — Progress Notes (Signed)
NEUROLOGY CONSULTATION PROGRESS NOTE   Date of service: November 11, 2020 Patient Name: Colleen Carson MRN:  [id number] DOB:  [birth year]  Brief HPI    Colleen Carson is a 87 y.o. female  with PMH significant for arthritis, Afibb on Eliquis, CHF, HLD, HTN, shingles, hx of strokes who presents with lethargy, tiredness and waxing and waning mentation. This is her 4th visit to a hospital in the last 2 weeks. Initial concern for a UTI was on Abx and then discontinued. Subsequent concern for dehydration and overdiuresis with improvement with gentle hydration and then discharged. She is being brought back in this time for waxing and waning mentation.    Interval Hx   Much more awake and interactive. Answers questions, follows commands. Wants to watch some MS[employer] today. She likes watching 6 o clock news. Reports that she slept well last night.  Vitals   Vitals:   11/11/20 0444 11/11/20 0631 11/11/20 1053 11/11/20 1100  BP: 132/62 123/66 138/60   Pulse: 65 62 (!) 59   Resp: 18     Temp: (!) 94.3 F (34.6 C) (!) 95.2 F (35.1 C)  (!) 94.3 F (34.6 C)  TempSrc: Rectal Rectal  Oral  SpO2: 97% 98% 98%   Height:         Body mass index is 25.41 kg/m.  Physical Exam   General: Laying comfortably in bed; in no acute distress. HENT: Normal oropharynx and mucosa. Normal external appearance of ears and nose. Neck: Supple, no pain or tenderness CV: No JVD. No peripheral edema. Pulmonary: Symmetric Chest rise. Normal respiratory effort. Abdomen: Soft to touch, non-tender. Ext: No cyanosis, edema, or deformity Skin: No rash. Normal palpation of skin.  Musculoskeletal: Normal digits and nails by inspection. No clubbing.  Neurologic Examination  Mental status/Cognition: Alert, oriented to self, place, month and year, good attention. Able to do simple calculations. Speech/language: Fluent, comprehension intact, object naming intact, repetition intact. Cranial nerves:   CN II Pupils equal and  reactive to light, no VF deficits   CN III,IV,VI EOM intact, no gaze preference or deviation, no nystagmus   CN V normal sensation in V1, V2, and V3 segments bilaterally   CN VII no asymmetry, no nasolabial fold flattening   CN VIII normal hearing to speech   CN IX & X normal palatal elevation, no uvular deviation   CN XI 5/5 head turn and 5/5 shoulder shrug bilaterally   CN XII midline tongue protrusion   Motor:  Muscle bulk: poor, tone normal, pronator drift none tremor none Mvmt Root Nerve  Muscle Right Left Comments  SA C5/6 Ax Deltoid     EF C5/6 Mc Biceps 5 5   EE C6/7/8 Rad Triceps 5 5   WF C6/7 Med FCR     WE C7/8 PIN ECU     F Ab C8/T1 U ADM/FDI 5 5   HF L1/2/3 Fem Illopsoas 4 4   KE L2/3/4 Fem Quad     DF L4/5 D Peron Tib Ant 5 5   PF S1/2 Tibial Grc/Sol 5 5    Reflexes:  Right Left Comments  Pectoralis      Biceps (C5/6) 1 1   Brachioradialis (C5/6) 1 1    Triceps (C6/7) 1 1    Patellar (L3/4) 2 2    Achilles (S1) 1 1    Hoffman      Plantar     Jaw jerk    Sensation:  Light touch Intact throughout  Pin prick    Temperature    Vibration   Proprioception    Coordination/Complex Motor:  - Finger to Nose intact BL - Rapid alternating movement are slowed - Gait: Deferred.  Labs   Basic Metabolic Panel:  Lab Results  Component Value Date   NA 138 11/11/2020   K 3.5 11/11/2020   CO2 25 11/11/2020   GLUCOSE 89 11/11/2020   BUN 36 (H) 11/11/2020   CREATININE 1.34 (H) 11/11/2020   CALCIUM 9.5 11/11/2020   GFRNONAA 37 (L) 11/11/2020   GFRAA 45 (L) 04/09/2020   HbA1c:  Lab Results  Component Value Date   HGBA1C 5.5 01/16/2018   LDL:  Lab Results  Component Value Date   LDLCALC 63 01/16/2018   Urine Drug Screen: No results found for: LABOPIA, COCAINSCRNUR, LABBENZ, AMPHETMU, THCU, LABBARB  Alcohol Level No results found for: ETH No results found for: PHENYTOIN, ZONISAMIDE, LAMOTRIGINE, LEVETIRACETA No results found for: PHENYTOIN, PHENOBARB,  VALPROATE, CBMZ  Imaging and Diagnostic studies   CT HEAD WO CONTRAST CTH was negative for a large hypodensity concerning for a large territory infarct or hyperdensity concerning for an ICH  Impression   Colleen Carson is a 87 y.o. female with PMH significant for arthritis, Afibb on Eliquis, CHF, HLD, HTN, shingles, hx of strokes who presents with lethargy, tiredness and waxing and waning mentation. She has been in and out of hospital over the last few days. Her mentation and attention is much more improved after she slept well last night. In addition to being awake, alert, talking, following commands, she is able to do simple calculations. Repeat CTH without contrast is negative for an acute stroke, no ICH.  She was hypothermic last night thou with a Tmin of 91.8, improve with warming blankets. Not sure what to make of this. Might be related to her elevated TSH but the elevation is not that significant. I still am not very concerned about a CNS infection as she improved spontaneously and is doing much better today without any antimicrobial agents. If this was a CNS infection, I would expect this to actually worsen.  My concern is that her delirium might worsen if she is stays in the hospital for long. We should maintain delirium precautions and see if it is safe to minimize vital checks overnight to promote restful sleep.  Recommendations  - I cancelled rEEG. The event of shaking that I witnessed and documented in my initial consult note was clearly not a seizure. She was able to follow commands with both her legs shaking arhyhtmically. - We do not have the ability to do MRI Brain in patients with pacemaker. I do not think that she needs an MRI now that she is improved. - Neurology inpatient team will signoff. Please feel free to contact us with any questions or concerns. ______________________________________________________________________   Thank you for the opportunity to take part in the  care of this patient. If you have any further questions, please contact the neurology consultation attending.  Signed,  Erick Blinks [facility] Pager Number [phone]

## 2020-11-11 NOTE — TOC Progression Note (Addendum)
Transition of Care [facility]) - Progression Note    Patient Details  Name: Colleen Carson MRN: [id number] Date of Birth: May 23, 1925  [facility]) CM/SW Contact  Bing Quarry, RN Phone Number: 11/11/2020, 12:42 PM  Clinical Narrative:  11/11/20 Patient in OBS.   [facility] set up in ED by SW.   Patient recently discharged on 2/17 for UTI, returned with weakness, difficulty with words, arms shaking, urgency, burning.   Multiple co-morbidities including CHF/Pacemaker/anticoagulants/stroke.  Bear hugger for low temp on admission.   ED SW set up [facility] via [facility] but PT evaluation rec SNF per PT notes.   Notes also indicate daughter in agreement.   Messaged provider re OBS status.  Gabriel Cirri RN CM  [phone] Provider indicates patient will be changed to inpatient status. Gabriel Cirri RN CM           Expected Discharge Plan and Services                                                 Social Determinants of Health (SDOH) Interventions    Readmission Risk Interventions No flowsheet data found.

## 2020-11-12 ENCOUNTER — Inpatient Hospital Stay: Payer: Medicare Other

## 2020-11-12 ENCOUNTER — Ambulatory Visit: Payer: Medicare Other | Admitting: Dermatology

## 2020-11-12 DIAGNOSIS — I480 Paroxysmal atrial fibrillation: Secondary | ICD-10-CM | POA: Diagnosis not present

## 2020-11-12 DIAGNOSIS — R410 Disorientation, unspecified: Secondary | ICD-10-CM | POA: Diagnosis not present

## 2020-11-12 DIAGNOSIS — I5032 Chronic diastolic (congestive) heart failure: Secondary | ICD-10-CM | POA: Diagnosis not present

## 2020-11-12 NOTE — TOC Initial Note (Signed)
Transition of Care [facility]) - Initial/Assessment Note    Patient Details  Name: Colleen Carson MRN: [id number] Date of Birth: 1925/05/12  Transition of Care [facility]) CM/SW Contact:    Chapman Fitch, RN Phone Number: 11/12/2020, 3:30 PM  Clinical Narrative:                 Patient was recently discharged from [facility] Patient lives at home with daughter and son in law and is normally relatively independent at baseline. Previous admission patient received a RW and BSC.  Was set up with home health services through [facility] at that time  PT and OT have seen patient this morning and are both in agreement that patient can return home with home health services.  Spoke with daughter she is in agreement.  She requests for MD to call for an update.  I have notified MD via secure chat  Expected Discharge Plan: Home w Home Health Services Barriers to Discharge: Continued Medical Work up   Patient Goals and CMS Choice        Expected Discharge Plan and Services Expected Discharge Plan: Home w Home Health Services                                              Prior Living Arrangements/Services   Lives with:: Adult Children              Current home services: DME    Activities of Daily Living Home Assistive Devices/Equipment: None ADL Screening (condition at time of admission) Patient's cognitive ability adequate to safely complete daily activities?: No Is the patient deaf or have difficulty hearing?: Yes Does the patient have difficulty seeing, even when wearing glasses/contacts?: Yes Does the patient have difficulty concentrating, remembering, or making decisions?: Yes Patient able to express need for assistance with ADLs?: No Does the patient have difficulty dressing or bathing?: No Independently performs ADLs?: Yes (appropriate for developmental age) Does the patient have difficulty walking or climbing stairs?: No Weakness of Legs: Both Weakness of  Arms/Hands: Both  Permission Sought/Granted                  Emotional Assessment              Admission diagnosis:  Hypokalemia [E87.6] Generalized pain [R52] Generalized weakness [R53.1] TSH elevation [R79.89] Delirium [R41.0] Patient Active Problem List   Diagnosis Date Noted  . Delirium 11/11/2020  . Generalized weakness 11/10/2020  . Weakness   . Thrombocytopenia (HCC)   . Chronic anticoagulation   . Acute on chronic diastolic heart failure (HCC) 11/29/2019  . Chronic diastolic heart failure (HCC) 05/17/2018  . Chronic systolic heart failure (HCC) 03/04/2017  . Anxiety 03/04/2017  . TIA (transient ischemic attack) 12/24/2016  . Acute encephalopathy 10/01/2016  . Symptomatic bradycardia 05/15/2016  . HTN (hypertension) 05/15/2016  . HLD (hyperlipidemia) 05/15/2016  . Atrial fibrillation (HCC) 03/11/2016  . AKI (acute kidney injury) (HCC) 05/06/2015  . S/P total hip arthroplasty 01/25/2015  . Primary osteoarthritis of left hip 01/23/2015   PCP:  Kandyce Rud, MD Pharmacy:   [facility] [address], [place] - [address] [place] [address] Phone: [phone] Fax: [phone]  [id number] - [place], [place] - [address] [place] [address] Phone: [phone] Fax: [phone]     Social Determinants  of Health (SDOH) Interventions    Readmission Risk Interventions No flowsheet data found.  

## 2020-11-12 NOTE — Progress Notes (Signed)
[facility]enterology Inpatient Progress Note  Subjective: Patient seen for f/u dysphagia. Patient more alert today and denies any dysphagia. BS xray revealed tertiary contractions of the esophagus with no identifiable stricture or mass.  Objective: Vital signs in last 24 hours: Temp:  [95.5 F (35.3 C)-97.8 F (36.6 C)] 97.8 F (36.6 C) (02/21 1118) Pulse Rate:  [59-80] 80 (02/21 1118) Resp:  [16-20] 16 (02/21 1118) BP: (127-148)/(62-73) 127/65 (02/21 1118) SpO2:  [97 %-100 %] 97 % (02/21 1118) Blood pressure 127/65, pulse 80, temperature 97.8 F (36.6 C), resp. rate 16, height 5\' 1"  (1.549 m), SpO2 97 %.    Intake/Output from previous day: 02/20 0701 - 02/21 0700 In: 961 [P.O.:240; I.V.:721] Out: 750 [Urine:750]  Intake/Output this shift: Total I/O In: -  Out: 250 [Urine:250]   General appearance: Alert, NAD Resp: CTA Cardio:  RR GI:  Soft, benign. BS+ Extremities: No edema.   Lab Results: No results found for this or any previous visit (from the past 24 hour(s)).   Recent Labs    11/10/20 0909 11/11/20 0424  WBC 6.8 4.4  HGB 13.5 12.6  HCT 41.1 37.6  PLT 115* 108*   BMET Recent Labs    11/10/20 0909 11/11/20 0424  NA 137 138  K 3.2* 3.5  CL 95* 99  CO2 26 25  GLUCOSE 107* 89  BUN 35* 36*  CREATININE 1.43* 1.34*  CALCIUM 9.7 9.5   LFT Recent Labs    11/10/20 0909  PROT 7.4  ALBUMIN 4.1  AST 53*  ALT 26  ALKPHOS 226*  BILITOT 1.4*   PT/INR No results for input(s): LABPROT, INR in the last 72 hours. Hepatitis Panel No results for input(s): HEPBSAG, HCVAB, HEPAIGM, HEPBIGM in the last 72 hours. C-Diff No results for input(s): CDIFFTOX in the last 72 hours. No results for input(s): CDIFFPCR in the last 72 hours.   Studies/Results: CT HEAD WO CONTRAST  Result Date: 11/11/2020 CLINICAL DATA:  Confusion. EXAM: CT HEAD WITHOUT CONTRAST TECHNIQUE: Contiguous axial images were obtained from the base of the skull through the  vertex without intravenous contrast. COMPARISON:  Head CT 11/10/2020. FINDINGS: Brain: No evidence of acute infarction, hemorrhage, hydrocephalus, extra-axial collection or mass lesion/mass effect. Remote bilateral PCA infarcts again seen. Vascular: No hyperdense vessel or unexpected calcification. Skull: Intact.  No focal lesion. Sinuses/Orbits: Negative. Other: None. IMPRESSION: No acute abnormality or change compared to yesterday's exam. Remote bilateral PCA infarcts. Electronically Signed   By: 11/12/2020 M.D.   On: 11/11/2020 13:54   11/13/2020 THYROID  Result Date: 11/12/2020 CLINICAL DATA:  Odynophagia EXAM: THYROID ULTRASOUND TECHNIQUE: Ultrasound examination of the thyroid gland and adjacent soft tissues was performed. COMPARISON:  None. FINDINGS: Parenchymal Echotexture: Mildly heterogenous Isthmus: 0.3 cm thickness Right lobe: 4.4 x 3.1 x 2.1 cm Left lobe: 3.8 x 2 x 2.4 cm _________________________________________________________ Estimated total number of nodules >/= 1 cm: 3 Number of spongiform nodules >/=  2 cm not described below (TR1): 0 Number of mixed cystic and solid nodules >/= 1.5 cm not described below (TR2): 0 _________________________________________________________ Nodule # 1: 0.9 cm hypoechoic nodule without calcifications, mid right; This nodule does NOT meet TI-RADS criteria for biopsy or dedicated follow-up. Nodule # 2: 0.7 cm benign colloid cyst, mid right Nodule # 3: Location: Right; Inferior Maximum size: 1.4 cm; Other 2 dimensions: 1.2 x 1.2 cm Composition: mixed cystic and solid (1) Echogenicity: hypoechoic (2) Shape: not taller-than-wide (0) Margins: ill-defined (0) Echogenic foci: none (0) ACR TI-RADS  total points: 3. ACR TI-RADS risk category: TR3 (3 points). ACR TI-RADS recommendations: Given size (<1.4 cm) and appearance, this nodule does NOT meet TI-RADS criteria for biopsy or dedicated follow-up. _________________________________________________________ Nodule # 4: Location:  Right; Inferior Maximum size: 2.2 cm; Other 2 dimensions: 1.7 x 1.7 cm Composition: solid/almost completely solid (2) Echogenicity: hypoechoic (2) Shape: not taller-than-wide (0) Margins: smooth (0) Echogenic foci: none (0) ACR TI-RADS total points: 4. ACR TI-RADS risk category: TR4 (4-6 points). ACR TI-RADS recommendations: **Given size (>/= 1.5 cm) and appearance, fine needle aspiration of this moderately suspicious nodule should be considered based on TI-RADS criteria. _________________________________________________________ Nodule # 5: Location: Left; Superior Maximum size: 1.4 cm; Other 2 dimensions: 1.4 x 1.1 cm Composition: solid/almost completely solid (2) Echogenicity: hypoechoic (2) Shape: not taller-than-wide (0) Margins: smooth (0) Echogenic foci: none (0) ACR TI-RADS total points: 4. ACR TI-RADS risk category: TR4 (4-6 points). ACR TI-RADS recommendations: *Given size (>/= 1 - 1.4 cm) and appearance, a follow-up ultrasound in 1 year should be considered based on TI-RADS criteria. _________________________________________________________ Nodule # 6: 0.7 cm hypoechoic nodule without calcifications, mid left; This nodule does NOT meet TI-RADS criteria for biopsy or dedicated follow-up. IMPRESSION: 1. Normal-sized thyroid with bilateral nodules. Recommend FNA biopsy of moderately suspicious 2.2 cm inferior right nodule. 2. Recommend annual/biennial ultrasound follow-up of additional nodules as above, until stability x5 years confirmed. The above is in keeping with the ACR TI-RADS recommendations - J Am Coll Radiol 2017;14:587-595. Electronically Signed   By: Corlis Leak M.D.   On: 11/12/2020 07:41   DG ESOPHAGUS W SINGLE CM (SOL OR THIN BA)  Result Date: 11/12/2020 CLINICAL DATA:  Dysphagia EXAM: ESOPHOGRAM/BARIUM SWALLOW TECHNIQUE: Single contrast examination was performed using thin barium or water soluble. FLUOROSCOPY TIME:  Fluoroscopy Time:  0.8 minute Radiation Exposure Index (if provided by the  fluoroscopic device): 4.2 mGy Number of Acquired Spot Images: 0 COMPARISON:  None. FINDINGS: Normal pharyngeal anatomy and motility. Small volume silent tracheal aspiration. Contrast flowed freely through the esophagus without evidence of a stricture or mass. Normal esophageal mucosa without evidence of irregularity or ulceration. Tertiary contractions of the distal half of the esophagus as can be seen with esophageal spasm. No evidence of reflux. No definite hiatal hernia was demonstrated. IMPRESSION: Tertiary contractions of the distal half of the esophagus as can be seen with esophageal spasm. No esophageal stricture. Small volume silent tracheal aspiration. Electronically Signed   By: Elige Ko   On: 11/12/2020 16:04    Scheduled Inpatient Medications:   . apixaban  2.5 mg Oral BID  . atorvastatin  40 mg Oral QHS  . loratadine  10 mg Oral Daily  . multivitamin with minerals  1 tablet Oral Daily  . sodium chloride flush  3 mL Intravenous Q12H  . torsemide  20 mg Oral Daily    Continuous Inpatient Infusions:   . sodium chloride      PRN Inpatient Medications:  sodium chloride, acetaminophen **OR** acetaminophen, ondansetron **OR** ondansetron (ZOFRAN) IV, phenol, sodium chloride flush   Assessment:  1. Dysphagia - Probably related to esophageal dysmotility. No evidence of oral thrush on PE. No esophageal narrowing or obstruction.  Plan:  1. Advance diet. 2. No indication at present for EGD. 3. GI sign off. Call back if needed.   Kambrea Carrasco K. Norma Fredrickson, M.D. 11/12/2020, 4:17 PM

## 2020-11-12 NOTE — Evaluation (Signed)
Occupational Therapy Evaluation Patient Details Name: Colleen Carson MRN: [id number] DOB: 06-16-1925 Today's Date: 11/12/2020    History of Present Illness Patient is a 87 year old female who presents to the ER via EMS for evaluation of pain with swallowing as well as generalized weakness and confusion.  Patient was recently discharged from the hospital and according to the daughter has declined since her discharge and is very concerned about her mental status changes.  She is also concerned that patient is very weak and unable to ambulate. PMH: a-fib, HTN, CHF, hx of CVA, dyslipidemia, arthritis, lymphademia (LE), s/p THA, shingles   Clinical Impression   Patient presenting with decreased I in self care, balance, functional mobility/transfer, endurance, and safety awareness.  Patient lives with daughter and son in law at mod I level PTA. Pt did not use any AD until several weeks ago and has been progressively getting weaker since. At baseline, pt is very active, cooks at home, and engages in IADL tasks. Recently, pt got rollator for home use.Patient currently functioning at supervision - min guard with use of RW for toileting needs, functional transfers, and standing balance tasks related to self care. Patient will benefit from acute OT to increase overall independence in the areas of ADLs, functional mobility, and safety awareness in order to safely discharge home with family.    Follow Up Recommendations  Home health OT;Supervision - Intermittent    Equipment Recommendations  None recommended by OT       Precautions / Restrictions Precautions Precautions: Fall Restrictions Weight Bearing Restrictions: No      Mobility Bed Mobility Overal bed mobility: Modified Independent Bed Mobility: Supine to Sit     Supine to sit: Supervision;HOB elevated     General bed mobility comments: Pt seated in recliner chair    Transfers Overall transfer level: Needs assistance Equipment used:  Rolling walker (2 wheeled) Transfers: Stand Pivot Transfers;Sit to/from Stand Sit to Stand: Supervision Stand pivot transfers: Min assist       General transfer comment: cuing for hand placement    Balance Overall balance assessment: Needs assistance Sitting-balance support: Feet supported;Bilateral upper extremity supported Sitting balance-Leahy Scale: Good Sitting balance - Comments: supervision sitting EOB   Standing balance support: Bilateral upper extremity supported;During functional activity Standing balance-Leahy Scale: Fair Standing balance comment: UE support on RW during gait                           ADL either performed or assessed with clinical judgement   ADL Overall ADL's : Needs assistance/impaired     Grooming: Wash/dry hands;Wash/dry face;Oral care;Standing;Supervision/safety                   Toilet Transfer: Designer, multimedia and Hygiene: Min guard;Sit to/from stand       Functional mobility during ADLs: Min guard;Supervision/safety;Rolling walker       Vision Baseline Vision/History: No visual deficits Patient Visual Report: No change from baseline              Pertinent Vitals/Pain Pain Assessment: No/denies pain Pain Score: 0-No pain     Hand Dominance Right   Extremity/Trunk Assessment Upper Extremity Assessment Upper Extremity Assessment: Generalized weakness   Lower Extremity Assessment Lower Extremity Assessment: Generalized weakness       Communication Communication Communication: No difficulties   Cognition Arousal/Alertness: Awake/alert Behavior During Therapy: WFL for tasks assessed/performed Overall Cognitive Status: No family/caregiver present to  determine baseline cognitive functioning Area of Impairment: Orientation;Attention;Memory;Safety/judgement;Following commands;Awareness;Problem solving                 Orientation Level: Disoriented to;Situation    Memory: Decreased recall of precautions;Decreased short-term memory Following Commands: Follows one step commands consistently Safety/Judgement: Decreased awareness of safety;Decreased awareness of deficits Awareness: Intellectual Problem Solving: Slow processing;Decreased initiation General Comments: Pt much more alert & pt axo x 3 during this session!   General Comments  HR 73 bpm at rest at beginning & end of session    Exercises General Exercises - Lower Extremity Ankle Circles/Pumps: AROM;Strengthening;Both;10 reps;Seated Long Arc Quad: AROM;Strengthening;Both;10 reps;Seated Hip Flexion/Marching: AROM;Strengthening;Both;10 reps;Seated        Home Living Family/patient expects to be discharged to:: Private residence Living Arrangements: Children Available Help at Discharge: Family;Available PRN/intermittently Type of Home: House Home Access: Stairs to enter [employer] of Steps: 3 Entrance Stairs-Rails: Can reach both Home Layout: Two level;Able to live on main level with bedroom/bathroom     Bathroom Shower/Tub: Producer, television/film/video: Standard     Home Equipment: Environmental consultant - 4 wheels;Shower seat - built in;Grab bars - tub/shower;Walker - 2 wheels   Additional Comments: Daughter works part time and son in Social worker works from home      Prior Functioning/Environment Level of Independence: Independent        Comments: Prior to initial admission pt was independent without AD in the home, used rollator for longer/outdoor distances. Cooking, dressing without assistance. Pt with slow functional decline with daughter reporting pt has hardly been out of bed since returning home following previous admission.        OT Problem List: Decreased strength;Decreased activity tolerance;Impaired balance (sitting and/or standing);Decreased cognition;Impaired vision/perception;Decreased knowledge of use of DME or AE;Decreased safety awareness      OT  Treatment/Interventions: Self-care/ADL training;Therapeutic exercise;Energy conservation;DME and/or AE instruction;Cognitive remediation/compensation;Therapeutic activities;Balance training;Patient/family education;Manual therapy    OT Goals(Current goals can be found in the care plan section) Acute Rehab OT Goals Patient Stated Goal: return to PLOF OT Goal Formulation: With patient/family Time For Goal Achievement: 11/26/20 Potential to Achieve Goals: Good  OT Frequency: Min 2X/week    AM-PAC OT "6 Clicks" Daily Activity     Outcome Measure Help from another person eating meals?: None Help from another person taking care of personal grooming?: A Little Help from another person toileting, which includes using toliet, bedpan, or urinal?: A Little Help from another person bathing (including washing, rinsing, drying)?: A Little Help from another person to put on and taking off regular upper body clothing?: None Help from another person to put on and taking off regular lower body clothing?: A Little 6 Click Score: 20   End of Session Equipment Utilized During Treatment: Rolling walker Nurse Communication: Mobility status;Other (comment) (L UE edema noted - notified MD for possible doppler)  Activity Tolerance: Patient tolerated treatment well Patient left: with family/visitor present;in chair;with chair alarm set  OT Visit Diagnosis: Other abnormalities of gait and mobility (R26.89)                Time: 3016-0109 OT Time Calculation (min): 30 min Charges:  OT General Charges $OT Visit: 1 Visit OT Evaluation $OT Eval Low Complexity: 1 Low OT Treatments $Self Care/Home Management : 23-37 mins  Jackquline Denmark, MS, OTR/L , CBIS ascom [phone]  11/12/20, 12:46 PM

## 2020-11-12 NOTE — Progress Notes (Addendum)
Speech Language Pathology Treatment: Dysphagia  Patient Details Name: Colleen Carson MRN: [id number] DOB: [birth year] Today's Date: 11/12/2020 Time: 9675-9163 SLP Time Calculation (min) (ACUTE ONLY): 40 min  Assessment / Plan / Recommendation Clinical Impression  Pt seen for ongoing assessment of swallowing and toleration of diet; education re: swallowing, Esophageal dysmotility and impact on swallowing. Further education was given on diet consistency rec'd; general aspiration and REFLUX precautions. Daughter present.  Pt has a DG Esophagus Barium study completed today which revealed: "Tertiary contractions of the distal half of the esophagus as can be seen with esophageal spasm; no stricture; small volume silent tracheal aspiration.".  With this kind of Esophageal presentation, pt is at risk for aspiration of RETROGRADE bolus activity and REFLUX behavior Backflowing into the pharynx thus impacting pulmonary status as well as the impact from the min tracheal aspiration - suspect related to Baseline Esophageal phase impacting the Pharyngeal phase and timing of the swallow. Pt's CXR presentations in the last 2 visits(2/16/, 2/19) revealed: "Chronic interstitial scarring, without acute airspace disease". Pt is also of advanced age and at risk for delay in pharyngeal swallow initiation.  The Esophageal phase Dysmotility and it's impact on swallowing was discussed w/ pt and Daughter; Esophageal motility strategies; general aspiration precautions including NO STRAWS - Cup use for better oropharyngeal control of liquid boluses. Discussed the benefit of the Cut/Chopped foods moistened well for easier oral intake and Esophageal clearing. Rec'd gravies to moisten foods; soups. Dtr and pt agreed. Pt consumed sips of thin liquids w/ no overt, clinical s/s of aspiration noted w/ the po trials -- vocal quality remained clear and No decline in respiratory status noted. Discussed general aspiration precautions including  need to sit fully upright w/ all oral intake; Pills in Puree for safer swallowing; No Straws; reduce distractions during meals/oral intake -- all strategies to reduce risk for aspiration of liquids/foods. Daughter agreed. Handouts given on precautions..  Recommend upgrade to a Regular diet w/ more of a mech soft consistency for meats/foods w/ gravies to moisten; Thin liquids VIA CUP; aspiration precautions; REFLUX precautions; Pill Whole in Puree. Tray setup support. ST services will f/u w/ pt/Dtr tomorrow for any further questions. ST services will be available for further education as needed while admitted; any further assessment. NSG to recontact if new needs arise while admitted. Daughter agreed.     HPI HPI: Pt is 87 y.o. female who presented with weakness, decreased PO intake, weight gain and admitted for management of dehydration, over-diuresis.      SLP Plan  All goals met       Recommendations  Diet recommendations: Regular;Thin liquid (chopped meats, moistened foods - avoid particulates) Liquids provided via: Cup;No straw Medication Administration: Whole meds with puree (for safer swallowing) Supervision: Patient able to self feed;Intermittent supervision to cue for compensatory strategies (setup) Compensations: Minimize environmental distractions;Slow rate;Small sips/bites;Lingual sweep for clearance of pocketing;Follow solids with liquid Postural Changes and/or Swallow Maneuvers: Seated upright 90 degrees;Upright 30-60 min after meal;Out of bed for meals                General recommendations:  (Dietician f/u) Oral Care Recommendations: Oral care BID;Oral care before and after PO;Staff/trained caregiver to provide oral care Follow up Recommendations: None SLP Visit Diagnosis: Dysphagia, unspecified (R13.10) Plan: All goals met       [place], MS, Erma [phone]  Colleen Carson 11/12/2020, 5:59 PM

## 2020-11-12 NOTE — Progress Notes (Signed)
Physical Therapy Treatment Patient Details Name: Colleen Carson MRN: [id number] DOB: 08/04/1925 Today's Date: 11/12/2020    History of Present Illness Patient is a 87 year old female who presents to the ER via EMS for evaluation of pain with swallowing as well as generalized weakness and confusion.  Patient was recently discharged from the hospital and according to the daughter has declined since her discharge and is very concerned about her mental status changes.  She is also concerned that patient is very weak and unable to ambulate. PMH: a-fib, HTN, CHF, hx of CVA, dyslipidemia, arthritis, lymphademia (LE), s/p THA, shingles    PT Comments    Pt seen for PT treatment with pt much more alert & oriented today! Pt is able to complete bed mobility without physical assistance & ambulate 2 laps total around unit with RW & CGA. PT instructs pt in BLE exercises as noted below. Updated pt's daughter & nurse on pt's improvement with functional mobility & cognition. Updated d/c recommendations to reflect HHPT & supervision for OOB mobility upon d/c home.     Follow Up Recommendations  Home health PT;Supervision for mobility/OOB     Equipment Recommendations  Rolling walker with 5" wheels    Recommendations for Other Services OT consult     Precautions / Restrictions Precautions Precautions: Fall Restrictions Weight Bearing Restrictions: No    Mobility  Bed Mobility Overal bed mobility: Modified Independent Bed Mobility: Supine to Sit     Supine to sit: Supervision;HOB elevated     General bed mobility comments: use of bed rails    Transfers Overall transfer level: Needs assistance Equipment used: Rolling walker (2 wheeled) Transfers: Stand Pivot Transfers;Sit to/from Stand Sit to Stand: Min guard Stand pivot transfers: Min assist       General transfer comment: cuing for hand placement to push up/reach back from armrests  Ambulation/Gait Ambulation/Gait assistance: Min  guard Gait Distance (Feet): 175 Feet (+ 175 ft) Assistive device: Rolling walker (2 wheeled) Gait Pattern/deviations: Decreased step length - left;Decreased step length - right;Decreased dorsiflexion - left;Decreased dorsiflexion - right;Decreased stride length     General Gait Details: decreased foot clearance LLE, slight L trendelenburg gait, no buckling noted during gait today   Stairs             Wheelchair Mobility    Modified Rankin (Stroke Patients Only)       Balance Overall balance assessment: Needs assistance Sitting-balance support: Feet supported;Bilateral upper extremity supported Sitting balance-Leahy Scale: Fair Sitting balance - Comments: supervision sitting EOB   Standing balance support: Bilateral upper extremity supported;During functional activity Standing balance-Leahy Scale: Poor Standing balance comment: UE support on RW during gait                            Cognition Arousal/Alertness: Awake/alert Behavior During Therapy: WFL for tasks assessed/performed Overall Cognitive Status: No family/caregiver present to determine baseline cognitive functioning Area of Impairment: Orientation;Attention;Memory;Safety/judgement;Following commands;Awareness;Problem solving                 Orientation Level: Disoriented to;Situation     Following Commands: Follows one step commands consistently Safety/Judgement: Decreased awareness of safety;Decreased awareness of deficits     General Comments: Pt much more alert & pt axo x 3 during this session!      Exercises General Exercises - Lower Extremity Ankle Circles/Pumps: AROM;Strengthening;Both;10 reps;Seated Long Arc Quad: AROM;Strengthening;Both;10 reps;Seated Hip Flexion/Marching: AROM;Strengthening;Both;10 reps;Seated    General Comments General comments (  skin integrity, edema, etc.): HR 73 bpm at rest at beginning & end of session      Pertinent Vitals/Pain Pain Assessment:  No/denies pain    Home Living                      Prior Function            PT Goals (current goals can now be found in the care plan section) Acute Rehab PT Goals Patient Stated Goal: return to PLOF PT Goal Formulation: With patient/family Time For Goal Achievement: 11/25/20 Potential to Achieve Goals: Fair Progress towards PT goals: Progressing toward goals    Frequency    Min 2X/week      PT Plan Discharge plan needs to be updated    Co-evaluation              AM-PAC PT "6 Clicks" Mobility   Outcome Measure  Help needed turning from your back to your side while in a flat bed without using bedrails?: None Help needed moving from lying on your back to sitting on the side of a flat bed without using bedrails?: A Little Help needed moving to and from a bed to a chair (including a wheelchair)?: A Little Help needed standing up from a chair using your arms (e.g., wheelchair or bedside chair)?: A Little Help needed to walk in hospital room?: A Little Help needed climbing 3-5 steps with a railing? : A Lot 6 Click Score: 18    End of Session Equipment Utilized During Treatment: Gait belt Activity Tolerance: Patient tolerated treatment well Patient left: in chair;with call bell/phone within reach;with chair alarm set Nurse Communication: Mobility status PT Visit Diagnosis: Muscle weakness (generalized) (M62.81);Difficulty in walking, not elsewhere classified (R26.2)     Time: 4696-2952 PT Time Calculation (min) (ACUTE ONLY): 25 min  Charges:  $Therapeutic Activity: 23-37 mins                     Aleda Grana, PT, DPT 11/12/20, 9:53 AM    Sandi Mariscal 11/12/2020, 9:53 AM

## 2020-11-12 NOTE — Progress Notes (Addendum)
Progress Note    Colleen Carson  PHX:[id number] DOB: Jan 21, 1925  DOA: 11/10/2020 PCP: Kandyce Rud, MD      Brief Narrative:    Medical records reviewed and are as summarized below:  Colleen Carson is a 87 y.o. female with medical history significant for atrial fibrillation on Eliquis, hypertension, chronic diastolic CHF, moderate to severe TR, history of stroke, dyslipidemia, recent discharge from the hospital on November 08, 2020.  She was brought to the hospital again because of confusion, pain on swallowing and difficulty swallowing.      Assessment/Plan:   Principal Problem:   Generalized weakness Active Problems:   Atrial fibrillation (HCC)   HTN (hypertension)   Chronic diastolic heart failure (HCC)   Thrombocytopenia (HCC)   Delirium    Body mass index is 25.41 kg/m.    Altered mental status, likely delirium: Her mental status is back to baseline.  Initial CT head did not show any acute abnormality.  Repeat CT head on 11/11/2018 did not show any evidence of acute stroke.  Urinalysis and chest x-ray did not show any evidence of infection.  Neurologist has signed off.  Dysphagia and odynophagia: Improved. Barium swallow did not show any evidence of stricture or mass.  There was report of "tertiary contractions of the distal half of the esophagus as can be seen with esophageal spasm".  No indication for EGD per GI.  GI has signed off.  Normal-sized thyroid with bilateral nodules.  There is a moderately suspicious 2.2 cm inferior right nodule also noted on thyroid ultrasound.  FNA biopsy recommended.  This can be done as an outpatient.  She follows up with Dr. Gershon Crane, endocrinologist, at [facility]. 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    Hypothermia: Resolved.  Etiology unclear.  No evidence of sepsis thus far.  TSH was 7.988.  Mild left arm swelling: No evidence of DVT on venous  duplex.  Paroxysmal atrial fibrillation and history of stroke: Continue Eliquis  Chronic diastolic CHF: Compensated.  Continue torsemide  Generalized weakness and fatigue: Improved.  PT upgraded their recommendation to home health therapy.  All of care was discussed with the daughter, Colleen Carson, over the phone.  Plan of care including diagnostic findings were discussed.  She is happy with her mother's progress.  However, she preferred that patient be kept overnight to make sure she was okay to go home because of multiple visits to the emergency room recently.     Diet Order            Diet regular Room service appropriate? Yes with Assist; Fluid consistency: Thin  Diet effective now                    Consultants:  Neurologist  Gastroenterologist  Procedures:  None    Medications:   . apixaban  2.5 mg Oral BID  . atorvastatin  40 mg Oral QHS  . loratadine  10 mg Oral Daily  . multivitamin with minerals  1 tablet Oral Daily  . sodium chloride flush  3 mL Intravenous Q12H  . torsemide  20 mg Oral Daily   Continuous Infusions: . sodium chloride       Anti-infectives (From admission, onward)   None             Family Communication/Anticipated D/C date and plan/Code Status   DVT prophylaxis: apixaban (ELIQUIS) tablet 2.5 mg Start: 11/10/20 1345 apixaban (ELIQUIS) tablet  2.5 mg     Code Status: DNR  Family Communication: Colleen Carson, daughter, at bedside Disposition Plan:    Status is: Observation  The patient will require care spanning > 2 midnights and should be moved to inpatient because: Unsafe d/c plan  Dispo: The patient is from: Home              Anticipated d/c is to: SNF              Anticipated d/c date is: 2 days              Patient currently is not medically stable to d/c.   Difficult to place patient No           Subjective:   Interval events noted.  She has no complaints.  She had just gotten back from barium swallow test.   She said she feels much better and she feels stronger.  No difficulty swallowing or pain on swallowing today.  She does not report any pain anywhere at all.  Objective:    Vitals:   11/12/20 0528 11/12/20 0800 11/12/20 1118 11/12/20 1654  BP: (!) 148/64 (!) 144/64 127/65 110/61  Pulse: (!) 59  80 62  Resp: Temp: (!) 97 F (36.1 C) (!) 97 F (36.1 C) 97.8 F (36.6 C) 97.6 F (36.4 C)  TempSrc: Rectal   Oral  SpO2: 98% 98% 97% 97%  Height:       No data found.   Intake/Output Summary (Last 24 hours) at 11/12/2020 1707 Last data filed at 11/12/2020 1000 Gross per 24 hour  Intake 240 ml  Output 1000 ml  Net -760 ml   There were no vitals filed for this visit.  Exam:  GEN: NAD SKIN: No rash EYES: EOMI ENT: MMM CV: RRR PULM: CTA B ABD: soft, ND, NT, +BS CNS: AAO x 3, non focal EXT: Mild swelling of the left upper extremity compared to the right       Data Reviewed:   I have personally reviewed following labs and imaging studies:  Labs: Labs show the following:   Basic Metabolic Panel: Recent Labs  Lab 11/07/20 1542 11/08/20 0547 11/08/20 1339 11/10/20 0909 11/11/20 0424  NA 134* 135  --  137 138  K 5.0 5.3*   < > 3.2* 3.5  CL 100 101  --  95* 99  CO2 25 21*  --  26 25  GLUCOSE 101* 82  --  107* 89  BUN 46* 43*  --  35* 36*  CREATININE 1.78* 1.61*  --  1.43* 1.34*  CALCIUM 9.6 9.6  --  9.7 9.5  MG 2.4  --   --   --   --    < > = values in this interval not displayed.   GFR Estimated Creatinine Clearance: 22 mL/min (A) (by C-G formula based on SCr of 1.34 mg/dL (H)). Liver Function Tests: Recent Labs  Lab 11/07/20 1542 11/10/20 0909  AST 47* 53*  ALT 28 26  ALKPHOS 230* 226*  BILITOT 1.3* 1.4*  PROT 7.2 7.4  ALBUMIN 4.1 4.1   No results for input(s): LIPASE, AMYLASE in the last 168 hours. No results for input(s): AMMONIA in the last 168 hours. Coagulation profile No results for input(s): INR, PROTIME in the last 168  hours.  CBC: Recent Labs  Lab 11/07/20 1542 11/08/20 0547 11/10/20 0909 11/11/20 0424  WBC 4.4 4.9 6.8 4.4  NEUTROABS 2.5  --  3.9  --   HGB 13.1 13.0 13.5 12.6  HCT 40.9 38.7 41.1 37.6  MCV 90.7 87.8 88.4 86.6  PLT 121* 117* 115* 108*   Cardiac Enzymes: Recent Labs  Lab 11/10/20 0909  CKTOTAL 79   BNP (last 3 results) No results for input(s): PROBNP in the last 8760 hours. CBG: No results for input(s): GLUCAP in the last 168 hours. D-Dimer: No results for input(s): DDIMER in the last 72 hours. Hgb A1c: No results for input(s): HGBA1C in the last 72 hours. Lipid Profile: No results for input(s): CHOL, HDL, LDLCALC, TRIG, CHOLHDL, LDLDIRECT in the last 72 hours. Thyroid function studies: Recent Labs    11/10/20 0909  TSH 7.988*   Anemia work up: No results for input(s): VITAMINB12, FOLATE, FERRITIN, TIBC, IRON, RETICCTPCT in the last 72 hours. Sepsis Labs: Recent Labs  Lab 11/07/20 1542 11/08/20 0546 11/08/20 0547 11/10/20 0909 11/11/20 0424  PROCALCITON <0.10  --   --  <0.10  --   WBC 4.4  --  4.9 6.8 4.4  LATICACIDVEN 1.2 1.4  --   --   --     Microbiology Recent Results (from the past 240 hour(s))  Resp Panel by RT-PCR (Flu A&B, Covid) Nasopharyngeal Swab     Status: None   Collection Time: 11/07/20  3:42 PM   Specimen: Nasopharyngeal Swab; Nasopharyngeal(NP) swabs in vial transport medium  Result Value Ref Range Status   SARS Coronavirus 2 by RT PCR NEGATIVE NEGATIVE Final    Comment: (NOTE) SARS-CoV-2 target nucleic acids are NOT DETECTED.  The SARS-CoV-2 RNA is generally detectable in upper respiratory specimens during the acute phase of infection. The lowest concentration of SARS-CoV-2 viral copies this assay can detect is 138 copies/mL. A negative result does not preclude SARS-Cov-2 infection and should not be used as the sole basis for treatment or other patient management decisions. A negative result may occur with  improper specimen  collection/handling, submission of specimen other than nasopharyngeal swab, presence of viral mutation(s) within the areas targeted by this assay, and inadequate number of viral copies(<138 copies/mL). A negative result must be combined with clinical observations, patient history, and epidemiological information. The expected result is Negative.  Fact Sheet for Patients:  BloggerCourse.comhttps://www.fda.gov/media/152166/download  Fact Sheet for Healthcare Providers:  SeriousBroker.ithttps://www.fda.gov/media/152162/download  This test is no t yet approved or cleared by the [place] FDA and  has been authorized for detection and/or diagnosis of SARS-CoV-2 by FDA under an Emergency Use Authorization (EUA). This EUA will remain  in effect (meaning this test can be used) for the duration of the COVID-19 declaration under Section 564(b)(1) of the Act, 21 U.S.C.section 360bbb-3(b)(1), unless the authorization is terminated  or revoked sooner.       Influenza A by PCR NEGATIVE NEGATIVE Final   Influenza B by PCR NEGATIVE NEGATIVE Final    Comment: (NOTE) The Xpert Xpress SARS-CoV-2/FLU/RSV plus assay is intended as an aid in the diagnosis of influenza from Nasopharyngeal swab specimens and should not be used as a sole basis for treatment. Nasal washings and aspirates are unacceptable for Xpert Xpress SARS-CoV-2/FLU/RSV testing.  Fact Sheet for Patients: BloggerCourse.comhttps://www.fda.gov/media/152166/download  Fact Sheet for Healthcare Providers: SeriousBroker.ithttps://www.fda.gov/media/152162/download  This test is not yet approved or cleared by the [place] FDA and has been authorized for detection and/or diagnosis of SARS-CoV-2 by FDA under an Emergency Use Authorization (EUA). This EUA will remain in effect (meaning this test can be used) for the duration of the COVID-19 declaration under Section 564(b)(1)  of the Act, 21 U.S.C. section 360bbb-3(b)(1), unless the authorization is terminated or revoked.  Performed at [facility], [address], [place], [place] [address]   Urine Culture     Status: None   Collection Time: 11/07/20  3:42 PM   Specimen: Urine, Random  Result Value Ref Range Status   Specimen Description   Final    URINE, RANDOM Performed at [facility], [address] [place], [place] [address]    Special Requests   Final    NONE Performed at [facility], [address], [place], [place] [address]    Culture   Final    NO GROWTH Performed at [facility], [address], [place], [place] [address]    Report Status 11/09/2020 FINAL  Final  Resp Panel by RT-PCR (Flu A&B, Covid) Urine, Catheterized     Status: None   Collection Time: 11/10/20  9:09 AM   Specimen: Urine, Catheterized; Nasopharyngeal(NP) swabs in vial transport medium  Result Value Ref Range Status   SARS Coronavirus 2 by RT PCR NEGATIVE NEGATIVE Final    Comment: (NOTE) SARS-CoV-2 target nucleic acids are NOT DETECTED.  The SARS-CoV-2 RNA is generally detectable in upper respiratory specimens during the acute phase of infection. The lowest concentration of SARS-CoV-2 viral copies this assay can detect is 138 copies/mL. A negative result does not preclude SARS-Cov-2 infection and should not be used as the sole basis for treatment or other patient management decisions. A negative result may occur with  improper specimen collection/handling, submission of specimen other than nasopharyngeal swab, presence of viral mutation(s) within the areas targeted by this assay, and inadequate number of viral copies(<138 copies/mL). A negative result must be combined with clinical observations, patient history, and epidemiological information. The expected result is Negative.  Fact Sheet for Patients:  BloggerCourse.com  Fact Sheet for Healthcare Providers:  SeriousBroker.it  This test is no t yet approved or cleared by the [place] FDA  and  has been authorized for detection and/or diagnosis of SARS-CoV-2 by FDA under an Emergency Use Authorization (EUA). This EUA will remain  in effect (meaning this test can be used) for the duration of the COVID-19 declaration under Section 564(b)(1) of the Act, 21 U.S.C.section 360bbb-3(b)(1), unless the authorization is terminated  or revoked sooner.       Influenza A by PCR NEGATIVE NEGATIVE Final   Influenza B by PCR NEGATIVE NEGATIVE Final    Comment: (NOTE) The Xpert Xpress SARS-CoV-2/FLU/RSV plus assay is intended as an aid in the diagnosis of influenza from Nasopharyngeal swab specimens and should not be used as a sole basis for treatment. Nasal washings and aspirates are unacceptable for Xpert Xpress SARS-CoV-2/FLU/RSV testing.  Fact Sheet for Patients: BloggerCourse.com  Fact Sheet for Healthcare Providers: SeriousBroker.it  This test is not yet approved or cleared by the [place] FDA and has been authorized for detection and/or diagnosis of SARS-CoV-2 by FDA under an Emergency Use Authorization (EUA). This EUA will remain in effect (meaning this test can be used) for the duration of the COVID-19 declaration under Section 564(b)(1) of the Act, 21 U.S.C. section 360bbb-3(b)(1), unless the authorization is terminated or revoked.  Performed at [facility], [address] [place], [place] [address]     Procedures and diagnostic studies:  CT HEAD WO CONTRAST  Result Date: 11/11/2020 CLINICAL DATA:  Confusion. EXAM: CT HEAD WITHOUT CONTRAST TECHNIQUE: Contiguous axial images were obtained from the base of the  skull through the vertex without intravenous contrast. COMPARISON:  Head CT 11/10/2020. FINDINGS: Brain: No evidence of acute infarction, hemorrhage, hydrocephalus, extra-axial collection or mass lesion/mass effect. Remote bilateral PCA infarcts again seen. Vascular: No hyperdense vessel or  unexpected calcification. Skull: Intact.  No focal lesion. Sinuses/Orbits: Negative. Other: None. IMPRESSION: No acute abnormality or change compared to yesterday's exam. Remote bilateral PCA infarcts. Electronically Signed   By: Drusilla Kanner M.D.   On: 11/11/2020 13:54   US Venous Img Upper Uni Left (DVT)  Result Date: 11/12/2020 CLINICAL DATA:  Left upper extremity edema for the past days. Patient is currently on anticoagulation. Evaluate for DVT. EXAM: LEFT UPPER EXTREMITY VENOUS DOPPLER ULTRASOUND TECHNIQUE: Gray-scale sonography with graded compression, as well as color Doppler and duplex ultrasound were performed to evaluate the upper extremity deep venous system from the level of the subclavian vein and including the jugular, axillary, basilic, radial, ulnar and upper cephalic vein. Spectral Doppler was utilized to evaluate flow at rest and with distal augmentation maneuvers. COMPARISON:  None. FINDINGS: Contralateral Subclavian Vein: Respiratory phasicity is normal and symmetric with the symptomatic side. No evidence of thrombus. Normal compressibility. Internal Jugular Vein: No evidence of thrombus. Normal compressibility, respiratory phasicity and response to augmentation. Subclavian Vein: No evidence of thrombus. Normal compressibility, respiratory phasicity and response to augmentation. Axillary Vein: No evidence of thrombus. Normal compressibility, respiratory phasicity and response to augmentation. Cephalic Vein: No evidence of thrombus. Normal compressibility, respiratory phasicity and response to augmentation. Basilic Vein: No evidence of thrombus. Normal compressibility, respiratory phasicity and response to augmentation. Brachial Veins: No evidence of thrombus. Normal compressibility, respiratory phasicity and response to augmentation. Radial Veins: No evidence of thrombus. Normal compressibility, respiratory phasicity and response to augmentation. Ulnar Veins: No evidence of thrombus.  Normal compressibility, respiratory phasicity and response to augmentation. Venous Reflux:  None visualized. Other Findings:  None visualized. IMPRESSION: No evidence of DVT within the left upper extremity. Electronically Signed   By: Simonne Come M.D.   On: 11/12/2020 16:44   US THYROID  Result Date: 11/12/2020 CLINICAL DATA:  Odynophagia EXAM: THYROID ULTRASOUND TECHNIQUE: Ultrasound examination of the thyroid gland and adjacent soft tissues was performed. COMPARISON:  None. FINDINGS: Parenchymal Echotexture: Mildly heterogenous Isthmus: 0.3 cm thickness Right lobe: 4.4 x 3.1 x 2.1 cm Left lobe: 3.8 x 2 x 2.4 cm _________________________________________________________ Estimated total number of nodules >/= 1 cm: 3 Number of spongiform nodules >/=  2 cm not described below (TR1): 0 Number of mixed cystic and solid nodules >/= 1.5 cm not described below (TR2): 0 _________________________________________________________ Nodule # 1: 0.9 cm hypoechoic nodule without calcifications, mid right; This nodule does NOT meet TI-RADS criteria for biopsy or dedicated follow-up. Nodule # 2: 0.7 cm benign colloid cyst, mid right Nodule # 3: Location: Right; Inferior Maximum size: 1.4 cm; Other 2 dimensions: 1.2 x 1.2 cm Composition: mixed cystic and solid (1) Echogenicity: hypoechoic (2) Shape: not taller-than-wide (0) Margins: ill-defined (0) Echogenic foci: none (0) ACR TI-RADS total points: 3. ACR TI-RADS risk category: TR3 (3 points). ACR TI-RADS recommendations: Given size (<1.4 cm) and appearance, this nodule does NOT meet TI-RADS criteria for biopsy or dedicated follow-up. _________________________________________________________ Nodule # 4: Location: Right; Inferior Maximum size: 2.2 cm; Other 2 dimensions: 1.7 x 1.7 cm Composition: solid/almost completely solid (2) Echogenicity: hypoechoic (2) Shape: not taller-than-wide (0) Margins: smooth (0) Echogenic foci: none (0) ACR TI-RADS total points: 4. ACR TI-RADS risk  category: TR4 (4-6 points). ACR TI-RADS recommendations: **Given size (>/=  1.5 cm) and appearance, fine needle aspiration of this moderately suspicious nodule should be considered based on TI-RADS criteria. _________________________________________________________ Nodule # 5: Location: Left; Superior Maximum size: 1.4 cm; Other 2 dimensions: 1.4 x 1.1 cm Composition: solid/almost completely solid (2) Echogenicity: hypoechoic (2) Shape: not taller-than-wide (0) Margins: smooth (0) Echogenic foci: none (0) ACR TI-RADS total points: 4. ACR TI-RADS risk category: TR4 (4-6 points). ACR TI-RADS recommendations: *Given size (>/= 1 - 1.4 cm) and appearance, a follow-up ultrasound in 1 year should be considered based on TI-RADS criteria. _________________________________________________________ Nodule # 6: 0.7 cm hypoechoic nodule without calcifications, mid left; This nodule does NOT meet TI-RADS criteria for biopsy or dedicated follow-up. IMPRESSION: 1. Normal-sized thyroid with bilateral nodules. Recommend FNA biopsy of moderately suspicious 2.2 cm inferior right nodule. 2. Recommend annual/biennial ultrasound follow-up of additional nodules as above, until stability x5 years confirmed. The above is in keeping with the ACR TI-RADS recommendations - J Am Coll Radiol 2017;14:587-595. Electronically Signed   By: Corlis Leak M.D.   On: 11/12/2020 07:41   DG ESOPHAGUS W SINGLE CM (SOL OR THIN BA)  Result Date: 11/12/2020 CLINICAL DATA:  Dysphagia EXAM: ESOPHOGRAM/BARIUM SWALLOW TECHNIQUE: Single contrast examination was performed using thin barium or water soluble. FLUOROSCOPY TIME:  Fluoroscopy Time:  0.8 minute Radiation Exposure Index (if provided by the fluoroscopic device): 4.2 mGy Number of Acquired Spot Images: 0 COMPARISON:  None. FINDINGS: Normal pharyngeal anatomy and motility. Small volume silent tracheal aspiration. Contrast flowed freely through the esophagus without evidence of a stricture or mass. Normal  esophageal mucosa without evidence of irregularity or ulceration. Tertiary contractions of the distal half of the esophagus as can be seen with esophageal spasm. No evidence of reflux. No definite hiatal hernia was demonstrated. IMPRESSION: Tertiary contractions of the distal half of the esophagus as can be seen with esophageal spasm. No esophageal stricture. Small volume silent tracheal aspiration. Electronically Signed   By: Elige Ko   On: 11/12/2020 16:04               LOS: 1 day   Salah Burlison  [facility]   Pager on [email or web address]. If 7PM-7AM, please contact night-coverage at [email or web address]     11/12/2020, 5:07 PM

## 2020-11-13 DIAGNOSIS — I5032 Chronic diastolic (congestive) heart failure: Secondary | ICD-10-CM | POA: Diagnosis not present

## 2020-11-13 DIAGNOSIS — R410 Disorientation, unspecified: Secondary | ICD-10-CM | POA: Diagnosis not present

## 2020-11-13 LAB — CBC WITH DIFFERENTIAL/PLATELET
Abs Immature Granulocytes: 0.01 10*3/uL (ref 0.00–0.07)
Basophils Absolute: 0 10*3/uL (ref 0.0–0.1)
Basophils Relative: 1 %
Eosinophils Absolute: 0.1 10*3/uL (ref 0.0–0.5)
Eosinophils Relative: 1 %
HCT: 39.5 % (ref 36.0–46.0)
Hemoglobin: 13.3 g/dL (ref 12.0–15.0)
Immature Granulocytes: 0 %
Lymphocytes Relative: 28 %
Lymphs Abs: 1.6 10*3/uL (ref 0.7–4.0)
MCH: 29.5 pg (ref 26.0–34.0)
MCHC: 33.7 g/dL (ref 30.0–36.0)
MCV: 87.6 fL (ref 80.0–100.0)
Monocytes Absolute: 0.6 10*3/uL (ref 0.1–1.0)
Monocytes Relative: 11 %
Neutro Abs: 3.3 10*3/uL (ref 1.7–7.7)
Neutrophils Relative %: 59 %
Platelets: 108 10*3/uL — ABNORMAL LOW (ref 150–400)
RBC: 4.51 MIL/uL (ref 3.87–5.11)
RDW: 15.9 % — ABNORMAL HIGH (ref 11.5–15.5)
WBC: 5.6 10*3/uL (ref 4.0–10.5)
nRBC: 0 % (ref 0.0–0.2)

## 2020-11-13 MED ORDER — TORSEMIDE 20 MG PO TABS: 20.0000 mg | ORAL_TABLET | Freq: Every day | ORAL | Status: DC

## 2020-11-13 NOTE — Discharge Summary (Addendum)
Physician Discharge Summary  Colleen Carson:[id number] DOB: July 19, 1925 DOA: 11/10/2020  PCP: Kandyce Rud, MD  Admit date: 11/10/2020 Discharge date: 11/13/2020  Discharge disposition: Home with home health therapy   Recommendations for Outpatient Follow-Up:   Follow up with PCP in 1 week. Follow up with Dr. Gershon Crane for evaluation of thyroid nodule.   Discharge Diagnosis:   Principal Problem:   Generalized weakness Active Problems:   Atrial fibrillation (HCC)   HTN (hypertension)   Chronic diastolic heart failure (HCC)   Thrombocytopenia (HCC)   Delirium    Discharge Condition: Stable.  Diet recommendation:  Diet Order            Diet - low sodium heart healthy           Diet regular Room service appropriate? Yes with Assist; Fluid consistency: Thin  Diet effective now                   Code Status: DNR     Hospital Course:   Ms. Colleen Carson is a 87 y.o. female with medical history significant for atrial fibrillation on Eliquis, hypertension, chronic diastolic CHF, moderate to severe TR, history of stroke, dyslipidemia, recent discharge from the hospital on November 08, 2020.  She was brought to the hospital again because of confusion, pain on swallowing and difficulty swallowing.      Delirium: Resolved. Initial CT head did not show any acute abnormality.  Repeat CT head on 11/11/2018 did not show any evidence of acute stroke.  Urinalysis and chest x-ray did not show any evidence of infection.  Neurologist has signed off.  Dysphagia and odynophagia: Improved.  Barium swallow did not show any evidence of stricture or mass.  There was report of "tertiary contractions of the distal half of the esophagus as can be seen with esophageal spasm".  No indication for EGD per GI.  GI has signed off.  Normal-sized thyroid with bilateral nodules.  There is a moderately suspicious 2.2 cm inferior right nodule also noted on thyroid ultrasound.  FNA biopsy  recommended.  This can be done as an outpatient.  She follows up with Dr. Gershon Crane, endocrinologist, at [facility]. 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   Hypothermia: Resolved.  Etiology unclear.  No evidence of sepsis thus far. TSH was 7.988.  Hypokalemia: improved  Mild left arm swelling: No evidence of DVT on venous duplex.  Paroxysmal atrial fibrillation and history of stroke: Continue Eliquis  Chronic diastolic CHF: Compensated.  Continue torsemide  Thrombocytopenia: platelets are stable  Generalized weakness and fatigue: Improved.  PT upgraded their recommendation to home health therapy.     Discharge plan was discussed with the patient and her daughter, Colleen Carson, at the bedside.  Colleen Carson had a lot of questions and all her questions were answered.   Medical Consultants:    Neurologist   Discharge Exam:    Vitals:   11/12/20 1654 11/12/20 1925 11/12/20 2203 11/13/20 0515  BP: 110/61 128/69  135/72  Pulse: 62 63  67  Resp: Temp: 97.6 F (36.4 C)  97.8 F (36.6 C) (!) 97.3 F (36.3 C)  TempSrc: Oral  Oral Oral  SpO2: 97% 99%  96%  Height:         GEN: NAD SKIN: Warm and dry EYES: EOMI ENT: MMM CV: RRR PULM: CTA B ABD: soft, ND, NT, +BS CNS: AAO x 3, non focal EXT:  No edema or tenderness   The results of significant diagnostics from this hospitalization (including imaging, microbiology, ancillary and laboratory) are listed below for reference.     Procedures and Diagnostic Studies:   CT ABDOMEN PELVIS WO CONTRAST  Result Date: 11/10/2020 CLINICAL DATA:  87 year old female with abdominal pain, dysuria and altered mental status. EXAM: CT ABDOMEN AND PELVIS WITHOUT CONTRAST TECHNIQUE: Multidetector CT imaging of the abdomen and pelvis was performed following the standard protocol without IV contrast. COMPARISON:  08/09/2019 chest CT. FINDINGS: Please note that parenchymal  abnormalities may be missed without intravenous contrast. Lower chest: Cardiomegaly, trace bilateral pleural effusion and pacemaker leads are present. Mild bibasilar atelectasis is noted. Hepatobiliary: No definite hepatic abnormality noted. No gallbladder abnormalities are identified. No biliary dilatation. Pancreas: Unremarkable Spleen: Unremarkable Adrenals/Urinary Tract: Renal cortical thinning noted. Multiple lesions within both kidneys probably represent cysts. There is no evidence of hydronephrosis or obstructing urinary calculi. Renal vascular calcifications are present. The adrenal glands are unremarkable. The bladder is not well distended. Stomach/Bowel: There is no evidence of bowel obstruction or pneumoperitoneum. Colonic diverticulosis noted without definite evidence of acute diverticulitis. No suspicious bowel wall thickening is noted. Vascular/Lymphatic: Aortic atherosclerosis. No enlarged abdominal or pelvic lymph nodes. Reproductive: No significant abnormality identified but LEFT hip replacement artifact obscures detail within the pelvis. Other: There may be a trace amount of free fluid within the posterior LOWER pelvis. Musculoskeletal: LEFT hip arthroplasty changes noted. Degenerative changes within the lumbar spine are present. IMPRESSION: 1. Question trace free fluid within the pelvis of uncertain etiology. 2. Cardiomegaly, trace bilateral pleural effusions and mild bibasilar atelectasis. 3. Colonic diverticulosis without definite evidence of acute diverticulitis. 4. Aortic Atherosclerosis (ICD10-I70.0). Electronically Signed   By: Harmon Pier M.D.   On: 11/10/2020 09:59   DG Chest 1 View  Result Date: 11/10/2020 CLINICAL DATA:  87 year old female with altered mental status EXAM: CHEST  1 VIEW COMPARISON:  11/07/2020 FINDINGS: Cardiomediastinal silhouette unchanged. Pacing device on the left chest wall with 2 leads in place. Calcifications of the aortic arch. No pneumothorax. Similar  appearance at the left lung base with blunting at the left costophrenic angle. Coarsened interstitial markings throughout with no interlobular septal thickening. No confluent airspace disease. IMPRESSION: Similar appearance of the chest x-ray with likely scarring at the left lung base and no evidence of acute cardiopulmonary disease. Unchanged left chest wall pacing device and cardiomegaly. Electronically Signed   By: Gilmer Mor D.O.   On: 11/10/2020 09:55   CT HEAD WO CONTRAST  Result Date: 11/11/2020 CLINICAL DATA:  Confusion. EXAM: CT HEAD WITHOUT CONTRAST TECHNIQUE: Contiguous axial images were obtained from the base of the skull through the vertex without intravenous contrast. COMPARISON:  Head CT 11/10/2020. FINDINGS: Brain: No evidence of acute infarction, hemorrhage, hydrocephalus, extra-axial collection or mass lesion/mass effect. Remote bilateral PCA infarcts again seen. Vascular: No hyperdense vessel or unexpected calcification. Skull: Intact.  No focal lesion. Sinuses/Orbits: Negative. Other: None. IMPRESSION: No acute abnormality or change compared to yesterday's exam. Remote bilateral PCA infarcts. Electronically Signed   By: Drusilla Kanner M.D.   On: 11/11/2020 13:54   CT Head Wo Contrast  Result Date: 11/10/2020 CLINICAL DATA:  Mental status change with unknown cause EXAM: CT HEAD WITHOUT CONTRAST TECHNIQUE: Contiguous axial images were obtained from the base of the skull through the vertex without intravenous contrast. COMPARISON:  06/21/2019 FINDINGS: Brain: No evidence of acute infarction, hemorrhage, hydrocephalus, extra-axial collection or mass lesion/mass effect. Remote bilateral occipital infarct at the  poles. Vascular: Atheromatous calcification. Skull: Normal. Negative for fracture or focal lesion. Sinuses/Orbits: No acute finding. IMPRESSION: No acute or reversible finding.  Stable from 2020. Electronically Signed   By: Marnee Spring M.D.   On: 11/10/2020 09:42   US  THYROID  Result Date: 11/12/2020 CLINICAL DATA:  Odynophagia EXAM: THYROID ULTRASOUND TECHNIQUE: Ultrasound examination of the thyroid gland and adjacent soft tissues was performed. COMPARISON:  None. FINDINGS: Parenchymal Echotexture: Mildly heterogenous Isthmus: 0.3 cm thickness Right lobe: 4.4 x 3.1 x 2.1 cm Left lobe: 3.8 x 2 x 2.4 cm _________________________________________________________ Estimated total number of nodules >/= 1 cm: 3 Number of spongiform nodules >/=  2 cm not described below (TR1): 0 Number of mixed cystic and solid nodules >/= 1.5 cm not described below (TR2): 0 _________________________________________________________ Nodule # 1: 0.9 cm hypoechoic nodule without calcifications, mid right; This nodule does NOT meet TI-RADS criteria for biopsy or dedicated follow-up. Nodule # 2: 0.7 cm benign colloid cyst, mid right Nodule # 3: Location: Right; Inferior Maximum size: 1.4 cm; Other 2 dimensions: 1.2 x 1.2 cm Composition: mixed cystic and solid (1) Echogenicity: hypoechoic (2) Shape: not taller-than-wide (0) Margins: ill-defined (0) Echogenic foci: none (0) ACR TI-RADS total points: 3. ACR TI-RADS risk category: TR3 (3 points). ACR TI-RADS recommendations: Given size (<1.4 cm) and appearance, this nodule does NOT meet TI-RADS criteria for biopsy or dedicated follow-up. _________________________________________________________ Nodule # 4: Location: Right; Inferior Maximum size: 2.2 cm; Other 2 dimensions: 1.7 x 1.7 cm Composition: solid/almost completely solid (2) Echogenicity: hypoechoic (2) Shape: not taller-than-wide (0) Margins: smooth (0) Echogenic foci: none (0) ACR TI-RADS total points: 4. ACR TI-RADS risk category: TR4 (4-6 points). ACR TI-RADS recommendations: **Given size (>/= 1.5 cm) and appearance, fine needle aspiration of this moderately suspicious nodule should be considered based on TI-RADS criteria. _________________________________________________________ Nodule # 5:  Location: Left; Superior Maximum size: 1.4 cm; Other 2 dimensions: 1.4 x 1.1 cm Composition: solid/almost completely solid (2) Echogenicity: hypoechoic (2) Shape: not taller-than-wide (0) Margins: smooth (0) Echogenic foci: none (0) ACR TI-RADS total points: 4. ACR TI-RADS risk category: TR4 (4-6 points). ACR TI-RADS recommendations: *Given size (>/= 1 - 1.4 cm) and appearance, a follow-up ultrasound in 1 year should be considered based on TI-RADS criteria. _________________________________________________________ Nodule # 6: 0.7 cm hypoechoic nodule without calcifications, mid left; This nodule does NOT meet TI-RADS criteria for biopsy or dedicated follow-up. IMPRESSION: 1. Normal-sized thyroid with bilateral nodules. Recommend FNA biopsy of moderately suspicious 2.2 cm inferior right nodule. 2. Recommend annual/biennial ultrasound follow-up of additional nodules as above, until stability x5 years confirmed. The above is in keeping with the ACR TI-RADS recommendations - J Am Coll Radiol 2017;14:587-595. Electronically Signed   By: Corlis Leak M.D.   On: 11/12/2020 07:41     Labs:   Basic Metabolic Panel: Recent Labs  Lab 11/07/20 1542 11/08/20 0547 11/08/20 1339 11/10/20 0909 11/11/20 0424  NA 134* 135  --  137 138  K 5.0 5.3*   < > 3.2* 3.5  CL 100 101  --  95* 99  CO2 25 21*  --  26 25  GLUCOSE 101* 82  --  107* 89  BUN 46* 43*  --  35* 36*  CREATININE 1.78* 1.61*  --  1.43* 1.34*  CALCIUM 9.6 9.6  --  9.7 9.5  MG 2.4  --   --   --   --    < > = values in this interval not displayed.  GFR Estimated Creatinine Clearance: 22 mL/min (A) (by C-G formula based on SCr of 1.34 mg/dL (H)). Liver Function Tests: Recent Labs  Lab 11/07/20 1542 11/10/20 0909  AST 47* 53*  ALT 28 26  ALKPHOS 230* 226*  BILITOT 1.3* 1.4*  PROT 7.2 7.4  ALBUMIN 4.1 4.1   No results for input(s): LIPASE, AMYLASE in the last 168 hours. No results for input(s): AMMONIA in the last 168 hours. Coagulation  profile No results for input(s): INR, PROTIME in the last 168 hours.  CBC: Recent Labs  Lab 11/07/20 1542 11/08/20 0547 11/10/20 0909 11/11/20 0424 11/13/20 0858  WBC 4.4 4.9 6.8 4.4 5.6  NEUTROABS 2.5  --  3.9  --  3.3  HGB 13.1 13.0 13.5 12.6 13.3  HCT 40.9 38.7 41.1 37.6 39.5  MCV 90.7 87.8 88.4 86.6 87.6  PLT 121* 117* 115* 108* 108*   Cardiac Enzymes: Recent Labs  Lab 11/10/20 0909  CKTOTAL 79   BNP: Invalid input(s): POCBNP CBG: No results for input(s): GLUCAP in the last 168 hours. D-Dimer No results for input(s): DDIMER in the last 72 hours. Hgb A1c No results for input(s): HGBA1C in the last 72 hours. Lipid Profile No results for input(s): CHOL, HDL, LDLCALC, TRIG, CHOLHDL, LDLDIRECT in the last 72 hours. Thyroid function studies No results for input(s): TSH, T4TOTAL, T3FREE, THYROIDAB in the last 72 hours.  Invalid input(s): FREET3 Anemia work up No results for input(s): VITAMINB12, FOLATE, FERRITIN, TIBC, IRON, RETICCTPCT in the last 72 hours. Microbiology Recent Results (from the past 240 hour(s))  Resp Panel by RT-PCR (Flu A&B, Covid) Nasopharyngeal Swab     Status: None   Collection Time: 11/07/20  3:42 PM   Specimen: Nasopharyngeal Swab; Nasopharyngeal(NP) swabs in vial transport medium  Result Value Ref Range Status   SARS Coronavirus 2 by RT PCR NEGATIVE NEGATIVE Final    Comment: (NOTE) SARS-CoV-2 target nucleic acids are NOT DETECTED.  The SARS-CoV-2 RNA is generally detectable in upper respiratory specimens during the acute phase of infection. The lowest concentration of SARS-CoV-2 viral copies this assay can detect is 138 copies/mL. A negative result does not preclude SARS-Cov-2 infection and should not be used as the sole basis for treatment or other patient management decisions. A negative result may occur with  improper specimen collection/handling, submission of specimen other than nasopharyngeal swab, presence of viral mutation(s)  within the areas targeted by this assay, and inadequate number of viral copies(<138 copies/mL). A negative result must be combined with clinical observations, patient history, and epidemiological information. The expected result is Negative.  Fact Sheet for Patients:  BloggerCourse.com  Fact Sheet for Healthcare Providers:  SeriousBroker.it  This test is no t yet approved or cleared by the [place] FDA and  has been authorized for detection and/or diagnosis of SARS-CoV-2 by FDA under an Emergency Use Authorization (EUA). This EUA will remain  in effect (meaning this test can be used) for the duration of the COVID-19 declaration under Section 564(b)(1) of the Act, 21 U.S.C.section 360bbb-3(b)(1), unless the authorization is terminated  or revoked sooner.       Influenza A by PCR NEGATIVE NEGATIVE Final   Influenza B by PCR NEGATIVE NEGATIVE Final    Comment: (NOTE) The Xpert Xpress SARS-CoV-2/FLU/RSV plus assay is intended as an aid in the diagnosis of influenza from Nasopharyngeal swab specimens and should not be used as a sole basis for treatment. Nasal washings and aspirates are unacceptable for Xpert Xpress SARS-CoV-2/FLU/RSV testing.  Fact Sheet  for Patients: BloggerCourse.com  Fact Sheet for Healthcare Providers: SeriousBroker.it  This test is not yet approved or cleared by the [place] FDA and has been authorized for detection and/or diagnosis of SARS-CoV-2 by FDA under an Emergency Use Authorization (EUA). This EUA will remain in effect (meaning this test can be used) for the duration of the COVID-19 declaration under Section 564(b)(1) of the Act, 21 U.S.C. section 360bbb-3(b)(1), unless the authorization is terminated or revoked.  Performed at [facility], [address], [place], [place] [address]   Urine Culture     Status: None   Collection  Time: 11/07/20  3:42 PM   Specimen: Urine, Random  Result Value Ref Range Status   Specimen Description   Final    URINE, RANDOM Performed at [facility], [address] [place], [place] [address]    Special Requests   Final    NONE Performed at [facility], [address], [place], [place] [address]    Culture   Final    NO GROWTH Performed at [facility], [address], [place], [place] [address]    Report Status 11/09/2020 FINAL  Final  Resp Panel by RT-PCR (Flu A&B, Covid) Urine, Catheterized     Status: None   Collection Time: 11/10/20  9:09 AM   Specimen: Urine, Catheterized; Nasopharyngeal(NP) swabs in vial transport medium  Result Value Ref Range Status   SARS Coronavirus 2 by RT PCR NEGATIVE NEGATIVE Final    Comment: (NOTE) SARS-CoV-2 target nucleic acids are NOT DETECTED.  The SARS-CoV-2 RNA is generally detectable in upper respiratory specimens during the acute phase of infection. The lowest concentration of SARS-CoV-2 viral copies this assay can detect is 138 copies/mL. A negative result does not preclude SARS-Cov-2 infection and should not be used as the sole basis for treatment or other patient management decisions. A negative result may occur with  improper specimen collection/handling, submission of specimen other than nasopharyngeal swab, presence of viral mutation(s) within the areas targeted by this assay, and inadequate number of viral copies(<138 copies/mL). A negative result must be combined with clinical observations, patient history, and epidemiological information. The expected result is Negative.  Fact Sheet for Patients:  BloggerCourse.com  Fact Sheet for Healthcare Providers:  SeriousBroker.it  This test is no t yet approved or cleared by the [place] FDA and  has been authorized for detection and/or diagnosis of SARS-CoV-2 by FDA under an Emergency Use  Authorization (EUA). This EUA will remain  in effect (meaning this test can be used) for the duration of the COVID-19 declaration under Section 564(b)(1) of the Act, 21 U.S.C.section 360bbb-3(b)(1), unless the authorization is terminated  or revoked sooner.       Influenza A by PCR NEGATIVE NEGATIVE Final   Influenza B by PCR NEGATIVE NEGATIVE Final    Comment: (NOTE) The Xpert Xpress SARS-CoV-2/FLU/RSV plus assay is intended as an aid in the diagnosis of influenza from Nasopharyngeal swab specimens and should not be used as a sole basis for treatment. Nasal washings and aspirates are unacceptable for Xpert Xpress SARS-CoV-2/FLU/RSV testing.  Fact Sheet for Patients: BloggerCourse.com  Fact Sheet for Healthcare Providers: SeriousBroker.it  This test is not yet approved or cleared by the [place] FDA and has been authorized for detection and/or diagnosis of SARS-CoV-2 by FDA under an Emergency Use Authorization (EUA). This EUA will remain in effect (meaning this test can be used) for the duration of the COVID-19 declaration under Section 564(b)(1)  of the Act, 21 U.S.C. section 360bbb-3(b)(1), unless the authorization is terminated or revoked.  Performed at [facility], [address] [place], [place] [address]      Discharge Instructions:   Discharge Instructions    Diet - low sodium heart healthy   Complete by: As directed    Face-to-face encounter (required for Medicare/Medicaid patients)   Complete by: As directed    I Bhavik Cabiness certify that this patient is under my care and that I, or a nurse practitioner or physician's assistant working with me, had a face-to-face encounter that meets the physician face-to-face encounter requirements with this patient on 11/13/2020. The encounter with the patient was in whole, or in part for the following medical condition(s) which is the primary reason for home  health care (List medical condition): CHF, debility   The encounter with the patient was in whole, or in part, for the following medical condition, which is the primary reason for home health care: CHF, debility   I certify that, based on my findings, the following services are medically necessary home health services:  Physical therapy Nursing     Reason for Medically Necessary Home Health Services: Therapy- Investment banker, operationalGait Training, Patent examinerTransfer Training and Stair Training   My clinical findings support the need for the above services: Unsafe ambulation due to balance issues   Further, I certify that my clinical findings support that this patient is homebound due to: Unsafe ambulation due to balance issues   Home Health   Complete by: As directed    To provide the following care/treatments:  PT OT Home Health Aide     Increase activity slowly   Complete by: As directed      Allergies as of 11/13/2020      Reactions   Clonazepam Other (See Comments)   Heavy sedation   Morphine Nausea Only, Other (See Comments)   Hallucinations, nausea, vomitting   Morphine And Related Nausea And Vomiting      Medication List    TAKE these medications   apixaban 2.5 MG Tabs tablet Commonly known as: ELIQUIS Take 1 tablet (2.5 mg total) by mouth 2 (two) times daily.   atorvastatin 40 MG tablet Commonly known as: Lipitor Take 1 tablet (40 mg total) by mouth at bedtime.   CENTRUM SILVER 50+WOMEN PO Take 1 tablet by mouth daily.   cetirizine 10 MG tablet Commonly known as: ZYRTEC Take 10 mg by mouth daily.   Cranberry 1000 MG Caps Take by mouth daily.   torsemide 20 MG tablet Commonly known as: DEMADEX Take 1 tablet (20 mg total) by mouth daily.       Follow-up Information    Sherlon Handing'Connell, Thomas L, MD. Schedule an appointment as soon as possible for a visit in 1 week(s).   Specialties: Internal Medicine, Endocrinology Contact information: [address] [place]  [id number] [phone]                Time coordinating discharge: 32 minutes  Signed:  Lurene ShadowBERNARD Seon Gaertner  [facility] 11/13/2020, 10:29 AM   Pager on [email or web address]. If 7PM-7AM, please contact night-coverage at [email or web address]

## 2020-11-13 NOTE — TOC Transition Note (Signed)
Transition of Care [facility]) - CM/SW Discharge Note   Patient Details  Name: ANNELISE MCCOY MRN: [id number] Date of Birth: 05/28/1925  Transition of Care [facility]) CM/SW Contact:  Chapman Fitch, RN Phone Number: 11/13/2020, 10:42 AM   Clinical Narrative:    Patient to discharge today Daughter at bedside and will transport  Barbara Cower with [facility] notified of discharge   Final next level of care: Home w Home Health Services Barriers to Discharge: No Barriers Identified   Patient Goals and CMS Choice        Discharge Placement                       Discharge Plan and Services                          HH Arranged: PT,OT,Nurse's Aide [facility] Agency: [facility] Date [facility] Agency Contacted: 11/13/20   Representative spoke with at [facility] Agency: Barbara Cower  Social Determinants of Health (SDOH) Interventions     Readmission Risk Interventions No flowsheet data found.

## 2020-11-14 ENCOUNTER — Telehealth: Payer: Self-pay | Admitting: Family

## 2020-11-14 NOTE — Telephone Encounter (Signed)
Patient's daughter called to ask about the swelling in patient's legs. She says that patient was discharged yesterday and today she has noticed that her legs are swollen again and her daughter is concerned. Patient says that she's drank ~ 20 ounces of fluid today (it's around 2pm) and she's taken an extra torsemide.   Advised daughter to have patient use the compression boots that she has at home to assist with moving the fluid. Patient previously hasn't liked using that because she says that they make her feel dizzy. Also advised daughter that patient needs to drink closer to 60 ounces of fluid daily. Daughter says that she will put them on her and let us know if swelling continues.   She may benefit from lymphedema clinic.

## 2020-11-21 ENCOUNTER — Ambulatory Visit: Payer: Medicare Other | Admitting: Oncology

## 2020-11-26 ENCOUNTER — Inpatient Hospital Stay: Payer: Medicare Other | Admitting: Oncology

## 2020-11-26 DIAGNOSIS — R946 Abnormal results of thyroid function studies: Secondary | ICD-10-CM | POA: Insufficient documentation

## 2020-11-26 DIAGNOSIS — Z9889 Other specified postprocedural states: Secondary | ICD-10-CM | POA: Insufficient documentation

## 2020-11-26 DIAGNOSIS — R4182 Altered mental status, unspecified: Secondary | ICD-10-CM | POA: Insufficient documentation

## 2020-11-26 DIAGNOSIS — I5030 Unspecified diastolic (congestive) heart failure: Secondary | ICD-10-CM | POA: Insufficient documentation

## 2020-11-26 DIAGNOSIS — E042 Nontoxic multinodular goiter: Secondary | ICD-10-CM | POA: Insufficient documentation

## 2020-11-26 DIAGNOSIS — N189 Chronic kidney disease, unspecified: Secondary | ICD-10-CM | POA: Insufficient documentation

## 2020-11-26 DIAGNOSIS — I890 Lymphedema, not elsewhere classified: Secondary | ICD-10-CM | POA: Insufficient documentation

## 2020-11-26 DIAGNOSIS — E785 Hyperlipidemia, unspecified: Secondary | ICD-10-CM | POA: Insufficient documentation

## 2020-11-26 DIAGNOSIS — Z8673 Personal history of transient ischemic attack (TIA), and cerebral infarction without residual deficits: Secondary | ICD-10-CM | POA: Insufficient documentation

## 2020-12-18 ENCOUNTER — Other Ambulatory Visit
Admission: RE | Admit: 2020-12-18 | Discharge: 2020-12-18 | Disposition: A | Discharge status: Home / Self Care | Payer: Medicare Other | Source: Ambulatory Visit | Attending: Cardiology | Admitting: Cardiology

## 2020-12-18 DIAGNOSIS — I5032 Chronic diastolic (congestive) heart failure: Secondary | ICD-10-CM | POA: Insufficient documentation

## 2020-12-18 DIAGNOSIS — Z950 Presence of cardiac pacemaker: Secondary | ICD-10-CM | POA: Insufficient documentation

## 2020-12-18 LAB — BRAIN NATRIURETIC PEPTIDE: B Natriuretic Peptide: 267.9 pg/mL — ABNORMAL HIGH (ref 0.0–100.0)

## 2020-12-28 DIAGNOSIS — F0150 Vascular dementia without behavioral disturbance: Secondary | ICD-10-CM | POA: Insufficient documentation

## 2021-01-16 ENCOUNTER — Other Ambulatory Visit
Admission: RE | Admit: 2021-01-16 | Discharge: 2021-01-16 | Disposition: A | Discharge status: Home / Self Care | Payer: Medicare Other | Source: Ambulatory Visit | Attending: Cardiology | Admitting: Cardiology

## 2021-01-16 DIAGNOSIS — I5032 Chronic diastolic (congestive) heart failure: Secondary | ICD-10-CM | POA: Diagnosis present

## 2021-01-16 LAB — BRAIN NATRIURETIC PEPTIDE: B Natriuretic Peptide: 431.6 pg/mL — ABNORMAL HIGH (ref 0.0–100.0)

## 2021-02-09 NOTE — Progress Notes (Signed)
Patient ID: Colleen Carson, female    DOB: 1925-01-29, 87 y.o.   MRN: [id number]  HPI   Colleen Carson is a 87 y/o female with a history of arthritis, atrial fibrillation, hyperlipidemia, HTN, lymphedema, shingles, CVA and chronic heart failure.  Echo report from 12/12/20 reviewed and showed an EF of >55% along with moderate LAE. Echo report from 11/29/19 reviewed and showed an EF of 40-45% along with mild/moderate MR, moderate/ severe TR and mildly elevated PA pressure. Reviewed echo report done 01/16/18 which showed an EF of 55-65% along with moderate MR and moderate/severe TR. Reviewed echo report done 02/28/17 which showed an EF of 40-45% along with moderate AS, mild MR and mild/mod TR. PA pressure at 31 mm Hg.   Admitted 12/21/20 due to AMS and worsening lymphedema. MRI was obtained which showed chronic microvascular changes as well as bilateral occipital lobe changes consistent with for encephalomalacia. Diuresed but then renal function worsened so diuretics held. Ultrasound negative for DVT. Discharged after 7 days.   Colleen Carson presents today for a follow-up visit with a chief complaint of minimal shortness of breath upon moderate exertion. Colleen Carson describes this as chronic in nature having been present for several years. Colleen Carson has associated fatigue and chronic lymphedema along with this. Colleen Carson denies any difficulty sleeping, dizziness, abdominal distention, palpitations, chest pain, cough or weight gain.   Overall Colleen Carson says that Colleen Carson's feeling well. Colleen Carson tries to wear a light compression sock for a little bit during the day but if Colleen Carson wears it too long, Colleen Carson starts to get short of breath. Colleen Carson also tries to use the compression boots but, again, they can cause her breathing too worsen. Does report the swelling decreases quite a bit overnight after Colleen Carson's had her legs elevated in the bed. Does elevate her legs during the day at times as well. Has been doing some more walking in the home and has finished with PT.   Past Medical  History:  Diagnosis Date  . Arthritis   . Atrial fibrillation (HCC)   . CHF (congestive heart failure) (HCC)   . Dysrhythmia    Afib  . Hypercholesteremia   . Hypertension   . Lymphedema    lower extremities  . S/P total hip arthroplasty 01/25/2015  . Shingles   . Stroke [facility])    noted ischemia on CT scan   Past Surgical History:  Procedure Laterality Date  . JOINT REPLACEMENT  01/23/15   Left THR Dr. Rosita Kea   . PACEMAKER INSERTION  2017  . TOTAL HIP ARTHROPLASTY Left 01/23/2015   Procedure: TOTAL HIP ARTHROPLASTY ANTERIOR APPROACH;  Surgeon: Kennedy Bucker, MD;  Location: [facility] ORS;  Service: Orthopedics;  Laterality: Left;   Family History  Problem Relation Age of Onset  . Hypertension Other    Social History   Tobacco Use  . Smoking status: Never Smoker  . Smokeless tobacco: Never Used  Substance Use Topics  . Alcohol use: No   Allergies  Allergen Reactions  . Clonazepam Other (See Comments)    Heavy sedation  . Morphine Nausea Only and Other (See Comments)    Hallucinations, nausea, vomitting  . Morphine And Related Nausea And Vomiting   Prior to Admission medications   Medication Sig Start Date End Date Taking? Authorizing Provider  apixaban (ELIQUIS) 2.5 MG TABS tablet Take 1 tablet (2.5 mg total) by mouth 2 (two) times daily. 11/08/20 12/08/20 Yes Lynn Ito, MD  atorvastatin (LIPITOR) 40 MG tablet Take 1 tablet (40 mg total)  by mouth at bedtime. 01/16/18  Yes Mody, Patricia Pesa, MD  Cranberry 1000 MG CAPS Take 1 capsule by mouth daily. UTI prevention   Yes [provider]  Multiple Vitamins-Minerals (CENTRUM SILVER 50+WOMEN PO) Take 1 tablet by mouth daily.   Yes [provider]  potassium chloride SA (KLOR-CON) 20 MEQ tablet Take 40 mEq by mouth daily.   Yes [provider]  sertraline (ZOLOFT) 25 MG tablet Take 25 mg by mouth daily.   Yes [provider]  torsemide (DEMADEX) 20 MG tablet Take 1 tablet (20 mg total) by mouth  daily. Patient taking differently: Take 1 tablet daily by mouth or take 2 tablets if weight is increased by >2 pounds 11/13/20 12/13/20 Yes Lurene Shadow, MD    Review of Systems  Constitutional: Positive for fatigue. Negative for appetite change.  HENT: Negative for congestion, postnasal drip and sore throat.   Eyes: Negative.   Respiratory: Positive for shortness of breath. Negative for cough and chest tightness.   Cardiovascular: Positive for leg swelling (worsening). Negative for chest pain and palpitations.  Gastrointestinal: Negative for abdominal distention and abdominal pain.  Endocrine: Negative.   Genitourinary: Negative.   Musculoskeletal: Negative for back pain and neck pain.  Skin: Negative.   Allergic/Immunologic: Negative.   Neurological: Negative for dizziness, weakness and light-headedness.  Hematological: Negative for adenopathy. Does not bruise/bleed easily.  Psychiatric/Behavioral: Negative for dysphoric mood and sleep disturbance (sleeping on 1 pillow). The patient is not nervous/anxious.    Vitals:   02/11/21 0927  BP: (!) 114/57  Pulse: 90  Resp: 18  SpO2: 99%  Weight: 138 lb (62.6 kg)  Height: 5\' 1"  (1.549 m)   Wt Readings from Last 3 Encounters:  02/11/21 138 lb (62.6 kg)  11/08/20 134 lb 7.7 oz (61 kg)  10/18/20 136 lb (61.7 kg)   Lab Results  Component Value Date   CREATININE 1.34 (H) 11/11/2020   CREATININE 1.43 (H) 11/10/2020   CREATININE 1.61 (H) 11/08/2020    Physical Exam Vitals and nursing note reviewed. Exam conducted with a chaperone present (daughter).  Constitutional:      Appearance: Colleen Carson is well-developed.  HENT:     Head: Normocephalic and atraumatic.  Neck:     Vascular: No JVD.  Cardiovascular:     Rate and Rhythm: Normal rate and regular rhythm.  Pulmonary:     Effort: Pulmonary effort is normal.     Breath sounds: No wheezing or rales.  Abdominal:     General: There is no distension.     Palpations: Abdomen is soft.      Tenderness: There is no abdominal tenderness.  Musculoskeletal:        General: No tenderness.     Cervical back: Normal range of motion and neck supple.     Right lower leg: No tenderness. Edema (nonpitting) present.     Left lower leg: No tenderness. Edema (nonpitting) present.  Skin:    General: Skin is warm and dry.  Neurological:     Mental Status: Colleen Carson is alert and oriented to person, place, and time.  Psychiatric:        Behavior: Behavior normal.        Thought Content: Thought content normal.    Assessment & Plan:  1: Chronic heart failure with preserved ejection fraction- - NYHA class II - euvolemic today - weighing daily; reminded to call for an overnight weight gain of >2 pounds or a weekly weight gain of >5 pounds -  weight up 2 pounds from last visit 4 months ago - not adding salt to her food and her daughter is reading food labels and cooking foods from scratch.  - saw cardiologist (Fath) 01/16/21 - BNP 12/21/20 was 217.93 - PharmD reconciled medications with patient and daughter  2: HTN- - BP looks good today  - saw PCP Lalla Brothers) at [facility] 12/07/20 - CMP from 01/16/21 reviewed and showed sodium 137, potassium 4.1, creatinine 1.6 and GFR 30  3: Lymphedema- - stage 2 - does wear light compression socks for short periods of time but if they are too tight or on for too long, Colleen Carson's get short of breath - does elevate her legs when Colleen Carson's sitting for long periods of time with improvement in her edema - unable to wear compression boots as Colleen Carson gets nauseous and hypotensive every time Colleen Carson uses them  - went to lymphedema clinic at [facility] 12/17/20  4: Vascular dementia- - saw [facility] Carl Best) on 01/23/21 - has nutritionist appt on 04/02/21 - audiology appt on 04/15/21   Patient did not bring her medications nor a list. Each medication was verbally reviewed with the patient and Colleen Carson was encouraged to bring the bottles to every visit to confirm accuracy of list.  Due to HF  stability, will not make a return appointment for patient at this time. Advised patient and daughter that they could call back at anytime for any questions or to make another appointment. They were comfortable with this plan.

## 2021-02-11 ENCOUNTER — Encounter: Payer: Self-pay | Admitting: Pharmacist

## 2021-02-11 ENCOUNTER — Other Ambulatory Visit: Payer: Self-pay

## 2021-02-11 ENCOUNTER — Ambulatory Visit: Payer: Medicare Other | Attending: Family | Admitting: Family

## 2021-02-11 ENCOUNTER — Encounter: Payer: Self-pay | Admitting: Family

## 2021-02-11 VITALS — BP 114/57 | HR 90 | Resp 18 | Ht 61.0 in | Wt 138.0 lb

## 2021-02-11 DIAGNOSIS — E785 Hyperlipidemia, unspecified: Secondary | ICD-10-CM | POA: Insufficient documentation

## 2021-02-11 DIAGNOSIS — Z8673 Personal history of transient ischemic attack (TIA), and cerebral infarction without residual deficits: Secondary | ICD-10-CM | POA: Diagnosis not present

## 2021-02-11 DIAGNOSIS — I5032 Chronic diastolic (congestive) heart failure: Secondary | ICD-10-CM | POA: Diagnosis present

## 2021-02-11 DIAGNOSIS — Z950 Presence of cardiac pacemaker: Secondary | ICD-10-CM | POA: Diagnosis not present

## 2021-02-11 DIAGNOSIS — I890 Lymphedema, not elsewhere classified: Secondary | ICD-10-CM | POA: Diagnosis not present

## 2021-02-11 DIAGNOSIS — F0150 Vascular dementia without behavioral disturbance: Secondary | ICD-10-CM | POA: Insufficient documentation

## 2021-02-11 DIAGNOSIS — Z79899 Other long term (current) drug therapy: Secondary | ICD-10-CM | POA: Diagnosis not present

## 2021-02-11 DIAGNOSIS — Z885 Allergy status to narcotic agent status: Secondary | ICD-10-CM | POA: Insufficient documentation

## 2021-02-11 DIAGNOSIS — I10 Essential (primary) hypertension: Secondary | ICD-10-CM

## 2021-02-11 DIAGNOSIS — I110 Hypertensive heart disease with heart failure: Secondary | ICD-10-CM | POA: Insufficient documentation

## 2021-02-11 DIAGNOSIS — Z8249 Family history of ischemic heart disease and other diseases of the circulatory system: Secondary | ICD-10-CM | POA: Diagnosis not present

## 2021-02-11 NOTE — Patient Instructions (Addendum)
Continue weighing daily and call for an overnight weight gain of > 2 pounds or a weekly weight gain of >5 pounds.   Call us in the future if you have any questions or if you'd like to schedule another appointment.

## 2021-02-11 NOTE — Progress Notes (Signed)
[facility] - HEART FAILURE CLINIC - PHARMACIST COUNSELING NOTE  ADHERENCE ASSESSMENT    Do you ever forget to take your medication? [] Yes (1) [x] No (0)  Do you ever skip doses due to side effects? [] Yes (1) [x] No (0)  Do you have trouble affording your medicines? [] Yes (1) [x] No (0)  Are you ever unable to pick up your medication due to transportation difficulties? [] Yes (1) [x] No (0)  Do you ever stop taking your medications because you don't believe they are helping? [] Yes (1) [x] No (0)   Recommendations given to patient about increasing adherence: pt endorses adherence and does not report any barriers to obtaining medications  Guideline-Directed Medical Therapy/Evidence Based Medicine  ACE/ARB/ARNI: None Beta Blocker: previously on metoprolol tartrate - d/c'd 11/2020 due to soft BP Aldosterone Antagonist: None Diuretic: Torsemide    SUBJECTIVE  HPI: 87yo female with PMH of arthritis, afib, HLD, HTN, lymphedema, CVA, and chronic heart failure who presents to the HF clinic for a follow up visit. Pt reports SOB when walking for long periods of time but does not interrupt normal daily activities. Pt is currently on torsemide and takes an additional tablet if her weight increases by more than 2 pounds. Pt checks weight and BP daily: SBP usually 115-130s.   Past Medical History:  Diagnosis Date  . Arthritis   . Atrial fibrillation (HCC)   . CHF (congestive heart failure) (HCC)   . Dysrhythmia    Afib  . Hypercholesteremia   . Hypertension   . Lymphedema    lower extremities  . S/P total hip arthroplasty 01/25/2015  . Shingles   . Stroke [facility])    noted ischemia on CT scan        OBJECTIVE   Vital signs: HR 90, BP 114/57, weight (pounds) 138 ECHO: Date 11/29/2019, EF 45-50%, notes LV no wall motion abnormalities  BMP Latest Ref Rng & Units 11/11/2020 11/10/2020 11/08/2020  Glucose 70 - 99 mg/dL 89 ) -  BUN 8 - 23 mg/dL ) 12/2020) -  Creatinine  0.44 - 1.00 mg/dL 88yo) 03/27/2015) -  Sodium 135 - 145 mmol/L 138 137 -  Potassium 3.5 - 5.1 mmol/L 3.5 3.2(L) 4.5  Chloride 98 - 111 mmol/L 99 95(L) -  CO2 22 - 32 mmol/L 25 26 -  Calcium 8.9 - 10.3 mg/dL 9.5 9.7 -    ASSESSMENT/PLAN Chronic heart failure with preserved ejection fraction -continue torsemide 20mg  daily (40mg  daily if weight increases by >2 pounds) -continue to check weights daily -consider addition of SGLT2i  HTN -continue torsemide as above -continue to monitor BP daily  Afib -continue apixaban 2.5mg  daily  -pt unable to tolerate BB due to soft BP  HLD -continue atorvastatin 40mg  daily   Hypokalemia -11/11/20 K 3.5 -continue KCl 01/29/2020 daily  Depression -continue sertraline 25mg  daily  UTI prevention -continue cranberry 1000mg  daily  Time spent: 10 minutes  11/13/2020, PharmD Pharmacy Resident  02/11/2021 9:46 AM    Current Outpatient Medications:  .  apixaban (ELIQUIS) 2.5 MG TABS tablet, Take 1 tablet (2.5 mg total) by mouth 2 (two) times daily., Disp: 60 tablet, Rfl: 0 .  atorvastatin (LIPITOR) 40 MG tablet, Take 1 tablet (40 mg total) by mouth at bedtime., Disp: , Rfl:  .  Cranberry 1000 MG CAPS, Take 1 capsule by mouth daily. UTI prevention, Disp: , Rfl:  .  Multiple Vitamins-Minerals (CENTRUM SILVER 50+WOMEN PO), Take 1 tablet by mouth daily., Disp: , Rfl:  .  potassium chloride  SA (KLOR-CON) 20 MEQ tablet, Take 40 mEq by mouth daily., Disp: , Rfl:  .  sertraline (ZOLOFT) 25 MG tablet, Take 25 mg by mouth daily., Disp: , Rfl:  .  torsemide (DEMADEX) 20 MG tablet, Take 1 tablet (20 mg total) by mouth daily. (Patient taking differently: Take 1 tablet daily by mouth or take 2 tablets if weight is increased by >2 pounds), Disp: , Rfl:    COUNSELING POINTS/CLINICAL PEARLS Torsemide  Side effects may include excessive urination.  Tell patient to report symptoms of ototoxicity.  Instruct patient to report lightheadedness or syncope.  Warn  patient to avoid use of nonprescription NSAID products without first discussing it with their healthcare provider.  DRUGS TO AVOID IN HEART FAILURE  Drug or Class Mechanism  Analgesics . NSAIDs . COX-2 inhibitors . Glucocorticoids  Sodium and water retention, increased systemic vascular resistance, decreased response to diuretics   Diabetes Medications . Metformin . Thiazolidinediones o Rosiglitazone (Avandia) o Pioglitazone (Actos) . DPP4 Inhibitors o Saxagliptin (Onglyza) o Sitagliptin (Januvia)   Lactic acidosis Possible calcium channel blockade   Unknown  Antiarrhythmics . Class I  o Flecainide o Disopyramide . Class III o Sotalol . Other o Dronedarone  Negative inotrope, proarrhythmic   Proarrhythmic, beta blockade  Negative inotrope  Antihypertensives . Alpha Blockers o Doxazosin . Calcium Channel Blockers o Diltiazem o Verapamil o Nifedipine . Central Alpha Adrenergics o Moxonidine . Peripheral Vasodilators o Minoxidil  Increases renin and aldosterone  Negative inotrope    Possible sympathetic withdrawal  Unknown  Anti-infective . Itraconazole . Amphotericin B  Negative inotrope Unknown  Hematologic . Anagrelide . Cilostazol   Possible inhibition of PD IV Inhibition of PD III causing arrhythmias  Neurologic/Psychiatric . Stimulants . Anti-Seizure Drugs o Carbamazepine o Pregabalin . Antidepressants o Tricyclics o Citalopram . Parkinsons o Bromocriptine o Pergolide o Pramipexole . Antipsychotics o Clozapine . Antimigraine o Ergotamine o Methysergide . Appetite suppressants . Bipolar o Lithium  Peripheral alpha and beta agonist activity  Negative inotrope and chronotrope Calcium channel blockade  Negative inotrope, proarrhythmic Dose-dependent QT prolongation  Excessive serotonin activity/valvular damage Excessive serotonin activity/valvular damage Unknown  IgE mediated hypersensitivy, calcium channel  blockade  Excessive serotonin activity/valvular damage Excessive serotonin activity/valvular damage Valvular damage  Direct myofibrillar degeneration, adrenergic stimulation  Antimalarials . Chloroquine . Hydroxychloroquine Intracellular inhibition of lysosomal enzymes  Urologic Agents . Alpha Blockers o Doxazosin o Prazosin o Tamsulosin o Terazosin  Increased renin and aldosterone  Adapted from Page RL, et al. "Drugs That May Cause or Exacerbate Heart Failure: A Scientific Statement from the American Heart  Association." Circulation 2016; 134:e32-e69. DOI: 10.1161/CIR.0000000000000426   MEDICATION ADHERENCES TIPS AND STRATEGIES 1. Taking medication as prescribed improves patient outcomes in heart failure (reduces hospitalizations, improves symptoms, increases survival) 2. Side effects of medications can be managed by decreasing doses, switching agents, stopping drugs, or adding additional therapy. Please let someone in the Heart Failure Clinic know if you have having bothersome side effects so we can modify your regimen. Do not alter your medication regimen without talking to [place].  3. Medication reminders can help patients remember to take drugs on time. If you are missing or forgetting doses you can try linking behaviors, using pill boxes, or an electronic reminder like an alarm on your phone or an app. Some people can also get automated phone calls as medication reminders.

## 2021-02-16 ENCOUNTER — Other Ambulatory Visit: Payer: Self-pay | Admitting: Family

## 2021-04-15 ENCOUNTER — Other Ambulatory Visit: Payer: Self-pay

## 2021-04-15 ENCOUNTER — Ambulatory Visit (INDEPENDENT_AMBULATORY_CARE_PROVIDER_SITE_OTHER): Payer: Medicare Other | Admitting: Vascular Surgery

## 2021-04-15 VITALS — BP 121/73 | HR 86 | Ht 61.0 in | Wt 139.0 lb

## 2021-04-15 DIAGNOSIS — I5032 Chronic diastolic (congestive) heart failure: Secondary | ICD-10-CM

## 2021-04-15 DIAGNOSIS — I480 Paroxysmal atrial fibrillation: Secondary | ICD-10-CM

## 2021-04-15 DIAGNOSIS — I10 Essential (primary) hypertension: Secondary | ICD-10-CM

## 2021-04-15 DIAGNOSIS — I890 Lymphedema, not elsewhere classified: Secondary | ICD-10-CM

## 2021-04-15 DIAGNOSIS — E785 Hyperlipidemia, unspecified: Secondary | ICD-10-CM

## 2021-04-16 ENCOUNTER — Encounter (INDEPENDENT_AMBULATORY_CARE_PROVIDER_SITE_OTHER): Payer: Self-pay | Admitting: Vascular Surgery

## 2021-04-16 NOTE — Progress Notes (Signed)
MRN : [id number]  Colleen Carson is a 87 y.o. ([birth year]) female who presents with chief complaint of  Chief Complaint  Patient presents with   New Patient (Initial Visit)    NP fath. Lymphedema   .  History of Present Illness:   Patient is seen for evaluation of leg swelling. The patient first noticed the swelling remotely but is now concerned because of an increase in the overall edema. The swelling is associated with some pain and minimal discoloration. The patient notes that in the morning the legs are somewhat improved but they steadily worsened throughout the course of the day. Elevation makes the legs better, dependency makes them much worse.   There is no history of ulcerations associated with the swelling.   The patient denies any recent changes in their medications.  The patient has not been wearing graduated compression consistently because they were very uncomfortable.  The patient has not had any past angiography, interventions or vascular surgery.  The patient denies a history of DVT or PE. There is no prior history of phlebitis. There is no history of primary lymphedema.  There is no history of radiation treatment to the groin or pelvis No history of malignancies. No history of trauma or groin or pelvic surgery. No history of foreign travel or parasitic infections area    Current Meds  Medication Sig   apixaban (ELIQUIS) 2.5 MG TABS tablet Take 1 tablet (2.5 mg total) by mouth 2 (two) times daily.   atorvastatin (LIPITOR) 40 MG tablet Take 1 tablet (40 mg total) by mouth at bedtime.   Cranberry 1000 MG CAPS Take 1 capsule by mouth daily. UTI prevention   EUTHYROX 25 MCG tablet Take 25 mcg by mouth daily.   Multiple Vitamins-Minerals (CENTRUM SILVER 50+WOMEN PO) Take 1 tablet by mouth daily.   potassium chloride SA (KLOR-CON) 20 MEQ tablet Take 40 mEq by mouth daily.   senna (SENOKOT) 8.6 MG tablet Take 2 tablets by mouth at bedtime.   sertraline (ZOLOFT) 25  MG tablet Take 25 mg by mouth daily.   torsemide (DEMADEX) 20 MG tablet Take 1 tablet daily by mouth or take 2 tablets if weight is increased by >2 pounds    Past Medical History:  Diagnosis Date   Arthritis    Atrial fibrillation (HCC)    CHF (congestive heart failure) (HCC)    Dysrhythmia    Afib   Hypercholesteremia    Hypertension    Lymphedema    lower extremities   S/P total hip arthroplasty 01/25/2015   Shingles    Stroke [facility])    noted ischemia on CT scan    Past Surgical History:  Procedure Laterality Date   JOINT REPLACEMENT  01/23/15   Left THR Dr. Rosita Kea    PACEMAKER INSERTION  2017   TOTAL HIP ARTHROPLASTY Left 01/23/2015   Procedure: TOTAL HIP ARTHROPLASTY ANTERIOR APPROACH;  Surgeon: Kennedy Bucker, MD;  Location: [facility] ORS;  Service: Orthopedics;  Laterality: Left;    Social History Social History   Tobacco Use   Smoking status: Never   Smokeless tobacco: Never  Vaping Use   Vaping Use: Never used  Substance Use Topics   Alcohol use: No   Drug use: No    Family History Family History  Problem Relation Age of Onset   Hypertension Other   No family history of bleeding/clotting disorders, porphyria or autoimmune disease   Allergies  Allergen Reactions   Clonazepam Other (See Comments)  Heavy sedation   Morphine Nausea Only and Other (See Comments)    Hallucinations, nausea, vomitting   Morphine And Related Nausea And Vomiting     REVIEW OF SYSTEMS (Negative unless checked)  Constitutional: [] Weight loss  [] Fever  [] Chills Cardiac: [] Chest pain   [] Chest pressure   [] Palpitations   [] Shortness of breath when laying flat   [] Shortness of breath with exertion. Vascular:  [] Pain in legs with walking   [x] Pain in legs at rest  [] History of DVT   [] Phlebitis   [x] Swelling in legs   [] Varicose veins   [] Non-healing ulcers Pulmonary:   [] Uses home oxygen   [] Productive cough   [] Hemoptysis   [] Wheeze  [] COPD   [] Asthma Neurologic:  [] Dizziness   [] Seizures    [] History of stroke   [] History of TIA  [] Aphasia   [] Vissual changes   [] Weakness or numbness in arm   [] Weakness or numbness in leg Musculoskeletal:   [] Joint swelling   [] Joint pain   [] Low back pain Hematologic:  [] Easy bruising  [] Easy bleeding   [] Hypercoagulable state   [] Anemic Gastrointestinal:  [] Diarrhea   [] Vomiting  [] Gastroesophageal reflux/heartburn   [] Difficulty swallowing. Genitourinary:  [] Chronic kidney disease   [] Difficult urination  [] Frequent urination   [] Blood in urine Skin:  [] Rashes   [] Ulcers  Psychological:  [] History of anxiety   []  History of major depression.  Physical Examination  Vitals:   04/15/21 1113  BP: 121/73  Pulse: 86  Weight: 139 lb (63 kg)  Height: 5\' 1"  (1.549 m)   Body mass index is 26.26 kg/m. Gen: WD/WN, NAD Head: [place]/AT, No temporalis wasting.  Ear/Nose/Throat: Hearing grossly intact, nares w/o erythema or drainage, poor dentition Eyes: PER, EOMI, sclera nonicteric.  Neck: Supple, no masses.  No bruit or JVD.  Pulmonary:  Good air movement, clear to auscultation bilaterally, no use of accessory muscles.  Cardiac: RRR, normal S1, S2, no Murmurs. Vascular:  scattered varicosities present bilaterally.  Mild venous stasis changes to the legs bilaterally.  4+ soft pitting edema which extends to the dorsum of both feet. Vessel Right Left  Radial Palpable Palpable  Gastrointestinal: soft, non-distended. No guarding/no peritoneal signs.  Musculoskeletal: M/S 5/5 throughout.  No deformity or atrophy.  Neurologic: CN 2-12 intact. Pain and light touch intact in extremities.  Symmetrical.  Speech is fluent. Motor exam as listed above. Psychiatric: Judgment intact, Mood & affect appropriate for pt's clinical situation. Dermatologic: Mild venous rashes no ulcers noted.  No changes consistent with cellulitis. Lymph : No lichenification or skin changes of chronic lymphedema.  CBC Lab Results  Component Value Date   WBC 5.6 11/13/2020   HGB  13.3 11/13/2020   HCT 39.5 11/13/2020   MCV 87.6 11/13/2020   PLT 108 (L) 11/13/2020    BMET    Component Value Date/Time   NA 138 11/11/2020 0424   NA 138 01/10/2015 1349   K 3.5 11/11/2020 0424   K 4.1 01/10/2015 1349   CL 99 11/11/2020 0424   CL 98 (L) 01/10/2015 1349   CO2 25 11/11/2020 0424   CO2 29 01/10/2015 1349   GLUCOSE 89 11/11/2020 0424   GLUCOSE 159 (H) 01/10/2015 1349   BUN 36 (H) 11/11/2020 0424   BUN 30 (H) 01/10/2015 1349   CREATININE 1.34 (H) 11/11/2020 0424   CREATININE 1.25 (H) 01/10/2015 1349   CALCIUM 9.5 11/11/2020 0424   CALCIUM 9.3 01/10/2015 1349   GFRNONAA 37 (L) 11/11/2020 0424   GFRNONAA 39 (L) 01/10/2015  1349   GFRAA 45 (L) 04/09/2020 0238   GFRAA 45 (L) 01/10/2015 1349   CrCl cannot be calculated (Patient's most recent lab result is older than the maximum 21 days allowed.).  COAG Lab Results  Component Value Date   INR 1.2 11/28/2019   INR 1.02 01/15/2018   INR 0.96 12/24/2016    Radiology No results found.   Assessment/Plan 1. Lymphedema Recommend:  No surgery or intervention at this point in time.    I have reviewed my previous discussion with the patient regarding swelling and why it causes symptoms.  Patient will try wearing graduated compression wraps on a daily basis. The patient will  beginning wearing the wraps first thing in the morning and removing them in the evening. The patient is instructed specifically not to sleep in the stockings.    In addition, behavioral modification including several periods of elevation of the lower extremities during the day will be continued.  This was reviewed with the patient during the initial visit.  The patient will also continue routine exercise, especially walking on a daily basis as was discussed during the initial visit.    Despite conservative treatments including graduated compression therapy class 1 and behavioral modification including exercise and elevation the patient  has  not obtained adequate control of the lymphedema.  The patient still has stage 3 lymphedema and therefore, I believe that a lymph pump should be added to improve the control of the patient's lymphedema.  Additionally, a lymph pump is warranted because it will reduce the risk of cellulitis and ulceration in the future.  Patient should follow-up in 3 months    2. Chronic diastolic heart failure (HCC) Continue cardiac and antihypertensive medications as already ordered and reviewed, no changes at this time.  Continue statin as ordered and reviewed, no changes at this time  Nitrates PRN for chest pain   3. Primary hypertension Continue antihypertensive medications as already ordered, these medications have been reviewed and there are no changes at this time.   4. Paroxysmal atrial fibrillation (HCC) Continue antiarrhythmia medications as already ordered, these medications have been reviewed and there are no changes at this time.  Continue anticoagulation as ordered by Cardiology Service   5. Hyperlipidemia, unspecified hyperlipidemia type Continue statin as ordered and reviewed, no changes at this time     Levora Dredge, MD  04/16/2021 2:03 PM

## 2021-04-17 ENCOUNTER — Telehealth (INDEPENDENT_AMBULATORY_CARE_PROVIDER_SITE_OTHER): Payer: Self-pay

## 2021-04-17 NOTE — Telephone Encounter (Signed)
Pt's daughter called and left a VM on the nurses line saying her mom's Lt LE is swollen an weeping per the NP she needs to come in for Lt LE unna boot . Please schedule with the daughter.

## 2021-04-18 NOTE — Telephone Encounter (Signed)
Called and scheduled patient

## 2021-04-19 ENCOUNTER — Ambulatory Visit (INDEPENDENT_AMBULATORY_CARE_PROVIDER_SITE_OTHER): Payer: Medicare Other | Admitting: Nurse Practitioner

## 2021-04-19 ENCOUNTER — Encounter (INDEPENDENT_AMBULATORY_CARE_PROVIDER_SITE_OTHER): Payer: Self-pay | Admitting: Nurse Practitioner

## 2021-04-19 ENCOUNTER — Other Ambulatory Visit: Payer: Self-pay

## 2021-04-19 VITALS — BP 114/67 | HR 82 | Resp 16 | Ht 61.0 in | Wt 141.0 lb

## 2021-04-19 DIAGNOSIS — I890 Lymphedema, not elsewhere classified: Secondary | ICD-10-CM | POA: Diagnosis not present

## 2021-04-19 NOTE — Progress Notes (Signed)
History of Present Illness  There is no documented history at this time  Assessments & Plan   There are no diagnoses linked to this encounter.    Additional instructions  Subjective:  Patient presents with venous ulcer of the Bilateral lower extremity.    Procedure:  3 layer unna wrap was placed Bilateral lower extremity.   Plan:   Follow up in one week.  

## 2021-04-26 ENCOUNTER — Encounter (INDEPENDENT_AMBULATORY_CARE_PROVIDER_SITE_OTHER): Payer: Self-pay

## 2021-04-26 ENCOUNTER — Ambulatory Visit (INDEPENDENT_AMBULATORY_CARE_PROVIDER_SITE_OTHER): Payer: Medicare Other | Admitting: Nurse Practitioner

## 2021-04-26 ENCOUNTER — Other Ambulatory Visit: Payer: Self-pay

## 2021-04-26 VITALS — BP 131/75 | HR 74 | Resp 16

## 2021-04-26 DIAGNOSIS — I890 Lymphedema, not elsewhere classified: Secondary | ICD-10-CM | POA: Diagnosis not present

## 2021-04-26 NOTE — Progress Notes (Signed)
History of Present Illness  There is no documented history at this time  Assessments & Plan   There are no diagnoses linked to this encounter.    Additional instructions  Subjective:  Patient presents with venous ulcer of the Bilateral lower extremity.    Procedure:  3 layer unna wrap was placed Bilateral lower extremity.   Plan:   Follow up in one week.  

## 2021-04-29 ENCOUNTER — Telehealth (INDEPENDENT_AMBULATORY_CARE_PROVIDER_SITE_OTHER): Payer: Self-pay

## 2021-04-29 ENCOUNTER — Other Ambulatory Visit: Payer: Self-pay

## 2021-04-29 ENCOUNTER — Encounter (INDEPENDENT_AMBULATORY_CARE_PROVIDER_SITE_OTHER): Payer: Medicare Other | Admitting: Nurse Practitioner

## 2021-04-29 NOTE — Telephone Encounter (Signed)
The pts daughter Raynelle Fanning called and left a VM on the nurse's line saying that her mom's unna boots were starting to roll down. I called the daughter and spoke to her and she said  that her mom wasn't in any pain she did how ever have some swelling I made aware that she could bring her mom in today at 2:00 PM to be re-wrapped she agreed an voiced understanding. Please scheduled the pt to have BIL Unna boots today 04/29/2021

## 2021-04-29 NOTE — Telephone Encounter (Signed)
PATIENT SCHEDULED  °

## 2021-05-03 ENCOUNTER — Encounter (INDEPENDENT_AMBULATORY_CARE_PROVIDER_SITE_OTHER): Payer: Medicare Other

## 2021-05-05 ENCOUNTER — Encounter (INDEPENDENT_AMBULATORY_CARE_PROVIDER_SITE_OTHER): Payer: Self-pay | Admitting: Nurse Practitioner

## 2021-06-03 ENCOUNTER — Telehealth (INDEPENDENT_AMBULATORY_CARE_PROVIDER_SITE_OTHER): Payer: Self-pay

## 2021-06-03 NOTE — Telephone Encounter (Signed)
Patient daughter was made aware with medical advice and verbalized understanding. ?

## 2021-06-03 NOTE — Telephone Encounter (Signed)
That is not a typical response for the lymphedema pump.  Typically for patient to put on 4 pounds overnight that is usually some sort of fluid.  The lymphedema pump may shift fluid but it does not necessarily cause her mother to gain fluid.  Because of this it would be best for her to either contact her PCP or her cardiologist because even if she is taking furosemide sometimes the dosages need to be switched if she is gaining fluid.  I will hold off on using the lymphedema pump until this is settled.

## 2021-07-18 ENCOUNTER — Ambulatory Visit (INDEPENDENT_AMBULATORY_CARE_PROVIDER_SITE_OTHER): Payer: Medicare Other | Admitting: Vascular Surgery

## 2021-07-23 DEATH — deceased

## 2021-07-29 ENCOUNTER — Ambulatory Visit (INDEPENDENT_AMBULATORY_CARE_PROVIDER_SITE_OTHER): Payer: Medicare Other | Admitting: Vascular Surgery

## 2022-07-21 ENCOUNTER — Encounter (INDEPENDENT_AMBULATORY_CARE_PROVIDER_SITE_OTHER): Payer: Self-pay

## 2022-07-30 NOTE — Telephone Encounter (Signed)
Error
